# Patient Record
Sex: Male | Born: 1955
Health system: Southern US, Community
[De-identification: ages and names within clinical notes are randomized; demographics above are authoritative.]

## PROBLEM LIST (undated history)

## (undated) DIAGNOSIS — I5042 Chronic combined systolic (congestive) and diastolic (congestive) heart failure: Secondary | ICD-10-CM

## (undated) DIAGNOSIS — K259 Gastric ulcer, unspecified as acute or chronic, without hemorrhage or perforation: Secondary | ICD-10-CM

## (undated) DIAGNOSIS — Z9861 Coronary angioplasty status: Secondary | ICD-10-CM

## (undated) DIAGNOSIS — I255 Ischemic cardiomyopathy: Secondary | ICD-10-CM

## (undated) DIAGNOSIS — I219 Acute myocardial infarction, unspecified: Secondary | ICD-10-CM

## (undated) DIAGNOSIS — E7800 Pure hypercholesterolemia, unspecified: Secondary | ICD-10-CM

## (undated) DIAGNOSIS — R12 Heartburn: Secondary | ICD-10-CM

## (undated) DIAGNOSIS — J449 Chronic obstructive pulmonary disease, unspecified: Secondary | ICD-10-CM

## (undated) DIAGNOSIS — I2119 ST elevation (STEMI) myocardial infarction involving other coronary artery of inferior wall: Secondary | ICD-10-CM

## (undated) DIAGNOSIS — Z720 Tobacco use: Secondary | ICD-10-CM

## (undated) DIAGNOSIS — I4891 Unspecified atrial fibrillation: Principal | ICD-10-CM

## (undated) DIAGNOSIS — F419 Anxiety disorder, unspecified: Secondary | ICD-10-CM

## (undated) DIAGNOSIS — I2510 Atherosclerotic heart disease of native coronary artery without angina pectoris: Secondary | ICD-10-CM

## (undated) DIAGNOSIS — K635 Polyp of colon: Secondary | ICD-10-CM

## (undated) DIAGNOSIS — J45909 Unspecified asthma, uncomplicated: Secondary | ICD-10-CM

## (undated) DIAGNOSIS — I10 Essential (primary) hypertension: Secondary | ICD-10-CM

## (undated) DIAGNOSIS — I519 Heart disease, unspecified: Secondary | ICD-10-CM

## (undated) DIAGNOSIS — E785 Hyperlipidemia, unspecified: Secondary | ICD-10-CM

## (undated) HISTORY — DX: Pure hypercholesterolemia, unspecified: E78.00

## (undated) HISTORY — DX: Unspecified asthma, uncomplicated: J45.909

## (undated) HISTORY — DX: Essential (primary) hypertension: I10

## (undated) HISTORY — DX: Anxiety disorder, unspecified: F41.9

## (undated) HISTORY — PX: HERNIA REPAIR: SHX51

## (undated) HISTORY — DX: Coronary angioplasty status: Z98.61

## (undated) HISTORY — DX: Unspecified atrial fibrillation: I48.91

## (undated) HISTORY — DX: Heartburn: R12

## (undated) HISTORY — DX: Heart disease, unspecified: I51.9

## (undated) HISTORY — DX: Chronic combined systolic (congestive) and diastolic (congestive) heart failure: I50.42

## (undated) HISTORY — PX: TOE SURGERY: SHX1073

## (undated) HISTORY — DX: ST elevation (STEMI) myocardial infarction involving other coronary artery of inferior wall: I21.19

## (undated) HISTORY — DX: Hyperlipidemia, unspecified: E78.5

## (undated) HISTORY — PX: OTHER SURGICAL HISTORY: SHX169

## (undated) HISTORY — DX: Atherosclerotic heart disease of native coronary artery without angina pectoris: I25.10

## (undated) HISTORY — DX: Gastric ulcer, unspecified as acute or chronic, without hemorrhage or perforation: K25.9

## (undated) HISTORY — DX: Acute myocardial infarction, unspecified: I21.9

## (undated) HISTORY — DX: Ischemic cardiomyopathy: I25.5

## (undated) HISTORY — PX: TONSILLECTOMY: SUR1361

## (undated) HISTORY — DX: Polyp of colon: K63.5

---

## 2011-06-08 ENCOUNTER — Ambulatory Visit (HOSPITAL_COMMUNITY)
Admission: RE | Admit: 2011-06-08 | Discharge: 2011-06-08 | Disposition: A | Discharge status: Home / Self Care | Payer: BC Managed Care – PPO | Source: Ambulatory Visit | Attending: Family Medicine | Admitting: Family Medicine

## 2011-06-08 ENCOUNTER — Other Ambulatory Visit (HOSPITAL_COMMUNITY): Payer: Self-pay | Admitting: Family Medicine

## 2011-06-08 DIAGNOSIS — J441 Chronic obstructive pulmonary disease with (acute) exacerbation: Secondary | ICD-10-CM

## 2011-09-18 ENCOUNTER — Ambulatory Visit (HOSPITAL_COMMUNITY)
Admission: RE | Admit: 2011-09-18 | Discharge: 2011-09-18 | Disposition: A | Discharge status: Home / Self Care | Payer: BC Managed Care – PPO | Source: Ambulatory Visit | Attending: Family Medicine | Admitting: Family Medicine

## 2011-09-18 ENCOUNTER — Other Ambulatory Visit (HOSPITAL_COMMUNITY): Payer: Self-pay | Admitting: Family Medicine

## 2011-09-18 DIAGNOSIS — J069 Acute upper respiratory infection, unspecified: Secondary | ICD-10-CM

## 2011-09-18 DIAGNOSIS — R05 Cough: Secondary | ICD-10-CM | POA: Insufficient documentation

## 2011-09-18 DIAGNOSIS — R0989 Other specified symptoms and signs involving the circulatory and respiratory systems: Secondary | ICD-10-CM | POA: Insufficient documentation

## 2011-09-18 DIAGNOSIS — R059 Cough, unspecified: Secondary | ICD-10-CM | POA: Insufficient documentation

## 2011-09-18 DIAGNOSIS — J439 Emphysema, unspecified: Secondary | ICD-10-CM | POA: Insufficient documentation

## 2012-11-17 ENCOUNTER — Ambulatory Visit (HOSPITAL_COMMUNITY)
Admission: EM | Admit: 2012-11-17 | Payer: BC Managed Care – PPO | Source: Ambulatory Visit | Admitting: Cardiovascular Disease

## 2012-11-17 ENCOUNTER — Encounter (HOSPITAL_COMMUNITY): Payer: Self-pay | Admitting: Physician Assistant

## 2012-11-17 ENCOUNTER — Inpatient Hospital Stay (HOSPITAL_COMMUNITY): Payer: BC Managed Care – PPO

## 2012-11-17 ENCOUNTER — Inpatient Hospital Stay (HOSPITAL_COMMUNITY)
Admission: EM | Admit: 2012-11-17 | Discharge: 2012-11-22 | DRG: 248 | Disposition: A | Discharge status: Home / Self Care | Payer: BC Managed Care – PPO | Source: Ambulatory Visit | Attending: Cardiology | Admitting: Cardiology

## 2012-11-17 ENCOUNTER — Encounter (HOSPITAL_COMMUNITY)
Admission: EM | Disposition: A | Discharge status: Home / Self Care | Payer: Self-pay | Source: Ambulatory Visit | Attending: Cardiology

## 2012-11-17 DIAGNOSIS — I219 Acute myocardial infarction, unspecified: Secondary | ICD-10-CM

## 2012-11-17 DIAGNOSIS — I2119 ST elevation (STEMI) myocardial infarction involving other coronary artery of inferior wall: Principal | ICD-10-CM | POA: Diagnosis present

## 2012-11-17 DIAGNOSIS — I5042 Chronic combined systolic (congestive) and diastolic (congestive) heart failure: Secondary | ICD-10-CM

## 2012-11-17 DIAGNOSIS — I4892 Unspecified atrial flutter: Secondary | ICD-10-CM | POA: Diagnosis not present

## 2012-11-17 DIAGNOSIS — Z87891 Personal history of nicotine dependence: Secondary | ICD-10-CM | POA: Diagnosis present

## 2012-11-17 DIAGNOSIS — Z7982 Long term (current) use of aspirin: Secondary | ICD-10-CM

## 2012-11-17 DIAGNOSIS — R570 Cardiogenic shock: Secondary | ICD-10-CM | POA: Diagnosis present

## 2012-11-17 DIAGNOSIS — I252 Old myocardial infarction: Secondary | ICD-10-CM

## 2012-11-17 DIAGNOSIS — Z23 Encounter for immunization: Secondary | ICD-10-CM

## 2012-11-17 DIAGNOSIS — I4891 Unspecified atrial fibrillation: Secondary | ICD-10-CM | POA: Diagnosis not present

## 2012-11-17 DIAGNOSIS — D72829 Elevated white blood cell count, unspecified: Secondary | ICD-10-CM | POA: Diagnosis present

## 2012-11-17 DIAGNOSIS — F17200 Nicotine dependence, unspecified, uncomplicated: Secondary | ICD-10-CM | POA: Diagnosis present

## 2012-11-17 DIAGNOSIS — Z79899 Other long term (current) drug therapy: Secondary | ICD-10-CM

## 2012-11-17 DIAGNOSIS — I5032 Chronic diastolic (congestive) heart failure: Secondary | ICD-10-CM | POA: Insufficient documentation

## 2012-11-17 DIAGNOSIS — I2510 Atherosclerotic heart disease of native coronary artery without angina pectoris: Secondary | ICD-10-CM | POA: Diagnosis present

## 2012-11-17 DIAGNOSIS — F17210 Nicotine dependence, cigarettes, uncomplicated: Secondary | ICD-10-CM

## 2012-11-17 DIAGNOSIS — J189 Pneumonia, unspecified organism: Secondary | ICD-10-CM | POA: Diagnosis present

## 2012-11-17 DIAGNOSIS — E1169 Type 2 diabetes mellitus with other specified complication: Secondary | ICD-10-CM | POA: Diagnosis present

## 2012-11-17 DIAGNOSIS — I480 Paroxysmal atrial fibrillation: Secondary | ICD-10-CM | POA: Diagnosis not present

## 2012-11-17 DIAGNOSIS — J441 Chronic obstructive pulmonary disease with (acute) exacerbation: Secondary | ICD-10-CM | POA: Diagnosis present

## 2012-11-17 DIAGNOSIS — J449 Chronic obstructive pulmonary disease, unspecified: Secondary | ICD-10-CM | POA: Diagnosis present

## 2012-11-17 DIAGNOSIS — E785 Hyperlipidemia, unspecified: Secondary | ICD-10-CM | POA: Diagnosis present

## 2012-11-17 HISTORY — DX: Chronic combined systolic (congestive) and diastolic (congestive) heart failure: I50.42

## 2012-11-17 HISTORY — PX: CORONARY ANGIOPLASTY WITH STENT PLACEMENT: SHX49

## 2012-11-17 HISTORY — DX: ST elevation (STEMI) myocardial infarction involving other coronary artery of inferior wall: I21.19

## 2012-11-17 HISTORY — DX: Chronic obstructive pulmonary disease, unspecified: J44.9

## 2012-11-17 HISTORY — PX: LEFT HEART CATHETERIZATION WITH CORONARY ANGIOGRAM: SHX5451

## 2012-11-17 HISTORY — DX: Tobacco use: Z72.0

## 2012-11-17 LAB — POCT I-STAT, CHEM 8
BUN: 13 mg/dL (ref 6–23)
Calcium, Ion: 1.03 mmol/L — ABNORMAL LOW (ref 1.12–1.23)
Chloride: 81 mEq/L — ABNORMAL LOW (ref 96–112)
Creatinine, Ser: 0.5 mg/dL (ref 0.50–1.35)
Glucose, Bld: 73 mg/dL (ref 70–99)
HCT: 39 % (ref 39.0–52.0)
Potassium: 3.4 mEq/L — ABNORMAL LOW (ref 3.5–5.1)

## 2012-11-17 LAB — COMPREHENSIVE METABOLIC PANEL
ALT: 26 U/L (ref 0–53)
AST: 102 U/L — ABNORMAL HIGH (ref 0–37)
Albumin: 2.5 g/dL — ABNORMAL LOW (ref 3.5–5.2)
Alkaline Phosphatase: 113 U/L (ref 39–117)
BUN: 14 mg/dL (ref 6–23)
CO2: 27 mEq/L (ref 19–32)
Chloride: 94 mEq/L — ABNORMAL LOW (ref 96–112)
Creatinine, Ser: 0.72 mg/dL (ref 0.50–1.35)
Potassium: 3.7 mEq/L (ref 3.5–5.1)
Sodium: 135 mEq/L (ref 135–145)
Total Bilirubin: 0.4 mg/dL (ref 0.3–1.2)

## 2012-11-17 LAB — CBC WITH DIFFERENTIAL/PLATELET
Basophils Relative: 0 % (ref 0–1)
Lymphs Abs: 0.9 10*3/uL (ref 0.7–4.0)
MCH: 30.1 pg (ref 26.0–34.0)
MCHC: 33.6 g/dL (ref 30.0–36.0)
MCV: 89.7 fL (ref 78.0–100.0)
Monocytes Absolute: 0.7 10*3/uL (ref 0.1–1.0)
Monocytes Relative: 6 % (ref 3–12)
Neutro Abs: 9.2 10*3/uL — ABNORMAL HIGH (ref 1.7–7.7)
Platelets: 391 10*3/uL (ref 150–400)
RDW: 13.5 % (ref 11.5–15.5)
WBC: 10.8 10*3/uL — ABNORMAL HIGH (ref 4.0–10.5)

## 2012-11-17 LAB — PROTIME-INR: Prothrombin Time: 17.3 seconds — ABNORMAL HIGH (ref 11.6–15.2)

## 2012-11-17 LAB — BASIC METABOLIC PANEL
BUN: 14 mg/dL (ref 6–23)
CO2: 27 mEq/L (ref 19–32)
Chloride: 93 mEq/L — ABNORMAL LOW (ref 96–112)
Glucose, Bld: 97 mg/dL (ref 70–99)
Potassium: 4.2 mEq/L (ref 3.5–5.1)
Sodium: 131 mEq/L — ABNORMAL LOW (ref 135–145)

## 2012-11-17 LAB — HEMOGLOBIN A1C
Hgb A1c MFr Bld: 6.7 % — ABNORMAL HIGH (ref <5.7)
Mean Plasma Glucose: 146 mg/dL — ABNORMAL HIGH (ref <117)

## 2012-11-17 LAB — MRSA PCR SCREENING: MRSA by PCR: NEGATIVE

## 2012-11-17 LAB — PRO B NATRIURETIC PEPTIDE: Pro B Natriuretic peptide (BNP): 718.1 pg/mL — ABNORMAL HIGH (ref 0–125)

## 2012-11-17 SURGERY — LEFT HEART CATHETERIZATION WITH CORONARY ANGIOGRAM
Anesthesia: LOCAL

## 2012-11-17 MED ORDER — SODIUM CHLORIDE 0.9 % IJ SOLN: 3.0000 mL | INTRAMUSCULAR | Status: DC | PRN

## 2012-11-17 MED ORDER — SODIUM CHLORIDE 0.9 % IV SOLN: 250.0000 mL | INTRAVENOUS | Status: DC | PRN

## 2012-11-17 MED ORDER — ACETAMINOPHEN 325 MG PO TABS: 650.0000 mg | ORAL_TABLET | ORAL | Status: DC | PRN

## 2012-11-17 MED ORDER — ONDANSETRON HCL 4 MG/2ML IJ SOLN: 4.0000 mg | Freq: Four times a day (QID) | INTRAMUSCULAR | Status: DC | PRN

## 2012-11-17 MED ORDER — TIOTROPIUM BROMIDE MONOHYDRATE 18 MCG IN CAPS
18.0000 ug | ORAL_CAPSULE | Freq: Every day | RESPIRATORY_TRACT | Status: DC
  Filled 2012-11-17: qty 5

## 2012-11-17 MED ORDER — ALPRAZOLAM 0.25 MG PO TABS
0.2500 mg | ORAL_TABLET | Freq: Two times a day (BID) | ORAL | Status: DC | PRN
  Administered 2012-11-17: 0.25 mg via ORAL
  Filled 2012-11-17: qty 1

## 2012-11-17 MED ORDER — ENOXAPARIN SODIUM 40 MG/0.4ML ~~LOC~~ SOLN
40.0000 mg | SUBCUTANEOUS | Status: DC
Start: 2012-11-18 — End: 2012-11-19
  Administered 2012-11-18 – 2012-11-19 (×2): 40 mg via SUBCUTANEOUS
  Filled 2012-11-17 (×3): qty 0.4

## 2012-11-17 MED ORDER — ASPIRIN EC 81 MG PO TBEC
81.0000 mg | DELAYED_RELEASE_TABLET | Freq: Every day | ORAL | Status: DC
  Administered 2012-11-19 – 2012-11-22 (×4): 81 mg via ORAL
  Filled 2012-11-17 (×6): qty 1

## 2012-11-17 MED ORDER — SODIUM CHLORIDE 0.9 % IV SOLN
INTRAVENOUS | Status: AC
  Administered 2012-11-17: 18:00:00 via INTRAVENOUS

## 2012-11-17 MED ORDER — FUROSEMIDE 10 MG/ML IJ SOLN
40.0000 mg | Freq: Once | INTRAMUSCULAR | Status: DC
  Filled 2012-11-17: qty 4

## 2012-11-17 MED ORDER — INFLUENZA VAC SPLIT QUAD 0.5 ML IM SUSP
0.5000 mL | INTRAMUSCULAR | Status: AC
  Filled 2012-11-17: qty 0.5

## 2012-11-17 MED ORDER — EPTIFIBATIDE 75 MG/100ML IV SOLN
INTRAVENOUS | Status: AC
  Filled 2012-11-17: qty 100

## 2012-11-17 MED ORDER — NITROGLYCERIN 0.4 MG SL SUBL: 0.4000 mg | SUBLINGUAL_TABLET | SUBLINGUAL | Status: DC | PRN

## 2012-11-17 MED ORDER — ACETAMINOPHEN 325 MG PO TABS
650.0000 mg | ORAL_TABLET | ORAL | Status: DC | PRN
  Administered 2012-11-18 – 2012-11-20 (×2): 650 mg via ORAL
  Filled 2012-11-17 (×2): qty 2

## 2012-11-17 MED ORDER — SODIUM CHLORIDE 0.9 % IJ SOLN
3.0000 mL | Freq: Two times a day (BID) | INTRAMUSCULAR | Status: DC
  Administered 2012-11-17 – 2012-11-22 (×9): 3 mL via INTRAVENOUS

## 2012-11-17 MED ORDER — NICOTINE 14 MG/24HR TD PT24
14.0000 mg | MEDICATED_PATCH | Freq: Every day | TRANSDERMAL | Status: DC
  Administered 2012-11-17 – 2012-11-22 (×6): 14 mg via TRANSDERMAL
  Filled 2012-11-17 (×6): qty 1

## 2012-11-17 MED ORDER — METOPROLOL TARTRATE 25 MG PO TABS
25.0000 mg | ORAL_TABLET | Freq: Two times a day (BID) | ORAL | Status: DC
  Administered 2012-11-17 – 2012-11-18 (×3): 25 mg via ORAL
  Filled 2012-11-17 (×5): qty 1

## 2012-11-17 MED ORDER — EPTIFIBATIDE 75 MG/100ML IV SOLN
2.0000 ug/kg/min | INTRAVENOUS | Status: AC
  Administered 2012-11-17 – 2012-11-18 (×3): 2 ug/kg/min via INTRAVENOUS
  Filled 2012-11-17 (×6): qty 100

## 2012-11-17 MED ORDER — GUAIFENESIN ER 600 MG PO TB12
600.0000 mg | ORAL_TABLET | Freq: Two times a day (BID) | ORAL | Status: DC
  Administered 2012-11-17: 600 mg via ORAL
  Filled 2012-11-17: qty 1

## 2012-11-17 MED ORDER — ZOLPIDEM TARTRATE 5 MG PO TABS
5.0000 mg | ORAL_TABLET | Freq: Every evening | ORAL | Status: DC | PRN
  Administered 2012-11-18: 5 mg via ORAL
  Filled 2012-11-17: qty 1

## 2012-11-17 MED ORDER — ALBUTEROL SULFATE HFA 108 (90 BASE) MCG/ACT IN AERS
2.0000 | INHALATION_SPRAY | Freq: Four times a day (QID) | RESPIRATORY_TRACT | Status: DC | PRN
  Administered 2012-11-17 – 2012-11-18 (×3): 2 via RESPIRATORY_TRACT
  Filled 2012-11-17: qty 6.7

## 2012-11-17 MED ORDER — ATORVASTATIN CALCIUM 80 MG PO TABS
80.0000 mg | ORAL_TABLET | Freq: Every day | ORAL | Status: DC
  Administered 2012-11-17 – 2012-11-18 (×2): 80 mg via ORAL
  Filled 2012-11-17 (×3): qty 1

## 2012-11-17 MED ORDER — LISINOPRIL 5 MG PO TABS
5.0000 mg | ORAL_TABLET | Freq: Every day | ORAL | Status: DC
  Administered 2012-11-17: 5 mg via ORAL
  Filled 2012-11-17 (×2): qty 1

## 2012-11-17 MED ORDER — FUROSEMIDE 10 MG/ML IJ SOLN
40.0000 mg | Freq: Once | INTRAMUSCULAR | Status: AC
  Administered 2012-11-17: 40 mg via INTRAVENOUS

## 2012-11-17 MED ORDER — SODIUM CHLORIDE 0.9 % IJ SOLN: 3.0000 mL | Freq: Two times a day (BID) | INTRAMUSCULAR | Status: DC

## 2012-11-17 MED ORDER — ISOSORBIDE MONONITRATE ER 30 MG PO TB24
30.0000 mg | ORAL_TABLET | Freq: Every day | ORAL | Status: DC
  Administered 2012-11-17 – 2012-11-18 (×2): 30 mg via ORAL
  Filled 2012-11-17 (×3): qty 1

## 2012-11-17 MED ORDER — BUDESONIDE-FORMOTEROL FUMARATE 160-4.5 MCG/ACT IN AERO
2.0000 | INHALATION_SPRAY | Freq: Two times a day (BID) | RESPIRATORY_TRACT | Status: DC
  Administered 2012-11-17 – 2012-11-19 (×4): 2 via RESPIRATORY_TRACT
  Filled 2012-11-17: qty 6

## 2012-11-17 MED ORDER — MORPHINE SULFATE 2 MG/ML IJ SOLN: 2.0000 mg | INTRAMUSCULAR | Status: DC | PRN

## 2012-11-17 NOTE — Care Management Note (Addendum)
    Page 1 of 1   11/21/2012     11:29:22 AM   CARE MANAGEMENT NOTE 11/21/2012  Patient:  Benjamin [name], Benjamin [name]   Account Number:  [id number]  Date Initiated:  11/17/2012  Documentation initiated by:  Junius Creamer  Subjective/Objective Assessment:   adm w mi     Action/Plan:   lives alone, pcp dr Lyman Bishop fusco   Anticipated DC Date:  11/22/2012   Anticipated DC Plan:  HOME/SELF CARE      DC Planning Services  CM consult  Medication Assistance      Choice offered to / List presented to:     DME arranged  [facility]           Status of service:   Medicare Important Message given?   (If response is "NO", the following Medicare IM given date fields will be blank) Date Medicare IM given:   Date Additional Medicare IM given:    Discharge Disposition:  HOME/SELF CARE  Per UR Regulation:  Reviewed for med. necessity/level of care/duration of stay  If discussed at Long Length of Stay Meetings, dates discussed:    Comments:  10/24  0942 debbie Jeraldin Fesler rn,bsn pt for poss dc on 10-25. md charted may need eliquis. gave pt eliquis 30day and copay assist card if needed. has concentrator and 1 port tank. he gets oxygen from [facility]. he has not been using o2 24hrs per day. will need order for o2 at 24hrs/day and oxygen conserving device so he can get smaller tanks for mobility.eliquis and brilinta have 70.00 per month copay but pt has copay assist card for both that may bring down.  10/21 0955 debbie Annmarie Plemmons rn,bsn spoke w pt and da. pt has [facility] ins. gave pt 30day free and copay assist card for brilinta.

## 2012-11-17 NOTE — H&P (Signed)
History and Physical   Patient ID: Benjamin [name] MRN: [id number], DOB/AGE: 1955/12/17 57 y.o. Date of Encounter: 11/17/2012  Primary Physician: Benjamin [name]., MD Primary Cardiologist: New, will follow up in [place]  Chief Complaint:  STEMI  HPI: Benjamin [name] is a 57 y.o. male with no history of CAD. He had onset of substernal chest pain yesterday morning about 11 AM. The pain would come and go but he generally had it most time. It was associated with some shortness of breath and nausea but no vomiting or diaphoresis. He reached a 5/10. He was not able to sleep last night because the pain kept waking him. He did not take any medications that helped the pain.  Today, the pain was persistent and severe so he went to his family physician. His ECG was consistent with an inferior ST elevation MI. EMS was called and he was transported to [facility] and taken directly to the Cath Lab. He was given aspirin 81 mg x4 and sublingual nitroglycerin times one at the doctor's office with improvement in his pain. Per EMS, his pain would worsen and they would give him additional mitral glycerin and it would improve. Upon arrival to the cath lab, he is still having discomfort at a 1 or 2/10. His ECG is still abnormal.   Past Medical History  Diagnosis Date  . Tobacco abuse   . COPD (chronic obstructive pulmonary disease)     Surgical History:  Past Surgical History  Procedure Laterality Date  . Dental extractions       I have reviewed the patient's current medications. Prior to Admission medications   Not on File    Allergies:  Allergies  Allergen Reactions  . Codeine   . Penicillins     History   Social History  . Marital Status: Divorced    Spouse Name: N/A    Number of Children: N/A  . Years of Education: N/A   Occupational History  . Disabled secondary to COPD    Social History Main Topics  . Smoking status: Current Every Day Smoker -- 1.00 packs/day for 40  years  . Smokeless tobacco: Never Used  . Alcohol Use: No  . Drug Use: No  . Sexual Activity: Not on file   Other Topics Concern  . Not on file   Social History Narrative   Lives alone    Family Status  Relation Status Death Age  . Mother Deceased 38    History of CAD  . Father Deceased 44    History of CAD    Review of Systems: No bleeding history no recent CVA or melena. He coughs and wheezes daily. He had a recent upper respiratory infection but did not require antibiotics. The upper respiratory infection has improved. He has not been having fevers or chills.  Full 14-point review of systems otherwise negative except as noted above.  Physical Exam: General: Well developed, well nourished,male in acute distress. Head: Normocephalic, atraumatic, sclera non-icteric, no xanthomas, nares are without discharge. Dentition: Edentulous Neck: No carotid bruits. JVD not elevated. No thyromegally Lungs: Good expansion bilaterally. with wheezes and a few rhonchi. Some rales Heart: Regular rate and rhythm with S1 S2.  No S3 or S4.  No murmur, no rubs, or gallops appreciated. Abdomen: Soft, non-tender, non-distended with normoactive bowel sounds. No hepatomegaly. No rebound/guarding. No obvious abdominal masses. Msk:  Strength and tone appear normal for age. No joint deformities or effusions, no spine or costo-vertebral angle tenderness.  Extremities: No clubbing or cyanosis. No edema.  Distal pedal pulses are 2+ in 4 extrem Neuro: Alert and oriented X 3. Moves all extremities spontaneously. No focal deficits noted. Psych:  Responds to questions appropriately with a normal affect. Skin: No rashes or lesions noted  Labs: Pending  Radiology/Studies: Pending  ECG: Sinus rhythm with 2-3 mm of ST elevation in the inferior leads and reciprocal anterior changes.  ASSESSMENT AND PLAN:  Principal Problem:   ST elevation myocardial infarction (STEMI) of inferior wall, initial episode of care -  emergent cardiac catheterization is indicated with further evaluation and treatment depending on the results. He will be continued on his home medications and screen for cardiac risk factors. He will be counseled on smoking cessation we'll add a nicotine patch.  Active Problems:   COPD (chronic obstructive pulmonary disease)  Signed, Benjamin Demark, PA-C 11/17/2012 2:36 PM Beeper [phone]  I saw & examined the patient along with Ms. Raford Pitcher, PA-C.   I agree - Inferior STEMI - pain onselt yesterday @ ~11 AM , but with persistent Sx -- seen by PCP (Benjamin [name]) this AM & referred for acute cardiac catheterization for Inf STEMI.  ECG indeed showed ~2 mm Inf & Lateral STE --> Emergent cardiac cath.  Benjamin [name], M.D., M.S. [facility] [address] [place], [place]  [address]  [phone] Pager # [phone] 11/17/2012 4:04 PM

## 2012-11-17 NOTE — Interval H&P Note (Signed)
History and Physical Interval Note:  11/17/2012 4:05 PM  Aldean Pipe  has presented today for surgery, with the diagnosis of Chest pain  The various methods of treatment have been discussed with the patient and family. After consideration of risks, benefits and other options for treatment, the patient has consented to  Procedure(s): LEFT HEART CATHETERIZATION WITH CORONARY ANGIOGRAM (N/A) & PCI as a surgical intervention .  The patient's history has been reviewed, patient examined, no change in status, stable for surgery.  I have reviewed the patient's chart and labs.  Questions were answered to the patient's satisfaction.     Lashawna Poche W Cath Lab Visit (complete for each Cath Lab visit)  Clinical Evaluation Leading to the Procedure:   ACS: yes  Non-ACS:    Anginal Classification: CCS IV  Anti-ischemic medical therapy: No Therapy  Non-Invasive Test Results: No non-invasive testing performed  Prior CABG: No previous CABG

## 2012-11-17 NOTE — CV Procedure (Addendum)
CARDIAC CATHETERIZATION AND PERCUTANEOUS CORONARY INTERVENTION REPORT  NAME:  Benjamin [name]   MRN: [id number] DOB:  03-17-1955   ADMIT DATE: 11/17/2012 Procedure Date: 11/17/2012  INTERVENTIONAL CARDIOLOGIST: Marykay Lex, M.D., MS PRIMARY CARE PROVIDER: Cassell Smiles., MD PRIMARY CARDIOLOGIST:  PATIENT:  Benjamin [name] is a 57 y.o. male  with no history of CAD. He had onset of substernal chest pain yesterday morning about 11 AM. The pain would come and go but he generally had it most time. It was associated with some shortness of breath and nausea but no vomiting or diaphoresis. He reached a 5/10. He was not able to sleep last night because the pain kept waking him. He did not take any medications that helped the pain.  Today, the pain was persistent and severe so he went to his family physician. His ECG was consistent with an inferior ST elevation MI. EMS was called and he was transported to [facility] and taken directly to the Cath Lab. He was given aspirin 81 mg x4 and sublingual nitroglycerin times one at the doctor's office with improvement in his pain. Per EMS, his pain would worsen and they would give him additional mitral glycerin and it would improve. Upon arrival to the cath lab, he is still having discomfort at a 1 or 2/10. His ECG is still abnormal - ~1-2 mm STE in II, III, aVF & V5-V6. He was brought directly to the cardiac cath lab for emergent cath/PCI.  PRE-OPERATIVE DIAGNOSIS:    Inferior STEMI  PROCEDURES PERFORMED:    LEFT HEART CATHETERIZATION WITH CORONARY ANGIOGRAPHY  ASPIRATION THROMBECTOMY OF RCA  PERCUTANEOUS CORONARY INTERVENTION OF THE THROMBOTICALY-SUBTOTAL RCA OCCLUSION  PERCUTANEOUS CORONARY ANGIOPLASTY OF THE RIGHT POSTERIOR AV GROOVE BRANCH   PROCEDURE:Consent:  Risks of procedure as well as the alternatives and risks of each were explained to the (patient/caregiver).   Verbal consent for procedure obtained as we were unable to obtain written  consent because of emergent medical necessity.  PROCEDURE: The patient was brought to the 2nd Floor  Cardiac Catheterization Lab in the fasting state and prepped and draped in the usual sterile fashion for Right groin or radial access. A modified Allen's test with plethysmography was performed, revealing excellent Ulnar artery collateral flow.  Sterile technique was used including antiseptics, cap, gloves, gown, hand hygiene, mask and sheet.  Skin prep: Chlorhexidine.  Time Out: Verified patient identification, verified procedure, site/side was marked, verified correct patient position, special equipment/implants available, medications/allergies/relevent history reviewed, required imaging and test results available.  Performed  Access:   Initial to attempt were made at right radial artery access, however despite having excellent flow the Angiocath, the wire would not thread successfully.  Aspiration of the catheter showed thrombus in the catheter.  Therefore, radial approach was aborted and a TR band placed and there was a small hematoma forming.    Right Common Femoral Artery: 6 Fr Sheath -- fluoroscopic guided modified Seldinger technique Diagnostic:  5 [place] JL 4, 6 [place] JR 4 Guide, 5 French Angled Pigtail)  Left Coronary Artery Angiography: JL4  Right Coronary Artery Angiography: JR 4 guide  LV Hemodynamics (LV Gram): Angled pigtail  6 French RFA sheath:  Removed with arteriotomy closure using Perclose closure device, with the assistance of Dr. Tonny Bollman, excellent hemostasis obtained.  MEDICATIONS:  Anesthesia:  Local Lidocaine 18 ml  Sedation:  2 mg IV Versed, 150 mcg IV fentanyl ;   Omnipaque Contrast: 185 ml  Anticoagulation:  Angiomax Bolus & drip  Anti-Platelet Agent:  Brilinta 180 mg, Integrilin single bolus with drip  Hemodynamics:  Central Aortic / Mean Pressures: 115/91 mmHg; 103 mmHg  Left Ventricular Pressures / EDP: 120/19 mmHg; 30 mmHg  Left  Ventriculography:  EF: 50-55 %  Wall Motion: Moderate Basal and Apical Inferior hypokinesia  Coronary Anatomy:  Left Main: Very Large-caliber that bifurcates distally into the LAD, and Circumflex; angiographically normal LAD: Large caliber vessel that gives off a large bifurcating diagonal branch.  The vessel itself is very tortuous as it reaches down around the apex.  Angiographically normal  D1: Large caliber vessel that has 2 major branches.  This vessel is at least as big as the LAD; angiographically normal  Left Circumflex: Large-caliber, nondominant vessel which essentially gives off a major OM1 with a small AV groove circumflex terminates as 2 small distal OM branches.  OM1: Moderate large-caliber vessel it reaches down along the inferolateral wall, angiographically normal.   RCA: Very large (5 a half to 6 mm vessel) with a proximal and then stenosis of roughly 50% all by a 40% lesion.  Then in the mid band there is in extensive thrombotic appearing subtotal occlusion of 90+ percent stenosis, but TIMI 3 flow reaching down into the distal RCA system.  Downstream the vessel is essentially normal continues as a very large caliber vessel that bifurcates into the Right Posterior Descending Artery branch RPDA) and the  Right Posterior AV Groove Branch (RPAV).  Beyond the thrombotic occlusion, there does have the appearance of some distal embolization, but otherwise no significant lesions.  RPDA: At least a 4 mm vessel that reaches down to the apex.  No synovial lesions, however after completion of PCI there was some distal embolization at the very distal to the vessel.  RPL Sysytem:The RPAV large-caliber vessel that gives off one small or first RPL followed by a larger RPL that has 2 major branches.  The remainder the smaller branches these are least 2.5 mm  Vessels. -->  Post PCI there was distal embolization with some dye hangup in the downstream vessels suggestive of some distal slow  reflow.  Angiographic images demonstrated clearly the culprit lesion in the mid RCA.  Plan to then made to proceed with intervention.  Due to the significant thrombus involved, the first device used was a thrombectomy catheter.  Percutaneous Coronary Intervention:  Sheath exchanged for 6 Fr Guide: 6 Fr   JR 4 Guidewire: BMW Pronto Thrombectomy Catheter was advanced into the mid vessel and 2 passes were made that reduced the thrombus burden by about one half.  Predilation Balloon: Sprinter Legend 2.5 mm x 50 mm;   8 Atm x 33 Sec -- there was TIMI-3 flow downstream with significant reduction in the size of the stenosis.  Stent: VeriFlex BMS 5.0 mm x 24 mm;   Deployment: 12 Atm x 30 Sec,   Post dilation with stent balloon: 18 Atm x 60 Sec  Final Diameter: 5.65 mm  Post deployment angiography in multiple views, with and without guidewire in place revealed excellent stent deployment and lesion coverage.  There was no evidence of dissection or perforation.  However they did appear to be distal embolization into both branches, the RPDA and RPL system.  Initial attempts were made to pass the thrombectomy catheter down, but it would not successfully reach beyond the bifurcation therefore, the initial predilation balloon was used to dilate a portion of the RPAV that had the most significant area of thrombosis. Predilation Balloon: Sprinter Legend 2.5 mm  x 15 mm; 8 Atm x 33 Sec; 6 Atm x 30 Sec  At this point, there was still thrombus noted downstream the dye hangup, however due to being in all 3 distal branches, the decision was made to simply stent given Integrilin bolus and regarding the drip for 18 hours there were no lesions in the distal vessel prior to PCI, therefore this is all likely to be distal embolization.  The patient not having further pain therefore, the decision made to complete the case this point and run IV Integrilin overnight.  After completion of PCI the left ventriculogram was  performed.  This is followed by a brief nonselective Right Common Femoral angiogram that demonstrated excellent location for arteriotomy closure.  Dr. Tonny Bollman graciously agreed to scrub in to help completion of this procedure.  The groin was then reprepped in sterile fashion.  New sterile gloves were donned.  The arteriotomy site was then closed with the sheath being removed using a 6 French Perclose closure device.  5 minutes manual pressure was held, and excellent hemostasis was obtained.    PATIENT DISPOSITION:    The patient was transferred to the CCU holding area in a hemodynamicaly stable, chest pain free condition.  The patient tolerated the procedure well, and there were no complications.  EBL:   < 20 ml  The patient was stable before, during, and after the procedure.  POST-OPERATIVE DIAGNOSIS:    Essentially subtotal, thrombotic occlusion of the at mid RCA with a culprit lesion, treated with a very large 5 mm x 24 mm BMS stent postdilated to 5.6 mm.  Distal embolization of thrombus from the mid vessel into the distal/terminal vessels but no ongoing chest discomfort  Low normal EF with elevated EDP  Otherwise normal coronary angiography besides the proximal RCA moderate lesion.  PLAN OF CARE:  Admit to ICU for standard post STEMI/PCI care with plan to run Integrilin for 18 hours.  Given the elevated LVEDP, I will diuresis once he is able to sit up post Perclose  LAD afterload reduction with ACE inhibitor and nitrate.  Also add low-dose beta blocker (beta-1 selective)  Simply based on the level of the patient's COPD, do not anticipate that he'll be a good fast-track candidate.   Marykay Lex, M.D., M.S. [facility] [address] [place], [place]  [address]  [phone]  11/17/2012 4:01 PM

## 2012-11-17 NOTE — Progress Notes (Signed)
Called received from Dr. Allyson Sabal, previous shift RN had paged to notify of Trop >20. Dr Allyson Sabal made aware. No new orders received will continue to monitor closely.

## 2012-11-18 ENCOUNTER — Inpatient Hospital Stay (HOSPITAL_COMMUNITY): Payer: BC Managed Care – PPO

## 2012-11-18 DIAGNOSIS — I2510 Atherosclerotic heart disease of native coronary artery without angina pectoris: Secondary | ICD-10-CM

## 2012-11-18 DIAGNOSIS — J189 Pneumonia, unspecified organism: Secondary | ICD-10-CM | POA: Diagnosis present

## 2012-11-18 HISTORY — PX: TRANSTHORACIC ECHOCARDIOGRAM: SHX275

## 2012-11-18 HISTORY — DX: Atherosclerotic heart disease of native coronary artery without angina pectoris: I25.10

## 2012-11-18 LAB — CBC
HCT: 39.6 % (ref 39.0–52.0)
MCH: 30.2 pg (ref 26.0–34.0)
MCHC: 33.3 g/dL (ref 30.0–36.0)
MCV: 90.6 fL (ref 78.0–100.0)
Platelets: 423 10*3/uL — ABNORMAL HIGH (ref 150–400)
RDW: 13.8 % (ref 11.5–15.5)
WBC: 13.2 10*3/uL — ABNORMAL HIGH (ref 4.0–10.5)

## 2012-11-18 LAB — LIPID PANEL
Cholesterol: 128 mg/dL (ref 0–200)
HDL: 26 mg/dL — ABNORMAL LOW (ref >39)
LDL Cholesterol: 74 mg/dL (ref 0–99)
Triglycerides: 138 mg/dL (ref <150)

## 2012-11-18 LAB — TROPONIN I
Troponin I: >20 ng/mL (ref <0.30)
Troponin I: >20 ng/mL (ref <0.30)

## 2012-11-18 LAB — COMPREHENSIVE METABOLIC PANEL
AST: 162 U/L — ABNORMAL HIGH (ref 0–37)
Albumin: 2.2 g/dL — ABNORMAL LOW (ref 3.5–5.2)
BUN: 13 mg/dL (ref 6–23)
Calcium: 8.7 mg/dL (ref 8.4–10.5)
Creatinine, Ser: 0.73 mg/dL (ref 0.50–1.35)
Glucose, Bld: 103 mg/dL — ABNORMAL HIGH (ref 70–99)
Total Protein: 6.4 g/dL (ref 6.0–8.3)

## 2012-11-18 MED ORDER — TICAGRELOR 90 MG PO TABS
90.0000 mg | ORAL_TABLET | Freq: Two times a day (BID) | ORAL | Status: DC
  Administered 2012-11-18 – 2012-11-22 (×9): 90 mg via ORAL
  Filled 2012-11-18 (×10): qty 1

## 2012-11-18 MED ORDER — LEVOFLOXACIN IN D5W 750 MG/150ML IV SOLN: 750.0000 mg | INTRAVENOUS | Status: DC

## 2012-11-18 MED ORDER — LEVALBUTEROL HCL 0.63 MG/3ML IN NEBU
0.6300 mg | INHALATION_SOLUTION | Freq: Three times a day (TID) | RESPIRATORY_TRACT | Status: DC
  Administered 2012-11-18: 0.63 mg via RESPIRATORY_TRACT
  Filled 2012-11-18: qty 3

## 2012-11-18 MED ORDER — LEVALBUTEROL HCL 0.63 MG/3ML IN NEBU
0.6300 mg | INHALATION_SOLUTION | Freq: Four times a day (QID) | RESPIRATORY_TRACT | Status: DC | PRN
  Administered 2012-11-18: 0.63 mg via RESPIRATORY_TRACT
  Filled 2012-11-18: qty 3

## 2012-11-18 MED ORDER — PREDNISONE 50 MG PO TABS
60.0000 mg | ORAL_TABLET | Freq: Every day | ORAL | Status: AC
  Administered 2012-11-19 – 2012-11-21 (×3): 60 mg via ORAL
  Filled 2012-11-18 (×3): qty 1

## 2012-11-18 MED ORDER — ALPRAZOLAM 0.5 MG PO TABS
0.5000 mg | ORAL_TABLET | Freq: Two times a day (BID) | ORAL | Status: DC | PRN
  Administered 2012-11-18 – 2012-11-21 (×3): 0.5 mg via ORAL
  Filled 2012-11-18 (×3): qty 1

## 2012-11-18 MED ORDER — IPRATROPIUM BROMIDE 0.02 % IN SOLN
0.5000 mg | RESPIRATORY_TRACT | Status: DC | PRN
  Administered 2012-11-18 (×3): 0.5 mg via RESPIRATORY_TRACT
  Filled 2012-11-18 (×3): qty 2.5

## 2012-11-18 MED ORDER — GUAIFENESIN 100 MG/5ML PO SYRP
400.0000 mg | ORAL_SOLUTION | Freq: Three times a day (TID) | ORAL | Status: DC
  Administered 2012-11-18 – 2012-11-22 (×14): 400 mg via ORAL
  Filled 2012-11-18 (×16): qty 20

## 2012-11-18 MED ORDER — LEVOFLOXACIN IN D5W 750 MG/150ML IV SOLN
750.0000 mg | Freq: Every day | INTRAVENOUS | Status: AC
  Administered 2012-11-18 – 2012-11-19 (×2): 750 mg via INTRAVENOUS
  Filled 2012-11-18 (×2): qty 150

## 2012-11-18 NOTE — Consult Note (Signed)
[facility] Medical Consultation  Add Dinapoli YQM:[id number] DOB: 05/06/1955 DOA: 11/17/2012 PCP: Cassell Smiles., MD   Requesting physician: Dr Herbie Baltimore Date of consultation: 11/18/12 Reason for consultation: COPD   Impression/Recommendations Principal Problem:   ST elevation myocardial infarction (STEMI) of inferior wall, initial episode of care Active Problems:   COPD (chronic obstructive pulmonary disease)   Pneumonia    1. Mild copd exacerbation: scattered wheezing heard posteriorly. He is requiring oxygen 4 lit during daytime, will need to be on continuous oxygen. Will request case manager to arrange for continuous home oxygen. Stopped spiriva and started him on atrovent nebs. Continue with xopenex nebs and symbicort . Start him prednisone taper over the next 10 days.  He will need to stop smoking. He will need PFT'S as outpatient on discharge. He can go back to spiriva on discharge. He is planning to switch his pulmonologist to one in [place].  2. Right basilar infiltrate: Right sided pneumonia; on levaquin. Recommend checking another CXR in 2 to 3 days to evaluate for resolution of the pneumonia.  3. Tobacco Cessation: counseling and nicotine patch 4. STEMI; further recommendations as per cardiology.  5. Leukocytosis: possibly from the pneumonia. Continue to monitor.  6. DVT prophylaxis.   we will followup again tomorrow. Please contact me if I can be of assistance in the meanwhile. Thank you for this consultation.  Chief Complaint: substernal chest pain on 10/19  HPI:  57 year old gentleman, looks elderly than his age, with prior h/o COPD, on nasal oxygen at 4 li/min only at night at home, active smoker, last smoked three days ago, came in for substernal chest pain on 10/19, intermittent.  He was found to have STEMI and  was admitted by cardiology service and underwent cardiac cath showed thrombotic occlusion at mid RCA, underwent BMS stent. He was also found to  have right basilar infiltrate and was started on levaquin. He continued to require 4 lit [place] oxygen and medical service consulted for management of COPD. He denies any chest pain or sob. He was counseled on smoking cessation. He reports he had his PFT'S done one and half year ago by Dr Orson Aloe at [place]. He denies any nausea or vomiting, abdominal pain, hematochezia, hematemesis, or  Hemoptysis. No headache. No pedal edema.   Review of Systems:  Negative except for mentioned above.   Past Medical History  Diagnosis Date  . Tobacco abuse   . COPD (chronic obstructive pulmonary disease)    Past Surgical History  Procedure Laterality Date  . Dental extractions     Social History:  reports that he has been smoking.  He has never used smokeless tobacco. He reports that he does not drink alcohol or use illicit drugs.  Allergies  Allergen Reactions  . Codeine     Red blotches  . Penicillins     Childhood reaction   History reviewed. No pertinent family history.  Prior to Admission medications   Medication Sig Start Date End Date Taking? Authorizing Provider  acetaminophen (TYLENOL) 500 MG tablet Take by mouth every 6 (six) hours as needed for pain.   Yes Historical Provider, MD  albuterol (PROVENTIL HFA;VENTOLIN HFA) 108 (90 BASE) MCG/ACT inhaler Inhale 2 puffs into the lungs every 6 (six) hours as needed for wheezing.   Yes Historical Provider, MD  budesonide-formoterol (SYMBICORT) 160-4.5 MCG/ACT inhaler Inhale 2 puffs into the lungs 2 (two) times daily.   Yes Historical Provider, MD  Dextromethorphan-Guaifenesin (MUCINEX FAST-MAX DM MAX) 5-100 MG/5ML LIQD Take  15 mLs by mouth every 4 (four) hours as needed (for congestion).   Yes Historical Provider, MD  theophylline (THEODUR) 300 MG 12 hr tablet Take 300 mg by mouth 2 (two) times daily.   Yes Historical Provider, MD  tiotropium (SPIRIVA) 18 MCG inhalation capsule Place 18 mcg into inhaler and inhale daily.   Yes Historical Provider,  MD   Physical Exam: Blood pressure 100/70, pulse 99, temperature 97.9 F (36.6 C), temperature source Oral, resp. rate 27, height 5\' 10"  (1.778 m), weight 85.4 kg (188 lb 4.4 oz), SpO2 90.00%. Filed Vitals:   11/18/12 1100  BP: 100/70  Pulse: 99  Temp:   Resp: 27    Constitutional: Vital signs reviewed.  Patient is a well-developed and well-nourished in no acute distress and cooperative with exam. Alert and oriented x3.  Head: Normocephalic and atraumatic Mouth: no erythema or exudates, MMM Eyes: PERRL, EOMI, conjunctivae normal, No scleral icterus.  Neck: Supple, Trachea midline normal ROM, No JVD, mass, thyromegaly, or carotid bruit present.  Cardiovascular: RRR, S1 normal, S2 normal, no MRG,  Pulmonary/Chest: fair entry bilateral, scattered wheezing left posterior.  Abdominal: Soft. Non-tender, non-distended, bowel sounds are normal, no masses, organomegaly, or guarding present.  Musculoskeletal: No joint deformities, erythema, or stiffness, ROM full and no nontender Neurological: A&O x3, Strength is normal and symmetric bilaterally, , no focal motor deficit, sensory intact to light touch bilaterally.  Skin: Warm, dry and intact. No rash,  Psychiatric: Normal mood and affect.   Labs on Admission:  Basic Metabolic Panel:  Recent Labs Lab 11/17/12 1500 11/17/12 1530 11/17/12 1915 11/18/12 0230  NA 116* 131* 135 135  K 3.4* 4.2 3.7 4.1  CL 81* 93* 94* 96  CO2  --  27 27 27   GLUCOSE 73 97 127* 103*  BUN 13 14 14 13   CREATININE 0.50 0.69 0.72 0.73  CALCIUM  --  9.3 9.1 8.7   Liver Function Tests:  Recent Labs Lab 11/17/12 1915 11/18/12 0230  AST 102* 162*  ALT 26 32  ALKPHOS 113 101  BILITOT 0.4 0.4  PROT 7.1 6.4  ALBUMIN 2.5* 2.2*   No results found for this basename: LIPASE, AMYLASE,  in the last 168 hours No results found for this basename: AMMONIA,  in the last 168 hours CBC:  Recent Labs Lab 11/17/12 1500 11/17/12 1530 11/18/12 0230  WBC  --  10.8*  13.2*  NEUTROABS  --  9.2*  --   HGB 13.3 14.1 13.2  HCT 39.0 42.0 39.6  MCV  --  89.7 90.6  PLT  --  391 423*   Cardiac Enzymes:  Recent Labs Lab 11/17/12 1650 11/17/12 2235 11/18/12 0230  TROPONINI >20.00* >20.00* >20.00*   BNP: No components found with this basename: POCBNP,  CBG: No results found for this basename: GLUCAP,  in the last 168 hours  Radiological Exams on Admission: X-ray Chest Pa And Lateral  11/18/2012   CLINICAL DATA:  COPD, increasing shortness of breath  EXAM: CHEST  2 VIEW  COMPARISON:  11/17/2012  FINDINGS: The lungs are again hyperinflated. The cardiac shadow is stable. Persistent right basilar infiltrate is noted. No new focal abnormality is seen.  IMPRESSION: COPD with right basilar infiltrate.   Electronically Signed   By: Alcide Clever M.D.   On: 11/18/2012 07:53   Dg Chest Port 1 View  11/17/2012   CLINICAL DATA:  Cough and COPD.  EXAM: PORTABLE CHEST - 1 VIEW  COMPARISON:  09/18/2011  FINDINGS: Severe  COPD with hyperinflation of the lungs. Right apical emphysema.  Large area of infiltrate right lower lobe consistent with pneumonia. This was not present previously. Negative for heart failure or pleural effusion.  IMPRESSION: Severe COPD  Right lower lobe pneumonia.   Electronically Signed   By: Marlan Palau M.D.   On: 11/17/2012 17:27    EKG: NSR, with st t wave changes, inferior infarct.   Time spent: 55 min  Gurjit Loconte [facility] Pager [phone]  If 7PM-7AM, please contact night-coverage [email or web address] Password [facility] 11/18/2012, 11:52 AM

## 2012-11-18 NOTE — Progress Notes (Signed)
Subjective: Feeling better.  Breathing is labored  Objective: Vital signs in last 24 hours: Temp:  [98.6 F (37 C)-98.9 F (37.2 C)] 98.6 F (37 C) (10/21 0400) Pulse Rate:  [80-103] 103 (10/21 0800) Resp:  [17-31] 27 (10/21 0800) BP: (83-141)/(51-110) 90/56 mmHg (10/21 0700) SpO2:  [88 %-95 %] 91 % (10/21 0800) Weight:  [188 lb 4.4 oz (85.4 kg)] 188 lb 4.4 oz (85.4 kg) (10/20 1600) Last BM Date: 11/16/12  Intake/Output from previous day: 10/20 0701 - 10/21 0700 In: 1221.2 [P.O.:600; I.V.:621.2] Out: 1850 [Urine:1850] Intake/Output this shift: Total I/O In: 23.7 [I.V.:23.7] Out: 100 [Urine:100]  Medications Current Facility-Administered Medications  Medication Dose Route Frequency Provider Last Rate Last Dose  . 0.9 %  sodium chloride infusion  250 mL Intravenous PRN Joline Salt Barrett, PA-C 10 mL/hr at 11/17/12 2210 250 mL at 11/17/12 2210  . acetaminophen (TYLENOL) tablet 650 mg  650 mg Oral Q4H PRN Marykay Lex, MD   650 mg at 11/18/12 0359  . albuterol (PROVENTIL HFA;VENTOLIN HFA) 108 (90 BASE) MCG/ACT inhaler 2 puff  2 puff Inhalation Q6H PRN Marykay Lex, MD   2 puff at 11/18/12 0354  . ALPRAZolam Prudy Feeler) tablet 0.25 mg  0.25 mg Oral BID PRN Joline Salt Barrett, PA-C   0.25 mg at 11/17/12 2212  . aspirin EC tablet 81 mg  81 mg Oral Daily Rhonda G Barrett, PA-C      . atorvastatin (LIPITOR) tablet 80 mg  80 mg Oral q1800 Rhonda G Barrett, PA-C   80 mg at 11/17/12 1744  . budesonide-formoterol (SYMBICORT) 160-4.5 MCG/ACT inhaler 2 puff  2 puff Inhalation BID Marykay Lex, MD   2 puff at 11/17/12 1834  . enoxaparin (LOVENOX) injection 40 mg  40 mg Subcutaneous Q24H Rhonda G Barrett, PA-C   40 mg at 11/18/12 0755  . eptifibatide (INTEGRILIN) 75 mg / 100 mL infusion  2 mcg/kg/min Intravenous Continuous Marykay Lex, MD 13.7 mL/hr at 11/18/12 0112 2 mcg/kg/min at 11/18/12 0112  . guaifenesin (ROBITUSSIN) 100 MG/5ML syrup 400 mg  400 mg Oral TID Marykay Lex, MD       . influenza vac split quadrivalent PF (FLUARIX) injection 0.5 mL  0.5 mL Intramuscular Tomorrow-1000 Marykay Lex, MD      . isosorbide mononitrate (IMDUR) 24 hr tablet 30 mg  30 mg Oral Daily Marykay Lex, MD   30 mg at 11/17/12 2209  . levalbuterol (XOPENEX) nebulizer solution 0.63 mg  0.63 mg Nebulization Q8H Bryan Hager, PA-C      . levofloxacin (LEVAQUIN) IVPB 750 mg  750 mg Intravenous Daily Wilburt Finlay, PA-C      . metoprolol tartrate (LOPRESSOR) tablet 25 mg  25 mg Oral BID Marykay Lex, MD   25 mg at 11/17/12 2209  . morphine 2 MG/ML injection 2 mg  2 mg Intravenous Q1H PRN Marykay Lex, MD      . nicotine (NICODERM CQ - dosed in mg/24 hours) patch 14 mg  14 mg Transdermal Daily Marykay Lex, MD   14 mg at 11/17/12 1751  . nitroGLYCERIN (NITROSTAT) SL tablet 0.4 mg  0.4 mg Sublingual Q5 Min x 3 PRN Rhonda G Barrett, PA-C      . ondansetron (ZOFRAN) injection 4 mg  4 mg Intravenous Q6H PRN Rhonda G Barrett, PA-C      . sodium chloride 0.9 % injection 3 mL  3 mL Intravenous Q12H Darrol Jump, PA-C  3 mL at 11/17/12 2210  . sodium chloride 0.9 % injection 3 mL  3 mL Intravenous PRN Rhonda G Barrett, PA-C      . tiotropium (SPIRIVA) inhalation capsule 18 mcg  18 mcg Inhalation Daily Marykay Lex, MD      . zolpidem [facility]) tablet 5 mg  5 mg Oral QHS PRN Joline Salt Barrett, PA-C   5 mg at 11/18/12 0019    PE: General appearance: alert, cooperative and no distress Lungs: +rhonchi and wheezing Heart: Reg rhythm.  Rate a little fast.  No MM. Extremities: No LEE Pulses: 2+ and symmetric Skin: right groin: no hematoma.  Nontender. Neurologic: Grossly normal  Lab Results:   Recent Labs  11/17/12 1500 11/17/12 1530 11/18/12 0230  WBC  --  10.8* 13.2*  HGB 13.3 14.1 13.2  HCT 39.0 42.0 39.6  PLT  --  391 423*   BMET  Recent Labs  11/17/12 1530 11/17/12 1915 11/18/12 0230  NA 131* 135 135  K 4.2 3.7 4.1  CL 93* 94* 96  CO2 27 27 27   GLUCOSE 97 127*  103*  BUN 14 14 13   CREATININE 0.69 0.72 0.73  CALCIUM 9.3 9.1 8.7   PT/INR  Recent Labs  11/17/12 1530  LABPROT 17.3*  INR 1.45   Cholesterol  Recent Labs  11/18/12 0230  CHOL 128   Lipid Panel     Component Value Date/Time   CHOL 128 11/18/2012 0230   TRIG 138 11/18/2012 0230   HDL 26* 11/18/2012 0230   CHOLHDL 4.9 11/18/2012 0230   VLDL 28 11/18/2012 0230   LDLCALC 74 11/18/2012 0230   Cardiac Panel (last 3 results)  Recent Labs  11/17/12 1650 11/17/12 2235 11/18/12 0230  TROPONINI >20.00* >20.00* >20.00*   Assessment/Plan  Principal Problem:   ST elevation myocardial infarction (STEMI) of inferior wall, initial episode of care Active Problems:   Pneumonia   COPD (chronic obstructive pulmonary disease)  Plan:  STEMI.  SP BMS to mid RCA.  Hypotensive.  Will need to DC ACE for the time being.  Continue lopressor.  CXR: COPD with right basilar infiltrate.  Ambulating short distances well.  Xopenex nebs now and Q8hr.      LOS: 1 day   HAGER, BRYAN 11/18/2012 7:48 AM  I have seen and evaluated the patient this AM along with Wilburt Finlay, PA. I agree with his findings, examination as well as impression recommendations.  As expected, troponin levels are quite elevated after prolonged Angina.  ST segments still somewhat elevated.  No further CP => completing 18 hr Integrelin. --> on DAPT.    With borderline Hypotension o/n - d/c ACE-I & hold off on further diuresis  Continue with B1 selective BB for HR  Lungs sounds a re quite concerning for & CXR also "confirms" presence of RLL PNA.  -- breathing Rx ordered; add IS; start Levaquin & c/s TRH (+/- PCCM)  Continue statin. & PPI   Marykay Lex, M.D., M.S. [facility] [address] [place], [place]  [address]  [phone] Pager # [phone] 11/18/2012 8:31 AM

## 2012-11-18 NOTE — Progress Notes (Signed)
Rt groin at site of cath which was peri-close, oozing and dressing is saturated with serosanguinous fluid. Dressing removed and manual pressure applied x 10 mins. No oozing noted, pressure dressing placed. Will continue to monitor closely.

## 2012-11-19 ENCOUNTER — Inpatient Hospital Stay (HOSPITAL_COMMUNITY): Payer: BC Managed Care – PPO | Admitting: Certified Registered"

## 2012-11-19 ENCOUNTER — Encounter (HOSPITAL_COMMUNITY): Payer: BC Managed Care – PPO | Admitting: Certified Registered"

## 2012-11-19 ENCOUNTER — Encounter (HOSPITAL_COMMUNITY): Payer: Self-pay | Admitting: Certified Registered"

## 2012-11-19 DIAGNOSIS — E785 Hyperlipidemia, unspecified: Secondary | ICD-10-CM

## 2012-11-19 DIAGNOSIS — Z87891 Personal history of nicotine dependence: Secondary | ICD-10-CM | POA: Diagnosis present

## 2012-11-19 DIAGNOSIS — I517 Cardiomegaly: Secondary | ICD-10-CM

## 2012-11-19 DIAGNOSIS — I4891 Unspecified atrial fibrillation: Secondary | ICD-10-CM

## 2012-11-19 DIAGNOSIS — I480 Paroxysmal atrial fibrillation: Secondary | ICD-10-CM | POA: Diagnosis not present

## 2012-11-19 DIAGNOSIS — I255 Ischemic cardiomyopathy: Secondary | ICD-10-CM

## 2012-11-19 DIAGNOSIS — R570 Cardiogenic shock: Secondary | ICD-10-CM

## 2012-11-19 DIAGNOSIS — E1169 Type 2 diabetes mellitus with other specified complication: Secondary | ICD-10-CM | POA: Diagnosis present

## 2012-11-19 HISTORY — DX: Ischemic cardiomyopathy: I25.5

## 2012-11-19 HISTORY — DX: Hyperlipidemia, unspecified: E78.5

## 2012-11-19 HISTORY — DX: Unspecified atrial fibrillation: I48.91

## 2012-11-19 MED ORDER — SODIUM CHLORIDE 0.9 % IV SOLN
INTRAVENOUS | Status: DC
  Administered 2012-11-19 – 2012-11-21 (×3): via INTRAVENOUS

## 2012-11-19 MED ORDER — ETOMIDATE 2 MG/ML IV SOLN
INTRAVENOUS | Status: DC | PRN
  Administered 2012-11-19: 14 mg via INTRAVENOUS

## 2012-11-19 MED ORDER — ATORVASTATIN CALCIUM 10 MG PO TABS
10.0000 mg | ORAL_TABLET | Freq: Every day | ORAL | Status: DC
  Administered 2012-11-19 – 2012-11-22 (×4): 10 mg via ORAL
  Filled 2012-11-19 (×4): qty 1

## 2012-11-19 MED ORDER — IPRATROPIUM BROMIDE 0.02 % IN SOLN
RESPIRATORY_TRACT | Status: AC
  Filled 2012-11-19: qty 2.5

## 2012-11-19 MED ORDER — AMIODARONE HCL 200 MG PO TABS
400.0000 mg | ORAL_TABLET | Freq: Two times a day (BID) | ORAL | Status: DC
  Administered 2012-11-19 – 2012-11-22 (×6): 400 mg via ORAL
  Filled 2012-11-19 (×8): qty 2

## 2012-11-19 MED ORDER — METOPROLOL TARTRATE 12.5 MG HALF TABLET: 12.5000 mg | ORAL_TABLET | Freq: Three times a day (TID) | ORAL | Status: DC

## 2012-11-19 MED ORDER — AMIODARONE HCL 200 MG PO TABS: 400.0000 mg | ORAL_TABLET | Freq: Three times a day (TID) | ORAL | Status: DC

## 2012-11-19 MED ORDER — BIOTENE DRY MOUTH MT LIQD
15.0000 mL | Freq: Two times a day (BID) | OROMUCOSAL | Status: DC
  Administered 2012-11-19 – 2012-11-22 (×7): 15 mL via OROMUCOSAL

## 2012-11-19 MED ORDER — LEVALBUTEROL HCL 0.63 MG/3ML IN NEBU
0.6300 mg | INHALATION_SOLUTION | Freq: Four times a day (QID) | RESPIRATORY_TRACT | Status: DC
  Administered 2012-11-19 – 2012-11-22 (×12): 0.63 mg via RESPIRATORY_TRACT
  Filled 2012-11-19 (×22): qty 3

## 2012-11-19 MED ORDER — HEPARIN BOLUS VIA INFUSION
2500.0000 [IU] | Freq: Once | INTRAVENOUS | Status: AC
  Administered 2012-11-19: 2500 [IU] via INTRAVENOUS
  Filled 2012-11-19: qty 2500

## 2012-11-19 MED ORDER — HEPARIN (PORCINE) IN NACL 100-0.45 UNIT/ML-% IJ SOLN
1850.0000 [IU]/h | INTRAMUSCULAR | Status: DC
  Administered 2012-11-19: 1200 [IU]/h via INTRAVENOUS
  Administered 2012-11-20: 1850 [IU]/h via INTRAVENOUS
  Administered 2012-11-20: 1500 [IU]/h via INTRAVENOUS
  Filled 2012-11-19 (×4): qty 250

## 2012-11-19 MED ORDER — SODIUM CHLORIDE 0.9 % IV SOLN
Freq: Once | INTRAVENOUS | Status: AC
  Administered 2012-11-19: 10:00:00 via INTRAVENOUS

## 2012-11-19 MED ORDER — LEVALBUTEROL HCL 0.63 MG/3ML IN NEBU
INHALATION_SOLUTION | RESPIRATORY_TRACT | Status: AC
  Filled 2012-11-19: qty 3

## 2012-11-19 MED ORDER — IPRATROPIUM BROMIDE 0.02 % IN SOLN
0.5000 mg | Freq: Four times a day (QID) | RESPIRATORY_TRACT | Status: DC
  Administered 2012-11-19 – 2012-11-22 (×12): 0.5 mg via RESPIRATORY_TRACT
  Filled 2012-11-19 (×11): qty 2.5

## 2012-11-19 MED FILL — Heparin Sodium (Porcine) 2 Unit/ML in Sodium Chloride 0.9%: INTRAMUSCULAR | Qty: 2000 | Status: AC

## 2012-11-19 MED FILL — Nitroglycerin IV Soln 200 MCG/ML in D5W: INTRAVENOUS | Qty: 1 | Status: AC

## 2012-11-19 MED FILL — Bivalirudin Trifluoroacetate For IV Soln 250 MG (Base Equiv): INTRAVENOUS | Qty: 250 | Status: AC

## 2012-11-19 MED FILL — Lidocaine HCl Local Preservative Free (PF) Inj 1%: INTRAMUSCULAR | Qty: 30 | Status: AC

## 2012-11-19 MED FILL — Sodium Chloride IV Soln 0.9%: INTRAVENOUS | Qty: 50 | Status: AC

## 2012-11-19 MED FILL — Midazolam HCl Inj 10 MG/2ML (Base Equivalent): INTRAMUSCULAR | Qty: 2 | Status: AC

## 2012-11-19 MED FILL — Fentanyl Citrate Inj 0.05 MG/ML: INTRAMUSCULAR | Qty: 2 | Status: AC

## 2012-11-19 NOTE — Preoperative (Signed)
Beta Blockers   Reason not to administer Beta Blockers:Not Applicable 

## 2012-11-19 NOTE — Progress Notes (Signed)
I have reviewed his echocardiogram. EF is 40-45%, inferior hypokinesis to akinesis, dyskinetic septum. BP is mildly improved. The RV apex is hypokinetic. He may benefit from low dose dobutamine if he has further hypotension.  I think we need to try some type of antiarrythmic medication to try to afford him a chance to get out of atrial flutter. Will start po amiodarone load - will need to watch carefully given his COPD, however, there are really no other good alternatives in the setting of recent STEMI.   Chrystie Nose, MD, [facility] Attending Cardiologist [facility]

## 2012-11-19 NOTE — Anesthesia Preprocedure Evaluation (Addendum)
Anesthesia Evaluation  Patient identified by MRN, date of birth, ID band Patient awake    Reviewed: Allergy & Precautions, H&P , NPO status , Patient's Chart, lab work & pertinent test results, reviewed documented beta blocker date and time   History of Anesthesia Complications Negative for: history of anesthetic complications  Airway Mallampati: II TM Distance: >3 FB     Dental  (+) Dental Advisory Given   Pulmonary pneumonia -, COPD COPD inhaler, Current Smoker,    + decreased breath sounds      Cardiovascular + Past MI (recent inferior infact, EF 50-55%) + dysrhythmias Atrial Fibrillation Rhythm:Irregular Rate:Normal  Hypotensive with Aflutter   Neuro/Psych negative neurological ROS     GI/Hepatic negative GI ROS, Neg liver ROS,   Endo/Other  negative endocrine ROS  Renal/GU negative Renal ROS     Musculoskeletal   Abdominal   Peds  Hematology negative hematology ROS (+)   Anesthesia Other Findings   Reproductive/Obstetrics                          Anesthesia Physical Anesthesia Plan  ASA: III and emergent  Anesthesia Plan: General   Post-op Pain Management:    Induction:   Airway Management Planned: Mask and Natural Airway  Additional Equipment:   Intra-op Plan:   Post-operative Plan:   Informed Consent: I have reviewed the patients History and Physical, chart, labs and discussed the procedure including the risks, benefits and alternatives for the proposed anesthesia with the patient or authorized representative who has indicated his/her understanding and acceptance.   Dental advisory given  Plan Discussed with: CRNA and Surgeon  Anesthesia Plan Comments: (Plan routine monitors, GA for cardioversion)        Anesthesia Quick Evaluation

## 2012-11-19 NOTE — Progress Notes (Signed)
  Echocardiogram 2D Echocardiogram has been performed.  Colum Colt FRANCES 11/19/2012, 5:36 PM

## 2012-11-19 NOTE — Progress Notes (Signed)
Subjective:  No chest pain or SOB at rest  Objective:  Vital Signs in the last 24 hours: Temp:  [98 F (36.7 C)-99.2 F (37.3 C)] 98.1 F (36.7 C) (10/22 0800) Pulse Rate:  [50-122] 81 (10/22 0900) Resp:  [17-34] 26 (10/22 0900) BP: (77-113)/(48-83) 86/57 mmHg (10/22 0900) SpO2:  [85 %-96 %] 86 % (10/22 0900) Weight:  [187 lb 6.3 oz (85 kg)] 187 lb 6.3 oz (85 kg) (10/22 0500)  Intake/Output from previous day:  Intake/Output Summary (Last 24 hours) at 11/19/12 0951 Last data filed at 11/19/12 0932  Gross per 24 hour  Intake 1556.55 ml  Output    375 ml  Net 1181.55 ml    Physical Exam: General appearance: alert, cooperative, appears older than stated age and no distress Neck: no bruit Lungs: decreased breath sounds on Lt, rhonchi on Rt Heart: irregularly irregular rhythm Extremities: Rt groin without hematoma   Rate: 110  Rhythm: atrial fibrillation  Lab Results:  Recent Labs  11/17/12 1530 11/18/12 0230  WBC 10.8* 13.2*  HGB 14.1 13.2  PLT 391 423*    Recent Labs  11/17/12 1915 11/18/12 0230  NA 135 135  K 3.7 4.1  CL 94* 96  CO2 27 27  GLUCOSE 127* 103*  BUN 14 13  CREATININE 0.72 0.73    Recent Labs  11/17/12 2235 11/18/12 0230  TROPONINI >20.00* >20.00*    Recent Labs  11/17/12 1530  INR 1.45    Imaging: Imaging results have been reviewed  Cardiac Studies:  Assessment/Plan:   Principal Problem:   STEMI of inferior wall 11/18/12- RCA BMS Active Problems:   Pneumonia- Rt   Hypotension- post inferior MI   Atrial fibrillation with RVR   COPD (chronic obstructive pulmonary disease)   Smoker   Dyslipidemia- low HDL    PLAN:  He is hypotensive S/P large inferior MI- will agressively hydrate. He just went into AF with VR around 110, continue beta blocker at lower dose. Decrease Lipitor- LDL 74, HDL 26. If we can't get his pressure up he may need DCCV. Consider Heparin if he doesn't convert soon.  Corine Shelter PA-C Beeper  [phone] 11/19/2012, 9:51 AM

## 2012-11-19 NOTE — CV Procedure (Signed)
    CARDIOVERSION NOTE  Procedure: Electrical Cardioversion Indications:  Atrial Flutter  Procedure Details:  Consent: Risks of procedure as well as the alternatives and risks of each were explained to the (patient/caregiver).  Consent for procedure obtained.  Time Out: Verified patient identification, verified procedure, site/side was marked, verified correct patient position, special equipment/implants available, medications/allergies/relevent history reviewed, required imaging and test results available.  Performed  Patient placed on cardiac monitor, pulse oximetry, supplemental oxygen as necessary.  Sedation given: per anesthesia Pacer pads placed anterior and posterior chest.  Cardioverted 2 time(s).  Cardioverted at 150J and 200J biphasic.  Impression: Findings: Post procedure EKG shows: Atrial Flutter Complications: None Patient did tolerate procedure well.  There was brief conversion to NSR for a few minutes and then he went back into atrial flutter.  Plan: 1. Discussed briefly with Dr. Graciela Husbands (EP) - he is concerned hypotension may be due to RV infarct. Will obtain echocardiogram and consider low dose dobutamine for hypotension. May consider po amiodarone loading, although he does have significant underlying lung disease. This would not be a good long-term option. Given his recent STEMI, there are few safe antiarrythmic options - ibutilide was suggested.  Time Spent Directly with the Patient:  30 minutes   Chrystie Nose, MD, [facility] Attending Cardiologist [facility]  Hervey Wedig C 11/19/2012, 5:02 PM

## 2012-11-19 NOTE — Progress Notes (Signed)
Pt. Seen and examined. Agree with the NP/PA-C note as written. No complaints of chest pain, however, went into a-fib with RVR this am - now appears to be in atrial flutter with variable response. He is hypotensive. Stop b-blocker and imdur, ?RV infarct versus diastolic dysfunction in the setting of inferior MI and loss of atrial kick. Rate control is not bad - consider digoxin as needed for rate control. IV fluid bolus as ordered. May need vasopressor such as neosynephrine or cardioversion if he does not respond to the above therapies.  Chrystie Nose, MD, [facility] Attending Cardiologist [facility]

## 2012-11-19 NOTE — Progress Notes (Signed)
ANTICOAGULATION CONSULT NOTE - Follow Up Consult  Pharmacy Consult for Heparin Indication: atrial fibrillation  Allergies  Allergen Reactions  . Codeine     Red blotches  . Penicillins     Childhood reaction    Patient Measurements: Height: 5\' 10"  (177.8 cm) Weight: 187 lb 6.3 oz (85 kg) IBW/kg (Calculated) : 73  Vital Signs: Temp: 98 F (36.7 C) (10/22 1700) Temp src: Axillary (10/22 1700) BP: 96/57 mmHg (10/22 2200) Pulse Rate: 99 (10/22 2200)  Labs:  Recent Labs  11/17/12 1500 11/17/12 1530 11/17/12 1650 11/17/12 1915 11/17/12 2235 11/18/12 0230 11/19/12 0845  HGB 13.3 14.1  --   --   --  13.2  --   HCT 39.0 42.0  --   --   --  39.6  --   PLT  --  391  --   --   --  423*  --   APTT  --  76*  --   --   --   --   --   LABPROT  --  17.3*  --   --   --   --   --   INR  --  1.45  --   --   --   --   --   HEPARINUNFRC  --   --   --   --   --   --  <0.10*  CREATININE 0.50 0.69  --  0.72  --  0.73  --   TROPONINI  --   --  >20.00*  --  >20.00* >20.00*  --     Estimated Creatinine Clearance: 106.5 ml/min (by C-G formula based on Cr of 0.73).   Medications:  Infusions:  . sodium chloride 100 mL/hr at 11/19/12 1408  . heparin 1,200 Units/hr (11/19/12 1408)    Assessment: 57 year old male started on Heparin for atrial fibrillation.  His initial heparin level is undetectable.  Goal of Therapy:  Heparin level 0.3-0.7 units/ml Monitor platelets by anticoagulation protocol: Yes   Plan:  Give Heparin 2500 units IV bolus Increase Heparin to 1500 units/hr Check Heparin level and CBC in 6 hours  Estella Husk, Pharm.D., BCPS, AAHIVP Clinical Pharmacist Phone: [phone] or [phone] 11/19/2012, 10:16 PM

## 2012-11-19 NOTE — Progress Notes (Signed)
ANTICOAGULATION CONSULT NOTE - Initial Consult  Pharmacy Consult for heparin Indication: atrial fibrillation  Allergies  Allergen Reactions  . Codeine     Red blotches  . Penicillins     Childhood reaction    Patient Measurements: Height: 5\' 10"  (177.8 cm) Weight: 187 lb 6.3 oz (85 kg) IBW/kg (Calculated) : 73 Heparin Dosing Weight: 85kg  Vital Signs: Temp: 98.1 F (36.7 C) (10/22 0800) Temp src: Oral (10/22 0800) BP: 86/57 mmHg (10/22 0900) Pulse Rate: 81 (10/22 0900)  Labs:  Recent Labs  11/17/12 1500 11/17/12 1530 11/17/12 1650 11/17/12 1915 11/17/12 2235 11/18/12 0230  HGB 13.3 14.1  --   --   --  13.2  HCT 39.0 42.0  --   --   --  39.6  PLT  --  391  --   --   --  423*  APTT  --  76*  --   --   --   --   LABPROT  --  17.3*  --   --   --   --   INR  --  1.45  --   --   --   --   CREATININE 0.50 0.69  --  0.72  --  0.73  TROPONINI  --   --  >20.00*  --  >20.00* >20.00*    Estimated Creatinine Clearance: 106.5 ml/min (by C-G formula based on Cr of 0.73).   Medical History: Past Medical History  Diagnosis Date  . Tobacco abuse   . COPD (chronic obstructive pulmonary disease)    Assessment: 57 year old male s/p stemi/BMS to RCA now with new onset afib with HR in 110s. Patient was started on sq lovenox for px post cath and will now transition to IV heparin. Lovenox given this morning, given that patient has good renal function and wt of 85kg, 40mg  dose given. Will start IV heparin this after noon about 6 hours post lovenox dose. CBC appears stable, no bleeding issues noted. He is on DAPT.  Goal of Therapy:  Heparin level 0.3-0.7 units/ml Monitor platelets by anticoagulation protocol: Yes   Plan:  Start heparin infusion at 1200 units/hr Check anti-Xa level in 6 hours and daily while on heparin Continue to monitor H&H and platelets  Sheppard Coil PharmD., BCPS Clinical Pharmacist Pager [phone] 11/19/2012 10:15 AM

## 2012-11-19 NOTE — Transfer of Care (Signed)
Immediate Anesthesia Transfer of Care Note  Patient: Benjamin [name]  Procedure(s) Performed: * No procedures listed *  Patient Location: Nursing Unit  Anesthesia Type:General  Level of Consciousness: awake, alert , oriented and patient cooperative  Airway & Oxygen Therapy: Patient Spontanous Breathing and Patient connected to nasal cannula oxygen  Post-op Assessment: Report given to PACU RN, Post -op Vital signs reviewed and stable and Patient moving all extremities  Post vital signs: Reviewed and stable  Complications: No apparent anesthesia complications

## 2012-11-19 NOTE — Progress Notes (Signed)
[facility] CONSULT F/U Note [place] TEAM 1 - Stepdown/ICU TEAM   Benjamin [name] YNW:[id number] DOB: 12-22-1955 DOA: 11/17/2012 PCP: Cassell Smiles., MD  Admit HPI / Brief Narrative: 57 year old gentleman with prior h/o COPD, on nasal oxygen at 4 li/min only at night at home, active smoker, came in for substernal chest pain on 10/19, intermittent. He was found to have STEMI and was admitted by Cardiology service and underwent cardiac cath showed thrombotic occlusion at mid RCA, underwent BMS stent. He was also found to have right basilar infiltrate and was started on levaquin. He continued to require 4 lit [place] oxygen and [facility] was consulted for management of COPD and tx of his PNA.  Assessment/Plan:  RLL CAP Review of old xrays reveal similar appearing abnormality dating back to May 2013 - suspect pt has severe bullous emphysema - CT of chest and spirometry will be useful once other issues stable (even as oupt)  COPD w/ ongoing tobacco abuse  See comments above - given relatively young age, ?if alpha 1 antitrypsin deficiency is contributing - will check labs - cont to tx expiratory wheeze w/ nebs and steroids - minimize nebs as much as possible in setting of Afib/flutter  STEMI Per Cardiology/primary service  New onset afib/flutter w/ RVR Per Cardiology/primary service  Code Status: FULL Family Communication: no family present at time of exam  Antibiotics: Levaquin 10/21 >>  DVT prophylaxis: IV heparin  HPI/Subjective: The patient is resting comfortable at the time of my visit.  He reports some mild intermittent shortness of breath.  He denies chest pain fever chills nausea vomiting or abdominal pain.  Objective: Blood pressure 86/57, pulse 83, temperature 98 F (36.7 C), temperature source Oral, resp. rate 21, height 5\' 10"  (1.778 m), weight 85 kg (187 lb 6.3 oz), SpO2 93.00%.  Intake/Output Summary (Last 24 hours) at 11/19/12 1321 Last data filed at 11/19/12  1300  Gross per 24 hour  Intake   1428 ml  Output    550 ml  Net    878 ml   Exam: General: No acute respiratory distress when laying in bed Lungs: Distant air movement throughout all fields with mild expiratory wheeze, scattered right lung crackles, but no prolonged expiratory phase Cardiovascular: Irregular - no appreciable gallop or rub Abdomen: Nontender, nondistended, soft, bowel sounds positive, no rebound, no ascites, no appreciable mass Extremities: No significant cyanosis, clubbing, or edema bilateral lower extremities  Data Reviewed: Basic Metabolic Panel:  Recent Labs Lab 11/17/12 1500 11/17/12 1530 11/17/12 1915 11/18/12 0230  NA 116* 131* 135 135  K 3.4* 4.2 3.7 4.1  CL 81* 93* 94* 96  CO2  --  27 27 27   GLUCOSE 73 97 127* 103*  BUN 13 14 14 13   CREATININE 0.50 0.69 0.72 0.73  CALCIUM  --  9.3 9.1 8.7   Liver Function Tests:  Recent Labs Lab 11/17/12 1915 11/18/12 0230  AST 102* 162*  ALT 26 32  ALKPHOS 113 101  BILITOT 0.4 0.4  PROT 7.1 6.4  ALBUMIN 2.5* 2.2*   CBC:  Recent Labs Lab 11/17/12 1500 11/17/12 1530 11/18/12 0230  WBC  --  10.8* 13.2*  NEUTROABS  --  9.2*  --   HGB 13.3 14.1 13.2  HCT 39.0 42.0 39.6  MCV  --  89.7 90.6  PLT  --  391 423*   Cardiac Enzymes:  Recent Labs Lab 11/17/12 1650 11/17/12 2235 11/18/12 0230  TROPONINI >20.00* >20.00* >20.00*   BNP (last  3 results)  Recent Labs  11/17/12 1650  PROBNP 718.1*    Recent Results (from the past 240 hour(s))  MRSA PCR SCREENING     Status: None   Collection Time    11/17/12  4:23 PM      Result Value Range Status   MRSA by PCR NEGATIVE  NEGATIVE Final   Comment:            The GeneXpert MRSA Assay (FDA     approved for NASAL specimens     only), is one component of a     comprehensive MRSA colonization     surveillance program. It is not     intended to diagnose MRSA     infection nor to guide or     monitor treatment for     MRSA infections.      Studies:  Recent x-ray studies have been reviewed in detail by the Attending Physician  Scheduled Meds:  Scheduled Meds: . antiseptic oral rinse  15 mL Mouth Rinse BID  . aspirin EC  81 mg Oral Daily  . atorvastatin  10 mg Oral q1800  . budesonide-formoterol  2 puff Inhalation BID  . guaifenesin  400 mg Oral TID  . nicotine  14 mg Transdermal Daily  . predniSONE  60 mg Oral QAC breakfast  . sodium chloride  3 mL Intravenous Q12H  . Ticagrelor  90 mg Oral BID    Time spent on care of this patient: 35 mins   [facility] T  [facility] Office  [phone] Pager - Text Page per Loretha Stapler as per below:  On-Call/Text Page:      [email or web address]      password [email or web address]  If 7PM-7AM, please contact night-coverage [email or web address] Password [email or web address] 11/19/2012, 1:21 PM   LOS: 2 days

## 2012-11-19 NOTE — Anesthesia Postprocedure Evaluation (Signed)
  Anesthesia Post-op Note  Patient: Benjamin [name]  Procedure(s) Performed: * No procedures listed *  Patient Location: Nursing Unit  Anesthesia Type:General  Level of Consciousness: awake, alert , oriented and patient cooperative  Airway and Oxygen Therapy: Patient Spontanous Breathing and Patient connected to nasal cannula oxygen  Post-op Pain: none  Post-op Assessment: Post-op Vital signs reviewed, Patient's Cardiovascular Status Stable, Respiratory Function Stable, Patent Airway, No signs of Nausea or vomiting and Pain level controlled  Post-op Vital Signs: Reviewed and stable  Complications: No apparent anesthesia complications

## 2012-11-20 LAB — CBC
HCT: 39.3 % (ref 39.0–52.0)
Hemoglobin: 12.8 g/dL — ABNORMAL LOW (ref 13.0–17.0)
MCHC: 32.6 g/dL (ref 30.0–36.0)
MCV: 91.6 fL (ref 78.0–100.0)
Platelets: 481 10*3/uL — ABNORMAL HIGH (ref 150–400)
RDW: 13.9 % (ref 11.5–15.5)

## 2012-11-20 LAB — BASIC METABOLIC PANEL
BUN: 15 mg/dL (ref 6–23)
CO2: 28 mEq/L (ref 19–32)
Calcium: 8.5 mg/dL (ref 8.4–10.5)
Chloride: 98 mEq/L (ref 96–112)
Creatinine, Ser: 0.69 mg/dL (ref 0.50–1.35)
GFR calc Af Amer: >90 mL/min (ref >90)
GFR calc non Af Amer: >90 mL/min (ref >90)
Glucose, Bld: 97 mg/dL (ref 70–99)
Potassium: 4.3 mEq/L (ref 3.5–5.1)
Sodium: 135 mEq/L (ref 135–145)

## 2012-11-20 LAB — MAGNESIUM: Magnesium: 2.4 mg/dL (ref 1.5–2.5)

## 2012-11-20 LAB — PROTIME-INR: INR: 1.03 (ref 0.00–1.49)

## 2012-11-20 LAB — HEPARIN LEVEL (UNFRACTIONATED): Heparin Unfractionated: 0.25 IU/mL — ABNORMAL LOW (ref 0.30–0.70)

## 2012-11-20 LAB — TSH: TSH: 0.43 u[IU]/mL (ref 0.350–4.500)

## 2012-11-20 MED ORDER — WARFARIN SODIUM 7.5 MG PO TABS
7.5000 mg | ORAL_TABLET | Freq: Once | ORAL | Status: AC
  Administered 2012-11-20: 7.5 mg via ORAL
  Filled 2012-11-20: qty 1

## 2012-11-20 MED ORDER — LEVOFLOXACIN 750 MG PO TABS
750.0000 mg | ORAL_TABLET | ORAL | Status: DC
  Administered 2012-11-20 – 2012-11-22 (×3): 750 mg via ORAL
  Filled 2012-11-20 (×3): qty 1

## 2012-11-20 MED ORDER — HEPARIN (PORCINE) IN NACL 100-0.45 UNIT/ML-% IJ SOLN
2300.0000 [IU]/h | INTRAMUSCULAR | Status: DC
  Administered 2012-11-20: 2150 [IU]/h via INTRAVENOUS
  Administered 2012-11-21: 2300 [IU]/h via INTRAVENOUS
  Filled 2012-11-20 (×4): qty 250

## 2012-11-20 MED ORDER — WARFARIN - PHARMACIST DOSING INPATIENT: Freq: Every day | Status: DC

## 2012-11-20 MED ORDER — BUDESONIDE-FORMOTEROL FUMARATE 160-4.5 MCG/ACT IN AERO
2.0000 | INHALATION_SPRAY | Freq: Two times a day (BID) | RESPIRATORY_TRACT | Status: DC
  Administered 2012-11-20 – 2012-11-22 (×4): 2 via RESPIRATORY_TRACT
  Filled 2012-11-20: qty 6

## 2012-11-20 MED ORDER — HEPARIN BOLUS VIA INFUSION
3000.0000 [IU] | Freq: Once | INTRAVENOUS | Status: AC
  Administered 2012-11-20: 3000 [IU] via INTRAVENOUS
  Filled 2012-11-20: qty 3000

## 2012-11-20 NOTE — Progress Notes (Signed)
[facility] CONSULT F/U Note [name] TEAM 1 - Stepdown/ICU TEAM   Benjamin [name]:[id number] DOB: [birth year] DOA: 11/17/2012 PCP: Cassell Smiles., MD  Admit HPI / Brief Narrative: 57 year old gentleman with prior h/o COPD, on nasal oxygen at 4 li/min only at night at home, active smoker, came in for substernal chest pain on 10/19, intermittent. He was found to have STEMI and was admitted by Cardiology service and underwent cardiac cath showed thrombotic occlusion at mid RCA, underwent BMS stent. He was also found to have right basilar infiltrate and was started on levaquin. He continued to require 4 lit [place] oxygen and [facility] was consulted for management of COPD and tx of his PNA.  Assessment/Plan:  RLL CAP Review of old xrays reveal similar appearing abnormality dating back to May 2013 - suspect pt has severe bullous emphysema - CT of chest and spirometry will be useful once other issues stable (can be done as oupt)  COPD w/ ongoing tobacco abuse  See comments above - given relatively young age, ?if alpha 1 antitrypsin deficiency is contributing - results pending - question underlying asthma - cont to tx expiratory wheeze w/ nebs and steroids - minimize nebs as much as possible in setting of Afib/flutter  STEMI Per Cardiology/primary service  New onset afib/flutter w/ RVR Per Cardiology/primary service  Code Status: FULL Family Communication: with daughter at bedside  Antibiotics: Levaquin 10/21 >>  DVT prophylaxis: IV heparin  HPI/Subjective: The patient is noted to have wheezing on exam. Both pt and daughter at bedside state that he is doing better today than usually he does at home. Coughing up clear sputum which has loosened up significantly compared to a few days ago.   Objective: Blood pressure 97/70, pulse 85, temperature 98.5 F (36.9 C), temperature source Oral, resp. rate 27, height 5\' 10"  (1.778 m), weight 85 kg (187 lb 6.3 oz), SpO2  95.00%.  Intake/Output Summary (Last 24 hours) at 11/20/12 1735 Last data filed at 11/20/12 1400  Gross per 24 hour  Intake 2021.1 ml  Output   1675 ml  Net  346.1 ml   Exam: General: No acute respiratory distress when laying in bed Lungs: Distant air movement throughout all fields with mild expiratory wheeze, scattered right lung crackles, but no prolonged expiratory phase Cardiovascular: Irregular - no appreciable gallop or rub Abdomen: Nontender, nondistended, soft, bowel sounds positive, no rebound, no ascites, no appreciable mass Extremities: No significant cyanosis, clubbing, or edema bilateral lower extremities  Data Reviewed: Basic Metabolic Panel:  Recent Labs Lab 11/17/12 1500 11/17/12 1530 11/17/12 1915 11/18/12 0230 11/20/12 0554  NA 116* 131* 135 135 135  K 3.4* 4.2 3.7 4.1 4.3  CL 81* 93* 94* 96 98  CO2  --  27 27 27 28   GLUCOSE 73 97 127* 103* 97  BUN 13 14 14 13 15   CREATININE 0.50 0.69 0.72 0.73 0.69  CALCIUM  --  9.3 9.1 8.7 8.5  MG  --   --   --   --  2.4   Liver Function Tests:  Recent Labs Lab 11/17/12 1915 11/18/12 0230  AST 102* 162*  ALT 26 32  ALKPHOS 113 101  BILITOT 0.4 0.4  PROT 7.1 6.4  ALBUMIN 2.5* 2.2*   CBC:  Recent Labs Lab 11/17/12 1500 11/17/12 1530 11/18/12 0230 11/20/12 0554  WBC  --  10.8* 13.2* 15.4*  NEUTROABS  --  9.2*  --   --   HGB 13.3 14.1 13.2 12.8*  HCT 39.0 42.0 39.6 39.3  MCV  --  89.7 90.6 91.6  PLT  --  391 423* 481*   Cardiac Enzymes:  Recent Labs Lab 11/17/12 1650 11/17/12 2235 11/18/12 0230  TROPONINI >20.00* >20.00* >20.00*   BNP (last 3 results)  Recent Labs  11/17/12 1650  PROBNP 718.1*    Recent Results (from the past 240 hour(s))  MRSA PCR SCREENING     Status: None   Collection Time    11/17/12  4:23 PM      Result Value Range Status   MRSA by PCR NEGATIVE  NEGATIVE Final   Comment:            The GeneXpert MRSA Assay (FDA     approved for NASAL specimens     only), is  one component of a     comprehensive MRSA colonization     surveillance program. It is not     intended to diagnose MRSA     infection nor to guide or     monitor treatment for     MRSA infections.     Studies:  Recent x-ray studies have been reviewed in detail by the Attending Physician  Scheduled Meds:  Scheduled Meds: . amiodarone  400 mg Oral BID  . antiseptic oral rinse  15 mL Mouth Rinse BID  . aspirin EC  81 mg Oral Daily  . atorvastatin  10 mg Oral q1800  . guaifenesin  400 mg Oral TID  . ipratropium  0.5 mg Nebulization QID  . levalbuterol  0.63 mg Nebulization QID  . nicotine  14 mg Transdermal Daily  . predniSONE  60 mg Oral QAC breakfast  . sodium chloride  3 mL Intravenous Q12H  . Ticagrelor  90 mg Oral BID  . [COMPLETED] warfarin  7.5 mg Oral ONCE-1800  . Warfarin - Pharmacist Dosing Inpatient   Does not apply q1800    Time spent on care of this patient: 25 mins   Calvert Cantor, MD  [facility] Office  [phone] Pager - Text Page per Amion as per below:  On-Call/Text Page:      [email or web address]      password [email or web address]  If 7PM-7AM, please contact night-coverage [email or web address] Password [email or web address] 11/20/2012, 5:35 PM   LOS: 3 days

## 2012-11-20 NOTE — Progress Notes (Signed)
ANTICOAGULATION CONSULT NOTE Pharmacy Consult for Heparin Indication: atrial fibrillation  Allergies  Allergen Reactions  . Codeine     Red blotches  . Penicillins     Childhood reaction    Patient Measurements: Height: 5\' 10"  (177.8 cm) Weight: 187 lb 6.3 oz (85 kg) IBW/kg (Calculated) : 73  Vital Signs: Temp: 98.2 F (36.8 C) (10/23 0000) Temp src: Oral (10/23 0000) BP: 83/57 mmHg (10/23 0130) Pulse Rate: 84 (10/23 0130)  Labs:  Recent Labs  11/17/12 1500 11/17/12 1530 11/17/12 1650 11/17/12 1915 11/17/12 2235 11/18/12 0230 11/19/12 0845 11/20/12 0554  HGB 13.3 14.1  --   --   --  13.2  --  12.8*  HCT 39.0 42.0  --   --   --  39.6  --  39.3  PLT  --  391  --   --   --  423*  --  481*  APTT  --  76*  --   --   --   --   --   --   LABPROT  --  17.3*  --   --   --   --   --   --   INR  --  1.45  --   --   --   --   --   --   HEPARINUNFRC  --   --   --   --   --   --  <0.10* <0.10*  CREATININE 0.50 0.69  --  0.72  --  0.73  --   --   TROPONINI  --   --  >20.00*  --  >20.00* >20.00*  --   --     Estimated Creatinine Clearance: 106.5 ml/min (by C-G formula based on Cr of 0.73).  Assessment: 57 year old male started on Heparin for atrial fibrillation.   Goal of Therapy:  Heparin level 0.3-0.7 units/ml Monitor platelets by anticoagulation protocol: Yes   Plan:  Heparin 3000 units IV bolus, then increase heparin 1850 units/hr  Check heparin level in 6 hours.  Geannie Risen, PharmD, BCPS  11/20/2012, 7:00 AM

## 2012-11-20 NOTE — Progress Notes (Signed)
ANTICOAGULATION CONSULT NOTE - Follow Up Consult  Pharmacy Consult for Heparin>>warfarin  Indication: atrial fibrillation  Allergies  Allergen Reactions  . Codeine     Red blotches  . Penicillins     Childhood reaction    Patient Measurements: Height: 5\' 10"  (177.8 cm) Weight: 187 lb 6.3 oz (85 kg) IBW/kg (Calculated) : 73  Vital Signs: Temp: 98.5 F (36.9 C) (10/23 1130) Temp src: Oral (10/23 1130) BP: 86/57 mmHg (10/23 1400) Pulse Rate: 97 (10/23 1400)  Labs:  Recent Labs  11/17/12 1650 11/17/12 1915 11/17/12 2235 11/18/12 0230 11/19/12 0845 11/20/12 0554 11/20/12 1418  HGB  --   --   --  13.2  --  12.8*  --   HCT  --   --   --  39.6  --  39.3  --   PLT  --   --   --  423*  --  481*  --   LABPROT  --   --   --   --   --   --  13.3  INR  --   --   --   --   --   --  1.03  HEPARINUNFRC  --   --   --   --  <0.10* <0.10* 0.12*  CREATININE  --  0.72  --  0.73  --  0.69  --   TROPONINI >20.00*  --  >20.00* >20.00*  --   --   --     Estimated Creatinine Clearance: 106.5 ml/min (by C-G formula based on Cr of 0.69).   Medications:  Infusions:  . sodium chloride 100 mL/hr at 11/20/12 1100  . heparin 1,850 Units/hr (11/20/12 1100)    Assessment: 57 year old male started on Heparin for atrial fibrillation.  His heparin level is still low at 0.12 will adjust. New orders received to start warfarin tonight, INR has returned to a normal baseline level of 1.0.  Goal of Therapy:  INR goal 2-3 Heparin level 0.3-0.7 units/ml Monitor platelets by anticoagulation protocol: Yes   Plan:  Increase Heparin to 2150 units/hr Check Heparin level and CBC in 6 hours Warfarin 7.5 mg tonight  Sheppard Coil PharmD., BCPS Clinical Pharmacist Pager [phone] 11/20/2012 4:18 PM

## 2012-11-20 NOTE — Progress Notes (Signed)
ANTICOAGULATION CONSULT NOTE - Follow Up Consult  Pharmacy Consult for Heparin/Warfarin Indication: atrial fibrillation  Allergies  Allergen Reactions  . Codeine     Red blotches  . Penicillins     Childhood reaction    Patient Measurements: Height: 5\' 10"  (177.8 cm) Weight: 187 lb 6.3 oz (85 kg) IBW/kg (Calculated) : 73 Vital Signs: Temp: 98.5 F (36.9 C) (10/23 1130) Temp src: Oral (10/23 1130) BP: 93/63 mmHg (10/23 2200) Pulse Rate: 90 (10/23 2200)  Labs:  Recent Labs  11/18/12 0230  11/20/12 0554 11/20/12 1418 11/20/12 2205  HGB 13.2  --  12.8*  --   --   HCT 39.6  --  39.3  --   --   PLT 423*  --  481*  --   --   LABPROT  --   --   --  13.3  --   INR  --   --   --  1.03  --   HEPARINUNFRC  --   < > <0.10* 0.12* 0.25*  CREATININE 0.73  --  0.69  --   --   TROPONINI >20.00*  --   --   --   --   < > = values in this interval not displayed.  Estimated Creatinine Clearance: 106.5 ml/min (by C-G formula based on Cr of 0.69).   Medications:  -Heparin at 2150 units/hr  Assessment: 57 y/o M on heparin/warfarin for afib. HL is still slightly low at 0.25<0.12 after rate increase. Hgb 12.8, Plts 481, renal function stable, no bleeding noted.   Goal of Therapy:  INR 2-3 Heparin level 0.3-0.7 units/ml Monitor platelets by anticoagulation protocol: Yes   Plan:  -Increase heparin drip to 2300 units/hr -Check HL with AM labs -Daily CBC/HL -Warfarin per previous note -Monitor for bleeding  Thank you for allowing me to take part in this patient's care,  Abran Duke, PharmD Clinical Pharmacist Phone: [phone] Pager: [phone] 11/20/2012 11:11 PM

## 2012-11-20 NOTE — Progress Notes (Signed)
ANTIBIOTIC CONSULT NOTE - INITIAL  Pharmacy Consult for Levaquin Indication: pneumonia  Allergies  Allergen Reactions  . Codeine     Red blotches  . Penicillins     Childhood reaction    Patient Measurements: Height: 5\' 10"  (177.8 cm) Weight: 187 lb 6.3 oz (85 kg) IBW/kg (Calculated) : 73 Adjusted Body Weight:   Vital Signs: Temp: 98.5 F (36.9 C) (10/23 1130) Temp src: Oral (10/23 1130) BP: 107/77 mmHg (10/23 1700) Pulse Rate: 88 (10/23 1700) Intake/Output from previous day: 10/22 0701 - 10/23 0700 In: 3080.1 [P.O.:840; I.V.:2090.1; IV Piggyback:150] Out: 1100 [Urine:1100] Intake/Output from this shift: Total I/O In: 1129.5 [I.V.:1129.5] Out: 1225 [Urine:1225]  Labs:  Recent Labs  11/17/12 1915 11/18/12 0230 11/20/12 0554  WBC  --  13.2* 15.4*  HGB  --  13.2 12.8*  PLT  --  423* 481*  CREATININE 0.72 0.73 0.69   Estimated Creatinine Clearance: 106.5 ml/min (by C-G formula based on Cr of 0.69). No results found for this basename: VANCOTROUGH, Leodis Binet, VANCORANDOM, GENTTROUGH, GENTPEAK, GENTRANDOM, TOBRATROUGH, TOBRAPEAK, TOBRARND, AMIKACINPEAK, AMIKACINTROU, AMIKACIN,  in the last 72 hours   Microbiology: Recent Results (from the past 720 hour(s))  MRSA PCR SCREENING     Status: None   Collection Time    11/17/12  4:23 PM      Result Value Range Status   MRSA by PCR NEGATIVE  NEGATIVE Final   Comment:            The GeneXpert MRSA Assay (FDA     approved for NASAL specimens     only), is one component of a     comprehensive MRSA colonization     surveillance program. It is not     intended to diagnose MRSA     infection nor to guide or     monitor treatment for     MRSA infections.    Medical History: Past Medical History  Diagnosis Date  . Tobacco abuse   . COPD (chronic obstructive pulmonary disease)     Medications:  Assessment: 56yom to continue on levofloxacin for suspected PNA. Patient received levofloxacin 750mg  IV on 10/21 and  10/22 but only ordered x 2 doses. Confirmed with Dr. Butler Denmark that acceptable to restart with oral. MD is aware patient is also on amiodarone. - Afebrile, WBC up to 15.4 - Crcl >100 ml/min  Goal of Therapy:  Infection eradication  Plan:  1. Levofloxacin 750mg  PO daily  2. Follow-up renal function, clinical status and monitor QTc closely (patient is also on amiodarone)  Cleon Dew [phone] 11/20/2012,5:50 PM

## 2012-11-20 NOTE — Progress Notes (Addendum)
Subjective: No complaints.   Objective: Vital signs in last 24 hours: Temp:  [98 F (36.7 C)-99.1 F (37.3 C)] 99.1 F (37.3 C) (10/23 0730) Pulse Rate:  [43-123] 90 (10/23 0800) Resp:  [16-34] 20 (10/23 0800) BP: (75-112)/(42-83) 96/71 mmHg (10/23 0800) SpO2:  [89 %-100 %] 93 % (10/23 0800) Last BM Date: 11/16/12  Intake/Output from previous day: 10/22 0701 - 10/23 0700 In: 2980.1 [P.O.:840; I.V.:1990.1; IV Piggyback:150] Out: 1100 [Urine:1100] Intake/Output this shift: Total I/O In: -  Out: 300 [Urine:300]  Medications Current Facility-Administered Medications  Medication Dose Route Frequency Provider Last Rate Last Dose  . 0.9 %  sodium chloride infusion  250 mL Intravenous PRN Joline Salt Barrett, PA-C   250 mL at 11/17/12 2210  . 0.9 %  sodium chloride infusion   Intravenous Continuous Eda Paschal Kilroy, PA-C 100 mL/hr at 11/19/12 1408    . acetaminophen (TYLENOL) tablet 650 mg  650 mg Oral Q4H PRN Marykay Lex, MD   650 mg at 11/18/12 0359  . ALPRAZolam Prudy Feeler) tablet 0.5 mg  0.5 mg Oral BID PRN Leeann Must, MD   0.5 mg at 11/19/12 2123  . amiodarone (PACERONE) tablet 400 mg  400 mg Oral BID Chrystie Nose, MD   400 mg at 11/19/12 2124  . antiseptic oral rinse (BIOTENE) solution 15 mL  15 mL Mouth Rinse BID Marykay Lex, MD   15 mL at 11/20/12 0740  . aspirin EC tablet 81 mg  81 mg Oral Daily Rhonda G Barrett, PA-C   81 mg at 11/19/12 1009  . atorvastatin (LIPITOR) tablet 10 mg  10 mg Oral q1800 Abelino Derrick, PA-C   10 mg at 11/19/12 1703  . guaifenesin (ROBITUSSIN) 100 MG/5ML syrup 400 mg  400 mg Oral TID Marykay Lex, MD   400 mg at 11/19/12 2124  . heparin ADULT infusion 100 units/mL (25000 units/250 mL)  1,850 Units/hr Intravenous Continuous Marykay Lex, MD 18.5 mL/hr at 11/20/12 0740 1,850 Units/hr at 11/20/12 0740  . ipratropium (ATROVENT) nebulizer solution 0.5 mg  0.5 mg Nebulization QID Lonia Blood, MD   0.5 mg at 11/20/12 0737  . levalbuterol  (XOPENEX) nebulizer solution 0.63 mg  0.63 mg Nebulization QID Lonia Blood, MD   0.63 mg at 11/20/12 0738  . morphine 2 MG/ML injection 2 mg  2 mg Intravenous Q1H PRN Marykay Lex, MD      . nicotine (NICODERM CQ - dosed in mg/24 hours) patch 14 mg  14 mg Transdermal Daily Marykay Lex, MD   14 mg at 11/19/12 1009  . nitroGLYCERIN (NITROSTAT) SL tablet 0.4 mg  0.4 mg Sublingual Q5 Min x 3 PRN Rhonda G Barrett, PA-C      . ondansetron (ZOFRAN) injection 4 mg  4 mg Intravenous Q6H PRN Rhonda G Barrett, PA-C      . predniSONE (DELTASONE) tablet 60 mg  60 mg Oral QAC breakfast Kathlen Mody, MD   60 mg at 11/20/12 0733  . sodium chloride 0.9 % injection 3 mL  3 mL Intravenous Q12H Rhonda G Barrett, PA-C   3 mL at 11/19/12 2124  . sodium chloride 0.9 % injection 3 mL  3 mL Intravenous PRN Rhonda G Barrett, PA-C      . Ticagrelor (BRILINTA) tablet 90 mg  90 mg Oral BID Wilburt Finlay, PA-C   90 mg at 11/19/12 2123  . zolpidem (AMBIEN) tablet 5 mg  5 mg Oral QHS PRN  Joline Salt Barrett, PA-C   5 mg at 11/18/12 0019    PE:   Lab Results:   Recent Labs  11/17/12 1530 11/18/12 0230 11/20/12 0554  WBC 10.8* 13.2* 15.4*  HGB 14.1 13.2 12.8*  HCT 42.0 39.6 39.3  PLT 391 423* 481*   BMET  Recent Labs  11/17/12 1915 11/18/12 0230 11/20/12 0554  NA 135 135 135  K 3.7 4.1 4.3  CL 94* 96 98  CO2 27 27 28   GLUCOSE 127* 103* 97  BUN 14 13 15   CREATININE 0.72 0.73 0.69  CALCIUM 9.1 8.7 8.5   PT/INR  Recent Labs  11/17/12 1530  LABPROT 17.3*  INR 1.45   Cholesterol  Recent Labs  11/18/12 0230  CHOL 128     Assessment/Plan  Principal Problem:   STEMI of inferior wall 11/18/12- RCA BMS Active Problems:   COPD (chronic obstructive pulmonary disease)   Pneumonia- Rt   Smoker   Dyslipidemia- low HDL   Hypotension- post inferior MI   Atrial fibrillation with RVR   Cardiogenic shock  Plan: Pt converted to NSR overnight. HR in the 90s. Continue on PO Amiodarone BID.  Will place on Warfarin for AC. Continue DAPT for newly placed stent. He is still hypotensive but asymptomatic. EF ~45%. Will continue IVFs. Will continue to monitor.     LOS: 3 days    Brittainy M. Sharol Harness, PA-C 11/20/2012 9:07 AM  I have seen and examined the patient along with Brittainy M. Sharol Harness, PA-C.  I have reviewed the chart, notes and new data.  I agree with PA's note.  Key new complaints: he feels great, but has yet to get out of bed Key examination changes: remains hypotensive, wheezing, but no wet rales, no edema, mildly elevated JVP Key new findings / data: echo shows mild LV systolic dysfunction and confirms clinical impression of RV infarction; maintains NSR  PLAN: Cardiogenic shock due to RV infarction, but does not appear to require inotropes at this time. COPD and chronic RV overload contributes to RV dysfunction. Avoid beta blockers and ACEinh/ARB due to hypotension. Suspect we will see improvement in BP in next 24-48 hours as RV stunning improves, keep on IV fluids until then. Will need at least 30 days of antiarrhythmic therapy - continue amiodarone. Long term, preferably will keep off antiarrhythmics unless arrhythmia recurs frequently. Needs ASA+ticagrelor+anticoagulant for 30 days, then ticagrelor+anticoagulant for 12 months. He says that he was an "easy bleeder" even before these medications were started. It may be wiser to use warfarin rather than a novel anticoagulant - will begin transition from heparin today. Keep in ICU until BP improves. Discussed smoking cessation at length. He appears committed, but he has quit several times and relapsed after 2-3 months. Reviewed methods to achieve long term success.  Thurmon Fair, MD, [facility] [phone] 11/20/2012, 9:23 AM

## 2012-11-21 DIAGNOSIS — I2129 ST elevation (STEMI) myocardial infarction involving other sites: Secondary | ICD-10-CM

## 2012-11-21 LAB — CBC
HCT: 38.8 % — ABNORMAL LOW (ref 39.0–52.0)
Hemoglobin: 12.5 g/dL — ABNORMAL LOW (ref 13.0–17.0)
MCH: 29.9 pg (ref 26.0–34.0)
MCHC: 32.2 g/dL (ref 30.0–36.0)
RDW: 13.8 % (ref 11.5–15.5)

## 2012-11-21 LAB — PROTIME-INR: Prothrombin Time: 14.2 seconds (ref 11.6–15.2)

## 2012-11-21 LAB — HEPARIN LEVEL (UNFRACTIONATED): Heparin Unfractionated: 0.39 IU/mL (ref 0.30–0.70)

## 2012-11-21 MED ORDER — APIXABAN 5 MG PO TABS
5.0000 mg | ORAL_TABLET | Freq: Two times a day (BID) | ORAL | Status: DC
  Administered 2012-11-21 – 2012-11-22 (×3): 5 mg via ORAL
  Filled 2012-11-21 (×5): qty 1

## 2012-11-21 NOTE — Progress Notes (Signed)
CARDIAC REHAB PHASE I   PRE:  Rate/Rhythm: 94 SR    BP: sitting 121/95    SaO2: 91 4L  MODE:  Ambulation: 460 ft   POST:  Rate/Rhythm: 96 SR    BP: sitting 141/87     SaO2: 88-91 4L  Pt fairly steady walking but very congested. Sts he doesn't feel very SOB. SaO2 borderline on 4L O2. Right now will need 24 hr O2. Wears 4L at night for years. DBP elevated. To recliner. Began ed. with daughter present.  Motivated to quit smoking. Gave resources. Pt interested in [facility] and will send referral to [place] [facility]. Will f/u am for ambulation and more ed. [phone]  Elissa Lovett Raymond CES, ACSM 11/21/2012 12:30 PM

## 2012-11-21 NOTE — Progress Notes (Signed)
ANTICOAGULATION CONSULT NOTE - Follow Up Consult  Pharmacy Consult for heparin Indication: atrial fibrillation  Labs:  Recent Labs  11/20/12 0554 11/20/12 1418 11/20/12 2205 11/21/12 0530  HGB 12.8*  --   --  12.5*  HCT 39.3  --   --  38.8*  PLT 481*  --   --  556*  LABPROT  --  13.3  --  14.2  INR  --  1.03  --  1.12  HEPARINUNFRC <0.10* 0.12* 0.25* 0.39  CREATININE 0.69  --   --   --     Assessment/Plan:  57yo male now therapeutic on heparin after rate increases.  Will continue gtt at current rate and confirm stable with additional level.  Vernard Gambles, PharmD, BCPS  11/21/2012,6:21 AM

## 2012-11-21 NOTE — Progress Notes (Addendum)
DAILY PROGRESS NOTE  Subjective:  No events overnight. Feeling better. Blood pressure is slowly improving. Maintaining sinus at 87 on amidarone po.  Objective:  Temp:  [97.9 F (36.6 C)-98.5 F (36.9 C)] 97.9 F (36.6 C) (10/24 0700) Pulse Rate:  [73-97] 81 (10/24 0800) Resp:  [12-32] 12 (10/24 0800) BP: (85-117)/(54-84) 117/83 mmHg (10/24 0800) SpO2:  [87 %-98 %] 98 % (10/24 0800) Weight:  [194 lb 10.7 oz (88.3 kg)] 194 lb 10.7 oz (88.3 kg) (10/24 0500) Weight change:   Intake/Output from previous day: 10/23 0701 - 10/24 0700 In: 2842.5 [I.V.:2842.5] Out: 1475 [Urine:1475]  Intake/Output from this shift: Total I/O In: 123 [I.V.:123] Out: -   Medications: Current Facility-Administered Medications  Medication Dose Route Frequency Provider Last Rate Last Dose  . 0.9 %  sodium chloride infusion  250 mL Intravenous PRN Joline Salt Barrett, PA-C   250 mL at 11/17/12 2210  . 0.9 %  sodium chloride infusion   Intravenous Continuous Abelino Derrick, PA-C 100 mL/hr at 11/21/12 9604    . acetaminophen (TYLENOL) tablet 650 mg  650 mg Oral Q4H PRN Marykay Lex, MD   650 mg at 11/20/12 2243  . ALPRAZolam Prudy Feeler) tablet 0.5 mg  0.5 mg Oral BID PRN Leeann Must, MD   0.5 mg at 11/21/12 0000  . amiodarone (PACERONE) tablet 400 mg  400 mg Oral BID Chrystie Nose, MD   400 mg at 11/20/12 2111  . antiseptic oral rinse (BIOTENE) solution 15 mL  15 mL Mouth Rinse BID Marykay Lex, MD   15 mL at 11/21/12 0742  . aspirin EC tablet 81 mg  81 mg Oral Daily Joline Salt Barrett, PA-C   81 mg at 11/20/12 0958  . atorvastatin (LIPITOR) tablet 10 mg  10 mg Oral q1800 Eda Paschal Kilroy, PA-C   10 mg at 11/20/12 1700  . budesonide-formoterol (SYMBICORT) 160-4.5 MCG/ACT inhaler 2 puff  2 puff Inhalation BID Calvert Cantor, MD   2 puff at 11/21/12 0750  . guaifenesin (ROBITUSSIN) 100 MG/5ML syrup 400 mg  400 mg Oral TID Marykay Lex, MD   400 mg at 11/20/12 2111  . heparin ADULT infusion 100 units/mL (25000  units/250 mL)  2,300 Units/hr Intravenous Continuous Abran Duke, RPH 23 mL/hr at 11/21/12 0626 2,300 Units/hr at 11/21/12 0626  . ipratropium (ATROVENT) nebulizer solution 0.5 mg  0.5 mg Nebulization QID Lonia Blood, MD   0.5 mg at 11/21/12 0749  . levalbuterol (XOPENEX) nebulizer solution 0.63 mg  0.63 mg Nebulization QID Lonia Blood, MD   0.63 mg at 11/21/12 0750  . levofloxacin (LEVAQUIN) tablet 750 mg  750 mg Oral Q24H Dannielle Karvonen [place], RPH   750 mg at 11/20/12 1930  . morphine 2 MG/ML injection 2 mg  2 mg Intravenous Q1H PRN Marykay Lex, MD      . nicotine (NICODERM CQ - dosed in mg/24 hours) patch 14 mg  14 mg Transdermal Daily Marykay Lex, MD   14 mg at 11/20/12 1003  . nitroGLYCERIN (NITROSTAT) SL tablet 0.4 mg  0.4 mg Sublingual Q5 Min x 3 PRN Rhonda G Barrett, PA-C      . ondansetron (ZOFRAN) injection 4 mg  4 mg Intravenous Q6H PRN Rhonda G Barrett, PA-C      . sodium chloride 0.9 % injection 3 mL  3 mL Intravenous Q12H Rhonda G Barrett, PA-C   3 mL at 11/20/12 0959  . sodium chloride 0.9 %  injection 3 mL  3 mL Intravenous PRN Rhonda G Barrett, PA-C      . Ticagrelor (BRILINTA) tablet 90 mg  90 mg Oral BID Wilburt Finlay, PA-C   90 mg at 11/20/12 2111  . Warfarin - Pharmacist Dosing Inpatient   Does not apply q1800 Severiano Gilbert, [facility]      . zolpidem [facility]) tablet 5 mg  5 mg Oral QHS PRN Darrol Jump, PA-C   5 mg at 11/18/12 0019    Physical Exam: General appearance: alert and no distress Neck: no carotid bruit and no JVD Lungs: diminished breath sounds bilaterally Heart: regular rate and rhythm, S1, S2 normal, no murmur, click, rub or gallop Abdomen: soft, non-tender; bowel sounds normal; no masses,  no organomegaly and protuberant Extremities: extremities normal, atraumatic, no cyanosis or edema Pulses: 2+ and symmetric Skin: Skin color, texture, turgor normal. No rashes or lesions Neurologic: Mental status: Alert, oriented, thought content  appropriate Psych: Does not appear anxious today  Lab Results: Results for orders placed during the hospital encounter of 11/17/12 (from the past 48 hour(s))  HEPARIN LEVEL (UNFRACTIONATED)     Status: Abnormal   Collection Time    11/19/12  8:45 AM      Result Value Range   Heparin Unfractionated <0.10 (*) 0.30 - 0.70 IU/mL   Comment:            IF HEPARIN RESULTS ARE BELOW     EXPECTED VALUES, AND PATIENT     DOSAGE HAS BEEN CONFIRMED,     SUGGEST FOLLOW UP TESTING     OF ANTITHROMBIN III LEVELS.  BASIC METABOLIC PANEL     Status: None   Collection Time    11/20/12  5:54 AM      Result Value Range   Sodium 135  135 - 145 mEq/L   Potassium 4.3  3.5 - 5.1 mEq/L   Chloride 98  96 - 112 mEq/L   CO2 28  19 - 32 mEq/L   Glucose, Bld 97  70 - 99 mg/dL   BUN 15  6 - 23 mg/dL   Creatinine, Ser 8.41  0.50 - 1.35 mg/dL   Calcium 8.5  8.4 - 32.4 mg/dL   GFR calc non Af Amer >90  >90 mL/min   GFR calc Af Amer >90  >90 mL/min   Comment: (NOTE)     The eGFR has been calculated using the CKD EPI equation.     This calculation has not been validated in all clinical situations.     eGFR's persistently <90 mL/min signify possible Chronic Kidney     Disease.  TSH     Status: None   Collection Time    11/20/12  5:54 AM      Result Value Range   TSH 0.430  0.350 - 4.500 uIU/mL   Comment: Performed at [employer] [facility]  MAGNESIUM     Status: None   Collection Time    11/20/12  5:54 AM      Result Value Range   Magnesium 2.4  1.5 - 2.5 mg/dL  CBC     Status: Abnormal   Collection Time    11/20/12  5:54 AM      Result Value Range   WBC 15.4 (*) 4.0 - 10.5 K/uL   RBC 4.29  4.22 - 5.81 MIL/uL   Hemoglobin 12.8 (*) 13.0 - 17.0 g/dL   HCT 40.1  02.7 - 25.3 %   MCV 91.6  78.0 -  100.0 fL   MCH 29.8  26.0 - 34.0 pg   MCHC 32.6  30.0 - 36.0 g/dL   RDW 78.2  95.6 - 21.3 %   Platelets 481 (*) 150 - 400 K/uL  HEPARIN LEVEL (UNFRACTIONATED)     Status: Abnormal   Collection Time     11/20/12  5:54 AM      Result Value Range   Heparin Unfractionated <0.10 (*) 0.30 - 0.70 IU/mL   Comment:            IF HEPARIN RESULTS ARE BELOW     EXPECTED VALUES, AND PATIENT     DOSAGE HAS BEEN CONFIRMED,     SUGGEST FOLLOW UP TESTING     OF ANTITHROMBIN III LEVELS.  HEPARIN LEVEL (UNFRACTIONATED)     Status: Abnormal   Collection Time    11/20/12  2:18 PM      Result Value Range   Heparin Unfractionated 0.12 (*) 0.30 - 0.70 IU/mL   Comment:            IF HEPARIN RESULTS ARE BELOW     EXPECTED VALUES, AND PATIENT     DOSAGE HAS BEEN CONFIRMED,     SUGGEST FOLLOW UP TESTING     OF ANTITHROMBIN III LEVELS.  PROTIME-INR     Status: None   Collection Time    11/20/12  2:18 PM      Result Value Range   Prothrombin Time 13.3  11.6 - 15.2 seconds   INR 1.03  0.00 - 1.49  HEPARIN LEVEL (UNFRACTIONATED)     Status: Abnormal   Collection Time    11/20/12 10:05 PM      Result Value Range   Heparin Unfractionated 0.25 (*) 0.30 - 0.70 IU/mL   Comment:            IF HEPARIN RESULTS ARE BELOW     EXPECTED VALUES, AND PATIENT     DOSAGE HAS BEEN CONFIRMED,     SUGGEST FOLLOW UP TESTING     OF ANTITHROMBIN III LEVELS.  CBC     Status: Abnormal   Collection Time    11/21/12  5:30 AM      Result Value Range   WBC 13.5 (*) 4.0 - 10.5 K/uL   RBC 4.18 (*) 4.22 - 5.81 MIL/uL   Hemoglobin 12.5 (*) 13.0 - 17.0 g/dL   HCT 08.6 (*) 57.8 - 46.9 %   MCV 92.8  78.0 - 100.0 fL   MCH 29.9  26.0 - 34.0 pg   MCHC 32.2  30.0 - 36.0 g/dL   RDW 62.9  52.8 - 41.3 %   Platelets 556 (*) 150 - 400 K/uL  HEPARIN LEVEL (UNFRACTIONATED)     Status: None   Collection Time    11/21/12  5:30 AM      Result Value Range   Heparin Unfractionated 0.39  0.30 - 0.70 IU/mL   Comment:            IF HEPARIN RESULTS ARE BELOW     EXPECTED VALUES, AND PATIENT     DOSAGE HAS BEEN CONFIRMED,     SUGGEST FOLLOW UP TESTING     OF ANTITHROMBIN III LEVELS.  PROTIME-INR     Status: None   Collection Time     11/21/12  5:30 AM      Result Value Range   Prothrombin Time 14.2  11.6 - 15.2 seconds   INR 1.12  0.00 - 1.49  Imaging: No results found.  Assessment:  1. Principal Problem: 2.   STEMI of inferior wall 11/18/12- RCA BMS 3. Active Problems: 4.   COPD (chronic obstructive pulmonary disease) 5.   Pneumonia- Rt 6.   Smoker 7.   Dyslipidemia- low HDL 8.   Hypotension- post inferior MI 9.   Atrial fibrillation with RVR 10.   Cardiogenic shock 11.   Plan:  1. He continues to improve, likely recovering some RV function. More tolerant of sinus rhythm. Continue amiodarone loading BID for 1 week total and then reduce to 400 mg daily. Recheck LFT's in the am - ALT was 102 -> 162, however, this was in the setting of acute MI. Re-check troponin in AM as well to demonstrate improvement, was still >20 - will help gauge the size of the infarct. Ambulate with cardiac rehab today. Continue on steroids and antibiotics for COPD exacerbation/PNA.  Will need to switch to oral anticoagulation for a-fib - concerned about bleeding risk on warfarin, brillinta and aspirin in the setting of recent stent. I would recommend discontinuing heparin and transition to Eliquis. Could maintain on triple anticoagulation for 1 month then switch Brillinta to Plavix (with appropriate P2y12 level). Ok to transfer to telemetry. Anticipate d/c possibly tomorrow.  Time Spent Directly with Patient:  15 minutes  Length of Stay:  LOS: 4 days   Chrystie Nose, MD, [facility] Attending Cardiologist [facility]  Aveleen Nevers C 11/21/2012, 8:11 AM

## 2012-11-21 NOTE — Progress Notes (Signed)
ANTICOAGULATION CONSULT NOTE - Follow Up Consult  Pharmacy Consult for Heparin/Warfarin ->Apixaban Indication: atrial fibrillation  Allergies  Allergen Reactions  . Codeine     Red blotches  . Penicillins     Childhood reaction    Patient Measurements: Height: 5\' 10"  (177.8 cm) Weight: 194 lb 10.7 oz (88.3 kg) IBW/kg (Calculated) : 73 Vital Signs: Temp: 97.9 F (36.6 C) (10/24 0700) Temp src: Oral (10/24 0700) BP: 116/79 mmHg (10/24 0906) Pulse Rate: 100 (10/24 0906)  Labs:  Recent Labs  11/20/12 0554 11/20/12 1418 11/20/12 2205 11/21/12 0530  HGB 12.8*  --   --  12.5*  HCT 39.3  --   --  38.8*  PLT 481*  --   --  556*  LABPROT  --  13.3  --  14.2  INR  --  1.03  --  1.12  HEPARINUNFRC <0.10* 0.12* 0.25* 0.39  CREATININE 0.69  --   --   --     Estimated Creatinine Clearance: 115.4 ml/min (by C-G formula based on Cr of 0.69).  Assessment: 57 y/o M on heparin/warfarin for afib. Now to transition to apixaban. CBC stable. No bleeding noted.   Goal of Therapy:  Prevention of stroke   Plan:  1) Apixaban 5mg  po twice daily 2) D/c heparin when apixaban given 3) D/c daily heparin levels and INR  Thank you for allowing me to take part in this patient's care,  Christoper Fabian, PharmD, BCPS Clinical pharmacist, pager [phone] 11/21/2012 9:38 AM

## 2012-11-21 NOTE — Progress Notes (Signed)
[facility] CONSULT F/U Note [place] TEAM 1 - Stepdown/ICU TEAM   Dolphus Jenny ZOX:[id number] DOB: 1955-11-02 DOA: 11/17/2012 PCP: Cassell Smiles., MD  Admit HPI / Brief Narrative: 57 year old gentleman with prior h/o COPD, on nasal oxygen at 4 li/min only at night at home, active smoker, came in for substernal chest pain on 10/19, intermittent. He was found to have STEMI and was admitted by Cardiology service and underwent cardiac cath showed thrombotic occlusion at mid RCA, underwent BMS stent. He was also found to have right basilar infiltrate and was started on levaquin. He continued to require 4 lit [place] oxygen and [facility] was consulted for management of COPD and tx of his PNA.  Assessment/Plan:  RLL CAP Review of old xrays reveal similar appearing abnormality dating back to May 2013 - suspect pt has severe bullous emphysema - CT of chest and spirometry will be useful once other issues stable (can be done as oupt) - Recommend that course of Levaquin total 5 days (today is day 3) - repeat CXR in 1-2 wks  COPD w/ ongoing tobacco abuse  See comments above - given relatively young age, ?if alpha 1 antitrypsin deficiency is contributing - results pending - question underlying asthma - cont to tx expiratory wheeze w/ nebs and steroids - minimize nebs as much as possible in setting of Afib/flutter - Taper Prednisone- 40 mg tomorrow, 20 the next day, 10 the following day and 5 for 2 more days - pt will need Xanax for sleep while on steroid as it is resulting in Insomnia.  - will need O2 conserving device for home  Nicotine abuse - recommend Nicoderm patches- 14 mg for 1 more week and then 7 mg for 2 wks.   STEMI Per Cardiology/primary service  New onset afib/flutter w/ RVR Per Cardiology/primary service  Code Status: FULL Family Communication: with daughter at bedside  Antibiotics: Levaquin 10/21 >>  DVT prophylaxis: IV heparin  HPI/Subjective: The patient continues  to cough up sputum- discussed that he will go back on all his inhalers once he gets home along with a prednisone taper and antibiotics. Need oupt Pulm eval and PFTs.    Objective: Blood pressure 114/89, pulse 87, temperature 97.9 F (36.6 C), temperature source Oral, resp. rate 18, height 5\' 10"  (1.778 m), weight 88.3 kg (194 lb 10.7 oz), SpO2 93.00%.  Intake/Output Summary (Last 24 hours) at 11/21/12 1323 Last data filed at 11/21/12 1000  Gross per 24 hour  Intake   2796 ml  Output    950 ml  Net   1846 ml   Exam: General: No acute respiratory distress when laying in bed Lungs: Distant air movement throughout all fields with mild expiratory wheeze on left lung field,  Right lower lung field crackles, but no prolonged expiratory phase Cardiovascular: Irregular - no appreciable gallop or rub Abdomen: Nontender, nondistended, soft, bowel sounds positive, no rebound, no ascites, no appreciable mass Extremities: No significant cyanosis, clubbing, or edema bilateral lower extremities  Data Reviewed: Basic Metabolic Panel:  Recent Labs Lab 11/17/12 1500 11/17/12 1530 11/17/12 1915 11/18/12 0230 11/20/12 0554  NA 116* 131* 135 135 135  K 3.4* 4.2 3.7 4.1 4.3  CL 81* 93* 94* 96 98  CO2  --  27 27 27 28   GLUCOSE 73 97 127* 103* 97  BUN 13 14 14 13 15   CREATININE 0.50 0.69 0.72 0.73 0.69  CALCIUM  --  9.3 9.1 8.7 8.5  MG  --   --   --   --  2.4   Liver Function Tests:  Recent Labs Lab 11/17/12 1915 11/18/12 0230  AST 102* 162*  ALT 26 32  ALKPHOS 113 101  BILITOT 0.4 0.4  PROT 7.1 6.4  ALBUMIN 2.5* 2.2*   CBC:  Recent Labs Lab 11/17/12 1500 11/17/12 1530 11/18/12 0230 11/20/12 0554 11/21/12 0530  WBC  --  10.8* 13.2* 15.4* 13.5*  NEUTROABS  --  9.2*  --   --   --   HGB 13.3 14.1 13.2 12.8* 12.5*  HCT 39.0 42.0 39.6 39.3 38.8*  MCV  --  89.7 90.6 91.6 92.8  PLT  --  391 423* 481* 556*   Cardiac Enzymes:  Recent Labs Lab 11/17/12 1650 11/17/12 2235  11/18/12 0230  TROPONINI >20.00* >20.00* >20.00*   BNP (last 3 results)  Recent Labs  11/17/12 1650  PROBNP 718.1*    Recent Results (from the past 240 hour(s))  MRSA PCR SCREENING     Status: None   Collection Time    11/17/12  4:23 PM      Result Value Range Status   MRSA by PCR NEGATIVE  NEGATIVE Final   Comment:            The GeneXpert MRSA Assay (FDA     approved for NASAL specimens     only), is one component of a     comprehensive MRSA colonization     surveillance program. It is not     intended to diagnose MRSA     infection nor to guide or     monitor treatment for     MRSA infections.     Studies:  Recent x-ray studies have been reviewed in detail by the Attending Physician  Scheduled Meds:  Scheduled Meds: . amiodarone  400 mg Oral BID  . antiseptic oral rinse  15 mL Mouth Rinse BID  . apixaban  5 mg Oral BID  . aspirin EC  81 mg Oral Daily  . atorvastatin  10 mg Oral q1800  . budesonide-formoterol  2 puff Inhalation BID  . guaifenesin  400 mg Oral TID  . ipratropium  0.5 mg Nebulization QID  . levalbuterol  0.63 mg Nebulization QID  . levofloxacin  750 mg Oral Q24H  . nicotine  14 mg Transdermal Daily  . sodium chloride  3 mL Intravenous Q12H  . Ticagrelor  90 mg Oral BID    Time spent on care of this patient: 25 mins   Calvert Cantor, MD  [facility] Office  [phone] Pager - Text Page per Loretha Stapler as per below:  On-Call/Text Page:      [email or web address]      password [email or web address]  If 7PM-7AM, please contact night-coverage [email or web address] Password [email or web address] 11/21/2012, 1:23 PM   LOS: 4 days

## 2012-11-22 DIAGNOSIS — I4892 Unspecified atrial flutter: Secondary | ICD-10-CM

## 2012-11-22 LAB — COMPREHENSIVE METABOLIC PANEL WITH GFR
ALT: 28 U/L (ref 0–53)
AST: 31 U/L (ref 0–37)
Albumin: 2 g/dL — ABNORMAL LOW (ref 3.5–5.2)
Alkaline Phosphatase: 85 U/L (ref 39–117)
BUN: 15 mg/dL (ref 6–23)
CO2: 29 meq/L (ref 19–32)
Calcium: 8.4 mg/dL (ref 8.4–10.5)
Chloride: 99 meq/L (ref 96–112)
Creatinine, Ser: 0.7 mg/dL (ref 0.50–1.35)
GFR calc Af Amer: >90 mL/min
GFR calc non Af Amer: >90 mL/min
Glucose, Bld: 87 mg/dL (ref 70–99)
Potassium: 4.6 meq/L (ref 3.5–5.1)
Sodium: 136 meq/L (ref 135–145)
Total Bilirubin: 0.2 mg/dL — ABNORMAL LOW (ref 0.3–1.2)
Total Protein: 6 g/dL (ref 6.0–8.3)

## 2012-11-22 LAB — CBC
HCT: 39.7 % (ref 39.0–52.0)
MCH: 30.3 pg (ref 26.0–34.0)
MCHC: 32.5 g/dL (ref 30.0–36.0)
MCV: 93.2 fL (ref 78.0–100.0)
Platelets: 621 10*3/uL — ABNORMAL HIGH (ref 150–400)
RBC: 4.26 MIL/uL (ref 4.22–5.81)
RDW: 13.7 % (ref 11.5–15.5)

## 2012-11-22 LAB — TROPONIN I: Troponin I: 7.63 ng/mL

## 2012-11-22 LAB — ALPHA-1-ANTITRYPSIN: A-1 Antitrypsin, Ser: 380 mg/dL — ABNORMAL HIGH (ref 90–200)

## 2012-11-22 MED ORDER — LEVALBUTEROL HCL 0.63 MG/3ML IN NEBU: 0.6300 mg | INHALATION_SOLUTION | Freq: Four times a day (QID) | RESPIRATORY_TRACT | Status: DC

## 2012-11-22 MED ORDER — ALPRAZOLAM 0.5 MG PO TABS: 0.5000 mg | ORAL_TABLET | Freq: Two times a day (BID) | ORAL | Status: DC | PRN

## 2012-11-22 MED ORDER — APIXABAN 5 MG PO TABS: 5.0000 mg | ORAL_TABLET | Freq: Two times a day (BID) | ORAL | Status: DC

## 2012-11-22 MED ORDER — PREDNISONE 20 MG PO TABS
40.0000 mg | ORAL_TABLET | Freq: Every day | ORAL | Status: DC
  Administered 2012-11-22: 40 mg via ORAL
  Filled 2012-11-22 (×2): qty 2

## 2012-11-22 MED ORDER — PANTOPRAZOLE SODIUM 40 MG PO TBEC: 40.0000 mg | DELAYED_RELEASE_TABLET | Freq: Every day | ORAL | Status: DC

## 2012-11-22 MED ORDER — PANTOPRAZOLE SODIUM 40 MG PO TBEC
40.0000 mg | DELAYED_RELEASE_TABLET | Freq: Every day | ORAL | Status: DC
  Administered 2012-11-22: 40 mg via ORAL

## 2012-11-22 MED ORDER — ASPIRIN 81 MG PO TBEC: 81.0000 mg | DELAYED_RELEASE_TABLET | Freq: Every day | ORAL | Status: DC

## 2012-11-22 MED ORDER — NICOTINE 14 MG/24HR TD PT24: 1.0000 | MEDICATED_PATCH | Freq: Every day | TRANSDERMAL | Status: DC

## 2012-11-22 MED ORDER — CLOPIDOGREL BISULFATE 75 MG PO TABS: 75.0000 mg | ORAL_TABLET | Freq: Every day | ORAL | Status: DC

## 2012-11-22 MED ORDER — LEVOFLOXACIN 750 MG PO TABS: 750.0000 mg | ORAL_TABLET | ORAL | Status: DC

## 2012-11-22 MED ORDER — PREDNISONE 10 MG PO TABS: ORAL_TABLET | ORAL | Status: DC

## 2012-11-22 MED ORDER — AMIODARONE HCL 400 MG PO TABS: 400.0000 mg | ORAL_TABLET | Freq: Every day | ORAL | Status: DC

## 2012-11-22 MED ORDER — AMIODARONE HCL 400 MG PO TABS: 400.0000 mg | ORAL_TABLET | Freq: Two times a day (BID) | ORAL | Status: DC

## 2012-11-22 MED ORDER — NITROGLYCERIN 0.4 MG SL SUBL: 0.4000 mg | SUBLINGUAL_TABLET | SUBLINGUAL | Status: DC | PRN

## 2012-11-22 MED ORDER — ATORVASTATIN CALCIUM 10 MG PO TABS: 10.0000 mg | ORAL_TABLET | Freq: Every day | ORAL | Status: DC

## 2012-11-22 NOTE — Progress Notes (Signed)
Patient ID: Elena Cothern  male  UJW:[id number]    DOB: 06/16/1955    DOA: 11/17/2012  PCP: Cassell Smiles., MD  Assessment/Plan: Principal Problem:   STEMI of inferior wall 11/18/12- RCA BMS Active Problems:   COPD (chronic obstructive pulmonary disease)   Pneumonia- Rt   Smoker   Dyslipidemia- low HDL   Hypotension- post inferior MI   Atrial fibrillation with RVR   Cardiogenic shock  RLL CAP  Review of old xrays reveal similar appearing abnormality dating back to May 2013 - suspect pt has severe bullous emphysema  - Repeat chest x-ray in one to 2 weeks or CT of chest outpatient - wrote prescription for Levaquin x 4 more days   COPD w/ ongoing tobacco abuse  - No alpha-1 antitrypsin deficiency, minimal expiratory wheezing, - placed on prednisone taper ,wrote prescription for Xopenex nebs, continue Spiriva, Symbicort - I have stopped the theophylline, encouraged patient to followup with Dr. Kari Baars  Nicotine abuse  - recommend Nicoderm patches, prescription given   STEMI  Per Cardiology/primary service   New onset afib/flutter w/ RVR  Per Cardiology/primary service  DVT Prophylaxis:  Code Status:  Disposition:Per cardiology service    Subjective: Still coughing, family member at the bedside, he feels that he is ready to go home understands that he will likely need O2 24/7   Objective: Weight change:   Intake/Output Summary (Last 24 hours) at 11/22/12 1048 Last data filed at 11/22/12 0900  Gross per 24 hour  Intake    600 ml  Output   1425 ml  Net   -825 ml   Blood pressure 123/69, pulse 78, temperature 98.3 F (36.8 C), temperature source Oral, resp. rate 20, height 5\' 10"  (1.778 m), weight 88.3 kg (194 lb 10.7 oz), SpO2 95.00%.  Physical Exam: General: Alert and awake, oriented x3, not in any acute distress. CVS: S1-S2 clear, no murmur rubs or gallops Chest: mild expiratory wheezing r>L Abdomen: soft nontender, nondistended, normal bowel  sounds  Extremities: no cyanosis, clubbing or edema noted bilaterally   Lab Results: Basic Metabolic Panel:  Recent Labs Lab 11/20/12 0554 11/22/12 0330  NA 135 136  K 4.3 4.6  CL 98 99  CO2 28 29  GLUCOSE 97 87  BUN 15 15  CREATININE 0.69 0.70  CALCIUM 8.5 8.4  MG 2.4  --    Liver Function Tests:  Recent Labs Lab 11/18/12 0230 11/22/12 0330  AST 162* 31  ALT 32 28  ALKPHOS 101 85  BILITOT 0.4 0.2*  PROT 6.4 6.0  ALBUMIN 2.2* 2.0*   No results found for this basename: LIPASE, AMYLASE,  in the last 168 hours No results found for this basename: AMMONIA,  in the last 168 hours CBC:  Recent Labs Lab 11/17/12 1530  11/21/12 0530 11/22/12 0330  WBC 10.8*  < > 13.5* 10.7*  NEUTROABS 9.2*  --   --   --   HGB 14.1  < > 12.5* 12.9*  HCT 42.0  < > 38.8* 39.7  MCV 89.7  < > 92.8 93.2  PLT 391  < > 556* 621*  < > = values in this interval not displayed. Cardiac Enzymes:  Recent Labs Lab 11/17/12 2235 11/18/12 0230 11/22/12 0330  TROPONINI >20.00* >20.00* 7.63*   BNP: No components found with this basename: POCBNP,  CBG: No results found for this basename: GLUCAP,  in the last 168 hours   Micro Results: Recent Results (from the past 240 hour(s))  MRSA PCR  SCREENING     Status: None   Collection Time    11/17/12  4:23 PM      Result Value Range Status   MRSA by PCR NEGATIVE  NEGATIVE Final   Comment:            The GeneXpert MRSA Assay (FDA     approved for NASAL specimens     only), is one component of a     comprehensive MRSA colonization     surveillance program. It is not     intended to diagnose MRSA     infection nor to guide or     monitor treatment for     MRSA infections.    Studies/Results: X-ray Chest Pa And Lateral  11/18/2012   CLINICAL DATA:  COPD, increasing shortness of breath  EXAM: CHEST  2 VIEW  COMPARISON:  11/17/2012  FINDINGS: The lungs are again hyperinflated. The cardiac shadow is stable. Persistent right basilar  infiltrate is noted. No new focal abnormality is seen.  IMPRESSION: COPD with right basilar infiltrate.   Electronically Signed   By: Alcide Clever M.D.   On: 11/18/2012 07:53   Dg Chest Port 1 View  11/17/2012   CLINICAL DATA:  Cough and COPD.  EXAM: PORTABLE CHEST - 1 VIEW  COMPARISON:  09/18/2011  FINDINGS: Severe COPD with hyperinflation of the lungs. Right apical emphysema.  Large area of infiltrate right lower lobe consistent with pneumonia. This was not present previously. Negative for heart failure or pleural effusion.  IMPRESSION: Severe COPD  Right lower lobe pneumonia.   Electronically Signed   By: Marlan Palau M.D.   On: 11/17/2012 17:27    Medications: Scheduled Meds: . amiodarone  400 mg Oral BID  . antiseptic oral rinse  15 mL Mouth Rinse BID  . apixaban  5 mg Oral BID  . aspirin EC  81 mg Oral Daily  . atorvastatin  10 mg Oral q1800  . budesonide-formoterol  2 puff Inhalation BID  . guaifenesin  400 mg Oral TID  . ipratropium  0.5 mg Nebulization QID  . levalbuterol  0.63 mg Nebulization QID  . levofloxacin  750 mg Oral Q24H  . nicotine  14 mg Transdermal Daily  . pantoprazole  40 mg Oral Q0600  . predniSONE  40 mg Oral Q breakfast  . sodium chloride  3 mL Intravenous Q12H  . Ticagrelor  90 mg Oral BID      LOS: 5 days   Marcellis Frampton M.D. [facility] 11/22/2012, 10:48 AM Pager: [phone]  If 7PM-7AM, please contact night-coverage [email or web address] Password [email or web address]

## 2012-11-22 NOTE — Progress Notes (Signed)
   CARE MANAGEMENT NOTE 11/22/2012  Patient:  Benjamin [name], Benjamin [name]   Account Number:  [id number]  Date Initiated:  11/17/2012  Documentation initiated by:  Junius Creamer  Subjective/Objective Assessment:   adm w mi     Action/Plan:   lives alone, pcp dr Lyman Bishop fusco   Anticipated DC Date:  11/22/2012   Anticipated DC Plan:  HOME/SELF CARE      DC Planning Services  CM consult  Medication Assistance      Choice offered to / List presented to:     DME arranged  [facility]           Status of service:  Completed, signed off Medicare Important Message given?   (If response is "NO", the following Medicare IM given date fields will be blank) Date Medicare IM given:   Date Additional Medicare IM given:    Discharge Disposition:  HOME/SELF CARE  Per UR Regulation:  Reviewed for med. necessity/level of care/duration of stay  If discussed at Long Length of Stay Meetings, dates discussed:    Comments:  11/22/12 17:00 CM called by RN as pt's family went to [facility] to pick up a new tank to find the supplier of pt's 02 has discontinued supplying 02.  Pt has a concentrater at home but is without 02 to make the 45 minute drive.  [facility] kindly explained the "transition" of 02 should have been done by the [employer] with [employer].  [facility] accepts referral and will complete the "transition" with the pt's insurance on Monday 11/24/12 as it cannot be done on the weekend.  [facility] also KINDLY supplied the pt with a tank and states they will work it out so the pt can get home today. Freddy Jaksch, BSN, [place] [phone].  11/22/12 15:25 CM spoke to family concerning and called MD to get paper prescriptions for continuous  02 and for portable device (which according to pt and pt family, as long as they have a prescription and order for continuous 02, their insurance will cover it.)  MD decided not to prescribe Brilinta and pt already has Eliquis from previous encounter with CM.  Pt's family has tank of 02 for  ride home.  RN states she documented pulmonary trial.  No other CM needs were communicated.  Freddy Jaksch, BSN, [place] [phone].  10/24  [phone] debbie dowell rn,bsn pt for poss dc on 10-25. md charted may need eliquis. gave pt eliquis 30day and copay assist card if needed. has concentrator and 1 port tank. he gets oxygen from [facility]. he has not been using o2 24hrs per day. will need order for o2 at 24hrs/day and oxygen conserving device so he can get smaller tanks for mobility.eliquis and brilinta have 70.00 per month copay but pt has copay assist card for both that may bring down.  10/21 0955 debbie dowell rn,bsn spoke w pt and da. pt has [employer] ins. gave pt 30day free and copay assist card for brilinta.

## 2012-11-22 NOTE — Discharge Summary (Signed)
Physician Discharge Summary  Patient ID: Benjamin [name] MRN: [id number] DOB/AGE: March 02, 1955 57 y.o.  Admit date: 11/17/2012 Discharge date: 11/22/2012  Admission Diagnoses: STEMI  Discharge Diagnoses:  Principal Problem:   STEMI of inferior wall 11/18/12- RCA BMS Active Problems:   COPD (chronic obstructive pulmonary disease)   Pneumonia- Rt   Smoker   Dyslipidemia- low HDL   Hypotension- post inferior MI   Atrial fibrillation with RVR   Cardiogenic shock   Discharged Condition: stable  Hospital Course: The patient is a 57 y/o male with a h/o COPD, but no prior cardiac history, who presented to [facility] on 11/17/12 for STEMI. He had initially presented to his PCP office for evaluation of chest pain. There, an EKG demonstrated inferior ST elevations. He was given ASA and SL NTG. EMS was called and he was transported urgently to the hospital for emergent cardiac catheretization. The procedure was performed by Dr. Herbie Baltimore. He was found to have subtotal, thrombotic occlusion of the mid RCA. This was successfully treated with PCI utilizing a BMS.  He had normal systolic function with an EF of 50-55%. He left the cath lab in stable condition. His pain resolved. He was placed initially on DAPT with ASA and Brilinta. Post-cath recovery was complicated by hypotension and atrial fibrillation with RVR.  Electrical cardioversion was attempted. The patient had brief conversion to NSR, then went back into afib/flutter. Amiodarone was initiated and he converted back into NSR. He remained hypotensive, but asymptomatic. His SBPs were in the 80s. Both BB and ACE-I were held temporarily. He was started on Eliquis for anticoagulation for atrial fibrillation. His BP ultimately improved. He was also suspected of having a possible respiratory infection. Internal medicine was consulted. They concluded that he had PNA/COPD exacerbation. He was treated with steroids and antibiotics. He was also started on a nicotine  patch for smoking cessation. The patient showed signs of improvement. Cardiac rehab assisted with ambulation. He had no difficulty. No recurrent chest pain. Respiratory status improved. Prior to discharge, the decision was made to switch from Brilinta to Plavix, to minimize the risk of bleeding. He was last seen and examined by Dr. Tresa Endo, who determined he was stable for discharge home. He was sent home on 400 mg of Amiodarone BID x 1 week, followed by 400 mg daily. He was also prescribed Eliquis, ASA and Plavix. Internal Medicine prescribed antibiotics, a Prednisone taper and home O2. He will follow up with Dr. Herbie Baltimore in clinic.   Consults: Internal Medicine  Significant Diagnostic Studies:   Emergent LHC  POST-OPERATIVE DIAGNOSIS:  Essentially subtotal, thrombotic occlusion of the at mid RCA with a culprit lesion, treated with a very large 5 mm x 24 mm BMS stent postdilated to 5.6 mm.  Distal embolization of thrombus from the mid vessel into the distal/terminal vessels but no ongoing chest discomfort  Low normal EF with elevated EDP  Otherwise normal coronary angiography besides the proximal RCA moderate lesion.     Treatments: See Hospital Course  Discharge Exam: Blood pressure 123/69, pulse 78, temperature 98.3 F (36.8 C), temperature source Oral, resp. rate 20, height 5\' 10"  (1.778 m), weight 194 lb 10.7 oz (88.3 kg), SpO2 95.00%.   Disposition: Home  Discharge Orders   Future Orders Complete By Expires   Amb Referral to Cardiac Rehabilitation  As directed    Diet - low sodium heart healthy  As directed    Increase activity slowly  As directed        Medication List  STOP taking these medications       theophylline 300 MG 12 hr tablet  Commonly known as:  THEODUR      TAKE these medications       acetaminophen 500 MG tablet  Commonly known as:  TYLENOL  Take by mouth every 6 (six) hours as needed for pain.     albuterol 108 (90 BASE) MCG/ACT inhaler  Commonly  known as:  PROVENTIL HFA;VENTOLIN HFA  Inhale 2 puffs into the lungs every 6 (six) hours as needed for wheezing.     ALPRAZolam 0.5 MG tablet  Commonly known as:  XANAX  Take 1 tablet (0.5 mg total) by mouth 2 (two) times daily as needed for anxiety.     amiodarone 400 MG tablet  Commonly known as:  PACERONE  Take 1 tablet (400 mg total) by mouth 2 (two) times daily.     amiodarone 400 MG tablet  Commonly known as:  PACERONE  Take 1 tablet (400 mg total) by mouth daily.  Start taking on:  11/30/2012     apixaban 5 MG Tabs tablet  Commonly known as:  ELIQUIS  Take 1 tablet (5 mg total) by mouth 2 (two) times daily.     aspirin 81 MG EC tablet  Take 1 tablet (81 mg total) by mouth daily.     atorvastatin 10 MG tablet  Commonly known as:  LIPITOR  Take 1 tablet (10 mg total) by mouth daily at 6 PM.     budesonide-formoterol 160-4.5 MCG/ACT inhaler  Commonly known as:  SYMBICORT  Inhale 2 puffs into the lungs 2 (two) times daily.     clopidogrel 75 MG tablet  Commonly known as:  PLAVIX  Take 1 tablet (75 mg total) by mouth daily.  Start taking on:  11/23/2012     levalbuterol 0.63 MG/3ML nebulizer solution  Commonly known as:  XOPENEX  Take 3 mLs (0.63 mg total) by nebulization 4 (four) times daily.     levofloxacin 750 MG tablet  Commonly known as:  LEVAQUIN  Take 1 tablet (750 mg total) by mouth daily. X 4 DAYS     MUCINEX FAST-MAX DM MAX 5-100 MG/5ML Liqd  Generic drug:  Dextromethorphan-Guaifenesin  Take 15 mLs by mouth every 4 (four) hours as needed (for congestion).     nicotine 14 mg/24hr patch  Commonly known as:  NICODERM CQ - dosed in mg/24 hours  Place 1 patch onto the skin daily.     nitroGLYCERIN 0.4 MG SL tablet  Commonly known as:  NITROSTAT  Place 1 tablet (0.4 mg total) under the tongue every 5 (five) minutes x 3 doses as needed for chest pain.     pantoprazole 40 MG tablet  Commonly known as:  PROTONIX  Take 1 tablet (40 mg total) by mouth daily.      predniSONE 10 MG tablet  Commonly known as:  DELTASONE  Prednisone dosing: Take  Prednisone 40mg  (4 tabs) x 2 days, then taper to 30mg  (3 tabs) x 2 days, then 20mg  (2 tabs) x 2days, then 10mg  (1 tab) x 2days, then OFF.     tiotropium 18 MCG inhalation capsule  Commonly known as:  SPIRIVA  Place 18 mcg into inhaler and inhale daily.           Follow-up Information   Follow up with HAWKINS,EDWARD L, MD. Schedule an appointment as soon as possible for a visit in 2 weeks. (For follow-up of COPD and Pneumonia)    Specialty:  Pulmonary Disease   Contact information:   [address] [place] [address] [phone]      TIME SPENT ON DISCHARGE, INCLUDING PHYSICIAN TIME: >30 MINUTES  Signed: Allayne Butcher, PA-C 11/22/2012, 12:19 PM

## 2012-11-22 NOTE — Progress Notes (Signed)
CRITICAL VALUE ALERT  Critical value received:  Troponin 7.63   Date of notification:  11/22/12  Time of notification:  0555  Critical value read back:yes  Nurse who received alert:  Merry Proud, RN  MD notified (1st page):  Dr. Adolm Joseph  Time of first page:  0625  MD notified (2nd page):  Time of second page:  Responding MD:  Dr. Adolm Joseph  Time MD responded:  [phone] Dr. Adolm Joseph also notified of BNP 2182.  No new orders received.

## 2012-11-22 NOTE — Progress Notes (Signed)
Subjective: No complaints.   Objective: Vital signs in last 24 hours: Temp:  [98.1 F (36.7 C)-98.3 F (36.8 C)] 98.3 F (36.8 C) (10/25 0626) Pulse Rate:  [78-93] 78 (10/25 0626) Resp:  [18-27] 20 (10/25 0626) BP: (111-125)/(69-89) 123/69 mmHg (10/25 0626) SpO2:  [90 %-95 %] 95 % (10/25 0823) FiO2 (%):  [36 %] 36 % (10/25 0823) Last BM Date: 11/21/12  Intake/Output from previous day: 10/24 0701 - 10/25 0700 In: 969 [P.O.:600; I.V.:369] Out: 1650 [Urine:1650] Intake/Output this shift: Total I/O In: 240 [P.O.:240] Out: -   Medications Current Facility-Administered Medications  Medication Dose Route Frequency Provider Last Rate Last Dose  . 0.9 %  sodium chloride infusion  250 mL Intravenous PRN Joline Salt Barrett, PA-C   250 mL at 11/17/12 2210  . acetaminophen (TYLENOL) tablet 650 mg  650 mg Oral Q4H PRN Marykay Lex, MD   650 mg at 11/20/12 2243  . ALPRAZolam Prudy Feeler) tablet 0.5 mg  0.5 mg Oral BID PRN Leeann Must, MD   0.5 mg at 11/21/12 0000  . amiodarone (PACERONE) tablet 400 mg  400 mg Oral BID Chrystie Nose, MD   400 mg at 11/21/12 2138  . antiseptic oral rinse (BIOTENE) solution 15 mL  15 mL Mouth Rinse BID Marykay Lex, MD   15 mL at 11/21/12 2143  . apixaban (ELIQUIS) tablet 5 mg  5 mg Oral BID Hilario Quarry Mercer, RPH   5 mg at 11/21/12 2139  . aspirin EC tablet 81 mg  81 mg Oral Daily Joline Salt Barrett, PA-C   81 mg at 11/21/12 0915  . atorvastatin (LIPITOR) tablet 10 mg  10 mg Oral q1800 Abelino Derrick, PA-C   10 mg at 11/21/12 1709  . budesonide-formoterol (SYMBICORT) 160-4.5 MCG/ACT inhaler 2 puff  2 puff Inhalation BID Calvert Cantor, MD   2 puff at 11/22/12 0820  . guaifenesin (ROBITUSSIN) 100 MG/5ML syrup 400 mg  400 mg Oral TID Marykay Lex, MD   400 mg at 11/21/12 2139  . ipratropium (ATROVENT) nebulizer solution 0.5 mg  0.5 mg Nebulization QID Lonia Blood, MD   0.5 mg at 11/22/12 0819  . levalbuterol (XOPENEX) nebulizer solution 0.63 mg  0.63  mg Nebulization QID Lonia Blood, MD   0.63 mg at 11/22/12 0819  . levofloxacin (LEVAQUIN) tablet 750 mg  750 mg Oral Q24H Dannielle Karvonen [place]ia, RPH   750 mg at 11/21/12 1708  . morphine 2 MG/ML injection 2 mg  2 mg Intravenous Q1H PRN Marykay Lex, MD      . nicotine (NICODERM CQ - dosed in mg/24 hours) patch 14 mg  14 mg Transdermal Daily Marykay Lex, MD   14 mg at 11/21/12 4098  . nitroGLYCERIN (NITROSTAT) SL tablet 0.4 mg  0.4 mg Sublingual Q5 Min x 3 PRN Rhonda G Barrett, PA-C      . ondansetron (ZOFRAN) injection 4 mg  4 mg Intravenous Q6H PRN Rhonda G Barrett, PA-C      . pantoprazole (PROTONIX) EC tablet 40 mg  40 mg Oral Q0600 Ripudeep K Rai, MD      . predniSONE (DELTASONE) tablet 40 mg  40 mg Oral Q breakfast Ripudeep K Rai, MD      . sodium chloride 0.9 % injection 3 mL  3 mL Intravenous Q12H Rhonda G Barrett, PA-C   3 mL at 11/21/12 2143  . sodium chloride 0.9 % injection 3 mL  3 mL Intravenous  PRN Joline Salt Barrett, PA-C      . Ticagrelor (BRILINTA) tablet 90 mg  90 mg Oral BID Wilburt Finlay, PA-C   90 mg at 11/21/12 2138  . zolpidem (AMBIEN) tablet 5 mg  5 mg Oral QHS PRN Joline Salt Barrett, PA-C   5 mg at 11/18/12 0019    PE: General appearance: alert, cooperative and no distress Lungs: decreased breath sounds at the bases Heart: regular rate and rhythm Extremities: no LEE Pulses: 2+ and symmetric Skin: warm and dry Neurologic: Grossly normal  Lab Results:   Recent Labs  11/20/12 0554 11/21/12 0530 11/22/12 0330  WBC 15.4* 13.5* 10.7*  HGB 12.8* 12.5* 12.9*  HCT 39.3 38.8* 39.7  PLT 481* 556* 621*   BMET  Recent Labs  11/20/12 0554 11/22/12 0330  NA 135 136  K 4.3 4.6  CL 98 99  CO2 28 29  GLUCOSE 97 87  BUN 15 15  CREATININE 0.69 0.70  CALCIUM 8.5 8.4   PT/INR  Recent Labs  11/20/12 1418 11/21/12 0530  LABPROT 13.3 14.2  INR 1.03 1.12    Cardiac Panel (last 3 results)  Recent Labs  11/22/12 0330  TROPONINI 7.63*    Assessment/Plan  Principal Problem:   STEMI of inferior wall 11/18/12- RCA BMS Active Problems:   COPD (chronic obstructive pulmonary disease)   Pneumonia- Rt   Smoker   Dyslipidemia- low HDL   Hypotension- post inferior MI   Atrial fibrillation with RVR   Cardiogenic shock  Plan: Maintaining NSR on telemetry. Continue on PO Amiodarone 400 mg BID  for 1 week total, followed by 400 mg daily. AST is WNL at 31. Continue Eliquis for [facility]. Continue ASA + Brilinta for newly placed stent. Troponin downtrending. Continue antibiotics and steroid taper, per IM, for CAP and COPD exacerbation.   Plan for probable discharge today with OP f/u with Dr. Herbie Baltimore.      LOS: 5 days    Benjamin [name] 11/22/2012 9:39 AM       Patient seen and examined. Agree with assessment and plan. I am concerned about bleeding risk with very potent antiplatelet therapy (brilinta 88% IPA), ASA and NOAG eliquis in pt now on amiodarone and recent LFT elevation.  With very large stent recommend change to less potent plavix 75 mg , 81 ASA if on eliquis to reduce potential bleed risk. OK for dc today.   Lennette Bihari, MD, [facility] 11/22/2012 10:18 AM

## 2012-11-22 NOTE — Progress Notes (Signed)
   CARE MANAGEMENT NOTE 11/22/2012  Patient:  Benjamin [name], Benjamin [name]   Account Number:  [id number]  Date Initiated:  11/17/2012  Documentation initiated by:  Junius Creamer  Subjective/Objective Assessment:   adm w mi     Action/Plan:   lives alone, pcp dr Lyman Bishop fusco   Anticipated DC Date:  11/22/2012   Anticipated DC Plan:  HOME/SELF CARE      DC Planning Services  CM consult  Medication Assistance      Choice offered to / List presented to:     DME arranged  [facility]           Status of service:  Completed, signed off Medicare Important Message given?   (If response is "NO", the following Medicare IM given date fields will be blank) Date Medicare IM given:   Date Additional Medicare IM given:    Discharge Disposition:  HOME/SELF CARE  Per UR Regulation:  Reviewed for med. necessity/level of care/duration of stay  If discussed at Long Length of Stay Meetings, dates discussed:    Comments:  11/22/12 15:25 CM spoke to family concerning and called MD to get paper prescriptions for continuous  02 and for portable device (which according to pt and pt family, as long as they have a prescription and order for continuous 02, their insurance will cover it.)  MD decided not to prescribe Brilinta and pt already has Eliquis from previous encounter with CM.  Pt's family has tank of 02 for ride home.  RN states she documented pulmonary trial.  No other CM needs were communicated.  Freddy Jaksch, BSN, [place] [phone].  10/24  [phone] debbie dowell rn,bsn pt for poss dc on 10-25. md charted may need eliquis. gave pt eliquis 30day and copay assist card if needed. has concentrator and 1 port tank. he gets oxygen from [facility]. he has not been using o2 24hrs per day. will need order for o2 at 24hrs/day and oxygen conserving device so he can get smaller tanks for mobility.eliquis and brilinta have 70.00 per month copay but pt has copay assist card for both that may bring down.  10/21 0955 debbie dowell  rn,bsn spoke w pt and da. pt has [employer] ins. gave pt 30day free and copay assist card for brilinta.

## 2012-11-22 NOTE — Progress Notes (Signed)
SATURATION QUALIFICATIONS: (This note is used to comply with regulatory documentation for home oxygen)  Patient Saturations on Room Air at Rest =92%  Patient Saturations on Room Air while Ambulating = 81%  Patient Saturations on 4 Liters of oxygen while Ambulating = 96%  Please briefly explain why patient needs home oxygen: Oxygen dropped to 81% while ambulating.

## 2012-12-05 ENCOUNTER — Ambulatory Visit (INDEPENDENT_AMBULATORY_CARE_PROVIDER_SITE_OTHER): Payer: BC Managed Care – PPO | Admitting: Cardiology

## 2012-12-05 ENCOUNTER — Encounter: Payer: Self-pay | Admitting: Cardiology

## 2012-12-05 VITALS — BP 140/88 | HR 64 | Ht 70.0 in | Wt 194.2 lb

## 2012-12-05 DIAGNOSIS — Z9861 Coronary angioplasty status: Secondary | ICD-10-CM

## 2012-12-05 DIAGNOSIS — Z5181 Encounter for therapeutic drug level monitoring: Secondary | ICD-10-CM

## 2012-12-05 DIAGNOSIS — J189 Pneumonia, unspecified organism: Secondary | ICD-10-CM

## 2012-12-05 DIAGNOSIS — I2119 ST elevation (STEMI) myocardial infarction involving other coronary artery of inferior wall: Secondary | ICD-10-CM

## 2012-12-05 DIAGNOSIS — J4489 Other specified chronic obstructive pulmonary disease: Secondary | ICD-10-CM

## 2012-12-05 DIAGNOSIS — Z79899 Other long term (current) drug therapy: Secondary | ICD-10-CM

## 2012-12-05 DIAGNOSIS — J449 Chronic obstructive pulmonary disease, unspecified: Secondary | ICD-10-CM

## 2012-12-05 DIAGNOSIS — E785 Hyperlipidemia, unspecified: Secondary | ICD-10-CM

## 2012-12-05 DIAGNOSIS — I2589 Other forms of chronic ischemic heart disease: Secondary | ICD-10-CM

## 2012-12-05 DIAGNOSIS — F17200 Nicotine dependence, unspecified, uncomplicated: Secondary | ICD-10-CM

## 2012-12-05 DIAGNOSIS — R570 Cardiogenic shock: Secondary | ICD-10-CM

## 2012-12-05 DIAGNOSIS — I4891 Unspecified atrial fibrillation: Secondary | ICD-10-CM

## 2012-12-05 DIAGNOSIS — I255 Ischemic cardiomyopathy: Secondary | ICD-10-CM

## 2012-12-05 DIAGNOSIS — I2510 Atherosclerotic heart disease of native coronary artery without angina pectoris: Secondary | ICD-10-CM

## 2012-12-05 MED ORDER — LOSARTAN POTASSIUM 25 MG PO TABS: 25.0000 mg | ORAL_TABLET | Freq: Every day | ORAL | Status: DC

## 2012-12-05 NOTE — Patient Instructions (Addendum)
Decrease Amiodarone to 200 mg on 12/14/2012  Stop the Aspirin on 11/30 /14  Start Losartan 25 MG  Take 1 tablet daily   Labs -lipid,CMP in 3 months before appointment.   Your physician wants you to follow-up in 3 month Dr Herbie Baltimore. You will receive a reminder letter in the mail two months in advance. If you don't receive a letter, please call our office to schedule the follow-up appointment.   Schedule for Pulm. Function Test at [facility]

## 2012-12-05 NOTE — Progress Notes (Signed)
PATIENT: Nunzio Banet MRN: [id number]  DOB: 1955/10/27   DOV:12/07/2012 PCP: Cassell Smiles., MD  Clinic Note: Chief Complaint  Patient presents with  . Follow-up    post hosp -PCI, NO CHEST PAIN , ANKLE SWELLING ALITTLE  NOT NEW, HAS NO SMOKED SINCE HOSPITALIZATION -HAS NOT NEEDED IN SMOKING CESSATION   HPI: Jacky Dross is a 57 y.o. male with a PMH below who presents today for his first post hospital followup afte a large Inferior STEMI with RV infarction and cardiogenic shock.  He is a long term smoker who had not seen a doctor in several years. He presented with worsening/persistent left-sided substernal chest pain after intermittent episodes for several days.   He came directly to the cardiac catheterization lab and was found to have total occlusion of an extremely large caliber RCA. After aspiration thrombectomy he had PCI with a large bare-metal stent, but had residual thrombus embolized down stream into the RPDA and PL system. He was continued overnight on Integrilin. While initially hypertensive postoperatively with high LVDP, after initial diuresis she became hypotensive requiring pressors - features consistent with RV infarct.  His EF by echocardiogram was 40-45% with severe hypokinesis to akinesis of the inferoseptal wall and incoordinate septal motion.  He was also treated for a concommitant right-sided pneumonia as part of a COPD exacerbation. His hospital course was also complicated by recurrent atrial fibrillation/flutter with a rapid rate. He initially underwent cardioversion but reverted right back into the rhythm.  He was started on amiodarone infusion, and spontaneously converted. He is now converted to oral amiodarone for complete loading. He has not had any desire to have a cigarette since his discharge, and has not even needed to use the patch.   Interval History:  he presents today feeling "the best he had felt in years." He has been walking every day as directed by  the cardiac rehabilitation therapists in the hospital. He is now up to several times a day walking 6 minutes. He was discharged on home oxygen, but is sometimes able to go without oxygen while sitting in the house or walking in the house. He has not had any further chest tightness or pressure with rest or exertion. In fact his baseline dyspnea on exertion has dramatically improved since his hospitalization. He has no PND, orthopnea with minimal lower extremity/ankle edema. He denies any palpitations or rapid heart beats. No lightheadedness, dizziness or wooziness though be suggestive of near-syncope or syncope. No TIA or amaurosis fugax symptoms. No melena, hematochezia or hematuria. Denies any claudication.  Past Medical History  Diagnosis Date  . Tobacco abuse     40 Pk-yr (1- 1 1/2 PPD) --> Quit 11/18/2012 when presented with STEMI  . COPD (chronic obstructive pulmonary disease)   . STEMI of inferior wall 11/18/12- RCA BMS 11/17/2012  . CAD S/P percutaneous coronary angioplasty  11/18/2012    - PCI mid RCA - 5.0 mm x 24 mm VeriFlex BMS (5.6 mm)  . Atrial fibrillation with RVR - peri-MI; recurrent after DCCV 11/19/2012  . Cardiogenic shock - post MI with RV infarction 11/19/2012  . Dyslipidemia- low HDL 11/19/2012   Prior Cardiac Evaluation and Past Surgical History: Past Surgical History  Procedure Laterality Date  . Dental extractions    . Coronary angioplasty with stent placement Right 11/17/12    Mid RCA aspiration thrombectomy and PCI with VeriFlex BMS 5.0 mm x 24 mm (5.6 mm; also PTCA of distal RPL 4 distal embolization.  . Transthoracic echocardiogram  11/18/2012    Normal LV size, moderate reduced function 40-45% with severe HK-AK of the inferoseptal wall with incoordinate septal motion.   Allergies  Allergen Reactions  . Codeine     Red blotches  . Penicillins     Childhood reaction   Current Outpatient Prescriptions  Medication Sig Dispense Refill  . acetaminophen  (TYLENOL) 500 MG tablet Take by mouth every 6 (six) hours as needed for pain.      [name] albuterol (PROVENTIL HFA;VENTOLIN HFA) 108 (90 BASE) MCG/ACT inhaler Inhale 2 puffs into the lungs every 6 (six) hours as needed for wheezing.      [name] ALPRAZolam (XANAX) 0.5 MG tablet Take 1 tablet (0.5 mg total) by mouth 2 (two) times daily as needed for anxiety.  20 tablet  0  . amiodarone (PACERONE) 400 MG tablet Take 1 tablet (400 mg total) by mouth daily.  30 tablet  5  . apixaban (ELIQUIS) 5 MG TABS tablet Take 1 tablet (5 mg total) by mouth 2 (two) times daily.  60 tablet  11  . aspirin EC 81 MG EC tablet Take 1 tablet (81 mg total) by mouth daily.      [name] atorvastatin (LIPITOR) 10 MG tablet Take 1 tablet (10 mg total) by mouth daily at 6 PM.  30 tablet  5  . budesonide-formoterol (SYMBICORT) 160-4.5 MCG/ACT inhaler Inhale 2 puffs into the lungs 2 (two) times daily.      . clopidogrel (PLAVIX) 75 MG tablet Take 1 tablet (75 mg total) by mouth daily.  30 tablet  11  . Dextromethorphan-Guaifenesin (MUCINEX FAST-MAX DM MAX) 5-100 MG/5ML LIQD Take 15 mLs by mouth every 4 (four) hours as needed (for congestion).      [name] levalbuterol (XOPENEX) 0.63 MG/3ML nebulizer solution Take 3 mLs (0.63 mg total) by nebulization 4 (four) times daily.  3 mL  12  . nitroGLYCERIN (NITROSTAT) 0.4 MG SL tablet Place 1 tablet (0.4 mg total) under the tongue every 5 (five) minutes x 3 doses as needed for chest pain.  25 tablet  2  . OXYGEN-HELIUM IN Inhale into the lungs. Using 4 liters nasal cannula ,continuous      . pantoprazole (PROTONIX) 40 MG tablet Take 1 tablet (40 mg total) by mouth daily.  30 tablet  1  . tiotropium (SPIRIVA) 18 MCG inhalation capsule Place 18 mcg into inhaler and inhale daily.      [name] losartan (COZAAR) 25 MG tablet Take 1 tablet (25 mg total) by mouth daily.  30 tablet  11  . nicotine (NICODERM CQ - DOSED IN MG/24 HOURS) 14 mg/24hr patch Place 1 patch onto the skin daily.  7 patch  0   No current  facility-administered medications for this visit.    History   Social History Narrative   Divorced father of 2 (one daughter one son).   Lives alone, but his daughter who lives nearby is "watching him like a Tax adviser ".   He quit smoking during his hospitalization after a 40-pack-year smoking history.   He is now gradually building up his exercise level walking 6 minutes at a time several times a day. He really a waiting Cardiac Rehabilitation.   Does not drink alcohol.   ROS: A comprehensive Review of Systems - Negative except Occasional morning cough that is chronic. But no fevers, chills or significant wheezing episodes.  PHYSICAL EXAM BP 140/88  Pulse 64  Ht 5\' 10"  (1.778 m)  Wt 194 lb 3.2 oz (88.089 kg)  BMI 27.86 kg/m2 General appearance: alert, cooperative, appears older than stated age, no distress and Compared to his hospitalization he is quite healthy-appearing. Well-nourished and well-groomed. His skin color has returned. he is uusing home oxygen and is in no acute respiratory distress. He walked in comfortably. Neck: no adenopathy, no carotid bruit, no JVD, supple, symmetrical, trachea midline and thyroid not enlarged, symmetric, no tenderness/mass/nodules Lungs: Diffuse infiltrate next coronary rhonchorous wheezing, that is notably improved from hospitalization; no longer wet sounding. The right base is somewhat more dull and diminished. He has increased AP diameter with prolonged expiratory phase with expiratory wheezing. Heart: regular rate and rhythm, S1, S2 normal, no murmur, click, rub or gallop and normal apical impulse Abdomen: soft, non-tender; bowel sounds normal; no masses,  no organomegaly Extremities: extremities normal, atraumatic, no cyanosis or edema Pulses: 2+ and symmetric Neurologic: Grossly normal  NFA:[id number] today: Yes Rate: 64 , Rhythm: NSR, LAD with evolving inferior MI changes. Still mild ST elevations noted but with T-wave inversion and mild Q  waves.  Recent Labs: reviewed from recent hospitalization  ASSESSMENT / PLAN: STEMI of inferior wall 11/18/12- RCA BMS He had a sizable infarct, it then may have actually occurred several days prior to his presentation. His RCA was subtotally occluded but is very large diameter vessel with full thrombus despite my best efforts with atherectomy, a significant amount of thrombus did embolized downstream leading to a large territory of infarction downstream. Quite clearly there is an RV marginal branch is jeopardized as well.  He seems to be recovering quite well. He does have moderately reduced EF, but no active heart failure symptoms. As his blood pressures returning to her normal baseline, we can start titrating up cardiac medications.   Referral for Phase II Cardiac Rehabilitation, hopefully in [place] which be closer to home  He should be fine for driving now, but would recommend not driving long distances yet  CAD S/P percutaneous coronary angioplasty - PCI mid RCA - 5.0 mm x 24 mm VeriFlex BMS (5.6 mm) He is a very large caliber bare-metal stent in his RCA. He was discharged on aspirin plus Plavix along with Eliquis for atrial fibrillation evaluation. With the combination of atrial fibrillation and recent MI, the best plan would be for him to stop aspirin after the first month and continue on the Plavix plus Eliquis. We would continue this way for a year and then convert to Eliquis plus aspirin. He is now on atorvastatin and will need to have followed up. He is not on a beta blocker due to COPD and has some beta blocker component and amiodarone.  Cardiomyopathy, ischemic - EF 40-45% No active heart failure symptoms. Will add ARB today as his blood pressure is now improving. Hold off on beta blocker for now, would potentially consider digoxin plus or minus spironolactone for mild diuresis in the future.  Cardiogenic shock - post MI with RV infarction Thankfully his hypotension has  resolved. Now that his pressures are improving, we can start to titrate up cardiac medications. I think some of his shock may very well be an RV infarct related as well as potentially sepsis from his pneumonia.  Atrial fibrillation with RVR - peri-MI; recurrent after DCCV No further A. fib that I can tell since amiodarone loading. Although amiodarone may not be a good long-term medication for him, I think as long as he is still the recovery phase of his MI will keep him on amiodarone.  Plan: He is currently on 400  mg daily amiodarone, he will continue this until the end of next week (11/16) after which point he'll reduce to 200 mg once daily as maintenance dose.  Check PFTs for baseline  It continued on long-term, he will need annual eye exams  Followup LFTs with lipids in 3 months prior to followup  Continue Eliquis for anticoagulation.  Smoker - 2 weeks since last cigarette He is doing well post discharge but no urges to smoke. I congratulated in this for his efforts, and recommended he not be too proud to use his patches if need be.  Dyslipidemia- low HDL He is currently on atorvastatin 10 mg. We will recheck his lipids in 3 months to see how he is doing in the non-morbid state.  Pneumonia- Rt He was treated for a full course of antibiotics. No residual fevers or chills.  COPD (chronic obstructive pulmonary disease) - Now on Home O2 For now continue home O2. He will need to establish with a primary physician to continue monitoring this. He is doing better with this and the court and 3 the. He is not having to use his rescue inhaler very much at all. He does do 4 times a day nebulizers.    Orders Placed This Encounter  Procedures  . Lipid panel    Order Specific Question:  Has the patient fasted?    Answer:  Yes  . Comprehensive metabolic panel    Order Specific Question:  Has the patient fasted?    Answer:  Yes  . EKG 12-Lead  . Pulmonary function test    DX COPD, AND USE OF  AMIODARONE    Standing Status: Future     Number of Occurrences:      Standing Expiration Date: 12/05/2013    Order Specific Question:  Where should this test be performed?    Answer:  [facility]    Order Specific Question:  Full PFT    Answer:  Yes    Order Specific Question:  Spirometry pre & post bronchodilator    Answer:  No    Order Specific Question:  ABG    Answer:  No    Order Specific Question:  Diffusion capacity (DLCO)    Answer:  Yes    Order Specific Question:  MIP/MEP    Answer:  No    Order Specific Question:  Lung volumes    Answer:  Yes    Order Specific Question:  Methacholine challenge    Answer:  No    Order Specific Question:  Portable before and after    Answer:  No    Order Specific Question:  Portable simple    Answer:  No   Meds ordered this encounter  Medications  . OXYGEN-HELIUM IN    Sig: Inhale into the lungs. Using 4 liters nasal cannula ,continuous  . losartan (COZAAR) 25 MG tablet    Sig: Take 1 tablet (25 mg total) by mouth daily.    Dispense:  30 tablet    Refill:  11    Followup: 3 months   Jujhar Everett W. Herbie Baltimore, M.D., M.S. [facility] [address] [place], [place]  [address]  [phone] Pager # [phone]

## 2012-12-07 ENCOUNTER — Encounter: Payer: Self-pay | Admitting: Cardiology

## 2012-12-07 DIAGNOSIS — I255 Ischemic cardiomyopathy: Secondary | ICD-10-CM | POA: Insufficient documentation

## 2012-12-07 NOTE — Assessment & Plan Note (Signed)
He is a very large caliber bare-metal stent in his RCA. He was discharged on aspirin plus Plavix along with Eliquis for atrial fibrillation evaluation. With the combination of atrial fibrillation and recent MI, the best plan would be for him to stop aspirin after the first month and continue on the Plavix plus Eliquis. We would continue this way for a year and then convert to Eliquis plus aspirin. He is now on atorvastatin and will need to have followed up. He is not on a beta blocker due to COPD and has some beta blocker component and amiodarone.

## 2012-12-07 NOTE — Assessment & Plan Note (Signed)
He is currently on atorvastatin 10 mg. We will recheck his lipids in 3 months to see how he is doing in the non-morbid state.

## 2012-12-07 NOTE — Assessment & Plan Note (Signed)
No active heart failure symptoms. Will add ARB today as his blood pressure is now improving. Hold off on beta blocker for now, would potentially consider digoxin plus or minus spironolactone for mild diuresis in the future.

## 2012-12-07 NOTE — Assessment & Plan Note (Addendum)
He had a sizable infarct, it then may have actually occurred several days prior to his presentation. His RCA was subtotally occluded but is very large diameter vessel with full thrombus despite my best efforts with atherectomy, a significant amount of thrombus did embolized downstream leading to a large territory of infarction downstream. Quite clearly there is an RV marginal branch is jeopardized as well.  He seems to be recovering quite well. He does have moderately reduced EF, but no active heart failure symptoms. As his blood pressures returning to her normal baseline, we can start titrating up cardiac medications.   Referral for Phase II Cardiac Rehabilitation, hopefully in [place] which be closer to home  He should be fine for driving now, but would recommend not driving long distances yet

## 2012-12-07 NOTE — Assessment & Plan Note (Signed)
He was treated for a full course of antibiotics. No residual fevers or chills.

## 2012-12-07 NOTE — Assessment & Plan Note (Signed)
He is doing well post discharge but no urges to smoke. I congratulated in this for his efforts, and recommended he not be too proud to use his patches if need be.

## 2012-12-07 NOTE — Assessment & Plan Note (Signed)
Thankfully his hypotension has resolved. Now that his pressures are improving, we can start to titrate up cardiac medications. I think some of his shock may very well be an RV infarct related as well as potentially sepsis from his pneumonia.

## 2012-12-07 NOTE — Assessment & Plan Note (Signed)
For now continue home O2. He will need to establish with a primary physician to continue monitoring this. He is doing better with this and the court and 3 the. He is not having to use his rescue inhaler very much at all. He does do 4 times a day nebulizers.

## 2012-12-07 NOTE — Assessment & Plan Note (Addendum)
No further A. fib that I can tell since amiodarone loading. Although amiodarone may not be a good long-term medication for him, I think as long as he is still the recovery phase of his MI will keep him on amiodarone.  Plan: He is currently on 400 mg daily amiodarone, he will continue this until the end of next week (11/16) after which point he'll reduce to 200 mg once daily as maintenance dose.  Check PFTs for baseline  It continued on long-term, he will need annual eye exams  Followup LFTs with lipids in 3 months prior to followup  Continue Eliquis for anticoagulation.

## 2012-12-08 ENCOUNTER — Telehealth: Payer: Self-pay | Admitting: Cardiology

## 2012-12-08 ENCOUNTER — Encounter: Payer: Self-pay | Admitting: Cardiology

## 2012-12-08 NOTE — Telephone Encounter (Signed)
sw lisa at [facility] and schedeled pfts for November 25. Patient notified

## 2012-12-23 ENCOUNTER — Ambulatory Visit (HOSPITAL_COMMUNITY)
Admission: RE | Admit: 2012-12-23 | Discharge: 2012-12-23 | Disposition: A | Discharge status: Home / Self Care | Payer: BC Managed Care – PPO | Source: Ambulatory Visit | Attending: Cardiology | Admitting: Cardiology

## 2012-12-23 DIAGNOSIS — J4489 Other specified chronic obstructive pulmonary disease: Secondary | ICD-10-CM | POA: Insufficient documentation

## 2012-12-23 DIAGNOSIS — Z79899 Other long term (current) drug therapy: Secondary | ICD-10-CM

## 2012-12-23 DIAGNOSIS — J449 Chronic obstructive pulmonary disease, unspecified: Secondary | ICD-10-CM

## 2012-12-23 DIAGNOSIS — Z5181 Encounter for therapeutic drug level monitoring: Secondary | ICD-10-CM

## 2012-12-23 LAB — PULMONARY FUNCTION TEST
DL/VA % pred: 47 %
DLCO unc % pred: 30 %
DLCO unc: 10.25 ml/min/mmHg
FEF 25-75 Pre: 0.34 L/sec
FEF2575-%Pred-Post: 10 %
FEV1-%Change-Post: -9 %
FEV1-%Pred-Post: 21 %
FEV1-%Pred-Pre: 23 %
FEV1-Pre: 0.92 L
FEV1FVC-%Change-Post: -6 %
FEV1FVC-%Pred-Pre: 53 %
FEV6-%Pred-Post: 42 %
FEV6-%Pred-Pre: 43 %
FEV6-Post: 2.05 L
FEV6-Pre: 2.08 L
FEV6FVC-%Change-Post: 1 %
FEV6FVC-%Pred-Post: 96 %
FEV6FVC-%Pred-Pre: 95 %
FVC-%Pred-Pre: 44 %
FVC-Post: 2.2 L
Post FEV6/FVC ratio: 93 %
Pre FEV6/FVC Ratio: 92 %
RV % pred: 220 %
TLC % pred: 108 %
TLC: 7.79 L

## 2012-12-23 MED ORDER — ALBUTEROL SULFATE (5 MG/ML) 0.5% IN NEBU
2.5000 mg | INHALATION_SOLUTION | Freq: Once | RESPIRATORY_TRACT | Status: AC
  Administered 2012-12-23: 2.5 mg via RESPIRATORY_TRACT

## 2012-12-24 NOTE — Procedures (Signed)
NAME:  LEX, LINHARES             ACCOUNT NO.:  [id number]  MEDICAL RECORD NO.:  [id number]  LOCATION:  RESP                          FACILITY:  [facility]  PHYSICIAN:  Rhyan Radler L. Juanetta Gosling, M.D.DATE OF BIRTH:  17-Feb-1955  DATE OF PROCEDURE: DATE OF DISCHARGE:  12/23/2012                           PULMONARY FUNCTION TEST   Reason for pulmonary function testing is COPD.  1. Spirometry shows a severe ventilatory defect with evidence of     airflow obstruction. 2. Lung volumes show marked air trapping. 3. DLCO is severely reduced. 4. Airway resistance is elevated confirming the presence of airflow     obstruction. 5. There is no significant bronchodilator improvement. 6. This study is consistent with clinical diagnosis of COPD with is     severe.     Westlyn Glaza L. Juanetta Gosling, M.D.     ELH/MEDQ  D:  12/23/2012  T:  12/24/2012  Job:  [id number]  cc:   DAVID [place]

## 2013-01-01 ENCOUNTER — Encounter (HOSPITAL_COMMUNITY)
Admission: RE | Admit: 2013-01-01 | Discharge: 2013-01-01 | Disposition: A | Discharge status: Home / Self Care | Payer: BC Managed Care – PPO | Source: Ambulatory Visit | Attending: Cardiology | Admitting: Cardiology

## 2013-01-01 VITALS — BP 134/90 | HR 71 | Ht 71.0 in | Wt 206.3 lb

## 2013-01-01 DIAGNOSIS — I214 Non-ST elevation (NSTEMI) myocardial infarction: Secondary | ICD-10-CM

## 2013-01-01 DIAGNOSIS — Z9861 Coronary angioplasty status: Secondary | ICD-10-CM | POA: Insufficient documentation

## 2013-01-01 DIAGNOSIS — I252 Old myocardial infarction: Secondary | ICD-10-CM | POA: Insufficient documentation

## 2013-01-01 DIAGNOSIS — Z5189 Encounter for other specified aftercare: Secondary | ICD-10-CM | POA: Insufficient documentation

## 2013-01-01 NOTE — Patient Instructions (Signed)
Pt has finished orientation and is scheduled to start CR on 01/01/13 at 11 am. Pt has been instructed to arrive to class 15 minutes early for scheduled class. Pt has been instructed to wear comfortable clothing and shoes with rubber soles. Pt has been told to take their medications 1 hour prior to coming to class.  If the patient is not going to attend class, he/she has been instructed to call.

## 2013-01-01 NOTE — Progress Notes (Signed)
Patient was referred to CR by Dr. Herbie Baltimore due to MI and Stent x1 V45.82. During orientation advised patient on arrival and appointment times what to wear, what to do before, during and after exercise. Reviewed attendance and class policy. Talked about inclement weather and class consultation policy. Pt is scheduled to start Cardiac Rehab on 01/05/13 at 11 am. Pt was advised to come to class 5 minutes before class starts. He was also given instructions on meeting with the dietician and attending the Family Structure classes. Pt is eager to get started. Will do the 6 minute walk test with oxygen.

## 2013-01-05 ENCOUNTER — Encounter (HOSPITAL_COMMUNITY)
Admission: RE | Admit: 2013-01-05 | Discharge: 2013-01-05 | Disposition: A | Discharge status: Home / Self Care | Payer: BC Managed Care – PPO | Source: Ambulatory Visit | Attending: Cardiology | Admitting: Cardiology

## 2013-01-07 ENCOUNTER — Encounter (HOSPITAL_COMMUNITY)
Admission: RE | Admit: 2013-01-07 | Discharge: 2013-01-07 | Disposition: A | Discharge status: Home / Self Care | Payer: BC Managed Care – PPO | Source: Ambulatory Visit | Attending: Cardiology | Admitting: Cardiology

## 2013-01-08 ENCOUNTER — Telehealth: Payer: Self-pay | Admitting: *Deleted

## 2013-01-08 NOTE — Telephone Encounter (Signed)
Faxed cardiac rehab program  Phase ll order---signed Dr Herbie Baltimore.

## 2013-01-09 ENCOUNTER — Encounter (HOSPITAL_COMMUNITY)
Admission: RE | Admit: 2013-01-09 | Discharge: 2013-01-09 | Disposition: A | Discharge status: Home / Self Care | Payer: BC Managed Care – PPO | Source: Ambulatory Visit | Attending: Cardiology | Admitting: Cardiology

## 2013-01-12 ENCOUNTER — Encounter (HOSPITAL_COMMUNITY)
Admission: RE | Admit: 2013-01-12 | Discharge: 2013-01-12 | Disposition: A | Discharge status: Home / Self Care | Payer: BC Managed Care – PPO | Source: Ambulatory Visit | Attending: Cardiology | Admitting: Cardiology

## 2013-01-14 ENCOUNTER — Encounter (HOSPITAL_COMMUNITY)
Admission: RE | Admit: 2013-01-14 | Discharge: 2013-01-14 | Disposition: A | Discharge status: Home / Self Care | Payer: BC Managed Care – PPO | Source: Ambulatory Visit | Attending: Cardiology | Admitting: Cardiology

## 2013-01-16 ENCOUNTER — Encounter (HOSPITAL_COMMUNITY)
Admission: RE | Admit: 2013-01-16 | Discharge: 2013-01-16 | Disposition: A | Discharge status: Home / Self Care | Payer: BC Managed Care – PPO | Source: Ambulatory Visit | Attending: Cardiology | Admitting: Cardiology

## 2013-01-19 ENCOUNTER — Encounter (HOSPITAL_COMMUNITY)
Admission: RE | Admit: 2013-01-19 | Discharge: 2013-01-19 | Disposition: A | Discharge status: Home / Self Care | Payer: BC Managed Care – PPO | Source: Ambulatory Visit | Attending: Cardiology | Admitting: Cardiology

## 2013-01-21 ENCOUNTER — Encounter (HOSPITAL_COMMUNITY): Payer: BC Managed Care – PPO

## 2013-01-23 ENCOUNTER — Encounter (HOSPITAL_COMMUNITY)
Admission: RE | Admit: 2013-01-23 | Discharge: 2013-01-23 | Disposition: A | Discharge status: Home / Self Care | Payer: BC Managed Care – PPO | Source: Ambulatory Visit | Attending: Cardiology | Admitting: Cardiology

## 2013-01-26 ENCOUNTER — Encounter (HOSPITAL_COMMUNITY)
Admission: RE | Admit: 2013-01-26 | Discharge: 2013-01-26 | Disposition: A | Discharge status: Home / Self Care | Payer: BC Managed Care – PPO | Source: Ambulatory Visit | Attending: Cardiology | Admitting: Cardiology

## 2013-01-28 ENCOUNTER — Encounter (HOSPITAL_COMMUNITY)
Admission: RE | Admit: 2013-01-28 | Discharge: 2013-01-28 | Disposition: A | Discharge status: Home / Self Care | Payer: BC Managed Care – PPO | Source: Ambulatory Visit | Attending: Cardiology | Admitting: Cardiology

## 2013-01-30 ENCOUNTER — Encounter (HOSPITAL_COMMUNITY)
Admission: RE | Admit: 2013-01-30 | Discharge: 2013-01-30 | Disposition: A | Discharge status: Home / Self Care | Payer: BC Managed Care – PPO | Source: Ambulatory Visit | Attending: Cardiology | Admitting: Cardiology

## 2013-01-30 DIAGNOSIS — Z5189 Encounter for other specified aftercare: Secondary | ICD-10-CM | POA: Insufficient documentation

## 2013-01-30 DIAGNOSIS — Z9861 Coronary angioplasty status: Secondary | ICD-10-CM | POA: Insufficient documentation

## 2013-01-30 DIAGNOSIS — I252 Old myocardial infarction: Secondary | ICD-10-CM | POA: Insufficient documentation

## 2013-02-02 ENCOUNTER — Encounter (HOSPITAL_COMMUNITY)
Admission: RE | Admit: 2013-02-02 | Discharge: 2013-02-02 | Disposition: A | Discharge status: Home / Self Care | Payer: BC Managed Care – PPO | Source: Ambulatory Visit | Attending: Cardiology | Admitting: Cardiology

## 2013-02-04 ENCOUNTER — Encounter (HOSPITAL_COMMUNITY)
Admission: RE | Admit: 2013-02-04 | Discharge: 2013-02-04 | Disposition: A | Discharge status: Home / Self Care | Payer: BC Managed Care – PPO | Source: Ambulatory Visit | Attending: Cardiology | Admitting: Cardiology

## 2013-02-06 ENCOUNTER — Encounter (HOSPITAL_COMMUNITY)
Admission: RE | Admit: 2013-02-06 | Discharge: 2013-02-06 | Disposition: A | Discharge status: Home / Self Care | Payer: BC Managed Care – PPO | Source: Ambulatory Visit | Attending: Cardiology | Admitting: Cardiology

## 2013-02-09 ENCOUNTER — Encounter (HOSPITAL_COMMUNITY): Payer: BC Managed Care – PPO

## 2013-02-11 ENCOUNTER — Encounter (HOSPITAL_COMMUNITY): Payer: BC Managed Care – PPO

## 2013-02-13 ENCOUNTER — Encounter (HOSPITAL_COMMUNITY)
Admission: RE | Admit: 2013-02-13 | Discharge: 2013-02-13 | Disposition: A | Discharge status: Home / Self Care | Payer: BC Managed Care – PPO | Source: Ambulatory Visit | Attending: Cardiology | Admitting: Cardiology

## 2013-02-16 ENCOUNTER — Encounter (HOSPITAL_COMMUNITY)
Admission: RE | Admit: 2013-02-16 | Discharge: 2013-02-16 | Disposition: A | Discharge status: Home / Self Care | Payer: BC Managed Care – PPO | Source: Ambulatory Visit | Attending: Cardiology | Admitting: Cardiology

## 2013-02-18 ENCOUNTER — Encounter (HOSPITAL_COMMUNITY)
Admission: RE | Admit: 2013-02-18 | Discharge: 2013-02-18 | Disposition: A | Discharge status: Home / Self Care | Payer: BC Managed Care – PPO | Source: Ambulatory Visit | Attending: Cardiology | Admitting: Cardiology

## 2013-02-20 ENCOUNTER — Encounter (HOSPITAL_COMMUNITY)
Admission: RE | Admit: 2013-02-20 | Discharge: 2013-02-20 | Disposition: A | Discharge status: Home / Self Care | Payer: BC Managed Care – PPO | Source: Ambulatory Visit | Attending: Cardiology | Admitting: Cardiology

## 2013-02-23 ENCOUNTER — Encounter (HOSPITAL_COMMUNITY)
Admission: RE | Admit: 2013-02-23 | Discharge: 2013-02-23 | Disposition: A | Discharge status: Home / Self Care | Payer: BC Managed Care – PPO | Source: Ambulatory Visit | Attending: Cardiology | Admitting: Cardiology

## 2013-02-25 ENCOUNTER — Encounter (HOSPITAL_COMMUNITY)
Admission: RE | Admit: 2013-02-25 | Discharge: 2013-02-25 | Disposition: A | Discharge status: Home / Self Care | Payer: BC Managed Care – PPO | Source: Ambulatory Visit | Attending: Cardiology | Admitting: Cardiology

## 2013-02-27 ENCOUNTER — Encounter (HOSPITAL_COMMUNITY)
Admission: RE | Admit: 2013-02-27 | Discharge: 2013-02-27 | Disposition: A | Discharge status: Home / Self Care | Payer: BC Managed Care – PPO | Source: Ambulatory Visit | Attending: Cardiology | Admitting: Cardiology

## 2013-03-02 ENCOUNTER — Encounter (HOSPITAL_COMMUNITY)
Admission: RE | Admit: 2013-03-02 | Discharge: 2013-03-02 | Disposition: A | Discharge status: Home / Self Care | Payer: BC Managed Care – PPO | Source: Ambulatory Visit | Attending: Cardiology | Admitting: Cardiology

## 2013-03-02 DIAGNOSIS — Z5189 Encounter for other specified aftercare: Secondary | ICD-10-CM | POA: Insufficient documentation

## 2013-03-02 DIAGNOSIS — I252 Old myocardial infarction: Secondary | ICD-10-CM | POA: Insufficient documentation

## 2013-03-02 DIAGNOSIS — Z9861 Coronary angioplasty status: Secondary | ICD-10-CM | POA: Insufficient documentation

## 2013-03-04 ENCOUNTER — Encounter (HOSPITAL_COMMUNITY)
Admission: RE | Admit: 2013-03-04 | Discharge: 2013-03-04 | Disposition: A | Discharge status: Home / Self Care | Payer: BC Managed Care – PPO | Source: Ambulatory Visit | Attending: Cardiology | Admitting: Cardiology

## 2013-03-06 ENCOUNTER — Encounter (HOSPITAL_COMMUNITY): Payer: BC Managed Care – PPO

## 2013-03-09 ENCOUNTER — Encounter (HOSPITAL_COMMUNITY): Payer: BC Managed Care – PPO

## 2013-03-09 LAB — COMPREHENSIVE METABOLIC PANEL
ALT: 16 U/L (ref 0–53)
AST: 15 U/L (ref 0–37)
Albumin: 4.2 g/dL (ref 3.5–5.2)
Alkaline Phosphatase: 63 U/L (ref 39–117)
BUN: 16 mg/dL (ref 6–23)
CO2: 31 mEq/L (ref 19–32)
Calcium: 9.2 mg/dL (ref 8.4–10.5)
Chloride: 101 mEq/L (ref 96–112)
Creat: 0.92 mg/dL (ref 0.50–1.35)
Glucose, Bld: 91 mg/dL (ref 70–99)
Potassium: 5 mEq/L (ref 3.5–5.3)
Sodium: 138 mEq/L (ref 135–145)
Total Bilirubin: 0.4 mg/dL (ref 0.2–1.2)
Total Protein: 6.9 g/dL (ref 6.0–8.3)

## 2013-03-09 LAB — LIPID PANEL
Cholesterol: 204 mg/dL — ABNORMAL HIGH (ref 0–200)
HDL: 66 mg/dL (ref >39)
LDL Cholesterol: 115 mg/dL — ABNORMAL HIGH (ref 0–99)
Total CHOL/HDL Ratio: 3.1 Ratio
Triglycerides: 114 mg/dL (ref <150)
VLDL: 23 mg/dL (ref 0–40)

## 2013-03-11 ENCOUNTER — Encounter (HOSPITAL_COMMUNITY): Payer: BC Managed Care – PPO

## 2013-03-13 ENCOUNTER — Telehealth: Payer: Self-pay | Admitting: *Deleted

## 2013-03-13 ENCOUNTER — Encounter (HOSPITAL_COMMUNITY): Payer: BC Managed Care – PPO

## 2013-03-13 DIAGNOSIS — E782 Mixed hyperlipidemia: Secondary | ICD-10-CM

## 2013-03-13 DIAGNOSIS — Z79899 Other long term (current) drug therapy: Secondary | ICD-10-CM

## 2013-03-13 NOTE — Telephone Encounter (Signed)
Message copied by Raiford Simmonds on Fri Mar 13, 2013  2:15 PM ------      Message from: Leonie Man      Created: Mon Mar 09, 2013  9:23 PM       As expected - the true Cholesterol level comes out on the [facility] f/u --  TotalCholesterol & LDL are too high.  For now, will continue atorvastatin.  Needs to adjust diet & exercise.  Will reassess in ~3 moths, at that time if no change, will need to be more aggressive.            Leonie Man, MD       ------

## 2013-03-13 NOTE — Telephone Encounter (Signed)
Spoke to patient. Result given . Verbalized understanding. Appointment for 03/17/13 Labs order for 05/2013-[facility] ,lipid

## 2013-03-16 ENCOUNTER — Encounter (HOSPITAL_COMMUNITY): Payer: BC Managed Care – PPO

## 2013-03-17 ENCOUNTER — Ambulatory Visit: Payer: BC Managed Care – PPO | Admitting: Cardiology

## 2013-03-18 ENCOUNTER — Encounter (HOSPITAL_COMMUNITY): Payer: BC Managed Care – PPO

## 2013-03-20 ENCOUNTER — Encounter (HOSPITAL_COMMUNITY): Payer: BC Managed Care – PPO

## 2013-03-20 NOTE — Addendum Note (Signed)
Encounter addended by: Norlene Duel, RN on: 03/20/2013  1:09 PM<BR>     Documentation filed: Clinical Notes

## 2013-03-20 NOTE — Addendum Note (Signed)
Encounter addended by: Norlene Duel, RN on: 03/20/2013  1:08 PM<BR>     Documentation filed: Notes Section

## 2013-03-20 NOTE — Addendum Note (Signed)
Encounter addended by: Norlene Duel, RN on: 03/20/2013  1:31 PM<BR>     Documentation filed: Clinical Notes

## 2013-03-20 NOTE — Addendum Note (Signed)
Encounter addended by: Norlene Duel, RN on: 03/20/2013  1:30 PM<BR>     Documentation filed: Notes Section

## 2013-03-20 NOTE — Progress Notes (Signed)
Cardiac Rehabilitation Program Outcomes Report   Orientation:  01/01/2013 Halfway report: 02/21/2012 Graduate Date:  tbd Discharge Date:  tbd # of sessions completed: 18  DX: CAD Stent X 1 and MI  Cardiologist: Benita Stabile MD:  Haynes Kelis Plasse Time:  11:00  A.  Exercise Program:  Tolerates exercise @ 3.85 METS for 15 minutes  B.  Mental Health:  Good mental attitude  C.  Education/Instruction/Skills  Accurately checks own pulse.  Rest:  67  Exercise: 107, Knows THR for exercise and Uses Perceived Exertion Scale and/or Dyspnea Scale  Uses Perceived Exertion Scale and/or Dyspnea Scale  D.  Nutrition/Weight Control/Body Composition:  Adherence to prescribed nutrition program: good    E.  Blood Lipids    Lab Results  Component Value Date   CHOL 204* 03/09/2013   HDL 66 03/09/2013   LDLCALC 115* 03/09/2013   TRIG 114 03/09/2013   CHOLHDL 3.1 03/09/2013    F.  Lifestyle Changes:  Making positive lifestyle changes and Not smoking:  Quit 2014  G.  Symptoms noted with exercise:  Asymptomatic  Report Completed By:  Oletta Lamas. Benjamin Macpherson RN   Comments:  This is patients halfway report. He achieved a peak METS of 3.85. His resting HR was 67 and BP was 144/90. His peak HR was 107 and BP was 174/110. A graduation report will follow upon patients 36th visit.

## 2013-03-20 NOTE — Progress Notes (Signed)
Cardiac Rehabilitation Program Outcomes Report   Orientation:  01/01/2013 1st week Report: 01/09/2013 Graduate Date:  tbd Discharge Date:  tdb # of sessions completed: 3 DX: CAD and Stent X 1 and MI  Cardiologist: Harding Family MD:  [place] Class Time:  11:00  A.  Exercise Program:  Tolerates exercise @ 3.83 METS for 15 minutes and Walk Test Results:  Pre: Pre: Walk Test: Rest HR 71, BP 134/90, O2 98%, RPE 7 and RPD 10, 6 minuteHR 91, BP 160/110, O2 83%, RPE 13 and RPD 13, post HR 68, BP 130/78,O2 100%, RPE 7 and RPD 10, Walked 1000 feet 1.89 mph at 2.45 METS.   B.  Mental Health:  Good mental attitude  C.  Education/Instruction/Skills  Accurately checks own pulse.  Rest:  77  Exercise:  111, Knows THR for exercise and Uses Perceived Exertion Scale and/or Dyspnea Scale  Uses Perceived Exertion Scale and/or Dyspnea Scale  D.  Nutrition/Weight Control/Body Composition:  Adherence to prescribed nutrition program: good    E.  Blood Lipids    Lab Results  Component Value Date   CHOL 204* 03/09/2013   HDL 66 03/09/2013   LDLCALC 115* 03/09/2013   TRIG 114 03/09/2013   CHOLHDL 3.1 03/09/2013    F.  Lifestyle Changes:  Making positive lifestyle changes and Not smoking:  Quit 2014  G.  Symptoms noted with exercise:  Asymptomatic  Report Completed By:  Nancy L. Bast RN   Comments:  THis is patients 1st week report. HE has done well while in rehab. He achieved a peak MET 3.83. His resting HR was 77 and resting BP was 120/70 and His peak HR was 111 and peak BP was 130/60. A halfway report will follow upon the patients 18th visit.      

## 2013-03-23 ENCOUNTER — Encounter (HOSPITAL_COMMUNITY)
Admission: RE | Admit: 2013-03-23 | Discharge: 2013-03-23 | Disposition: A | Discharge status: Home / Self Care | Payer: BC Managed Care – PPO | Source: Ambulatory Visit | Attending: Cardiology | Admitting: Cardiology

## 2013-03-25 ENCOUNTER — Encounter (HOSPITAL_COMMUNITY)
Admission: RE | Admit: 2013-03-25 | Discharge: 2013-03-25 | Disposition: A | Discharge status: Home / Self Care | Payer: BC Managed Care – PPO | Source: Ambulatory Visit | Attending: Cardiology | Admitting: Cardiology

## 2013-03-26 ENCOUNTER — Ambulatory Visit: Payer: BC Managed Care – PPO | Admitting: Physician Assistant

## 2013-03-27 ENCOUNTER — Encounter (HOSPITAL_COMMUNITY)
Admission: RE | Admit: 2013-03-27 | Discharge: 2013-03-27 | Disposition: A | Discharge status: Home / Self Care | Payer: BC Managed Care – PPO | Source: Ambulatory Visit | Attending: Cardiology | Admitting: Cardiology

## 2013-03-29 HISTORY — PX: TRANSTHORACIC ECHOCARDIOGRAM: SHX275

## 2013-03-30 ENCOUNTER — Encounter (HOSPITAL_COMMUNITY): Payer: BC Managed Care – PPO

## 2013-03-30 ENCOUNTER — Ambulatory Visit (INDEPENDENT_AMBULATORY_CARE_PROVIDER_SITE_OTHER): Payer: BC Managed Care – PPO | Admitting: Physician Assistant

## 2013-03-30 ENCOUNTER — Encounter: Payer: Self-pay | Admitting: Physician Assistant

## 2013-03-30 VITALS — BP 140/80 | HR 77 | Ht 71.0 in | Wt 213.0 lb

## 2013-03-30 DIAGNOSIS — R0989 Other specified symptoms and signs involving the circulatory and respiratory systems: Secondary | ICD-10-CM

## 2013-03-30 DIAGNOSIS — J449 Chronic obstructive pulmonary disease, unspecified: Secondary | ICD-10-CM

## 2013-03-30 DIAGNOSIS — R0609 Other forms of dyspnea: Secondary | ICD-10-CM

## 2013-03-30 DIAGNOSIS — I4891 Unspecified atrial fibrillation: Secondary | ICD-10-CM

## 2013-03-30 DIAGNOSIS — R5381 Other malaise: Secondary | ICD-10-CM

## 2013-03-30 DIAGNOSIS — I5023 Acute on chronic systolic (congestive) heart failure: Secondary | ICD-10-CM

## 2013-03-30 DIAGNOSIS — R0600 Dyspnea, unspecified: Secondary | ICD-10-CM

## 2013-03-30 DIAGNOSIS — I2510 Atherosclerotic heart disease of native coronary artery without angina pectoris: Secondary | ICD-10-CM

## 2013-03-30 DIAGNOSIS — R5383 Other fatigue: Secondary | ICD-10-CM

## 2013-03-30 MED ORDER — AMIODARONE HCL 200 MG PO TABS: 200.0000 mg | ORAL_TABLET | Freq: Every day | ORAL | Status: DC

## 2013-03-30 MED ORDER — FUROSEMIDE 40 MG PO TABS: 40.0000 mg | ORAL_TABLET | Freq: Every day | ORAL | Status: DC

## 2013-03-30 MED ORDER — POTASSIUM CHLORIDE ER 10 MEQ PO TBCR
10.0000 meq | EXTENDED_RELEASE_TABLET | Freq: Every day | ORAL | Status: DC
Start: 2013-03-30 — End: 2013-09-14

## 2013-03-30 NOTE — Assessment & Plan Note (Signed)
Maintaining normal sinus rhythm with a rate of 77 beats per minute.

## 2013-03-30 NOTE — Assessment & Plan Note (Signed)
Patient was started on 40 mg of Lasix daily with K+ 13meq. Will check a BNP today. Patient will check his weight daily when he gets to a 15 pound weight loss take the lasix PRN.  Follow up in two weeks with a BMET  Prior to the visit.

## 2013-03-30 NOTE — Assessment & Plan Note (Signed)
On chronic oxygen supplementation

## 2013-03-30 NOTE — Progress Notes (Signed)
Date:  03/30/2013   ID:  Benjamin [name], DOB 1955-09-19, MRN [id number]  PCP:  [name], MD  Primary Cardiologist:  Ellyn Hack    History of Present Illness: Benjamin [name] is a 58 y.o. male with a PMH below recent history of a large Inferior STEMI with RV infarction and cardiogenic shock. He is a long term smoker who had not seen a doctor in several years.  He came directly to the cardiac catheterization lab and was found to have total occlusion of an extremely large caliber RCA. After aspiration thrombectomy he had PCI with a large bare-metal stent, but had residual thrombus embolized down stream into the RPDA and PL system. He was continued overnight on Integrilin. While initially hypertensive postoperatively with high LVDP, after initial diuresis she became hypotensive requiring pressors - features consistent with RV infarct. His EF by echocardiogram was 40-45% with severe hypokinesis to akinesis of the inferoseptal wall and incoordinate septal motion.  He was also treated for a concommitant right-sided pneumonia as part of a COPD exacerbation. His hospital course was also complicated by recurrent atrial fibrillation/flutter with a rapid rate. He initially underwent cardioversion but reverted right back into the rhythm. He was started on amiodarone infusion, and spontaneously converted.   Patient presents today with complaints of increased dyspnea on exertion. He does present on chronic O2. He's noticed a 19 pound increase in weight. Off-and-on PND, orthopnea and some LEE  The patient currently denies nausea, vomiting, fever, dizziness, cough, congestion, abdominal pain, hematochezia, melena.  Wt Readings from Last 3 Encounters:  03/30/13 213 lb (96.616 kg)  01/01/13 206 lb 4.8 oz (93.577 kg)  12/05/12 194 lb 3.2 oz (88.089 kg)     Past Medical History  Diagnosis Date  . Tobacco abuse     40 Pk-yr (1- 1 1/2 PPD) --> Quit 11/18/2012 when presented with STEMI  . COPD (chronic  obstructive pulmonary disease)   . STEMI of inferior wall 11/18/12- RCA BMS 11/17/2012  . CAD S/P percutaneous coronary angioplasty  11/18/2012    - PCI mid RCA - 5.0 mm x 24 mm VeriFlex BMS (5.6 mm)  . Atrial fibrillation with RVR - peri-MI; recurrent after DCCV 11/19/2012  . Cardiogenic shock - post MI with RV infarction 11/19/2012  . Dyslipidemia- low HDL 11/19/2012    Current Outpatient Prescriptions  Medication Sig Dispense Refill  . acetaminophen (TYLENOL) 500 MG tablet Take by mouth every 6 (six) hours as needed for pain.      [name] albuterol (PROVENTIL HFA;VENTOLIN HFA) 108 (90 BASE) MCG/ACT inhaler Inhale 2 puffs into the lungs every 6 (six) hours as needed for wheezing.      [name] amiodarone (PACERONE) 200 MG tablet Take 1 tablet (200 mg total) by mouth daily.  90 tablet  3  . apixaban (ELIQUIS) 5 MG TABS tablet Take 1 tablet (5 mg total) by mouth 2 (two) times daily.  60 tablet  11  . atorvastatin (LIPITOR) 10 MG tablet Take 1 tablet (10 mg total) by mouth daily at 6 PM.  30 tablet  5  . budesonide-formoterol (SYMBICORT) 160-4.5 MCG/ACT inhaler Inhale 2 puffs into the lungs 2 (two) times daily.      . clopidogrel (PLAVIX) 75 MG tablet Take 1 tablet (75 mg total) by mouth daily.  30 tablet  11  . Dextromethorphan-Guaifenesin (MUCINEX FAST-MAX DM MAX) 5-100 MG/5ML LIQD Take 15 mLs by mouth every 4 (four) hours as needed (for congestion).      [name] levalbuterol (XOPENEX)  0.63 MG/3ML nebulizer solution Take 3 mLs (0.63 mg total) by nebulization 4 (four) times daily.  3 mL  12  . losartan (COZAAR) 25 MG tablet Take 1 tablet (25 mg total) by mouth daily.  30 tablet  11  . nicotine (NICODERM CQ - DOSED IN MG/24 HOURS) 14 mg/24hr patch Place 1 patch onto the skin daily.  7 patch  0  . nitroGLYCERIN (NITROSTAT) 0.4 MG SL tablet Place 1 tablet (0.4 mg total) under the tongue every 5 (five) minutes x 3 doses as needed for chest pain.  25 tablet  2  . OXYGEN-HELIUM IN Inhale into the lungs. Using 4 liters  nasal cannula ,continuous      . pantoprazole (PROTONIX) 40 MG tablet Take 1 tablet (40 mg total) by mouth daily.  30 tablet  1  . tiotropium (SPIRIVA) 18 MCG inhalation capsule Place 18 mcg into inhaler and inhale daily.      . furosemide (LASIX) 40 MG tablet Take 1 tablet (40 mg total) by mouth daily.  30 tablet  3  . potassium chloride (K-DUR) 10 MEQ tablet Take 1 tablet (10 mEq total) by mouth daily.  30 tablet  5   No current facility-administered medications for this visit.    Allergies:    Allergies  Allergen Reactions  . Codeine     Red blotches  . Penicillins     Childhood reaction    Social History:  The patient  reports that he has quit smoking. He has never used smokeless tobacco. He reports that he does not drink alcohol or use illicit drugs.   Family history:   Family History  Problem Relation Age of Onset  . Heart attack Mother   . Heart attack Father   . Cancer Sister     ROS:  Please see the history of present illness.  All other systems reviewed and negative.   PHYSICAL EXAM: VS:  BP 140/80  Pulse 77  Ht 5\' 11"  (1.803 m)  Wt 213 lb (96.616 kg)  BMI 29.72 kg/m2 Well nourished, well developed, in no acute distress HEENT: Pupils are equal round react to light accommodation extraocular movements are intact.  Neck: no JVDNo cervical lymphadenopathy. Cardiac: Regular rate and rhythm without murmurs rubs or gallops. Lungs:  Decrease BS bilaterally, no wheezing, rhonchi or rales. Abd: Distended, tense.  Nontender.  +BS Ext: Trace lower extremity edema.  2+ radial and dorsalis pedis pulses. Skin: warm and dry Neuro:  Grossly normal  EKG:  Normal sinus rhythm rate 77 beats per minute   ASSESSMENT AND PLAN:  Problem List Items Addressed This Visit   COPD (chronic obstructive pulmonary disease) - Now on Home O2 (Chronic)     On chronic oxygen supplementation    Atrial fibrillation with RVR - peri-MI; recurrent after DCCV (Chronic)     Maintaining normal  sinus rhythm with a rate of 77 beats per minute.    Relevant Medications      amiodarone (PACERONE) tablet      furosemide (LASIX) tablet   Acute on chronic systolic heart failure     Patient was started on 40 mg of Lasix daily with K+ 38meq. Will check a BNP today. Patient will check his weight daily when he gets to a 15 pound weight loss take the lasix PRN.  Follow up in two weeks with a BMET  Prior to the visit.     Other Visit Diagnoses   CAD (coronary artery disease)    -  Primary    Relevant Orders       EKG 12-Lead       Basic Metabolic Panel (BMET)    Dyspnea        Relevant Orders       B Nat Peptide       Basic Metabolic Panel (BMET)       2D Echocardiogram without contrast    Other malaise and fatigue        Relevant Orders       Basic Metabolic Panel (BMET)

## 2013-03-30 NOTE — Patient Instructions (Signed)
1. start taking Lasix 40 mg daily. Chart your weight every morning and watch first a decrease. When you get to a 15 pound weight loss, stopped taking the Lasix and only take if you've gained 2 pounds in 24 hours or 5 pounds in a week. 2.  we'll check labs today and prior to next visit 3.  follow up in 2 weeks

## 2013-03-31 LAB — BRAIN NATRIURETIC PEPTIDE: BRAIN NATRIURETIC PEPTIDE: 46.2 pg/mL (ref 0.0–100.0)

## 2013-04-01 ENCOUNTER — Encounter (HOSPITAL_COMMUNITY)
Admission: RE | Admit: 2013-04-01 | Discharge: 2013-04-01 | Disposition: A | Discharge status: Home / Self Care | Payer: BC Managed Care – PPO | Source: Ambulatory Visit | Attending: Cardiology | Admitting: Cardiology

## 2013-04-01 DIAGNOSIS — I252 Old myocardial infarction: Secondary | ICD-10-CM | POA: Insufficient documentation

## 2013-04-01 DIAGNOSIS — Z9861 Coronary angioplasty status: Secondary | ICD-10-CM | POA: Insufficient documentation

## 2013-04-01 DIAGNOSIS — Z5189 Encounter for other specified aftercare: Secondary | ICD-10-CM | POA: Insufficient documentation

## 2013-04-03 ENCOUNTER — Encounter (HOSPITAL_COMMUNITY)
Admission: RE | Admit: 2013-04-03 | Discharge: 2013-04-03 | Disposition: A | Discharge status: Home / Self Care | Payer: BC Managed Care – PPO | Source: Ambulatory Visit | Attending: Cardiology | Admitting: Cardiology

## 2013-04-06 ENCOUNTER — Encounter (HOSPITAL_COMMUNITY)
Admission: RE | Admit: 2013-04-06 | Discharge: 2013-04-06 | Disposition: A | Discharge status: Home / Self Care | Payer: BC Managed Care – PPO | Source: Ambulatory Visit | Attending: Cardiology | Admitting: Cardiology

## 2013-04-08 ENCOUNTER — Encounter (HOSPITAL_COMMUNITY)
Admission: RE | Admit: 2013-04-08 | Discharge: 2013-04-08 | Disposition: A | Discharge status: Home / Self Care | Payer: BC Managed Care – PPO | Source: Ambulatory Visit | Attending: Cardiology | Admitting: Cardiology

## 2013-04-10 ENCOUNTER — Encounter (HOSPITAL_COMMUNITY)
Admission: RE | Admit: 2013-04-10 | Discharge: 2013-04-10 | Disposition: A | Discharge status: Home / Self Care | Payer: BC Managed Care – PPO | Source: Ambulatory Visit | Attending: Cardiology | Admitting: Cardiology

## 2013-04-10 LAB — BASIC METABOLIC PANEL
BUN: 17 mg/dL (ref 6–23)
CALCIUM: 9.2 mg/dL (ref 8.4–10.5)
CO2: 34 mEq/L — ABNORMAL HIGH (ref 19–32)
CREATININE: 0.93 mg/dL (ref 0.50–1.35)
Chloride: 96 mEq/L (ref 96–112)
GLUCOSE: 92 mg/dL (ref 70–99)
Potassium: 4.5 mEq/L (ref 3.5–5.3)
SODIUM: 142 meq/L (ref 135–145)

## 2013-04-13 ENCOUNTER — Encounter (HOSPITAL_COMMUNITY): Payer: BC Managed Care – PPO

## 2013-04-13 ENCOUNTER — Telehealth: Payer: Self-pay | Admitting: *Deleted

## 2013-04-13 ENCOUNTER — Ambulatory Visit (HOSPITAL_COMMUNITY)
Admission: RE | Admit: 2013-04-13 | Discharge: 2013-04-13 | Disposition: A | Discharge status: Home / Self Care | Payer: BC Managed Care – PPO | Source: Ambulatory Visit | Attending: Cardiovascular Disease | Admitting: Cardiovascular Disease

## 2013-04-13 DIAGNOSIS — I519 Heart disease, unspecified: Secondary | ICD-10-CM

## 2013-04-13 DIAGNOSIS — R0609 Other forms of dyspnea: Secondary | ICD-10-CM | POA: Insufficient documentation

## 2013-04-13 DIAGNOSIS — R0600 Dyspnea, unspecified: Secondary | ICD-10-CM

## 2013-04-13 DIAGNOSIS — R0989 Other specified symptoms and signs involving the circulatory and respiratory systems: Principal | ICD-10-CM | POA: Insufficient documentation

## 2013-04-13 NOTE — Progress Notes (Signed)
2D Echo Performed 04/13/2013    Serafina Topham, RCS  

## 2013-04-13 NOTE — Telephone Encounter (Signed)
Message copied by Raiford Simmonds on Mon Apr 13, 2013 10:57 AM ------      Message from: Leonie Man      Created: Sat Apr 11, 2013  9:55 PM       Chemistries look pretty good, but CO2 being elevated would suggest that he is a be "dried out" - back off of lasix for ~2 days.            Leonie Man, MD       ------

## 2013-04-13 NOTE — Telephone Encounter (Signed)
Spoke to patient.BMP Result given . Verbalized understanding Patient states he will see Extender on Wed. of this week. He states he took his lasix already for the day . RN instructed him not to take it tomorrow or Wed. Keep appointment with extender.

## 2013-04-15 ENCOUNTER — Encounter (HOSPITAL_COMMUNITY): Payer: BC Managed Care – PPO

## 2013-04-15 ENCOUNTER — Telehealth: Payer: Self-pay | Admitting: *Deleted

## 2013-04-15 ENCOUNTER — Encounter: Payer: Self-pay | Admitting: Physician Assistant

## 2013-04-15 ENCOUNTER — Ambulatory Visit (INDEPENDENT_AMBULATORY_CARE_PROVIDER_SITE_OTHER): Payer: BC Managed Care – PPO | Admitting: Physician Assistant

## 2013-04-15 VITALS — BP 140/90 | HR 69 | Ht 71.0 in | Wt 212.1 lb

## 2013-04-15 DIAGNOSIS — I255 Ischemic cardiomyopathy: Secondary | ICD-10-CM

## 2013-04-15 DIAGNOSIS — I5023 Acute on chronic systolic (congestive) heart failure: Secondary | ICD-10-CM

## 2013-04-15 DIAGNOSIS — I2589 Other forms of chronic ischemic heart disease: Secondary | ICD-10-CM

## 2013-04-15 DIAGNOSIS — I2510 Atherosclerotic heart disease of native coronary artery without angina pectoris: Secondary | ICD-10-CM

## 2013-04-15 NOTE — Telephone Encounter (Signed)
Spoke to patient. Result given . Verbalized understanding Patient will be seeing Gaspar Bidding today - regular appointment.

## 2013-04-15 NOTE — Progress Notes (Signed)
Quick Note:  No real change in overall pump function. Did did not improve, but did not worsen. It would appear that the damage to the heart that is relatively permanent. Thankfully the function is not bad enough to make [place] think about things like defibrillators or need for aggressive heart failure medications. I would not make any changes to his current regimen based on these findings. This patient this confirms that with that posT MI.  HARDING,DAVID W, MD  ______

## 2013-04-15 NOTE — Progress Notes (Signed)
Date:  04/15/2013   ID:  Benjamin [name], DOB 01/01/1956, MRN [id number]  PCP:  Cassell Smiles., MD  Primary Cardiologist:  Herbie Baltimore    History of Present Illness: Benjamin [name] is a 58 y.o. male with a PMH below recent history of a large Inferior STEMI with RV infarction and cardiogenic shock. He is a long term smoker who had not seen a doctor in several years. He came directly to the cardiac catheterization lab and was found to have total occlusion of an extremely large caliber RCA. After aspiration thrombectomy he had PCI with a large bare-metal stent, but had residual thrombus embolized down stream into the RPDA and PL system. He was continued overnight on Integrilin. While initially hypertensive postoperatively with high LVDP, after initial diuresis she became hypotensive requiring pressors - features consistent with RV infarct. His EF by echocardiogram was 40-45% with severe hypokinesis to akinesis of the inferoseptal wall and incoordinate septal motion. He was also treated for a concommitant right-sided pneumonia as part of a COPD exacerbation. His hospital course was also complicated by recurrent atrial fibrillation/flutter with a rapid rate. He initially underwent cardioversion but reverted right back into the rhythm. He was started on amiodarone infusion, and spontaneously converted.   Patient presents today for followup to his previous visit a couple weeks ago. Noticed some mild improvement in his breathing and some improvement in his lower extremity edema. 2-D echocardiogram was essentially unchanged from prior with an ejection fraction of 45% with grade 1 diastolic dysfunction. BNP when checked was normal had 2 serum creatinines were within normal limits.  He is chronically short of breath on home oxygen. He does sleep on 2 pillows but has not reported any paroxysmal nocturnal dyspnea.  He maintains a mild lower extremity edema greater on the right than the left  The patient  currently denies nausea, vomiting, fever, chest pain, orthopnea, dizziness, cough, congestion, abdominal pain, hematochezia, melena,claudication.  Wt Readings from Last 3 Encounters:  04/15/13 212 lb 1.6 oz (96.208 kg)  03/30/13 213 lb (96.616 kg)  01/01/13 206 lb 4.8 oz (93.577 kg)     Past Medical History  Diagnosis Date  . Tobacco abuse     40 Pk-yr (1- 1 1/2 PPD) --> Quit 11/18/2012 when presented with STEMI  . COPD (chronic obstructive pulmonary disease)   . STEMI of inferior wall 11/18/12- RCA BMS 11/17/2012  . CAD S/P percutaneous coronary angioplasty  11/18/2012    - PCI mid RCA - 5.0 mm x 24 mm VeriFlex BMS (5.6 mm)  . Atrial fibrillation with RVR - peri-MI; recurrent after DCCV 11/19/2012  . Cardiogenic shock - post MI with RV infarction 11/19/2012  . Dyslipidemia- low HDL 11/19/2012    Current Outpatient Prescriptions  Medication Sig Dispense Refill  . acetaminophen (TYLENOL) 500 MG tablet Take by mouth every 6 (six) hours as needed for pain.      [name] albuterol (PROVENTIL HFA;VENTOLIN HFA) 108 (90 BASE) MCG/ACT inhaler Inhale 2 puffs into the lungs every 6 (six) hours as needed for wheezing.      [name] amiodarone (PACERONE) 200 MG tablet Take 1 tablet (200 mg total) by mouth daily.  90 tablet  3  . apixaban (ELIQUIS) 5 MG TABS tablet Take 1 tablet (5 mg total) by mouth 2 (two) times daily.  60 tablet  11  . atorvastatin (LIPITOR) 10 MG tablet Take 1 tablet (10 mg total) by mouth daily at 6 PM.  30 tablet  5  . clopidogrel (PLAVIX)  75 MG tablet Take 1 tablet (75 mg total) by mouth daily.  30 tablet  11  . Dextromethorphan-Guaifenesin (MUCINEX FAST-MAX DM MAX) 5-100 MG/5ML LIQD Take 15 mLs by mouth every 4 (four) hours as needed (for congestion).      . furosemide (LASIX) 40 MG tablet Take 1 tablet (40 mg total) by mouth daily.  30 tablet  3  . levalbuterol (XOPENEX) 0.63 MG/3ML nebulizer solution Take 3 mLs (0.63 mg total) by nebulization 4 (four) times daily.  3 mL  12  .  losartan (COZAAR) 25 MG tablet Take 1 tablet (25 mg total) by mouth daily.  30 tablet  11  . nitroGLYCERIN (NITROSTAT) 0.4 MG SL tablet Place 1 tablet (0.4 mg total) under the tongue every 5 (five) minutes x 3 doses as needed for chest pain.  25 tablet  2  . OXYGEN-HELIUM IN Inhale into the lungs. Using 4 liters nasal cannula ,continuous      . potassium chloride (K-DUR) 10 MEQ tablet Take 1 tablet (10 mEq total) by mouth daily.  30 tablet  5  . Umeclidinium-Vilanterol (ANORO ELLIPTA) 62.5-25 MCG/INH AEPB Inhale 1 puff into the lungs daily.       No current facility-administered medications for this visit.    Allergies:    Allergies  Allergen Reactions  . Codeine     Red blotches  . Penicillins     Childhood reaction    Social History:  The patient  reports that he has quit smoking. He has never used smokeless tobacco. He reports that he does not drink alcohol or use illicit drugs.   Family history:   Family History  Problem Relation Age of Onset  . Heart attack Mother   . Heart attack Father   . Cancer Sister     ROS:  Please see the history of present illness.  All other systems reviewed and negative.   PHYSICAL EXAM: VS:  BP 140/90  Pulse 69  Ht 5\' 11"  (1.803 m)  Wt 212 lb 1.6 oz (96.208 kg)  BMI 29.60 kg/m2 Well nourished, well developed, in no acute distress HEENT: Pupils are equal round react to light accommodation extraocular movements are intact.  Neck: no JVDNo cervical lymphadenopathy. Cardiac: Regular rate and rhythm without murmurs rubs or gallops. Lungs:  Decreased breath sounds bilaterally no wheezing, rhonchi or rales Abd: Somewhat tense and distended, nontender, positive bowel sounds all quadrants, Ext: 1+ lower extremity edema.  2+ radial and 1+ dorsalis pedis pulses. Skin: warm and dry Neuro:  Grossly normal  EKG:  Normal sinus rhythm at 69 beats per minute  ASSESSMENT AND PLAN:  Problem List Items Addressed This Visit   Cardiomyopathy, ischemic -  EF 40-45% (Chronic)   Acute on chronic systolic heart failure     A. initially increase patient's Lasix a couple weeks ago because of the large weight gain that was charted the patient noticed some could decrease his weight with Lasix. Repeat echocardiogram was essentially unchanged and his BMP was within normal limits. The increased Lasix was discontinued and then the patient noticed a 3 pound weight gain.  He'll return to work on once daily Lasix of 40 mg. Continue to monitor his weight. I think he is about his tuneup was examined at think his severe COPD he is a major contributor.  As above weight should be considered his dry weight.  He notices a 2 pound gain in 24 hours or 5 pound gain in 7 days, he'll increase his Lasix  to twice daily until he returns to baseline     Other Visit Diagnoses   CAD (coronary artery disease)    -  Primary    Relevant Orders       EKG 12-Lead

## 2013-04-15 NOTE — Assessment & Plan Note (Signed)
A. initially increase patient's Lasix a couple weeks ago because of the large weight gain that was charted the patient noticed some could decrease his weight with Lasix. Repeat echocardiogram was essentially unchanged and his BMP was within normal limits. The increased Lasix was discontinued and then the patient noticed a 3 pound weight gain.  He'll return to work on once daily Lasix of 40 mg. Continue to monitor his weight. I think he is about his tuneup was examined at think his severe COPD he is a major contributor.  As above weight should be considered his dry weight.  He notices a 2 pound gain in 24 hours or 5 pound gain in 7 days, he'll increase his Lasix to twice daily until he returns to baseline

## 2013-04-15 NOTE — Telephone Encounter (Signed)
Message copied by Raiford Simmonds on Wed Apr 15, 2013 10:37 AM ------      Message from: Ellyn Hack, DAVID W      Created: Wed Apr 15, 2013  2:33 AM       No real change in overall pump function. Did did not improve, but did not worsen. It would appear that the damage to the heart that is relatively permanent. Thankfully the function is not bad enough to make [place] think about things like defibrillators or need for aggressive heart failure medications.      I would not make any changes to his current regimen based on these findings. This patient this confirms that with that posT MI.            Leonie Man, MD       ------

## 2013-04-15 NOTE — Patient Instructions (Signed)
1.  Continue lasix once daily.  If your weight decreases 10 pounds, hold the lasix.  If you increase 2 pounds in 24 hours or 5 pounds in a week, take it twice daily.   2.  Follow up with Dr. Ellyn Hack in three months.

## 2013-04-17 ENCOUNTER — Encounter (HOSPITAL_COMMUNITY): Payer: BC Managed Care – PPO

## 2013-04-20 ENCOUNTER — Encounter (HOSPITAL_COMMUNITY)
Admission: RE | Admit: 2013-04-20 | Discharge: 2013-04-20 | Disposition: A | Discharge status: Home / Self Care | Payer: BC Managed Care – PPO | Source: Ambulatory Visit | Attending: Cardiology | Admitting: Cardiology

## 2013-04-22 ENCOUNTER — Encounter (HOSPITAL_COMMUNITY)
Admission: RE | Admit: 2013-04-22 | Discharge: 2013-04-22 | Disposition: A | Discharge status: Home / Self Care | Payer: BC Managed Care – PPO | Source: Ambulatory Visit | Attending: Cardiology | Admitting: Cardiology

## 2013-04-24 ENCOUNTER — Encounter (HOSPITAL_COMMUNITY)
Admission: RE | Admit: 2013-04-24 | Discharge: 2013-04-24 | Disposition: A | Discharge status: Home / Self Care | Payer: BC Managed Care – PPO | Source: Ambulatory Visit | Attending: Cardiology | Admitting: Cardiology

## 2013-04-27 ENCOUNTER — Encounter (HOSPITAL_COMMUNITY)
Admission: RE | Admit: 2013-04-27 | Discharge: 2013-04-27 | Disposition: A | Discharge status: Home / Self Care | Payer: BC Managed Care – PPO | Source: Ambulatory Visit | Attending: Cardiology | Admitting: Cardiology

## 2013-04-29 ENCOUNTER — Encounter (HOSPITAL_COMMUNITY)
Admission: RE | Admit: 2013-04-29 | Discharge: 2013-04-29 | Disposition: A | Discharge status: Home / Self Care | Payer: BC Managed Care – PPO | Source: Ambulatory Visit | Attending: Cardiology | Admitting: Cardiology

## 2013-04-29 DIAGNOSIS — Z9861 Coronary angioplasty status: Secondary | ICD-10-CM | POA: Insufficient documentation

## 2013-04-29 DIAGNOSIS — Z5189 Encounter for other specified aftercare: Secondary | ICD-10-CM | POA: Insufficient documentation

## 2013-04-29 DIAGNOSIS — I252 Old myocardial infarction: Secondary | ICD-10-CM | POA: Insufficient documentation

## 2013-05-01 ENCOUNTER — Encounter (HOSPITAL_COMMUNITY): Payer: BC Managed Care – PPO

## 2013-05-05 NOTE — Progress Notes (Signed)
Cardiac Rehabilitation Program Outcomes Report   Orientation:  01/01/2013 Graduate Date:  04/29/2013 Discharge Date:  04/29/2013 # of sessions completed: 36 DX: CAD,Cardiomyopathy, Stent X 1 and MI  Cardiologist: Benita Stabile MD:  Haynes Mercades Bajaj Time:  11:00  A.  Exercise Program:  Tolerates exercise @ 3.84 METS for 15 minutes, Walk Test Results:  Post: Post walk test not performedpatient had a prior engagement. and Discharged to home exercise program.  Anticipated compliance:  excellent  B.  Mental Health:  Good mental attitude  C.  Education/Instruction/Skills  Accurately checks own pulse.  Rest:  79  Exercise:  98, Knows THR for exercise, Uses Perceived Exertion Scale and/or Dyspnea Scale and Attended 13 education classes  Uses Perceived Exertion Scale and/or Dyspnea Scale  D.  Nutrition/Weight Control/Body Composition:  Adherence to prescribed nutrition program: good    E.  Blood Lipids    Lab Results  Component Value Date   CHOL 204* 03/09/2013   HDL 66 03/09/2013   LDLCALC 115* 03/09/2013   TRIG 114 03/09/2013   CHOLHDL 3.1 03/09/2013    F.  Lifestyle Changes:  Making positive lifestyle changes and Not smoking:  Quit 2014  G.  Symptoms noted with exercise:  Asymptomatic  Report Completed By:  Oletta Lamas. Ginna Schuur RN   Comments:  This is patients graduation report. He did very well while in Rehab. He achieved a peak METS of 3.84. His resting HR was 79 and resting BP was 110/80 and his peak HR was 98 and peak BP was 150/75. A follow up call will be made to patient on 1 month, 6 months, and 1 year to see if patient is still exercising.

## 2013-05-06 ENCOUNTER — Telehealth: Payer: Self-pay | Admitting: *Deleted

## 2013-05-06 DIAGNOSIS — E782 Mixed hyperlipidemia: Secondary | ICD-10-CM

## 2013-05-06 DIAGNOSIS — Z79899 Other long term (current) drug therapy: Secondary | ICD-10-CM

## 2013-05-06 NOTE — Telephone Encounter (Signed)
MAIL LETTER AND LABSLIP 

## 2013-05-06 NOTE — Telephone Encounter (Signed)
Message copied by Raiford Simmonds on Wed May 06, 2013  3:09 PM ------      Message from: Raiford Simmonds      Created: Fri Mar 13, 2013  2:23 PM       Mail lab slip cmp lipid schedule for 05/2013 ------

## 2013-05-25 ENCOUNTER — Telehealth: Payer: Self-pay

## 2013-05-25 MED ORDER — ATORVASTATIN CALCIUM 10 MG PO TABS: 10.0000 mg | ORAL_TABLET | Freq: Every day | ORAL | Status: DC

## 2013-05-25 NOTE — Telephone Encounter (Signed)
Rx was sent to pharmacy electronically. 

## 2013-05-28 ENCOUNTER — Telehealth: Payer: Self-pay

## 2013-05-28 MED ORDER — ATORVASTATIN CALCIUM 10 MG PO TABS: 10.0000 mg | ORAL_TABLET | Freq: Every day | ORAL | Status: DC

## 2013-05-28 NOTE — Telephone Encounter (Signed)
Rx was sent to pharmacy electronically. 

## 2013-06-30 LAB — LIPID PANEL
CHOLESTEROL: 204 mg/dL — AB (ref 0–200)
HDL: 61 mg/dL (ref >39)
LDL CALC: 116 mg/dL — AB (ref 0–99)
Total CHOL/HDL Ratio: 3.3 Ratio
Triglycerides: 134 mg/dL (ref <150)
VLDL: 27 mg/dL (ref 0–40)

## 2013-07-01 LAB — COMPREHENSIVE METABOLIC PANEL
ALT: 17 U/L (ref 0–53)
AST: 18 U/L (ref 0–37)
Albumin: 4.1 g/dL (ref 3.5–5.2)
Alkaline Phosphatase: 53 U/L (ref 39–117)
BILIRUBIN TOTAL: 0.4 mg/dL (ref 0.2–1.2)
BUN: 20 mg/dL (ref 6–23)
CHLORIDE: 97 meq/L (ref 96–112)
CO2: 34 meq/L — AB (ref 19–32)
Calcium: 9.1 mg/dL (ref 8.4–10.5)
Creat: 0.89 mg/dL (ref 0.50–1.35)
Glucose, Bld: 85 mg/dL (ref 70–99)
Potassium: 4.3 mEq/L (ref 3.5–5.3)
SODIUM: 139 meq/L (ref 135–145)
TOTAL PROTEIN: 6.8 g/dL (ref 6.0–8.3)

## 2013-07-02 NOTE — Telephone Encounter (Signed)
Quick Note:  Labs are essentially stable. Cholesterol still almost identical - lets increase Atorvastatin to 40 mg for now.  Leonie Man, MD  ______

## 2013-07-03 ENCOUNTER — Telehealth: Payer: Self-pay | Admitting: *Deleted

## 2013-07-03 MED ORDER — ATORVASTATIN CALCIUM 40 MG PO TABS: 40.0000 mg | ORAL_TABLET | Freq: Every day | ORAL | Status: DC

## 2013-07-03 NOTE — Telephone Encounter (Signed)
Left message on answer machine to call back. 

## 2013-07-03 NOTE — Telephone Encounter (Signed)
Message copied by Raiford Simmonds on Fri Jul 03, 2013 12:09 PM ------      Message from: Leonie Man      Created: Thu Jul 02, 2013  6:57 PM       Labs are essentially stable.      Cholesterol still almost identical - lets increase Atorvastatin to 40 mg for now.            Leonie Man, MD       ------

## 2013-07-03 NOTE — Telephone Encounter (Signed)
Returning you call about his lab results

## 2013-07-03 NOTE — Telephone Encounter (Signed)
Spoke to patient. Result given . Verbalized understanding E-scribed atorvastatin 40 mg per order.

## 2013-07-14 ENCOUNTER — Telehealth: Payer: Self-pay | Admitting: Cardiology

## 2013-07-14 ENCOUNTER — Encounter: Payer: Self-pay | Admitting: Cardiology

## 2013-07-14 ENCOUNTER — Ambulatory Visit (INDEPENDENT_AMBULATORY_CARE_PROVIDER_SITE_OTHER): Payer: BC Managed Care – PPO | Admitting: Cardiology

## 2013-07-14 VITALS — BP 132/94 | HR 90 | Ht 71.0 in | Wt 220.2 lb

## 2013-07-14 DIAGNOSIS — I480 Paroxysmal atrial fibrillation: Secondary | ICD-10-CM

## 2013-07-14 DIAGNOSIS — E785 Hyperlipidemia, unspecified: Secondary | ICD-10-CM

## 2013-07-14 DIAGNOSIS — I2510 Atherosclerotic heart disease of native coronary artery without angina pectoris: Secondary | ICD-10-CM

## 2013-07-14 DIAGNOSIS — I255 Ischemic cardiomyopathy: Secondary | ICD-10-CM

## 2013-07-14 DIAGNOSIS — I2589 Other forms of chronic ischemic heart disease: Secondary | ICD-10-CM

## 2013-07-14 DIAGNOSIS — J42 Unspecified chronic bronchitis: Secondary | ICD-10-CM

## 2013-07-14 DIAGNOSIS — R0609 Other forms of dyspnea: Secondary | ICD-10-CM

## 2013-07-14 DIAGNOSIS — Z87891 Personal history of nicotine dependence: Secondary | ICD-10-CM

## 2013-07-14 DIAGNOSIS — I4891 Unspecified atrial fibrillation: Secondary | ICD-10-CM

## 2013-07-14 DIAGNOSIS — R0989 Other specified symptoms and signs involving the circulatory and respiratory systems: Secondary | ICD-10-CM

## 2013-07-14 DIAGNOSIS — J449 Chronic obstructive pulmonary disease, unspecified: Secondary | ICD-10-CM

## 2013-07-14 DIAGNOSIS — I5042 Chronic combined systolic (congestive) and diastolic (congestive) heart failure: Secondary | ICD-10-CM

## 2013-07-14 DIAGNOSIS — Z9861 Coronary angioplasty status: Secondary | ICD-10-CM

## 2013-07-14 MED ORDER — LOSARTAN POTASSIUM 50 MG PO TABS
50.0000 mg | ORAL_TABLET | Freq: Every day | ORAL | Status: DC
Start: 2013-07-14 — End: 2013-09-17

## 2013-07-14 MED ORDER — PREDNISONE (PAK) 10 MG PO TABS: ORAL_TABLET | Freq: Every day | ORAL | Status: DC

## 2013-07-14 NOTE — Patient Instructions (Addendum)
STOP  AMIODARONE  INCREASE LOSARTAN 50 MG ONE TABLET DAILY.  TAKE LASIX TWICE A DAY FOR NEXT 3 DAYS THEN GO BACK TO ONE TABLET EVERYDAY  TAKE A  12 DAY Rattan -PAK   Your physician wants you to follow-up in  3 weeks  DR HARDING.  You will receive a reminder letter in the mail two months in advance. If you don't receive a letter, please call our office to schedule the follow-up appointment.

## 2013-07-15 ENCOUNTER — Ambulatory Visit (HOSPITAL_COMMUNITY)
Admission: RE | Admit: 2013-07-15 | Discharge: 2013-07-15 | Disposition: A | Discharge status: Home / Self Care | Payer: BC Managed Care – PPO | Source: Ambulatory Visit | Attending: Cardiology | Admitting: Cardiology

## 2013-07-15 DIAGNOSIS — J9819 Other pulmonary collapse: Secondary | ICD-10-CM | POA: Insufficient documentation

## 2013-07-15 DIAGNOSIS — J4489 Other specified chronic obstructive pulmonary disease: Secondary | ICD-10-CM | POA: Insufficient documentation

## 2013-07-15 DIAGNOSIS — J449 Chronic obstructive pulmonary disease, unspecified: Secondary | ICD-10-CM | POA: Insufficient documentation

## 2013-07-15 DIAGNOSIS — R0609 Other forms of dyspnea: Secondary | ICD-10-CM

## 2013-07-15 NOTE — Telephone Encounter (Signed)
Late entry Spoke to pharmacy on 07/14/13. Clarification on patient's prednisone pack - prednisone pack is 8 day or 12 day not 10 day.  okay to change to 12 day pack per Dr Ellyn Hack. Pharmacy received verbal order.

## 2013-07-16 ENCOUNTER — Telehealth: Payer: Self-pay | Admitting: *Deleted

## 2013-07-16 LAB — PRO B NATRIURETIC PEPTIDE: Pro B Natriuretic peptide (BNP): 104.3 pg/mL (ref <126)

## 2013-07-16 LAB — BASIC METABOLIC PANEL
BUN: 18 mg/dL (ref 6–23)
CO2: 32 mEq/L (ref 19–32)
CREATININE: 1 mg/dL (ref 0.50–1.35)
Calcium: 9.6 mg/dL (ref 8.4–10.5)
Chloride: 95 mEq/L — ABNORMAL LOW (ref 96–112)
Glucose, Bld: 95 mg/dL (ref 70–99)
Potassium: 4.5 mEq/L (ref 3.5–5.3)
Sodium: 139 mEq/L (ref 135–145)

## 2013-07-16 LAB — TSH: TSH: 2.773 u[IU]/mL (ref 0.350–4.500)

## 2013-07-16 NOTE — Telephone Encounter (Signed)
Spoke to patient. Result given . Verbalized understanding  

## 2013-07-16 NOTE — Telephone Encounter (Signed)
Message copied by Raiford Simmonds on Thu Jul 16, 2013  8:43 AM ------      Message from: Leonie Man      Created: Wed Jul 15, 2013  5:59 PM       As expected - severe COPD but no evidence of PNA or CHF.            Leonie Man, MD       ------

## 2013-07-18 ENCOUNTER — Encounter: Payer: Self-pay | Admitting: Cardiology

## 2013-07-18 DIAGNOSIS — R0609 Other forms of dyspnea: Secondary | ICD-10-CM | POA: Insufficient documentation

## 2013-07-18 NOTE — Assessment & Plan Note (Signed)
Status post PCI to a very large caliber RCA with a 5 mm stent. He remains on Plavix without aspirin since he is on Eliquis as well. No active anginal symptoms.

## 2013-07-18 NOTE — Progress Notes (Signed)
PCP: [name], MD  Clinic Note: Chief Complaint  Patient presents with  . 3 MONTH VISIT    SOB,WHEEZING, NO CHEST PAIN,  BLOATED STOMACH    HPI: Benjamin [name] is a 58 y.o. male with a Cardiovascular Problem List below who presents today for ~3 month follow-up. Ms. Kleinfelter has a long-standing history of significant COPD with a 40+-pack-year smoking history who suffered an inferior STEMI back in October of 2014 with occlusion of a very large caliber RCA to enlarge bare-metal stent. His hospital course was complicated by cardiac shock and atrial fibrillation requiring cardioversion. When I saw him back in November, he said he was feeling well as he had for years. Unfortunately, he saw Tarri Fuller, PA back in March for worsening dyspnea and edema.scalloping of the knee showed moderately reduced EF of 40-45% with inferoseptal hypokinesis and high filling pressures.he was on high-dose of Lasix for short term and then somehow the dosing was changed.  Interval History:  He presents today more short of breath he has been ina few months. He is about 8 pounds up from March, and is noting more edema. He is pretty significant exertional dyspnea and occasional PND and orthopnea. He denies any of his anginal chest tightness and pressure with rest or exertion. No rapid or irregular heartbeats to suggest recurrence of A. Fib. No melena, hematochezia hematuria, claudication. No syncope/near syncope, TIAs amaurosis fugax symptoms.   Past Medical History  Diagnosis Date  . Tobacco abuse     40 Pk-yr (1- 1 1/2 PPD) --> Quit 11/18/2012 when presented with STEMI  . COPD (chronic obstructive pulmonary disease)   . STEMI of inferior wall 11/18/12- RCA BMS 11/17/2012    Cardiogenic shock - post MI with RV infarction [785.51]  . CAD S/P percutaneous coronary angioplasty  11/18/2012    - PCI mid RCA - 5.0 mm x 24 mm VeriFlex BMS (5.6 mm)  . Atrial fibrillation with RVR - peri-MI; recurrent after DCCV  11/19/2012  . Ischemic cardiomyopathy 11/19/2012    EF 40-45% with inferoseptal hypokinesis, grade 1-2 diastolic dysfunction with high filling pressures.  . Dyslipidemia- low HDL 11/19/2012  . Chronic combined systolic and diastolic heart failure, NYHA class 2 11/17/2012    Prior Cardiac Evaluation and Past Surgical History: Past Surgical History  Procedure Laterality Date  . Dental extractions    . Coronary angioplasty with stent placement Right 11/17/12    Mid RCA aspiration thrombectomy and PCI with VeriFlex BMS 5.0 mm x 24 mm (5.6 mm; also PTCA of distal RPL 4 distal embolization.  . Transthoracic echocardiogram  11/18/2012    Normal LV size, moderate reduced function 40-45% with severe HK-AK of the inferoseptal wall with incoordinate septal motion.    MEDICATIONS AND ALLERGIES REVIEWED IN EPIC No Change in Social and Family History  ROS: A comprehensive Review of Systems - Negative except mild arthralgias   PHYSICAL EXAM BP 132/94  Pulse 90  Ht 5\' 11"  (1.803 m)  Wt 220 lb 3.2 oz (99.882 kg)  BMI 30.73 kg/m2 General appearance: alert, cooperative, appears older than stated age, mild distress and somewhat ill-appearing; he isusing home oxygen and was breathing hard when walking into the clinic Neck: moderate the elevated JVP; supple no LAD and no carotid bruit. Lungs: Diffuse rhonchorous wheezing, that is notably improved from hospitalization; no longer wet sounding. He has increased AP diameter with prolonged expiratory phase with expiratory wheezing.  Heart: regular rate and rhythm, S1, S2 normal, no murmur, click, rub  or gallop and normal apical impulse  Abdomen: soft, non-tender; bowel sounds normal; no masses, no organomegaly  Extremities: extremities normal, atraumatic, no cyanosis; 1-2+ bilateral LE edema; Pulses: 2+ and symmetric  Neurologic: Grossly normal   Recent Labs :   BUN/creatinine 20/0.89.  ASSESSMENT / PLAN: DOE (dyspnea on exertion) Not really sure what  to make of his exertional dyspnea. The systolic standpoint his EF is not bad enough to cause him to have exertional dyspnea, but he does also have diastolic dysfunction. This is in conjunction with his severe COPD. H. He strikes his eye but he is now noticing more symptoms.1 key point is that he is up 8 pounds which would suggest increased fluid volume. One other concern is the possibility of amiodarone toxicity. He has not had anymore A. Fib since his hospitalization.  Plan: Check pro BNP, BNP and chest x-ray to look for signs of pulmonary edema or pneumonia  Increase ARB for additional afterload reduction  DC amiodarone  Change Lasix to 40 twice a day  As I am not sure whether or not this is COPD or even possible amiodarone toxicity related or CHF related we'll also give pulse dose of steroids  Cardiomyopathy, ischemic - EF 40-45% Initially post MI he was doing so well, the endoscopy was adequately diuresis, now his not fully diuresed.  Plan: Increase ARB, increase Lasix and then adjust to a sliding scale Lasix plan. Will then add spironolactone at next visit depending on how his labs look.  Chronic combined systolic and diastolic heart failure, NYHA class 2 See above for plan. We'll need to use sliding scale Lasix.  Continue to titrate up afterload reduction. He is not on a beta blocker, mostly because of COPD, but anticipate he'll need additional rate control as we are stopping his amiodarone  CAD S/P percutaneous coronary angioplasty - PCI mid RCA - 5.0 mm x 24 mm VeriFlex BMS (5.6 mm) Status post PCI to a very large caliber RCA with a 5 mm stent. He remains on Plavix without aspirin since he is on Eliquis as well. No active anginal symptoms.  Paroxysmal Atrial fibrillation with RVR - peri-MI; recurrent after DCCV As far as a hotel, he is not any more recurrences of A. Fib. Anticoagulated with Xarelto.  Plan: DC amiodarone. We'll need to monitor for tachycardia and consider adding  bisoprolol or Toprol to his regimen.  COPD (chronic obstructive pulmonary disease) - Now on Home O2 He is on chronic oxygen. Concern is that he would be more susceptible to potential amiodarone toxicity and therefore we are stopping amiodarone. We'll give pulse dose steroids to hopefully treat potential COPD exacerbation plus or minus amiodarone toxicity.  Dyslipidemia- low HDL On high-dose of steroids now. Still not very adequate control. Will anticipate need to add Zetia at  his next visit.  Former heavy cigarette smoker (20-39 per day) I did congratulate him with having finally quit smoking.    Orders Placed This Encounter  Procedures  . DG Chest 2 View    Standing Status: Future     Number of Occurrences: 1     Standing Expiration Date: 09/12/2014    Order Specific Question:  Reason for Exam (SYMPTOM  OR DIAGNOSIS REQUIRED)    Answer:  DOE,    Order Specific Question:  Preferred imaging location?    Answer:  [facility]  . Pro b natriuretic peptide (BNP)  . Basic metabolic panel  . TSH  . EKG 12-Lead   Meds ordered  this encounter  Medications  . SPIRIVA HANDIHALER 18 MCG inhalation capsule    Sig: Place 18 mcg into inhaler and inhale daily.   . SYMBICORT 160-4.5 MCG/ACT inhaler    Sig: Inhale 2 puffs into the lungs 2 (two) times daily.   [name] triamcinolone cream (KENALOG) 0.1 %    Sig: 1 application as needed.   [name] losartan (COZAAR) 50 MG tablet    Sig: Take 1 tablet (50 mg total) by mouth daily.    Dispense:  30 tablet    Refill:  11  . predniSONE (STERAPRED UNI-PAK) 10 MG tablet    Sig: Take by mouth daily. 60 mg for 2 days,40 mg for 2 days, 30 mg 2 days, 20 mg for 2 days and then 10 mg 3 days  Then stop    Dispense:  43 tablet    Refill:  0    Patient need a 10 day pak   Greater than 45 minutes was spent discussing the patient's symptoms and developing appropriate treatment plan  Followup: 3 weeks  DAVID W. Ellyn Hack, M.D., M.S. Interventional  Cardiologist [facility]

## 2013-07-18 NOTE — Assessment & Plan Note (Signed)
See above for plan. We'll need to use sliding scale Lasix.  Continue to titrate up afterload reduction. He is not on a beta blocker, mostly because of COPD, but anticipate he'll need additional rate control as we are stopping his amiodarone

## 2013-07-18 NOTE — Assessment & Plan Note (Signed)
I did congratulate him with having finally quit smoking.

## 2013-07-18 NOTE — Assessment & Plan Note (Addendum)
Not really sure what to make of his exertional dyspnea. The systolic standpoint his EF is not bad enough to cause him to have exertional dyspnea, but he does also have diastolic dysfunction. This is in conjunction with his severe COPD. H. He strikes his eye but he is now noticing more symptoms.1 key point is that he is up 8 pounds which would suggest increased fluid volume. One other concern is the possibility of amiodarone toxicity. He has not had anymore A. Fib since his hospitalization.  Plan: Check pro BNP, BNP and chest x-ray to look for signs of pulmonary edema or pneumonia  Increase ARB for additional afterload reduction  DC amiodarone  Change Lasix to 40 twice a day  As I am not sure whether or not this is COPD or even possible amiodarone toxicity related or CHF related we'll also give pulse dose of steroids

## 2013-07-18 NOTE — Assessment & Plan Note (Addendum)
On high-dose of steroids now. Still not very adequate control. Will anticipate need to add Zetia at  his next visit.

## 2013-07-18 NOTE — Assessment & Plan Note (Signed)
Initially post MI he was doing so well, the endoscopy was adequately diuresis, now his not fully diuresed.  Plan: Increase ARB, increase Lasix and then adjust to a sliding scale Lasix plan. Will then add spironolactone at next visit depending on how his labs look.

## 2013-07-18 NOTE — Assessment & Plan Note (Signed)
He is on chronic oxygen. Concern is that he would be more susceptible to potential amiodarone toxicity and therefore we are stopping amiodarone. We'll give pulse dose steroids to hopefully treat potential COPD exacerbation plus or minus amiodarone toxicity.

## 2013-07-18 NOTE — Assessment & Plan Note (Signed)
As far as a hotel, he is not any more recurrences of A. Fib. Anticoagulated with Xarelto.  Plan: DC amiodarone. We'll need to monitor for tachycardia and consider adding bisoprolol or Toprol to his regimen.

## 2013-07-24 ENCOUNTER — Other Ambulatory Visit: Payer: Self-pay | Admitting: Cardiology

## 2013-07-24 ENCOUNTER — Telehealth: Payer: Self-pay | Admitting: Cardiology

## 2013-07-24 MED ORDER — FUROSEMIDE 40 MG PO TABS: 40.0000 mg | ORAL_TABLET | Freq: Every day | ORAL | Status: DC

## 2013-07-24 NOTE — Telephone Encounter (Signed)
Need a new prescription for his Furosemide 40 mg #30 Please. Please call to [facility] [phone]

## 2013-07-24 NOTE — Telephone Encounter (Signed)
Refilled -- new prescripton done eariler

## 2013-07-24 NOTE — Telephone Encounter (Signed)
E-SCRIBED LASIX TO [facility] PATIENT AWARE

## 2013-07-28 ENCOUNTER — Telehealth: Payer: Self-pay | Admitting: *Deleted

## 2013-07-28 NOTE — Telephone Encounter (Signed)
Spoke to patient. Result given . Verbalized understanding  

## 2013-07-28 NOTE — Telephone Encounter (Signed)
Message copied by Raiford Simmonds on Tue Jul 28, 2013  2:30 PM ------      Message from: Leonie Man      Created: Fri Jul 24, 2013 10:24 PM       Labs look pretty good.       ProBNP is essentially normal -- argues against CHF.            Leonie Man, MD       ------

## 2013-08-10 ENCOUNTER — Ambulatory Visit (INDEPENDENT_AMBULATORY_CARE_PROVIDER_SITE_OTHER): Payer: BC Managed Care – PPO | Admitting: Cardiology

## 2013-08-10 ENCOUNTER — Encounter: Payer: Self-pay | Admitting: Cardiology

## 2013-08-10 VITALS — BP 126/84 | HR 95 | Ht 71.0 in | Wt 215.2 lb

## 2013-08-10 DIAGNOSIS — R0609 Other forms of dyspnea: Secondary | ICD-10-CM

## 2013-08-10 DIAGNOSIS — I5042 Chronic combined systolic (congestive) and diastolic (congestive) heart failure: Secondary | ICD-10-CM

## 2013-08-10 DIAGNOSIS — I255 Ischemic cardiomyopathy: Secondary | ICD-10-CM

## 2013-08-10 DIAGNOSIS — E785 Hyperlipidemia, unspecified: Secondary | ICD-10-CM

## 2013-08-10 DIAGNOSIS — I4891 Unspecified atrial fibrillation: Secondary | ICD-10-CM

## 2013-08-10 DIAGNOSIS — I2589 Other forms of chronic ischemic heart disease: Secondary | ICD-10-CM

## 2013-08-10 DIAGNOSIS — Z9861 Coronary angioplasty status: Secondary | ICD-10-CM

## 2013-08-10 DIAGNOSIS — I2510 Atherosclerotic heart disease of native coronary artery without angina pectoris: Secondary | ICD-10-CM

## 2013-08-10 DIAGNOSIS — I480 Paroxysmal atrial fibrillation: Secondary | ICD-10-CM

## 2013-08-10 DIAGNOSIS — J449 Chronic obstructive pulmonary disease, unspecified: Secondary | ICD-10-CM

## 2013-08-10 DIAGNOSIS — R0989 Other specified symptoms and signs involving the circulatory and respiratory systems: Secondary | ICD-10-CM

## 2013-08-10 DIAGNOSIS — R609 Edema, unspecified: Secondary | ICD-10-CM

## 2013-08-10 MED ORDER — FUROSEMIDE 40 MG PO TABS: 40.0000 mg | ORAL_TABLET | Freq: Two times a day (BID) | ORAL | Status: DC

## 2013-08-10 NOTE — Patient Instructions (Addendum)
DRY WEIGHT 210 LB LASIX  40 MG TWICE A DAY , ALTERNATE LASIX 40 MG  DAILY  FOR 1 WEEK , THEN GO BACK TO 40 MG DAILY.   HAVE LABS DRAW AT PRIMARY (BMP,PRO BNP)  Your physician wants you to follow-up in 3 MONTHS DR HARDING. You will receive a reminder letter in the mail two months in advance. If you don't receive a letter, please call our office to schedule the follow-up appointment.

## 2013-08-11 LAB — BASIC METABOLIC PANEL
BUN: 26 mg/dL — AB (ref 6–23)
CO2: 31 meq/L (ref 19–32)
Calcium: 9 mg/dL (ref 8.4–10.5)
Chloride: 100 mEq/L (ref 96–112)
Creat: 0.99 mg/dL (ref 0.50–1.35)
GLUCOSE: 94 mg/dL (ref 70–99)
POTASSIUM: 4.2 meq/L (ref 3.5–5.3)
Sodium: 139 mEq/L (ref 135–145)

## 2013-08-12 ENCOUNTER — Encounter: Payer: Self-pay | Admitting: Cardiology

## 2013-08-12 LAB — PRO B NATRIURETIC PEPTIDE: PRO B NATRI PEPTIDE: 119.8 pg/mL (ref <126)

## 2013-08-12 NOTE — Assessment & Plan Note (Signed)
His HDL is definitely done better since his hospital stay, his total cholesterol, as has his LDL. He continues to be on 40 mg of atorvastatin. Will need to ordered followup labs at next visit.

## 2013-08-12 NOTE — Assessment & Plan Note (Signed)
Still probably multifactorial. He is no longer on amiodarone. Will recheck Pro BNP and BMP after increased Lasix dosing. Continue with standing twice a day Lasix

## 2013-08-12 NOTE — Assessment & Plan Note (Signed)
Continuing to optimize medications. His last visit I increased his ARB. We can reassess this in followup density increased if his blood pressure will tolerate. He is not on a beta blocker because of his COPD.  Once we have a more euvolemic, would consider using bisoprolol, since he is somewhat tachycardic.

## 2013-08-12 NOTE — Assessment & Plan Note (Addendum)
Status post bare-metal stent placement. He is on Plavix without aspirin due to being on Eliquis for full anti-coagulation for A. fib. No active anginal symptoms despite having dyspnea and heart failure symptoms. He is on statin and has when necessary nitroglycerin.

## 2013-08-12 NOTE — Progress Notes (Addendum)
PCP: [name], MD  Clinic Note: Followup from outpatient acute on chronic combined systolic/diastolic heart failure  HPI: Benjamin [name] is a 58 y.o. male with a Cardiovascular Problem List below who presents today for ~1 month follow-up. Ms. Colden has a long-standing history of significant COPD with a 40+-pack-year smoking history (quit smoking after his admission for MI) who suffered an inferior STEMI back in October of 2014 with occlusion of a very large caliber RCA treated with a large caliber (5.0 mm x 24 mm) VeriFlex bare-metal stent. His hospital course was complicated by cardiac shock and atrial fibrillation requiring cardioversion. When I saw him back in November, he said he was feeling well as he had for years. Unfortunately, he is started having worsening episodes that sound consistent with heart failure since March. He had an echocardiogram to followup his hospitalization which showed an  EF of 40-45% with inferoseptal hypokinesis and high filling pressures. He was treated with diuretics. I then saw him in June and he was persistently dyspneic. He did note improvement with IV Lasix, but has had problems with swelling and PND/orthopnea with increased oxygen requirement at home. When I saw him I increased his 6 to stay for several days that increased dose, treated him with steroids for possible COPD exacerbation plus or minus amiodarone toxicity. I stopped the amiodarone onboard for atrial fibrillation.  Interval History: He comes in today stating that he didn't feel much better after the initial therapy, especially when he is on steroids. He now has drifted back down a little but but still feels better than he did last saw him last month. He has exertional dyspnea with intermittent PND and orthopnea. This has gotten better with a diuretic. He continues to deny any of his anginal chest tightness and pressure with rest or exertion. No rapid or irregular heartbeats to suggest  recurrence of A. Fib. No melena, hematochezia hematuria, claudication. No syncope/near syncope, TIAs amaurosis fugax symptoms.  Importantly, he also denies any significant fevers or chills, worsening or productive cough.  Past Medical History  Diagnosis Date  . Tobacco abuse     40 Pk-yr (1- 1 1/2 PPD) --> Quit 11/18/2012 when presented with STEMI  . COPD (chronic obstructive pulmonary disease)   . STEMI of inferior wall 11/18/12- RCA BMS 11/17/2012    Cardiogenic shock - post MI with RV infarction [785.51]  . CAD S/P percutaneous coronary angioplasty  11/18/2012    - PCI mid RCA - 5.0 mm x 24 mm VeriFlex BMS (5.6 mm)  . Atrial fibrillation with RVR - peri-MI; recurrent after DCCV 11/19/2012  . Ischemic cardiomyopathy 11/19/2012    EF 40-45% with inferoseptal hypokinesis, grade 1-2 diastolic dysfunction with high filling pressures.  . Dyslipidemia- low HDL 11/19/2012  . Chronic combined systolic and diastolic heart failure, NYHA class 2 11/17/2012   Prior Cardiac Evaluation and Past Surgical History: Past Surgical History  Procedure Laterality Date  . Dental extractions    . Coronary angioplasty with stent placement Right 11/17/12    Mid RCA aspiration thrombectomy and PCI with VeriFlex BMS 5.0 mm x 24 mm (5.6 mm; also PTCA of distal RPL 4 distal embolization.  . Transthoracic echocardiogram  11/18/2012    Normal LV size, moderate reduced function 40-45% with severe HK-AK of the inferoseptal wall with incoordinate septal motion.  . Transthoracic echocardiogram  March 15    Moderately reduced LVEF of 40 at 45% with inferoseptal hypokinesis and high filling pressures.   MEDICATIONS AND ALLERGIES REVIEWED  IN EPIC No Change in Social and Family History  ROS: A comprehensive Review of Systems - Negative except mild arthralgias   PHYSICAL EXAM BP 126/84  Pulse 95  Ht 5\' 11"  (1.803 m)  Wt 215 lb 3.2 oz (97.614 kg)  BMI 30.03 kg/m2 General appearance: alert, cooperative, appears  older than stated age, mild distress and somewhat ill-appearing; he isusing home oxygen and was breathing hard when walking into the clinic Neck: moderate the elevated JVP; supple no LAD and no carotid bruit. Lungs: Diffuse rhonchorous wheezing, that is notably improved from hospitalization; no longer wet sounding. He has increased AP diameter with prolonged expiratory phase with expiratory wheezing.  Heart: regular rate and rhythm, S1, S2 normal, no murmur, click, rub or gallop and normal apical impulse  Abdomen: soft, non-tender; bowel sounds normal; no masses, no organomegaly  Extremities: extremities normal, atraumatic, no cyanosis; 1-2+ bilateral LE edema; Pulses: 2+ and symmetric  Neurologic: Grossly normal He has a significant zoster rash under his left breast radiating up the back. This is not yet painful, but is now starting to become uncomfortable.   Recent Labs :   June 16: Pro BNP was 104; BUN/creatinine 18/ 1.0 and potassium 4.5. TSH was normal  ASSESSMENT / PLAN: Chronic combined systolic and diastolic heart failure, NYHA class 2 He is definitely better than he was a last visit. I think still above his dry weight. Who felt that community after his hospitalization when he was weighing about 210 pounds. Recommendations are as follows: DRY WEIGHT 210 LB LASIX  40 MG TWICE A DAY , ALTERNATE LASIX 40 MG  DAILY  FOR 1 WEEK , THEN GO BACK TO 40 MG DAILY -continue with sliding scale after that with 210 pounds his dry weight   Cardiomyopathy, ischemic - EF 40-45% Continuing to optimize medications. His last visit I increased his ARB. We can reassess this in followup density increased if his blood pressure will tolerate. He is not on a beta blocker because of his COPD.  Once we have a more euvolemic, would consider using bisoprolol, since he is somewhat tachycardic.  CAD S/P percutaneous coronary angioplasty - PCI mid RCA - 5.0 mm x 24 mm VeriFlex BMS (5.6 mm) Status post bare-metal stent  placement. He is on Plavix without aspirin due to being on Eliquis for full anti-coagulation for A. fib. No active anginal symptoms despite having dyspnea and heart failure symptoms. He is on statin and has when necessary nitroglycerin.  Paroxysmal Atrial fibrillation with RVR - peri-MI; recurrent after DCCV No recurrence of A. fib despite being off of amiodarone. If his rate does increase would consider using bisoprolol as a Beta 1 selective Beta Blocker in setting of COPD; for now continue Eliquis  DOE (dyspnea on exertion) Still probably multifactorial. He is no longer on amiodarone. Will recheck Pro BNP and BMP after increased Lasix dosing. Continue with standing twice a day Lasix  Dyslipidemia- low HDL His HDL is definitely done better since his hospital stay, his total cholesterol, as has his LDL. He continues to be on 40 mg of atorvastatin. Will need to ordered followup labs at next visit.    Orders Placed This Encounter  Procedures  . Basic metabolic panel  . Pro b natriuretic peptide (BNP)   Meds ordered this encounter  Medications  . DALIRESP 500 MCG TABS tablet    Sig:   . furosemide (LASIX) 40 MG tablet    Sig: Take 1 tablet (40 mg total) by mouth 2 (  two) times daily.    Dispense:  60 tablet    Refill:  11   See PCP to discuss zoster Followup: 3 months  Benjamin [name], M.D., M.S. Interventional Cardiologist [facility]

## 2013-08-12 NOTE — Assessment & Plan Note (Signed)
He is definitely better than he was a last visit. I think still above his dry weight. Who felt that community after his hospitalization when he was weighing about 210 pounds. Recommendations are as follows: DRY WEIGHT 210 LB LASIX  40 MG TWICE A DAY , ALTERNATE LASIX 40 MG  DAILY  FOR 1 WEEK , THEN GO BACK TO 40 MG DAILY -continue with sliding scale after that with 210 pounds his dry weight

## 2013-08-12 NOTE — Assessment & Plan Note (Signed)
No recurrence of A. fib despite being off of amiodarone. If his rate does increase would consider using bisoprolol as a Beta 1 selective Beta Blocker in setting of COPD; for now continue Eliquis

## 2013-08-20 ENCOUNTER — Telehealth: Payer: Self-pay | Admitting: Cardiology

## 2013-08-20 NOTE — Telephone Encounter (Signed)
Please call,pt says his feet are swelling so much.

## 2013-08-20 NOTE — Telephone Encounter (Signed)
Spoke to patient  He states he has again 3 lbs over night especially in his feet wgt 214.4 lbs on 08/19/13 , today 217 (08/20/13)  patient states he had one dose of Lasix yesterday , and today  Had 2 doses Lasix. (follow sliding scale per Dr Ellyn Hack last office visit) Pt states he received medication from primary for shingles.  RN informed patient to take 2 doses tomorrow and call with his weight . PATIENT verbalized understanding.

## 2013-08-20 NOTE — Telephone Encounter (Signed)
Keep up with 2 x dose until wgt reduced to baseline.  Leonie Man, MD

## 2013-08-21 NOTE — Telephone Encounter (Signed)
Spoke to patient  Patient states he loss @4  lbs. Over night. He states his weight 212.6 this morning.  Patient is aware to keep with 2 x doses until weight reduced to baseline

## 2013-08-21 NOTE — Telephone Encounter (Signed)
Good.  Thanks  [place]

## 2013-09-11 ENCOUNTER — Telehealth: Payer: Self-pay | Admitting: Cardiology

## 2013-09-11 MED ORDER — FUROSEMIDE 40 MG PO TABS: 40.0000 mg | ORAL_TABLET | Freq: Two times a day (BID) | ORAL | Status: DC

## 2013-09-11 NOTE — Telephone Encounter (Signed)
Mr.Bloodgood is asking that a new prescription sent to his pharmacy with the new dosage on his Lasik. Please call .[name] [facility] .[name] Please Call   Thanks

## 2013-09-11 NOTE — Telephone Encounter (Signed)
Patient states [facility] did not receive the Rx for Lasix sent 08/10/13.  Rx resent electronically.

## 2013-09-14 ENCOUNTER — Other Ambulatory Visit: Payer: Self-pay | Admitting: Physician Assistant

## 2013-09-15 NOTE — Telephone Encounter (Signed)
Rx was sent to pharmacy electronically. 

## 2013-09-17 ENCOUNTER — Telehealth: Payer: Self-pay | Admitting: Cardiology

## 2013-09-17 MED ORDER — LOSARTAN POTASSIUM 100 MG PO TABS: 100.0000 mg | ORAL_TABLET | Freq: Every day | ORAL | Status: DC

## 2013-09-17 NOTE — Telephone Encounter (Signed)
I would like for him to increase his Losartan to 100 mg daily -- this should give more afterload reduction & help diuresis.  Leonie Man, MD

## 2013-09-17 NOTE — Telephone Encounter (Signed)
Pt says his feet having been swelling a lot. He doubled up on his Lasix,they went down a little,wonders if he need to be seen.

## 2013-09-17 NOTE — Telephone Encounter (Signed)
RN INFORMED PATIENT DR HARDING DECISION - INCREASE LOSARTAN TO 100 MG DAILY PATIENT VERBALIZED UNDERSTANDING. NEW SCRIPT SENT

## 2013-09-17 NOTE — Telephone Encounter (Signed)
Spoke to patient.  patient states he has been doubling up lasix for 4 days. Following sliding scale.  Weight range from 209 lbs to 213lbs  Legs swelling  Still did not completely go down., not short of breath, b/p today 136/81 yesterday 107/70  will defer to Dr Ellyn Hack. Patient aware

## 2013-10-13 ENCOUNTER — Other Ambulatory Visit: Payer: Self-pay | Admitting: Cardiovascular Disease

## 2013-10-13 NOTE — Telephone Encounter (Signed)
Rx refill sent to patient pharmacy   

## 2013-11-05 ENCOUNTER — Other Ambulatory Visit (HOSPITAL_COMMUNITY): Payer: Self-pay | Admitting: Respiratory Therapy

## 2013-11-06 ENCOUNTER — Telehealth: Payer: Self-pay | Admitting: Cardiology

## 2013-11-06 NOTE — Telephone Encounter (Signed)
Would like to know if he will need to stop any blood thinners before having a colonoscopy . Please call    Thanks

## 2013-11-06 NOTE — Telephone Encounter (Signed)
SPOKE TO PATIENT COLONOSCOPY HAS NOT BEEN SCHEDULE. PATIENT IS NOT SURE IF IT WILL BE ORDER HE STATES HIS PRIMARY WANTED HIM TO SEE DR Arnoldo Morale IN [place]. WILL DEFER TO DR HARDING.

## 2013-11-07 NOTE — Telephone Encounter (Signed)
He should be ok with stopping Eliquis & Plavix.  Plavix 5 days pre-procedure, Eliquis 24 hr pre-procedure.  There is always a risk of Stent thrombosis - but he is almost 1 year out & should be ok.  Leonie Man, MD

## 2013-11-09 NOTE — Telephone Encounter (Signed)
LEFT MESSAGE TO CALL BACK

## 2013-11-09 NOTE — Telephone Encounter (Signed)
Information given to patient.per Dr Ellyn Hack orders. Patient aware to have Dr Arnoldo Morale office send clearance form for colonoscopy- will send information. Verbalized understanding.

## 2013-11-10 ENCOUNTER — Encounter (HOSPITAL_COMMUNITY): Payer: Self-pay | Admitting: Pharmacy Technician

## 2013-11-11 ENCOUNTER — Ambulatory Visit (HOSPITAL_COMMUNITY)
Admission: RE | Admit: 2013-11-11 | Discharge: 2013-11-11 | Disposition: A | Discharge status: Home / Self Care | Payer: BC Managed Care – PPO | Source: Ambulatory Visit | Attending: Pulmonary Disease | Admitting: Pulmonary Disease

## 2013-11-11 DIAGNOSIS — F17210 Nicotine dependence, cigarettes, uncomplicated: Secondary | ICD-10-CM | POA: Diagnosis not present

## 2013-11-11 DIAGNOSIS — J449 Chronic obstructive pulmonary disease, unspecified: Secondary | ICD-10-CM | POA: Diagnosis present

## 2013-11-11 LAB — PULMONARY FUNCTION TEST
DL/VA % PRED: 43 %
DL/VA: 2.04 ml/min/mmHg/L
DLCO cor % pred: 30 %
DLCO cor: 10.13 ml/min/mmHg
DLCO unc % pred: 30 %
DLCO unc: 10.13 ml/min/mmHg
FEF 25-75 Post: 0.45 L/sec
FEF 25-75 Pre: 0.34 L/sec
FEF2575-%CHANGE-POST: 29 %
FEF2575-%PRED-POST: 13 %
FEF2575-%Pred-Pre: 10 %
FEV1-%CHANGE-POST: 15 %
FEV1-%PRED-POST: 26 %
FEV1-%Pred-Pre: 22 %
FEV1-PRE: 0.88 L
FEV1-Post: 1.01 L
FEV1FVC-%CHANGE-POST: 7 %
FEV1FVC-%PRED-PRE: 48 %
FEV6-%Change-Post: 10 %
FEV6-%Pred-Post: 49 %
FEV6-%Pred-Pre: 44 %
FEV6-Post: 2.37 L
FEV6-Pre: 2.15 L
FEV6FVC-%Change-Post: 2 %
FEV6FVC-%PRED-PRE: 94 %
FEV6FVC-%Pred-Post: 97 %
FVC-%Change-Post: 7 %
FVC-%PRED-PRE: 47 %
FVC-%Pred-Post: 50 %
FVC-Post: 2.53 L
FVC-Pre: 2.36 L
POST FEV1/FVC RATIO: 40 %
PRE FEV6/FVC RATIO: 91 %
Post FEV6/FVC ratio: 93 %
Pre FEV1/FVC ratio: 37 %
RV % PRED: 168 %
RV: 3.79 L
TLC % pred: 98 %
TLC: 7.09 L

## 2013-11-11 MED ORDER — ALBUTEROL SULFATE (2.5 MG/3ML) 0.083% IN NEBU
2.5000 mg | INHALATION_SOLUTION | Freq: Once | RESPIRATORY_TRACT | Status: AC
  Administered 2013-11-11: 2.5 mg via RESPIRATORY_TRACT

## 2013-11-11 NOTE — H&P (Signed)
  NTS SOAP Note  Vital Signs:  Vitals as of: 42/35/3614: Systolic 431: Diastolic 89: Heart Rate 95: Temp 98.62F: Height 3ft 11in: Weight 216Lbs 0 Ounces: BMI 30.13  BMI : 30.13 kg/m2  Subjective: This 58 year old male presents for of needing a TCS.  Has not had a TCS in the past.  No family h/o colon cancer.    Review of Symptoms:  Constitutional:unremarkable   Head:unremarkable Eyes:unremarkable   sinus problems Cardiovascular:  unremarkable Respiratory:dyspnea Gastrointestinheartburn Genitourinary:frequency Musculoskeletal:unremarkable Skin:unremarkable Hematolgic/Lymphatic:unremarkable     Past Medical History:  Reviewed  Past Medical History  Surgical History: inguinal herniorrhaphies Medical Problems: COPD,  HTN,  CAD  Allergies: nkda Medications: advair, spiriva, ventolin, home o2,  eliquis,  lasix,  plavix,  losartin,  nitrostat,  oxycodone   Social History:Reviewed  Social History  Preferred Language: English ([place]) Race:  White Ethnicity: Not Hispanic / Latino Age: 71 Years 8 Months Marital Status:  D Alcohol:  No Recreational drug(s):  No   Smoking Status: Current every day smoker reviewed on 11/10/2013 Started Date: 01/30/1976 Packs per day: 0.50 Functional Status reviewed on mm/dd/yyyy ------------------------------------------------ Bathing: Normal Cooking: Normal Dressing: Normal Driving: Normal Eating: Normal Managing Meds: Normal Oral Care: Normal Shopping: Normal Toileting: Normal Transferring: Normal Walking: Normal Cognitive Status reviewed on mm/dd/yyyy ------------------------------------------------ Attention: Normal Decision Making: Normal Language: Normal Memory: Normal Motor: Normal Perception: Normal Problem Solving: Normal Visual and Spatial: Normal   Family History:Reviewed  Family Health History Family History is Unknown    Objective Information: Wearing o2 Neck:Supple without  lymphadenopathy.  Heart:RRR, no murmur distant lung sounds,  clear  Abdomen:Soft, NT/ND, no HSM, no masses. deferred to procedure  Assessment:Need for screening TCS  Diagnoses: V76.51  Z12.11 Screening for malignant neoplasm of colon (Encounter for screening for malignant neoplasm of colon)  Procedures: 54008 - OFFICE OUTPATIENT VISIT 15 MINUTES    Plan:  Scheduled for screening TCS on 11/20/13.   Patient Education:Alternative treatments to surgery were discussed with patient (and family).  Risks and benefits  of procedure were fully explained to the patient (and family) who gave informed consent. Patient/family questions were addressed.  Follow-up:Pending Surgery

## 2013-11-20 ENCOUNTER — Encounter (HOSPITAL_COMMUNITY)
Admission: RE | Disposition: A | Discharge status: Home / Self Care | Payer: Self-pay | Source: Ambulatory Visit | Attending: General Surgery

## 2013-11-20 ENCOUNTER — Ambulatory Visit (HOSPITAL_COMMUNITY)
Admission: RE | Admit: 2013-11-20 | Discharge: 2013-11-20 | Disposition: A | Discharge status: Home / Self Care | Payer: BC Managed Care – PPO | Source: Ambulatory Visit | Attending: General Surgery | Admitting: General Surgery

## 2013-11-20 DIAGNOSIS — D123 Benign neoplasm of transverse colon: Secondary | ICD-10-CM | POA: Insufficient documentation

## 2013-11-20 DIAGNOSIS — I10 Essential (primary) hypertension: Secondary | ICD-10-CM | POA: Insufficient documentation

## 2013-11-20 DIAGNOSIS — Z9981 Dependence on supplemental oxygen: Secondary | ICD-10-CM | POA: Insufficient documentation

## 2013-11-20 DIAGNOSIS — Z1211 Encounter for screening for malignant neoplasm of colon: Secondary | ICD-10-CM | POA: Diagnosis present

## 2013-11-20 DIAGNOSIS — Z7951 Long term (current) use of inhaled steroids: Secondary | ICD-10-CM | POA: Insufficient documentation

## 2013-11-20 DIAGNOSIS — J449 Chronic obstructive pulmonary disease, unspecified: Secondary | ICD-10-CM | POA: Diagnosis not present

## 2013-11-20 DIAGNOSIS — Z7952 Long term (current) use of systemic steroids: Secondary | ICD-10-CM | POA: Insufficient documentation

## 2013-11-20 DIAGNOSIS — Z7902 Long term (current) use of antithrombotics/antiplatelets: Secondary | ICD-10-CM | POA: Diagnosis not present

## 2013-11-20 DIAGNOSIS — D122 Benign neoplasm of ascending colon: Secondary | ICD-10-CM | POA: Diagnosis not present

## 2013-11-20 DIAGNOSIS — Z79899 Other long term (current) drug therapy: Secondary | ICD-10-CM | POA: Insufficient documentation

## 2013-11-20 DIAGNOSIS — F17210 Nicotine dependence, cigarettes, uncomplicated: Secondary | ICD-10-CM | POA: Diagnosis not present

## 2013-11-20 DIAGNOSIS — I2510 Atherosclerotic heart disease of native coronary artery without angina pectoris: Secondary | ICD-10-CM | POA: Diagnosis not present

## 2013-11-20 HISTORY — PX: COLONOSCOPY: SHX5424

## 2013-11-20 SURGERY — COLONOSCOPY
Anesthesia: Moderate Sedation

## 2013-11-20 MED ORDER — MIDAZOLAM HCL 5 MG/5ML IJ SOLN
INTRAMUSCULAR | Status: AC
  Filled 2013-11-20: qty 10

## 2013-11-20 MED ORDER — MEPERIDINE HCL 50 MG/ML IJ SOLN
INTRAMUSCULAR | Status: AC
  Filled 2013-11-20: qty 1

## 2013-11-20 MED ORDER — MIDAZOLAM HCL 5 MG/5ML IJ SOLN
INTRAMUSCULAR | Status: DC | PRN
  Administered 2013-11-20: 2 mg via INTRAVENOUS
  Administered 2013-11-20: 1 mg via INTRAVENOUS

## 2013-11-20 MED ORDER — STERILE WATER FOR IRRIGATION IR SOLN
Status: DC | PRN
  Administered 2013-11-20: 08:00:00

## 2013-11-20 MED ORDER — SODIUM CHLORIDE 0.9 % IV SOLN
INTRAVENOUS | Status: DC
  Administered 2013-11-20: 1000 mL via INTRAVENOUS

## 2013-11-20 MED ORDER — MEPERIDINE HCL 50 MG/ML IJ SOLN
INTRAMUSCULAR | Status: DC | PRN
  Administered 2013-11-20: 50 mg via INTRAVENOUS

## 2013-11-20 NOTE — Op Note (Signed)
[facility] [address] [place], [address]   COLONOSCOPY PROCEDURE REPORT     EXAM DATE: December 02, 2013  PATIENT NAME:      Benjamin [name], Benjamin [name]           MR #:      [id number]  BIRTHDATE:       28-Sep-1955      VISIT #:     [phone]  ATTENDING:     Aviva Signs, MD     STATUS:     outpatient REFERRING MD:      Kerin Perna, M.D. ASA CLASS:        Class III  INDICATIONS:  The patient is a 58 yr old male here for a colonoscopy due to average risk for colon cancer. PROCEDURE PERFORMED:     Colonoscopy with snare polypectomy MEDICATIONS:     Demerol 50 mg IV and Versed 3 mg IV ESTIMATED BLOOD LOSS:     None  CONSENT: The patient understands the risks and benefits of the procedure and understands that these risks include, but are not limited to: sedation, allergic reaction, infection, perforation and/or bleeding. Alternative means of evaluation and treatment include, among others: physical exam, x-rays, and/or surgical intervention. The patient elects to proceed with this endoscopic procedure.  DESCRIPTION OF PROCEDURE: During intra-op preparation period all mechanical & medical equipment was checked for proper function. Hand hygiene and appropriate measures for infection prevention was taken. After the risks, benefits and alternatives of the procedure were thoroughly explained, Informed consent was verified, confirmed and timeout was successfully executed by the treatment team. A digital exam revealed no abnormalities of the rectum.      The EC-[id number] ([id number]) endoscope was introduced through the anus and advanced to the cecum, which was identified by both the appendix and ileocecal valve. No adverse events experienced. The prep was adequate, using Trilyte. The instrument was then slowly withdrawn as the colon was fully examined.   COLON FINDINGS: A smooth semi-pedunculated polyp ranging between 3-1mm in size was found in the ascending colon.  A  polypectomy was performed using snare cautery.  The resection was complete, the polyp tissue was completely retrieved and sent to histology. Three smooth semi-pedunculated polyps ranging between 3-39mm in size were found in the transverse colon.  Polypectomies were performed using snare cautery.  The resection was complete, the polyp tissue was completely retrieved and sent to histology. Retroflexed views revealed no abnormalities.  The scope was then completely withdrawn from the patient and the procedure terminated. WITHDRAWAL TIME: 16 minutes 0 seconds    ADVERSE EVENTS:      There were no immediate complications.  IMPRESSIONS:     1.  Semi-pedunculated polyp ranging between 3-45mm in size was found in the ascending colon; polypectomy was performed using snare cautery 2.  Three semi-pedunculated polyps ranging between 3-50mm in size were found in the transverse colon; polypectomies were performed using snare cautery  RECOMMENDATIONS:     1.  Await pathology results 2.  Repeat Colonoscopy in 3 years. RECALL:  Aviva Signs, MD eSigned:  Aviva Signs, MD 02-Dec-2013 8:07 AM   cc:  CPT CODES: ICD CODES:  The ICD and CPT codes recommended by this software are interpretations from the data that the clinical staff has captured with the software.  The verification of the translation of this report to the ICD and CPT codes and modifiers is the sole responsibility of the health care institution and practicing physician where this report was generated.  [employer] will not be held responsible for the validity of the ICD and CPT codes included on this report.  AMA assumes no liability for data contained or not contained herein. CPT is a Designer, television/film set of the [employer].   PATIENT NAME:  Benjamin, [name] MR#: [id number]

## 2013-11-20 NOTE — Interval H&P Note (Signed)
History and Physical Interval Note:  11/20/2013 7:31 AM  Benjamin [name]  has presented today for surgery, with the diagnosis of screening  The various methods of treatment have been discussed with the patient and family. After consideration of risks, benefits and other options for treatment, the patient has consented to  Procedure(s): COLONOSCOPY (N/A) as a surgical intervention .  The patient's history has been reviewed, patient examined, no change in status, stable for surgery.  I have reviewed the patient's chart and labs.  Questions were answered to the patient's satisfaction.     Aviva Signs A

## 2013-11-20 NOTE — Discharge Instructions (Signed)
Restart plavix tomorrow.  Colonoscopy, Care After Refer to this sheet in the next few weeks. These instructions provide you with information on caring for yourself after your procedure. Your health care provider may also give you more specific instructions. Your treatment has been planned according to current medical practices, but problems sometimes occur. Call your health care provider if you have any problems or questions after your procedure. WHAT TO EXPECT AFTER THE PROCEDURE  After your procedure, it is typical to have the following:  A small amount of blood in your stool.  Moderate amounts of gas and mild abdominal cramping or bloating. HOME CARE INSTRUCTIONS  Do not drive, operate machinery, or sign important documents for 24 hours.  You may shower and resume your regular physical activities, but move at a slower pace for the first 24 hours.  Take frequent rest periods for the first 24 hours.  Walk around or put a warm pack on your abdomen to help reduce abdominal cramping and bloating.  Drink enough fluids to keep your urine clear or pale yellow.  You may resume your normal diet as instructed by your health care provider. Avoid heavy or fried foods that are hard to digest.  Avoid drinking alcohol for 24 hours or as instructed by your health care provider.  Only take over-the-counter or prescription medicines as directed by your health care provider.  If a tissue sample (biopsy) was taken during your procedure:  Do not take aspirin or blood thinners for 7 days, or as instructed by your health care provider.  Do not drink alcohol for 7 days, or as instructed by your health care provider.  Eat soft foods for the first 24 hours. SEEK MEDICAL CARE IF: You have persistent spotting of blood in your stool 2-3 days after the procedure. SEEK IMMEDIATE MEDICAL CARE IF:  You have more than a small spotting of blood in your stool.  You pass large blood clots in your  stool.  Your abdomen is swollen (distended).  You have nausea or vomiting.  You have a fever.  You have increasing abdominal pain that is not relieved with medicine. Document Released: 08/30/2003 Document Revised: 11/05/2012 Document Reviewed: 09/22/2012 [facility] Patient Information 2015 [place], [place]. This information is not intended to replace advice given to you by your health care provider. Make sure you discuss any questions you have with your health care provider.

## 2013-11-24 ENCOUNTER — Ambulatory Visit (INDEPENDENT_AMBULATORY_CARE_PROVIDER_SITE_OTHER): Payer: BC Managed Care – PPO | Admitting: Cardiology

## 2013-11-24 ENCOUNTER — Other Ambulatory Visit: Payer: Self-pay | Admitting: *Deleted

## 2013-11-24 ENCOUNTER — Encounter: Payer: Self-pay | Admitting: Cardiology

## 2013-11-24 VITALS — BP 110/80 | HR 100 | Ht 71.0 in | Wt 218.0 lb

## 2013-11-24 DIAGNOSIS — I10 Essential (primary) hypertension: Secondary | ICD-10-CM

## 2013-11-24 DIAGNOSIS — J42 Unspecified chronic bronchitis: Secondary | ICD-10-CM

## 2013-11-24 DIAGNOSIS — R0609 Other forms of dyspnea: Secondary | ICD-10-CM

## 2013-11-24 DIAGNOSIS — Z9861 Coronary angioplasty status: Secondary | ICD-10-CM

## 2013-11-24 DIAGNOSIS — I2510 Atherosclerotic heart disease of native coronary artery without angina pectoris: Secondary | ICD-10-CM

## 2013-11-24 DIAGNOSIS — Z79899 Other long term (current) drug therapy: Secondary | ICD-10-CM

## 2013-11-24 DIAGNOSIS — I255 Ischemic cardiomyopathy: Secondary | ICD-10-CM

## 2013-11-24 DIAGNOSIS — I5042 Chronic combined systolic (congestive) and diastolic (congestive) heart failure: Secondary | ICD-10-CM

## 2013-11-24 DIAGNOSIS — E785 Hyperlipidemia, unspecified: Secondary | ICD-10-CM

## 2013-11-24 DIAGNOSIS — I480 Paroxysmal atrial fibrillation: Secondary | ICD-10-CM

## 2013-11-24 MED ORDER — LOSARTAN POTASSIUM 50 MG PO TABS: 50.0000 mg | ORAL_TABLET | Freq: Every day | ORAL | Status: DC

## 2013-11-24 MED ORDER — BISOPROLOL FUMARATE 5 MG PO TABS: 2.5000 mg | ORAL_TABLET | Freq: Every day | ORAL | Status: DC

## 2013-11-24 NOTE — Progress Notes (Signed)
PCP: [name], MD  Clinic Note: Chief Complaint  Patient presents with  . 3 MONTH FOLLOW UP    NO CHEST PAIN , NO EDEMA , SOB -USE 4 LITER OF OXYGEN NASAL CANNULA, HAD COLONOSCOPY LAST FRIDAY- FOUND 3 POLYPS    HPI: Benjamin [name] is a 58 y.o. male with a PMH below who presents today for three-month followup for his CAD he become ischemic cardiomyopathy and chronic combined systolic/ diastolic heart failure. He has a long-standing history of COPD with 40+-pack-year smoking history and is on home oxygen. He had an inferior study in October of 2014 with very large RCA was occluded and old thrombus. This was treated with 5 mm x 24 mm VeriFlex BMS post dilated about 5.5 mm. It was relatively difficult post MI hospital course and cardiac shock and atrial fibrillation that required cardioversion. He really hasn't had much of anything since then. He has a moderate ischemic EF of roughly 40-45% it was roughly stable on followup echo. He is inferoseptal hypokinesis, and also evidence of high filling pressures. Back in July as a followup for hospitalization related to steroids and diuretics. At that time he was doing relatively well. He now comes back for three-month followup immediately has been stable. He is started home weight was ~210 pounds, but he has been staying steady at roughly 212 pounds and is doing pretty well. He is using a sliding scale Lasix. His edema has been relatively well controlled with sliding scale Lasix dosing.  Besides his oxygen requiring COPD, he has not noted significant PND or orthopnea no symptoms.  He denies any chest tightness or pressure with rest or exertion. No irregular heartbeats, syncope/near syncope or TIA/amaurosis fugax; however, he has noted having some relatively fast heart rate did take a while to get back to norma   Past Medical History  Diagnosis Date  . Tobacco abuse     40 Pk-yr (1- 1 1/2 PPD) --> Quit 11/18/2012 when presented with STEMI  .  COPD (chronic obstructive pulmonary disease)   . STEMI of inferior wall 11/18/12- RCA BMS 11/17/2012    Cardiogenic shock - post MI with RV infarction [785.51]  . CAD S/P percutaneous coronary angioplasty  11/18/2012    - PCI mid RCA - 5.0 mm x 24 mm VeriFlex BMS (5.6 mm)  . Atrial fibrillation with RVR - peri-MI; recurrent after DCCV 11/19/2012  . Ischemic cardiomyopathy 11/19/2012    EF 40-45% with inferoseptal HK, Gr 2DD - high LVEDP/LAP. --> confirmed by f/u Echo 03/2013.  [name] Dyslipidemia- low HDL 11/19/2012  . Chronic combined systolic and diastolic heart failure, NYHA class 2 11/17/2012  . Colon polyp     Status post polypectomy x3.   ROS: A comprehensive was performed. Review of Systems  Constitutional: Negative for malaise/fatigue and diaphoresis.  HENT: Negative for congestion, nosebleeds and sore throat.   Respiratory: Positive for cough, sputum production, shortness of breath and wheezing. Negative for hemoptysis and stridor.   Cardiovascular: Negative for claudication.  Gastrointestinal: Negative for blood in stool and melena.       Rectus polypectomy, he did have mild blood in stools.  Genitourinary: Negative for frequency and hematuria.  Neurological: Positive for dizziness. Negative for tremors, sensory change, speech change, focal weakness, seizures and loss of consciousness.       Occasional positional dizziness and mild unsteady gait.  Endo/Heme/Allergies: Bruises/bleeds easily.  Psychiatric/Behavioral: Negative for depression. The patient is not nervous/anxious.   All other systems reviewed and are  negative.   Current Outpatient Prescriptions on File Prior to Visit  Medication Sig Dispense Refill  . acetaminophen (TYLENOL) 500 MG tablet Take by mouth every 6 (six) hours as needed for pain.      [name] albuterol (PROVENTIL HFA;VENTOLIN HFA) 108 (90 BASE) MCG/ACT inhaler Inhale 2 puffs into the lungs every 6 (six) hours as needed for wheezing.      [name] apixaban (ELIQUIS) 5 MG  TABS tablet Take 1 tablet (5 mg total) by mouth 2 (two) times daily.  60 tablet  11  . atorvastatin (LIPITOR) 40 MG tablet Take 1 tablet (40 mg total) by mouth daily.  30 tablet  11  . clopidogrel (PLAVIX) 75 MG tablet Take 75 mg by mouth daily.      [name] DALIRESP 500 MCG TABS tablet Take 500 mcg by mouth daily.       . furosemide (LASIX) 40 MG tablet Take 1 tablet (40 mg total) by mouth 2 (two) times daily.  60 tablet  11  . gabapentin (NEURONTIN) 300 MG capsule Take 600 mg by mouth 4 (four) times daily.      [name] levalbuterol (XOPENEX) 0.63 MG/3ML nebulizer solution Take 3 mLs (0.63 mg total) by nebulization 4 (four) times daily.  3 mL  12  . nitroGLYCERIN (NITROSTAT) 0.4 MG SL tablet Place 1 tablet (0.4 mg total) under the tongue every 5 (five) minutes x 3 doses as needed for chest pain.  25 tablet  2  . oxyCODONE (ROXICODONE) 15 MG immediate release tablet Take 15 mg by mouth every 4 (four) hours as needed for pain.      Donell Sievert IN Inhale into the lungs. Using 4 liters nasal cannula ,continuous      . potassium chloride (K-DUR) 10 MEQ tablet Take 10 mEq by mouth daily.      [name] SPIRIVA HANDIHALER 18 MCG inhalation capsule Place 18 mcg into inhaler and inhale daily.       . SYMBICORT 160-4.5 MCG/ACT inhaler Inhale 2 puffs into the lungs 2 (two) times daily.        No current facility-administered medications on file prior to visit.   ALLERGIES REVIEWED IN EPIC -- No change SOCIAL AND FAMILY HISTORY REVIEWED IN EPIC -- No change  Wt Readings from Last 3 Encounters:  11/24/13 218 lb (98.884 kg)  11/20/13 212 lb (96.163 kg)  11/20/13 212 lb (96.163 kg)    PHYSICAL EXAM BP 110/80  Pulse 100  Ht 5\' 11"  (1.803 m)  Wt 218 lb (98.884 kg)  BMI 30.42 kg/m2 General appearance: alert, cooperative, appears older than stated age, mild distress and somewhat ill-appearing; he isusing home oxygen and was breathing hard when walking into the clinic  Neck: moderate the elevated JVP; supple no LAD and  no carotid bruit.  Lungs: Diffuse rhonchorous wheezing most noted in the bases., As is his chronic finding.; no longer wet sounding. He has increased AP diameter with prolonged expiratory phase with expiratory wheezing.  Heart: RRR, S1, S2 normal, no murmur, click, rub or gallop and normal apical impulse  Abdomen: soft, non-tender; bowel sounds normal; no masses, no organomegaly  Extremities: extremities normal, atraumatic, no cyanosis; 1-2+ bilateral LE edema; Pulses: 2+ and symmetric  Neurologic: Grossly normal   Adult ECG Report  Rate: 100 ;  Rhythm: sinus tachycardia, left axis deviation, incomplete REB, inferior infarct - age undetermined.  Narrative Interpretation: Stable EKG  Recent Labs:    Lab Results  Component Value Date   CHOL 204* 06/30/2013  HDL 61 06/30/2013   LDLCALC 116* 06/30/2013   TRIG 134 06/30/2013   CHOLHDL 3.3 06/30/2013     Chemistry      Component Value Date/Time   NA 139 08/11/2013 0743   K 4.2 08/11/2013 0743   CL 100 08/11/2013 0743   CO2 31 08/11/2013 0743   BUN 26* 08/11/2013 0743   CREATININE 0.99 08/11/2013 0743   CREATININE 0.70 11/22/2012 0330      Component Value Date/Time   CALCIUM 9.0 08/11/2013 0743   ALKPHOS 53 06/30/2013 0816   AST 18 06/30/2013 0816   ALT 17 06/30/2013 0816   BILITOT 0.4 06/30/2013 0816     ASSESSMENT / PLAN: Chronic combined systolic and diastolic heart failure, NYHA class 2 These well the sliding scale Lasix. I talked about may use alternating taking a dose twice a day and the other days daily. Still a dry weight of, vitamin E is his preferable. He says that since he started feeling better he ate a little bit more and probably put all of weight from that which may be the reason why the baseline weight is increased. He is on an ARB as well diuretic. When his heart rate is fast as it is I think he should be on a beta blocker. We COPD we'll use a beta 1 selective agent such as bisoprolol and start low. -- 2.5 mg.  Cardiomyopathy, ischemic  - EF 40-45% No significant change despite this intervention. At least he is not in any targets for ICD recommendation. With the addition of beta blocker he will be on an adequate regimen.  CAD S/P percutaneous coronary angioplasty - PCI mid RCA - 5.0 mm x 24 mm VeriFlex BMS (5.6 mm) Status post large bare-metal stent. He still on Plavix probably prefer to aspirin plus Plavix. He is on ARB and statin for now he will be starting a beta blocker.  Paroxysmal Atrial fibrillation with RVR - peri-MI; recurrent after DCCV I don't believe that any recurrence of A. fib since being off amiodarone. He remained on ELIQUIS full anticoagulation(AC) and has not had any significant bleeding episodes. His CHA2DS2VASc score is quite high -- need chronic AC.  As his resting heart rate has increased, and starting a low-dose bisoprolol.  Dyslipidemia, goal LDL below 70 Unfortunately, his repeat the labs is again much higher than his pre-MI labs. His LDL is still over 100. He is on atorvastatin 1 mg. I will recheck at least one more time in December, and we will need to consider Zetia or switch to a stronger agent and atorvastatin. For now, add Co Q10 (up to 400 mg daily).    DOE (dyspnea on exertion) Multifactorial. No active signs of ongoing severe CHF, however as I thought dysfunction probably asked to adjust the one he is tachycardic as he is. Abdomen beta blocker will hopefully help reduce this problem. He is on chronic oxygen which is the best treatment for his condition.  COPD (chronic obstructive pulmonary disease) - Now on Home O2 She is now far enough out from being off of amiodarone.  Amiodarone was not a great choice for someone his level of COPD and oxygen requirement. He is on multiple inhalers and other therapies. Unlikely to be amiodarone toxicity at all.  Essential hypertension Blood pressure is great today on his current regimen. He may do better with the sotalol added with improved rate control as  well as antianxiety effect.    Orders Placed This Encounter  Procedures  .  Lipid panel    Standing Status: Future     Number of Occurrences:      Standing Expiration Date: 11/25/2014    Order Specific Question:  Has the patient fasted?    Answer:  Yes  . Comprehensive metabolic panel    Standing Status: Future     Number of Occurrences:      Standing Expiration Date: 11/25/2014    Order Specific Question:  Has the patient fasted?    Answer:  Yes  . EKG 12-Lead    DECREASE LOSARTAN TO 50 MG DAILY AND TAKE AT BEDTIME  START BISOPROLOL (ZEBTA) 2.5 MG ( 1/2 TABLET OF 5 MG TABLET) TAKE DAILY   IN THE MORNING  START CoQ10  Increase to 300 mg daily - add 100 mg every month until you reach 300 mg daily  Labs lipid , cmp in [facility] 2015  Your physician wants you to follow-up in 4-6 MONTHS DR Arlin Savona.- 30 MIN APPT.    Leonie Man, M.D., M.S. Interventional Cardiologist   Pager # [phone]

## 2013-11-24 NOTE — Patient Instructions (Signed)
DECREASE LOSARTAN TO 50 MG DAILY AND TAKE AT BEDTIME  START BISOPROLOL (ZEBTA) 2.5 MG ( 1/2 TABLET OF 5 MG TABLET) TAKE DAILY   IN THE MORNING  START CoQ10  Increase to 300 mg daily - add 100 mg every month until you reach 300 mg daily  Labs lipid , cmp in [facility] 2015  Your physician wants you to follow-up in 4-6 MONTHS DR HARDING.- 30 MIN APPT.  You will receive a reminder letter in the mail two months in advance. If you don't receive a letter, please call our office to schedule the follow-up appointment.

## 2013-11-26 ENCOUNTER — Encounter: Payer: Self-pay | Admitting: Cardiology

## 2013-11-26 DIAGNOSIS — I10 Essential (primary) hypertension: Secondary | ICD-10-CM | POA: Insufficient documentation

## 2013-11-26 NOTE — Assessment & Plan Note (Addendum)
Unfortunately, his repeat the labs is again much higher than his pre-MI labs. His LDL is still over 100. He is on atorvastatin 1 mg. I will recheck at least one more time in December, and we will need to consider Zetia or switch to a stronger agent and atorvastatin. For now, add Co Q10 (up to 400 mg daily).

## 2013-11-26 NOTE — Assessment & Plan Note (Signed)
Multifactorial. No active signs of ongoing severe CHF, however as I thought dysfunction probably asked to adjust the one he is tachycardic as he is. Abdomen beta blocker will hopefully help reduce this problem. He is on chronic oxygen which is the best treatment for his condition.

## 2013-11-26 NOTE — Assessment & Plan Note (Signed)
I don't believe that any recurrence of A. fib since being off amiodarone. He remained on ELIQUIS full anticoagulation(AC) and has not had any significant bleeding episodes. His CHA2DS2VASc score is quite high -- need chronic AC.  As his resting heart rate has increased, and starting a low-dose bisoprolol.

## 2013-11-26 NOTE — Assessment & Plan Note (Signed)
These well the sliding scale Lasix. I talked about may use alternating taking a dose twice a day and the other days daily. Still a dry weight of, vitamin E is his preferable. He says that since he started feeling better he ate a little bit more and probably put all of weight from that which may be the reason why the baseline weight is increased. He is on an ARB as well diuretic. When his heart rate is fast as it is I think he should be on a beta blocker. We COPD we'll use a beta 1 selective agent such as bisoprolol and start low. -- 2.5 mg.

## 2013-11-26 NOTE — Assessment & Plan Note (Signed)
No significant change despite this intervention. At least he is not in any targets for ICD recommendation. With the addition of beta blocker he will be on an adequate regimen.

## 2013-11-26 NOTE — Assessment & Plan Note (Addendum)
She is now far enough out from being off of amiodarone.  Amiodarone was not a great choice for someone his level of COPD and oxygen requirement. He is on multiple inhalers and other therapies. Unlikely to be amiodarone toxicity at all.

## 2013-11-26 NOTE — Assessment & Plan Note (Signed)
Blood pressure is great today on his current regimen. He may do better with the sotalol added with improved rate control as well as antianxiety effect.

## 2013-11-26 NOTE — Assessment & Plan Note (Signed)
Status post large bare-metal stent. He still on Plavix probably prefer to aspirin plus Plavix. He is on ARB and statin for now he will be starting a beta blocker.

## 2013-12-09 ENCOUNTER — Other Ambulatory Visit: Payer: Self-pay | Admitting: Cardiology

## 2013-12-09 MED ORDER — APIXABAN 5 MG PO TABS: 5.0000 mg | ORAL_TABLET | Freq: Two times a day (BID) | ORAL | Status: DC

## 2013-12-09 NOTE — Telephone Encounter (Signed)
Rx was sent to pharmacy electronically. 

## 2013-12-09 NOTE — Telephone Encounter (Signed)
Pt changed insurance. He needs you to call him a new prescription for Eliquis to [place]

## 2013-12-14 ENCOUNTER — Other Ambulatory Visit: Payer: Self-pay | Admitting: Pharmacist Clinician (PhC)/ Clinical Pharmacy Specialist

## 2013-12-14 MED ORDER — APIXABAN 5 MG PO TABS: 5.0000 mg | ORAL_TABLET | Freq: Two times a day (BID) | ORAL | Status: DC

## 2014-01-07 ENCOUNTER — Encounter (HOSPITAL_COMMUNITY): Payer: Self-pay | Admitting: Cardiology

## 2014-01-07 NOTE — Progress Notes (Signed)
Patient has graduated from [facility] program today 04/29/13 with 36 sessions.  He achieved LTG of 30 minutes of aerobic exercise at max met level of.  All patient vitals are WNL.  Patient has not met with dietician.  Discharge instructions have been reviewed in detail and patient expressed an understanding of material given.  Patient plans to exercise at home and possibly join the maintenance program. Cardiac Rehab will make 1 month, 6 month and 1 year call backs.  Patient had no complaints of any abnormal S/S or pain on their exit visit.  Patient did not complete the exit walk test.

## 2014-01-07 NOTE — Addendum Note (Signed)
Encounter addended by: Gean Maidens on: 01/07/2014  3:54 PM<BR>     Documentation filed: Notes Section

## 2014-01-25 ENCOUNTER — Telehealth: Payer: Self-pay | Admitting: Cardiology

## 2014-01-25 NOTE — Telephone Encounter (Signed)
Benjamin [name] called in stating that the Benjamin [name] is in the office to have labs done but she says there are no orders in the computer and the Benjamin [name] forgot his paperwork at home. Please call

## 2014-01-25 NOTE — Telephone Encounter (Signed)
Received a call from Benjamin [name] with [facility] needing a lab order.Patient there to have lab work.Order in for patient to have cmp,lipid panel.

## 2014-01-26 ENCOUNTER — Other Ambulatory Visit: Payer: Self-pay | Admitting: Cardiology

## 2014-01-26 LAB — COMPREHENSIVE METABOLIC PANEL
ALT: 27 U/L (ref 0–53)
AST: 18 U/L (ref 0–37)
Albumin: 4.2 g/dL (ref 3.5–5.2)
Alkaline Phosphatase: 83 U/L (ref 39–117)
BUN: 18 mg/dL (ref 6–23)
CO2: 35 mEq/L — ABNORMAL HIGH (ref 19–32)
Calcium: 9.3 mg/dL (ref 8.4–10.5)
Chloride: 102 mEq/L (ref 96–112)
Creat: 0.89 mg/dL (ref 0.50–1.35)
Glucose, Bld: 98 mg/dL (ref 70–99)
Potassium: 4.7 mEq/L (ref 3.5–5.3)
Sodium: 146 mEq/L — ABNORMAL HIGH (ref 135–145)
Total Bilirubin: 0.5 mg/dL (ref 0.2–1.2)
Total Protein: 6.9 g/dL (ref 6.0–8.3)

## 2014-01-26 LAB — LIPID PANEL
CHOLESTEROL: 174 mg/dL (ref 0–200)
HDL: 56 mg/dL (ref >39)
LDL CALC: 86 mg/dL (ref 0–99)
TRIGLYCERIDES: 162 mg/dL — AB (ref <150)
Total CHOL/HDL Ratio: 3.1 Ratio
VLDL: 32 mg/dL (ref 0–40)

## 2014-02-03 ENCOUNTER — Telehealth: Payer: Self-pay | Admitting: *Deleted

## 2014-02-03 NOTE — Telephone Encounter (Signed)
-----   Message from Leonie Man, MD sent at 02/02/2014  9:17 PM EST ----- Cholesterol is improved significantly - still not yet at goal, but much better LDL goal of 70 or 86, HDL is well within target range, and triglycerides are just up a bit.  Chemistries are all relatively normal.  HARDING, Leonie Green, MD

## 2014-02-03 NOTE — Telephone Encounter (Signed)
Spoke to patient. Result given . Verbalized understanding Patient request a copy to primary Dr Gerarda Fraction. RN routed to primary

## 2014-02-16 ENCOUNTER — Other Ambulatory Visit: Payer: Self-pay | Admitting: Pharmacist Clinician (PhC)/ Clinical Pharmacy Specialist

## 2014-02-16 MED ORDER — APIXABAN 5 MG PO TABS: 5.0000 mg | ORAL_TABLET | Freq: Two times a day (BID) | ORAL | Status: DC

## 2014-02-17 ENCOUNTER — Telehealth: Payer: Self-pay | Admitting: Cardiology

## 2014-02-17 MED ORDER — APIXABAN 5 MG PO TABS: 5.0000 mg | ORAL_TABLET | Freq: Two times a day (BID) | ORAL | Status: DC

## 2014-02-17 NOTE — Telephone Encounter (Signed)
Rx(s) sent to pharmacy electronically.  

## 2014-02-17 NOTE — Telephone Encounter (Signed)
Pt need a new prescription for his Eliqius. Please call to [facility] [phone].

## 2014-02-18 ENCOUNTER — Telehealth: Payer: Self-pay | Admitting: Cardiology

## 2014-02-18 MED ORDER — APIXABAN 5 MG PO TABS: 5.0000 mg | ORAL_TABLET | Freq: Two times a day (BID) | ORAL | Status: DC

## 2014-02-18 NOTE — Telephone Encounter (Signed)
°  1. Which medications need to be refilled? Eliquis  2. Which pharmacy is medication to be sent to? [facility]  3. Do they need a 30 day or 90 day supply? 30  4. Would they like a call back once the medication has been sent to the pharmacy? yes

## 2014-02-18 NOTE — Telephone Encounter (Signed)
Pt informed rx refilled, did call in to pharmacy due to non-receipt of electronic delivery.

## 2014-02-24 DIAGNOSIS — J449 Chronic obstructive pulmonary disease, unspecified: Secondary | ICD-10-CM | POA: Diagnosis not present

## 2014-03-05 ENCOUNTER — Telehealth: Payer: Self-pay | Admitting: Cardiology

## 2014-03-05 ENCOUNTER — Other Ambulatory Visit: Payer: Self-pay | Admitting: Cardiology

## 2014-03-05 MED ORDER — APIXABAN 5 MG PO TABS: 5.0000 mg | ORAL_TABLET | Freq: Two times a day (BID) | ORAL | Status: DC

## 2014-03-05 NOTE — Telephone Encounter (Signed)
Updated Environmental consultant. Will contact [facility] today.

## 2014-03-05 NOTE — Telephone Encounter (Signed)
Mr. Huser is calling because the insurance company states he needs a prior auth for his Eliquis . Please call if you have any questions   Thanks

## 2014-03-05 NOTE — Telephone Encounter (Signed)
Received call from [place] with [employer] [facility] patient does not have a artificial heart valve.Eliquis approved for 1 year.

## 2014-03-05 NOTE — Telephone Encounter (Signed)
Prior Auth submitted

## 2014-03-05 NOTE — Telephone Encounter (Signed)
Judson Roch is calling in because she needs a prior authorization for the pt's Eliquis . She sees that the pt has AFib but she would like to  Know if he has a artifical heart valve.

## 2014-03-10 DIAGNOSIS — G894 Chronic pain syndrome: Secondary | ICD-10-CM | POA: Diagnosis not present

## 2014-03-10 DIAGNOSIS — J449 Chronic obstructive pulmonary disease, unspecified: Secondary | ICD-10-CM | POA: Diagnosis not present

## 2014-03-10 DIAGNOSIS — Z0001 Encounter for general adult medical examination with abnormal findings: Secondary | ICD-10-CM | POA: Diagnosis not present

## 2014-03-10 DIAGNOSIS — Z6832 Body mass index (BMI) 32.0-32.9, adult: Secondary | ICD-10-CM | POA: Diagnosis not present

## 2014-03-26 DIAGNOSIS — I2510 Atherosclerotic heart disease of native coronary artery without angina pectoris: Secondary | ICD-10-CM | POA: Diagnosis not present

## 2014-03-26 DIAGNOSIS — J449 Chronic obstructive pulmonary disease, unspecified: Secondary | ICD-10-CM | POA: Diagnosis not present

## 2014-03-26 DIAGNOSIS — I110 Hypertensive heart disease with heart failure: Secondary | ICD-10-CM | POA: Diagnosis not present

## 2014-03-26 DIAGNOSIS — J42 Unspecified chronic bronchitis: Secondary | ICD-10-CM | POA: Diagnosis not present

## 2014-03-27 DIAGNOSIS — J449 Chronic obstructive pulmonary disease, unspecified: Secondary | ICD-10-CM | POA: Diagnosis not present

## 2014-03-29 ENCOUNTER — Ambulatory Visit (INDEPENDENT_AMBULATORY_CARE_PROVIDER_SITE_OTHER): Payer: Medicare Other | Admitting: Cardiology

## 2014-03-29 ENCOUNTER — Encounter: Payer: Self-pay | Admitting: Cardiology

## 2014-03-29 VITALS — HR 63 | Ht 71.0 in | Wt 235.2 lb

## 2014-03-29 DIAGNOSIS — E669 Obesity, unspecified: Secondary | ICD-10-CM

## 2014-03-29 DIAGNOSIS — R0609 Other forms of dyspnea: Secondary | ICD-10-CM | POA: Diagnosis not present

## 2014-03-29 DIAGNOSIS — I10 Essential (primary) hypertension: Secondary | ICD-10-CM | POA: Diagnosis not present

## 2014-03-29 DIAGNOSIS — I2119 ST elevation (STEMI) myocardial infarction involving other coronary artery of inferior wall: Secondary | ICD-10-CM

## 2014-03-29 DIAGNOSIS — I2510 Atherosclerotic heart disease of native coronary artery without angina pectoris: Secondary | ICD-10-CM

## 2014-03-29 DIAGNOSIS — I255 Ischemic cardiomyopathy: Secondary | ICD-10-CM

## 2014-03-29 DIAGNOSIS — Z79899 Other long term (current) drug therapy: Secondary | ICD-10-CM

## 2014-03-29 DIAGNOSIS — J449 Chronic obstructive pulmonary disease, unspecified: Secondary | ICD-10-CM

## 2014-03-29 DIAGNOSIS — I480 Paroxysmal atrial fibrillation: Secondary | ICD-10-CM

## 2014-03-29 DIAGNOSIS — E785 Hyperlipidemia, unspecified: Secondary | ICD-10-CM | POA: Diagnosis not present

## 2014-03-29 DIAGNOSIS — Z9861 Coronary angioplasty status: Secondary | ICD-10-CM

## 2014-03-29 DIAGNOSIS — I5042 Chronic combined systolic (congestive) and diastolic (congestive) heart failure: Secondary | ICD-10-CM

## 2014-03-29 NOTE — Patient Instructions (Addendum)
  Your physician encouraged you to lose weight for better health. Labs - cmp,lipid in July 2016-- will mail lab slip.  If you start steroids double the dose of LASIX - WHILE YOU ARE ON THE STERIODS.   Your physician wants you to follow-up in  4-6 MONTH DR HARDING ---30 min appointment.  You will receive a reminder letter in the mail two months in advance. If you don't receive a letter, please call our office to schedule the follow-up appointment.

## 2014-03-30 ENCOUNTER — Encounter: Payer: Self-pay | Admitting: Cardiology

## 2014-03-30 NOTE — Assessment & Plan Note (Signed)
I am concerned that he may be developing an acute exacerbation. I have referred him back to PCP.

## 2014-03-30 NOTE — Assessment & Plan Note (Addendum)
Stable without active anginal symptoms. Despite PCI with a large bare metal stent, with no adverse effect from being on Plavix I would continue Plavix without aspirin for secondary prevention. He is on a stable dose of beta blocker, and ARB. Also on statin.

## 2014-03-30 NOTE — Assessment & Plan Note (Signed)
At present, this is probably more COPD than his CAD/cardiomyopathy. He is not having any angina symptoms. No PND or orthopnea to suspect volume overload from systolic or diastolic heart failure. Certainly his weight has not helped things out, but the increased wheezing is more concerning. I have asked that he contact his PCP or pulmonologist to see if they would start him on steroids or new inhalers. I did suggest that he should increase his Lasix while on steroids if they are used. When taking increased Lasix he should increase his potassium supplementation as well.

## 2014-03-30 NOTE — Progress Notes (Signed)
PCP: [name], MD  Clinic Note: Chief Complaint  Patient presents with  . 4 month visit    sob - "walking take a lot out of me"- wheezing( 4/l  liter oxygen), no chest pain , no edema    HPI: Benjamin [name] is a 59 y.o. male with a PMH below who presents today for three-month followup for his CAD he has chronic ischemic cardiomyopathy and chronic combined systolic/ diastolic heart failure along with long-standing COPD from a 40-pack-year smoking history. He is on home oxygen at 4 L. He had an inferior study in October of 2014 with very large RCA was occluded and old thrombus. This was treated with 5 mm x 24 mm VeriFlex BMS post dilated about 5.5 mm. It was relatively difficult post MI hospital course and cardiac shock and atrial fibrillation that required cardioversion. He really hasn't had much of anything since then. He has a moderate ischemic EF of roughly 40-45% it was roughly stable on followup echo. He is inferoseptal hypokinesis, and also evidence of high filling pressures. His initial dry we post hospital stay was 212 pounds. He was doing well with sliding scale Lasix.  Unfortunately, over last few months he has gained a significant amount of weight and is up to 235 pounds now. He has been noticing more wheezing and worsening dyspnea over last week or 2. He's also been coughing some no fevers or chills but has been having a significant increase in coughing and wheezing. He had any orthopnea or PND. Has not had any edema. He notes that he is significantly short of breath she simply walking in from the waiting area to the clinic room. This is noticeable worsening of symptoms, but no associated anginal chest tightness or pressure with rest or exertion. Nothing to suggest any heart failure is noted above. He denies any rapid or irregular heartbeat/palpitations just occasional "flip flops". Nothing symptomatic. No syncope/near syncope or TIA /amaurosis fugax.    Past Medical History   Diagnosis Date  . Tobacco abuse     40 Pk-yr (1- 1 1/2 PPD) --> Quit 11/18/2012 when presented with STEMI  . COPD (chronic obstructive pulmonary disease)   . STEMI of inferior wall 11/18/12- RCA BMS 11/17/2012    Cardiogenic shock - post MI with RV infarction [785.51]  . CAD S/P percutaneous coronary angioplasty  11/18/2012    - PCI mid RCA - 5.0 mm x 24 mm VeriFlex BMS (5.6 mm)  . Atrial fibrillation with RVR - peri-MI; recurrent after DCCV 11/19/2012  . Ischemic cardiomyopathy 11/19/2012    EF 40-45% with inferoseptal HK, Gr 2DD - high LVEDP/LAP. --> confirmed by f/u Echo 03/2013.  [name] Dyslipidemia- low HDL 11/19/2012  . Chronic combined systolic and diastolic heart failure, NYHA class 2 11/17/2012  . Colon polyp     Status post polypectomy x3.   ROS: A comprehensive was performed. Review of Systems  Constitutional: Negative for fever and chills.  HENT: Negative for nosebleeds.   Respiratory: Positive for cough, sputum production, shortness of breath and wheezing. Negative for hemoptysis.        Overall symptoms seem to have been worse over the last few weeks.  Cardiovascular: Negative for chest pain, palpitations, orthopnea (At), leg swelling (Very rare) and PND.  Gastrointestinal: Negative for blood in stool and melena.  Genitourinary: Negative for hematuria.  Musculoskeletal: Positive for back pain.  Neurological: Negative for dizziness and headaches.  Endo/Heme/Allergies: Bruises/bleeds easily.  Psychiatric/Behavioral: Positive for depression.  All other systems  reviewed and are negative.   Current Outpatient Prescriptions on File Prior to Visit  Medication Sig Dispense Refill  . acetaminophen (TYLENOL) 500 MG tablet Take by mouth every 6 (six) hours as needed for pain.    [name] albuterol (PROVENTIL HFA;VENTOLIN HFA) 108 (90 BASE) MCG/ACT inhaler Inhale 2 puffs into the lungs every 6 (six) hours as needed for wheezing.    [name] apixaban (ELIQUIS) 5 MG TABS tablet Take 1 tablet (5 mg  total) by mouth 2 (two) times daily. 60 tablet 6  . atorvastatin (LIPITOR) 40 MG tablet Take 1 tablet (40 mg total) by mouth daily. 30 tablet 11  . bisoprolol (ZEBETA) 5 MG tablet Take 0.5 tablets (2.5 mg total) by mouth daily. 15 tablet 6  . clopidogrel (PLAVIX) 75 MG tablet Take 75 mg by mouth daily.    [name] DALIRESP 500 MCG TABS tablet Take 500 mcg by mouth daily.     . fluticasone (FLONASE) 50 MCG/ACT nasal spray Place 1 spray into both nostrils daily.    . furosemide (LASIX) 40 MG tablet Take 1 tablet (40 mg total) by mouth 2 (two) times daily. 60 tablet 11  . gabapentin (NEURONTIN) 300 MG capsule Take 600 mg by mouth 4 (four) times daily.    [name] HYDROcodone-acetaminophen (NORCO/VICODIN) 5-325 MG per tablet Take by mouth every 6 (six) hours as needed.     [name] ipratropium (ATROVENT) 0.06 % nasal spray 1 spray 3 (three) times daily.     [name] levalbuterol (XOPENEX) 0.63 MG/3ML nebulizer solution Take 3 mLs (0.63 mg total) by nebulization 4 (four) times daily. 3 mL 12  . losartan (COZAAR) 50 MG tablet Take 1 tablet (50 mg total) by mouth daily. 30 tablet 6  . nitroGLYCERIN (NITROSTAT) 0.4 MG SL tablet Place 1 tablet (0.4 mg total) under the tongue every 5 (five) minutes x 3 doses as needed for chest pain. 25 tablet 2  . OVER THE COUNTER MEDICATION Take by mouth daily. SOLUTION RX - FOR HIGH BLOOD PRESSURE SUPPORT- B 12 and multi vitamin    . OXYGEN-HELIUM IN Inhale into the lungs. Using 4 liters nasal cannula ,continuous    . potassium chloride (K-DUR) 10 MEQ tablet Take 10 mEq by mouth daily.    [name] SPIRIVA HANDIHALER 18 MCG inhalation capsule Place 18 mcg into inhaler and inhale daily.     . SYMBICORT 160-4.5 MCG/ACT inhaler Inhale 2 puffs into the lungs 2 (two) times daily.      No current facility-administered medications on file prior to visit.   Allergies  Allergen Reactions  . Codeine     Red blotches  . Penicillins     Childhood reaction   SOCIAL AND FAMILY HISTORY REVIEWED IN EPIC -- No  change  Wt Readings from Last 3 Encounters:  03/29/14 235 lb 3.2 oz (106.686 kg)  11/24/13 218 lb (98.884 kg)  11/20/13 212 lb (96.163 kg)  *  PHYSICAL EXAM Pulse 63  Ht 5\' 11"  (1.803 m)  Wt 235 lb 3.2 oz (106.686 kg)  BMI 32.82 kg/m2 General appearance: alert, cooperative, appears older than stated age, chronically ill appearing in mild distress. More noticeable increased work of breathing.; on home oxygen and was breathing hard when walking into the clinic  Neck: mildlythe elevated JVP; supple no LAD and no carotid bruit.  Lungs: Diffuse rhonchorous wheezing most noted in the bases., ; no longer wet sounding. He has increased AP diameter with prolonged expiratory phase with expiratory wheezing.  Heart: RRR, S1, S2 normal,  no murmur, click, rub or gallop and normal apical impulse  Abdomen: soft, non-tender; bowel sounds normal; no masses, no organomegaly  Extremities: extremities normal, atraumatic, no cyanosis; 1-2+ bilateral LE edema; Pulses: 2+ and symmetric  Neurologic: Grossly normal   Adult ECG Report  Rate: 63;  Rhythm: normal sinus rhythm, normal axis, incomplete RBBB, CRO inferior infarct - age undetermined.  Narrative Interpretation: Stable EKG  Recent Labs:    Lab Results  Component Value Date   CHOL 174 01/26/2014   HDL 56 01/26/2014   LDLCALC 86 01/26/2014   TRIG 162* 01/26/2014   CHOLHDL 3.1 01/26/2014     Chemistry      Component Value Date/Time   NA 146* 01/26/2014 0838   K 4.7 01/26/2014 0838   CL 102 01/26/2014 0838   CO2 35* 01/26/2014 0838   BUN 18 01/26/2014 0838   CREATININE 0.89 01/26/2014 0838   CREATININE 0.70 11/22/2012 0330      Component Value Date/Time   CALCIUM 9.3 01/26/2014 0838   ALKPHOS 83 01/26/2014 0838   AST 18 01/26/2014 0838   ALT 27 01/26/2014 0838   BILITOT 0.5 01/26/2014 0838     ASSESSMENT / PLAN: DOE (dyspnea on exertion) At present, this is probably more COPD than his CAD/cardiomyopathy. He is not having any angina  symptoms. No PND or orthopnea to suspect volume overload from systolic or diastolic heart failure. Certainly his weight has not helped things out, but the increased wheezing is more concerning. I have asked that he contact his PCP or pulmonologist to see if they would start him on steroids or new inhalers. I did suggest that he should increase his Lasix while on steroids if they are used. When taking increased Lasix he should increase his potassium supplementation as well.   COPD (chronic obstructive pulmonary disease) - Now on Home O2 I am concerned that he may be developing an acute exacerbation. I have referred him back to PCP.   STEMI of inferior wall 11/18/12- RCA BMS Sizable infarct as indicated by regional wall motion abnormality on echocardiogram. He had a delayed presentation. No longer has any complaints of symptoms to consider recurrence of angina. No heart failure symptoms despite mildly reduced EF.   CAD S/P percutaneous coronary angioplasty - PCI mid RCA - 5.0 mm x 24 mm VeriFlex BMS (5.6 mm) Stable without active anginal symptoms. Despite PCI with a large bare metal stent, with no adverse effect from being on Plavix I would continue Plavix without aspirin for secondary prevention. He is on a stable dose of beta blocker, and ARB. Also on statin.   Chronic combined systolic and diastolic heart failure, NYHA class 2 Moderately reduced EF with diastolic dysfunction. No active heart failure symptoms of PND, orthopnea or edema. On beta blocker, ARB and standing Lasix dose with sliding scale recommendation.   Paroxysmal Atrial fibrillation with RVR - peri-MI; recurrent after DCCV As far as tell. No recurrence. Based on his high CHA2DS2Vasc Score - on Eliquis for full anticoagulation. On low-dose (beta 1 selective) beta blocker for rate control.   Essential hypertension Well-controlled on current regimen. No planned changes.   Dyslipidemia, goal LDL below 70 Notable  improvement of lipids. We'll recheck in June. Continue current dose of atorvastatin. May need to consider adding Zetia. Continue co-Q10 No cramping or myalgias.   Cardiomyopathy, ischemic - EF 40-45% See above. No improvement in the EF despite revascularization.   Obesity (BMI 30.0-34.9) Notable weight gain over the last several months. He  really is not good at monitoring his diet. Rediscuss significant adjustments to dietary intake. Cutting back processed foods and possibly his last friends and Little Debbie cakes. I really don't think that he will be able to do much in the way of exercise, so really the dietary adjustment is most important. I also think he may benefit from nutrition counseling.  His daughter would be someone who should go along with him for counseling.     Orders Placed This Encounter  Procedures  . Lipid panel    Standing Status: Future     Number of Occurrences:      Standing Expiration Date: 03/29/2015    Order Specific Question:  Has the patient fasted?    Answer:  Yes  . Comprehensive metabolic panel    Standing Status: Future     Number of Occurrences:      Standing Expiration Date: 03/29/2015    Order Specific Question:  Has the patient fasted?    Answer:  Yes  . EKG 12-Lead   Followup in 4-6 months  Makylee Sanborn, Leonie Green, M.D., M.S. Interventional Cardiologist   Pager # [phone]

## 2014-03-30 NOTE — Assessment & Plan Note (Signed)
Sizable infarct as indicated by regional wall motion abnormality on echocardiogram. He had a delayed presentation. No longer has any complaints of symptoms to consider recurrence of angina. No heart failure symptoms despite mildly reduced EF.

## 2014-03-31 DIAGNOSIS — E669 Obesity, unspecified: Secondary | ICD-10-CM | POA: Insufficient documentation

## 2014-03-31 DIAGNOSIS — E66811 Obesity, class 1: Secondary | ICD-10-CM | POA: Insufficient documentation

## 2014-03-31 NOTE — Assessment & Plan Note (Signed)
As far as tell. No recurrence. Based on his high CHA2DS2Vasc Score - on Eliquis for full anticoagulation. On low-dose (beta 1 selective) beta blocker for rate control.

## 2014-03-31 NOTE — Assessment & Plan Note (Signed)
Moderately reduced EF with diastolic dysfunction. No active heart failure symptoms of PND, orthopnea or edema. On beta blocker, ARB and standing Lasix dose with sliding scale recommendation.

## 2014-03-31 NOTE — Assessment & Plan Note (Signed)
Notable improvement of lipids. We'll recheck in June. Continue current dose of atorvastatin. May need to consider adding Zetia. Continue co-Q10 No cramping or myalgias.

## 2014-03-31 NOTE — Assessment & Plan Note (Signed)
Well-controlled on current regimen. No planned changes.

## 2014-03-31 NOTE — Assessment & Plan Note (Signed)
See above. No improvement in the EF despite revascularization.

## 2014-03-31 NOTE — Assessment & Plan Note (Addendum)
Notable weight gain over the last several months. He really is not good at monitoring his diet. Rediscuss significant adjustments to dietary intake. Cutting back processed foods and possibly his last friends and Little Debbie cakes. I really don't think that he will be able to do much in the way of exercise, so really the dietary adjustment is most important. I also think he may benefit from nutrition counseling.  His daughter would be someone who should go along with him for counseling.

## 2014-04-12 ENCOUNTER — Other Ambulatory Visit: Payer: Self-pay | Admitting: Cardiovascular Disease

## 2014-04-12 NOTE — Telephone Encounter (Signed)
Rx refill sent to patient pharmacy   

## 2014-04-25 DIAGNOSIS — J449 Chronic obstructive pulmonary disease, unspecified: Secondary | ICD-10-CM | POA: Diagnosis not present

## 2014-05-26 DIAGNOSIS — J449 Chronic obstructive pulmonary disease, unspecified: Secondary | ICD-10-CM | POA: Diagnosis not present

## 2014-06-10 ENCOUNTER — Other Ambulatory Visit: Payer: Self-pay | Admitting: Cardiology

## 2014-06-10 ENCOUNTER — Other Ambulatory Visit: Payer: Self-pay | Admitting: *Deleted

## 2014-06-10 DIAGNOSIS — E785 Hyperlipidemia, unspecified: Secondary | ICD-10-CM

## 2014-06-10 DIAGNOSIS — I5042 Chronic combined systolic (congestive) and diastolic (congestive) heart failure: Secondary | ICD-10-CM

## 2014-06-10 DIAGNOSIS — R0609 Other forms of dyspnea: Secondary | ICD-10-CM

## 2014-06-10 DIAGNOSIS — I255 Ischemic cardiomyopathy: Secondary | ICD-10-CM

## 2014-06-10 MED ORDER — LOSARTAN POTASSIUM 50 MG PO TABS: 50.0000 mg | ORAL_TABLET | Freq: Every day | ORAL | Status: DC

## 2014-06-10 MED ORDER — BISOPROLOL FUMARATE 5 MG PO TABS: 2.5000 mg | ORAL_TABLET | Freq: Every day | ORAL | Status: DC

## 2014-06-10 MED ORDER — ATORVASTATIN CALCIUM 40 MG PO TABS: 40.0000 mg | ORAL_TABLET | Freq: Every day | ORAL | Status: DC

## 2014-06-11 ENCOUNTER — Other Ambulatory Visit: Payer: Self-pay | Admitting: *Deleted

## 2014-06-11 NOTE — Telephone Encounter (Signed)
Losartan, bisoprolol, atorvastatin refilled 06/10/14

## 2014-06-24 DIAGNOSIS — J449 Chronic obstructive pulmonary disease, unspecified: Secondary | ICD-10-CM | POA: Diagnosis not present

## 2014-06-24 DIAGNOSIS — Z6832 Body mass index (BMI) 32.0-32.9, adult: Secondary | ICD-10-CM | POA: Diagnosis not present

## 2014-06-24 DIAGNOSIS — J0100 Acute maxillary sinusitis, unspecified: Secondary | ICD-10-CM | POA: Diagnosis not present

## 2014-06-24 DIAGNOSIS — E6609 Other obesity due to excess calories: Secondary | ICD-10-CM | POA: Diagnosis not present

## 2014-06-24 DIAGNOSIS — J209 Acute bronchitis, unspecified: Secondary | ICD-10-CM | POA: Diagnosis not present

## 2014-06-24 DIAGNOSIS — I10 Essential (primary) hypertension: Secondary | ICD-10-CM | POA: Diagnosis not present

## 2014-06-25 DIAGNOSIS — J449 Chronic obstructive pulmonary disease, unspecified: Secondary | ICD-10-CM | POA: Diagnosis not present

## 2014-07-01 ENCOUNTER — Telehealth: Payer: Self-pay | Admitting: *Deleted

## 2014-07-01 DIAGNOSIS — I480 Paroxysmal atrial fibrillation: Secondary | ICD-10-CM

## 2014-07-01 DIAGNOSIS — I5042 Chronic combined systolic (congestive) and diastolic (congestive) heart failure: Secondary | ICD-10-CM

## 2014-07-01 DIAGNOSIS — R0609 Other forms of dyspnea: Secondary | ICD-10-CM

## 2014-07-01 DIAGNOSIS — Z79899 Other long term (current) drug therapy: Secondary | ICD-10-CM

## 2014-07-01 DIAGNOSIS — I10 Essential (primary) hypertension: Secondary | ICD-10-CM

## 2014-07-01 DIAGNOSIS — E785 Hyperlipidemia, unspecified: Secondary | ICD-10-CM

## 2014-07-01 NOTE — Telephone Encounter (Signed)
-----   Message from Raiford Simmonds, RN sent at 03/29/2014 10:33 AM EST ----- LABS CMP,LIPID IN July 2016 [facility] IN June 2016

## 2014-07-01 NOTE — Telephone Encounter (Signed)
Mail letter and lab slip cmp,lipid due 08/24/14

## 2014-07-06 ENCOUNTER — Encounter: Payer: Self-pay | Admitting: Cardiology

## 2014-07-06 ENCOUNTER — Ambulatory Visit: Payer: Medicare Other | Admitting: Cardiology

## 2014-07-06 ENCOUNTER — Ambulatory Visit (INDEPENDENT_AMBULATORY_CARE_PROVIDER_SITE_OTHER): Payer: Medicare Other | Admitting: Cardiology

## 2014-07-06 VITALS — BP 124/84 | HR 67 | Ht 71.0 in | Wt 230.8 lb

## 2014-07-06 DIAGNOSIS — I255 Ischemic cardiomyopathy: Secondary | ICD-10-CM | POA: Diagnosis not present

## 2014-07-06 DIAGNOSIS — I5042 Chronic combined systolic (congestive) and diastolic (congestive) heart failure: Secondary | ICD-10-CM

## 2014-07-06 DIAGNOSIS — E785 Hyperlipidemia, unspecified: Secondary | ICD-10-CM

## 2014-07-06 DIAGNOSIS — R0609 Other forms of dyspnea: Secondary | ICD-10-CM

## 2014-07-06 DIAGNOSIS — J449 Chronic obstructive pulmonary disease, unspecified: Secondary | ICD-10-CM | POA: Diagnosis not present

## 2014-07-06 DIAGNOSIS — Z87891 Personal history of nicotine dependence: Secondary | ICD-10-CM

## 2014-07-06 DIAGNOSIS — Z9861 Coronary angioplasty status: Secondary | ICD-10-CM

## 2014-07-06 DIAGNOSIS — I10 Essential (primary) hypertension: Secondary | ICD-10-CM

## 2014-07-06 DIAGNOSIS — I480 Paroxysmal atrial fibrillation: Secondary | ICD-10-CM

## 2014-07-06 DIAGNOSIS — I2510 Atherosclerotic heart disease of native coronary artery without angina pectoris: Secondary | ICD-10-CM | POA: Diagnosis not present

## 2014-07-06 NOTE — Patient Instructions (Signed)
NO CHANGE IN MEDICATIONS   Your physician wants you to follow-up in [place].  You will receive a reminder letter in the mail two months in advance. If you don't receive a letter, please call our office to schedule the follow-up appointment.

## 2014-07-06 NOTE — Progress Notes (Signed)
PCP: [name], MD  Clinic Note: Chief Complaint  Patient presents with  . Follow-up    4 month follow up, no chest discomfort or pain, swelling of legs. no other concerns  . Coronary Artery Disease    With ischemic cardiomyopathy. Status post antrectomy    HPI: Benjamin [name] is a 59 y.o. male with a PMH below who presents today for three-month followup for his CAD he has chronic ischemic cardiomyopathy and chronic combined systolic/ diastolic heart failure along with long-standing COPD from a 40-pack-year smoking history. He is on home oxygen at 4 L. He had an inferior study in October of 2014 with very large RCA was occluded and old thrombus. This was treated with 5 mm x 24 mm VeriFlex BMS post dilated about 5.5 mm. It was relatively difficult post MI hospital course and cardiac shock and atrial fibrillation that required cardioversion. He really hasn't had much of anything since then. He has a moderate ischemic EF of roughly 40-45% it was roughly stable on followup echo.  His initial dry we post hospital stay was 212 pounds -- however symptomatically it would seem like his current weight is 230 pounds..Unfortunately, over last few months he has gained a significant amount of weight and is up to 235 pounds during his last visit. I saw him in the end of February, and since then we have been titrating his diuretics up and down.   He was doing well with sliding scale Lasix, but got somewhat out of sorts with his dyspnea. He called the clinic and we titrated up his doses. He has lost about 5 pounds but his complex of the. Despite that he is noticing that he feels better, and his lower extremity edema is better his well. It also didn't probably dealing with upper respiratory tract infection and COPD exacerbations. Since then his wheezing and coughing etc. Has notably improved. No recurrence of significant productive coughing. No rapid or irregular heartbeats. Overall he says he feels  like he is doing quite well and his close to her baseline as he can be. He works on his treadmill as much he can pick his nose he gets more short of breath with less activity than he had in the past but denies any chest pressure associated with it. He doesn't delayed recovery as well.  Cardiovascular ROS: positive for - dyspnea on exertion, irregular heartbeat and Improving orthopnea but no PND. Edema is notably also improved. He does have increased abdominal girth but is not sure this is not his baseline. negative for - chest pain, irregular heartbeat, paroxysmal nocturnal dyspnea or shortness of breath   Past Medical History  Diagnosis Date  . Tobacco abuse     40 Pk-yr (1- 1 1/2 PPD) --> Quit 11/18/2012 when presented with STEMI  . COPD (chronic obstructive pulmonary disease)   . STEMI of inferior wall 11/18/12- RCA BMS 11/17/2012    Cardiogenic shock - post MI with RV infarction [785.51]  . CAD S/P percutaneous coronary angioplasty  11/18/2012    - PCI mid RCA - 5.0 mm x 24 mm VeriFlex BMS (5.6 mm)  . Atrial fibrillation with RVR - peri-MI; recurrent after DCCV 11/19/2012  . Ischemic cardiomyopathy 11/19/2012    EF 40-45% with inferoseptal HK, Gr 2DD - high LVEDP/LAP. --> confirmed by f/u Echo 03/2013.  [name] Dyslipidemia- low HDL 11/19/2012  . Chronic combined systolic and diastolic heart failure, NYHA class 2 11/17/2012  . Colon polyp     Status  post polypectomy x3.   ROS: A comprehensive was performed. Review of Systems  Constitutional: Negative for malaise/fatigue.  Respiratory: Positive for sputum production (Am cough -- just finished Abx), shortness of breath and wheezing (No more than usual).   Cardiovascular: Negative for chest pain, palpitations, orthopnea, claudication, leg swelling and PND.  Gastrointestinal: Negative for blood in stool and melena.  Genitourinary: Negative for dysuria and hematuria.  Musculoskeletal: Positive for back pain (occasional  - nothing major).  Negative for falls.       Muscle spasms once & a while  Neurological: Negative for dizziness, sensory change, seizures, loss of consciousness and weakness.  Endo/Heme/Allergies: Bruises/bleeds easily (mild).  Psychiatric/Behavioral: The patient has insomnia (Sleeping better.).   All other systems reviewed and are negative.   Current Outpatient Prescriptions on File Prior to Visit  Medication Sig Dispense Refill  . acetaminophen (TYLENOL) 500 MG tablet Take by mouth every 6 (six) hours as needed for pain.    [name] albuterol (PROVENTIL HFA;VENTOLIN HFA) 108 (90 BASE) MCG/ACT inhaler Inhale 2 puffs into the lungs every 6 (six) hours as needed for wheezing.    [name] apixaban (ELIQUIS) 5 MG TABS tablet Take 1 tablet (5 mg total) by mouth 2 (two) times daily. 60 tablet 6  . atorvastatin (LIPITOR) 40 MG tablet Take 1 tablet (40 mg total) by mouth daily. 30 tablet 11  . bisoprolol (ZEBETA) 5 MG tablet Take 0.5 tablets (2.5 mg total) by mouth daily. 15 tablet 11  . clopidogrel (PLAVIX) 75 MG tablet TAKE 1 TABLET BY MOUTH EVERY DAY 30 tablet 10  . Coenzyme Q10 (CO Q-10) 100 MG CAPS Take 300 mg by mouth daily.    [name] DALIRESP 500 MCG TABS tablet Take 500 mcg by mouth daily.     [name] Dextromethorphan-Guaifenesin (MUCINEX DM MAXIMUM STRENGTH PO) Take by mouth every 4 (four) hours as needed.    . fluticasone (FLONASE) 50 MCG/ACT nasal spray Place 1 spray into both nostrils daily.    . furosemide (LASIX) 40 MG tablet Take 1 tablet (40 mg total) by mouth 2 (two) times daily. 60 tablet 11  . gabapentin (NEURONTIN) 300 MG capsule Take 600 mg by mouth 4 (four) times daily.    [name] ipratropium (ATROVENT) 0.06 % nasal spray 1 spray 3 (three) times daily.     [name] levalbuterol (XOPENEX) 0.63 MG/3ML nebulizer solution Take 3 mLs (0.63 mg total) by nebulization 4 (four) times daily. 3 mL 12  . losartan (COZAAR) 50 MG tablet Take 1 tablet (50 mg total) by mouth daily. 30 tablet 11  . nitroGLYCERIN (NITROSTAT) 0.4 MG SL tablet Place 1  tablet (0.4 mg total) under the tongue every 5 (five) minutes x 3 doses as needed for chest pain. 25 tablet 2  . OVER THE COUNTER MEDICATION Take by mouth daily. SOLUTION RX - FOR HIGH BLOOD PRESSURE SUPPORT- B 12 and multi vitamin    . OXYGEN-HELIUM IN Inhale into the lungs. Using 4 liters nasal cannula ,continuous    . potassium chloride (K-DUR) 10 MEQ tablet Take 10 mEq by mouth daily.    [name] SPIRIVA HANDIHALER 18 MCG inhalation capsule Place 18 mcg into inhaler and inhale daily.     . SYMBICORT 160-4.5 MCG/ACT inhaler Inhale 2 puffs into the lungs 2 (two) times daily.      No current facility-administered medications on file prior to visit.   Allergies  Allergen Reactions  . Codeine     Red blotches  . Penicillins  Childhood reaction   History  Substance Use Topics  . Smoking status: Former Smoker -- 1.00 packs/day for 40 years  . Smokeless tobacco: Never Used     Comment: [place] 10/2102  . Alcohol Use: No   Family History  Problem Relation Age of Onset  . Heart attack Mother   . Heart attack Father   . Cancer Sister     Wt Readings from Last 3 Encounters:  07/06/14 104.69 kg (230 lb 12.8 oz)  03/29/14 106.686 kg (235 lb 3.2 oz)  11/24/13 98.884 kg (218 lb)    PHYSICAL EXAM BP 124/84 mmHg  Pulse 67  Ht 5\' 11"  (1.803 m)  Wt 104.69 kg (230 lb 12.8 oz)  BMI 32.20 kg/m2 General appearance: alert, cooperative, appears older than stated age, chronically ill appearing in mild distress. More noticeable increased work of breathing.; on home oxygen and was breathing hard when walking into the clinic  Neck: mildlythe elevated JVP; supple no LAD and no carotid bruit.  Lungs: mild bilateral basal wheezing but no rhonchi or rales.normal respiratory for, but uses accessory muscles. He has increased AP diameter with prolonged expiratory phase with expiratory wheezing.  Heart: RRR, S1, S2 normal, no murmur, click, rub or gallop and normal apical impulse    Abdomen: soft, non-tender; bowel sounds normal; no masses, no organomegaly  Extremities: extremities normal, atraumatic, no cyanosis; 1-2+ bilateral LE edema; Pulses: 2+ and symmetric  Neurologic: Grossly normal   Adult ECG Report  Rate: 63;  Rhythm: normal sinus rhythm, normal axis, incomplete RBBB, CRO inferior infarct - age undetermined.  Narrative Interpretation: Stable EKG  Recent Labs:    Lab Results  Component Value Date   CHOL 174 01/26/2014   HDL 56 01/26/2014   LDLCALC 86 01/26/2014   TRIG 162* 01/26/2014   CHOLHDL 3.1 01/26/2014     Chemistry      Component Value Date/Time   NA 146* 01/26/2014 0838   K 4.7 01/26/2014 0838   CL 102 01/26/2014 0838   CO2 35* 01/26/2014 0838   BUN 18 01/26/2014 0838   CREATININE 0.89 01/26/2014 0838   CREATININE 0.70 11/22/2012 0330      Component Value Date/Time   CALCIUM 9.3 01/26/2014 0838   ALKPHOS 83 01/26/2014 0838   AST 18 01/26/2014 0838   ALT 27 01/26/2014 0838   BILITOT 0.5 01/26/2014 0838     ASSESSMENT / PLAN: Problem List Items Addressed This Visit    CAD S/P percutaneous coronary angioplasty - PCI mid RCA - 5.0 mm x 24 mm VeriFlex BMS (5.6 mm) (Chronic)    2 large caliber stents in the RCA system no recurrent anginal symptoms. Remains on Plavix without aspirin since he is on Eliquis.      Relevant Orders   EKG 12-Lead (Completed)   Cardiomyopathy, ischemic - EF 40-45% (Chronic)    Unfortunately he less than relatively sizable infarct is improved significantly after PCI. Continue to monitor the angina or worsening heart failure.      Chronic combined systolic and diastolic heart failure, NYHA class 2 - Primary (Chronic)    He may be less stable he sees them for a while. I think we'll set was currently uses as the dry weight. Continue sliding scale Lasix. He is on beta blocker and ARB along with diuretic.      Relevant Orders   EKG 12-Lead (Completed)   DOE (dyspnea on exertion)    Likely combination of  COPD as well  as his existing CAD with ischemic cardiomyopathy. He seems to be relatively comfortable although need to be more aggressive at this time.      Dyslipidemia, goal LDL below 70 (Chronic)    Labs I can see last echo from December when he was doing relatively well on atorvastatin. The coenzyme Q 10 as helped some.      Relevant Orders   EKG 12-Lead (Completed)   Former heavy cigarette smoker (20-39 per day)    Successfully and now absent and smoking for couple years. He appreciated the congratulate she does but has now developed significant COPD and coronary disease and evidently treated as a result.      Paroxysmal Atrial fibrillation with RVR - peri-MI; recurrent after DCCV (Chronic)    As far as intelligent no recurrence. Remains on Eliquis and beta blocker. (Bisoprolol)          Orders Placed This Encounter  Procedures  . EKG 12-Lead   Followup in 4-6 months  Katerina Zurn, Leonie Green, M.D., M.S. Interventional Cardiologist   Pager # [phone]

## 2014-07-08 ENCOUNTER — Encounter: Payer: Self-pay | Admitting: Cardiology

## 2014-07-08 NOTE — Assessment & Plan Note (Signed)
Unfortunately he less than relatively sizable infarct is improved significantly after PCI. Continue to monitor the angina or worsening heart failure.

## 2014-07-08 NOTE — Assessment & Plan Note (Signed)
As far as intelligent no recurrence. Remains on Eliquis and beta blocker. (Bisoprolol)

## 2014-07-08 NOTE — Assessment & Plan Note (Signed)
2 large caliber stents in the RCA system no recurrent anginal symptoms. Remains on Plavix without aspirin since he is on Eliquis.

## 2014-07-08 NOTE — Assessment & Plan Note (Signed)
Successfully and now absent and smoking for couple years. He appreciated the congratulate she does but has now developed significant COPD and coronary disease and evidently treated as a result.

## 2014-07-08 NOTE — Assessment & Plan Note (Signed)
He may be less stable he sees them for a while. I think we'll set was currently uses as the dry weight. Continue sliding scale Lasix. He is on beta blocker and ARB along with diuretic.

## 2014-07-08 NOTE — Assessment & Plan Note (Signed)
Labs I can see last echo from December when he was doing relatively well on atorvastatin. The coenzyme Q 10 as helped some.

## 2014-07-08 NOTE — Assessment & Plan Note (Signed)
Likely combination of COPD as well as his existing CAD with ischemic cardiomyopathy. He seems to be relatively comfortable although need to be more aggressive at this time.

## 2014-07-23 DIAGNOSIS — I110 Hypertensive heart disease with heart failure: Secondary | ICD-10-CM | POA: Diagnosis not present

## 2014-07-23 DIAGNOSIS — J449 Chronic obstructive pulmonary disease, unspecified: Secondary | ICD-10-CM | POA: Diagnosis not present

## 2014-07-23 DIAGNOSIS — I5042 Chronic combined systolic (congestive) and diastolic (congestive) heart failure: Secondary | ICD-10-CM | POA: Diagnosis not present

## 2014-07-23 DIAGNOSIS — I4891 Unspecified atrial fibrillation: Secondary | ICD-10-CM | POA: Diagnosis not present

## 2014-07-23 DIAGNOSIS — I10 Essential (primary) hypertension: Secondary | ICD-10-CM | POA: Diagnosis not present

## 2014-07-23 DIAGNOSIS — E785 Hyperlipidemia, unspecified: Secondary | ICD-10-CM | POA: Diagnosis not present

## 2014-07-23 DIAGNOSIS — Z79899 Other long term (current) drug therapy: Secondary | ICD-10-CM | POA: Diagnosis not present

## 2014-07-23 DIAGNOSIS — I480 Paroxysmal atrial fibrillation: Secondary | ICD-10-CM | POA: Diagnosis not present

## 2014-07-24 LAB — COMPREHENSIVE METABOLIC PANEL
ALBUMIN: 3.9 g/dL (ref 3.5–5.2)
ALT: 21 U/L (ref 0–53)
AST: 14 U/L (ref 0–37)
Alkaline Phosphatase: 79 U/L (ref 39–117)
BUN: 17 mg/dL (ref 6–23)
CALCIUM: 9.5 mg/dL (ref 8.4–10.5)
CO2: 36 mEq/L — ABNORMAL HIGH (ref 19–32)
CREATININE: 0.87 mg/dL (ref 0.50–1.35)
Chloride: 98 mEq/L (ref 96–112)
Glucose, Bld: 102 mg/dL — ABNORMAL HIGH (ref 70–99)
Potassium: 4.5 mEq/L (ref 3.5–5.3)
Sodium: 142 mEq/L (ref 135–145)
Total Bilirubin: 0.4 mg/dL (ref 0.2–1.2)
Total Protein: 6.8 g/dL (ref 6.0–8.3)

## 2014-07-24 LAB — LIPID PANEL
CHOLESTEROL: 168 mg/dL (ref 0–200)
HDL: 63 mg/dL (ref >=40)
LDL CALC: 71 mg/dL (ref 0–99)
TRIGLYCERIDES: 170 mg/dL — AB (ref <150)
Total CHOL/HDL Ratio: 2.7 Ratio
VLDL: 34 mg/dL (ref 0–40)

## 2014-07-26 DIAGNOSIS — J449 Chronic obstructive pulmonary disease, unspecified: Secondary | ICD-10-CM | POA: Diagnosis not present

## 2014-07-27 ENCOUNTER — Telehealth: Payer: Self-pay | Admitting: *Deleted

## 2014-07-27 NOTE — Telephone Encounter (Signed)
-----   Message from Leonie Man, MD sent at 07/26/2014  3:12 PM EDT ----- Labs look pretty stable all around.   Renal function is good. Cholesterol continues to improve.  Continue current meds.  [place]

## 2014-07-27 NOTE — Telephone Encounter (Signed)
Spoke to patient. Result given . Verbalized understanding ROUTED [place]

## 2014-08-09 ENCOUNTER — Other Ambulatory Visit: Payer: Self-pay | Admitting: Cardiology

## 2014-08-09 NOTE — Telephone Encounter (Signed)
Rx(s) sent to pharmacy electronically.  

## 2014-08-25 DIAGNOSIS — J449 Chronic obstructive pulmonary disease, unspecified: Secondary | ICD-10-CM | POA: Diagnosis not present

## 2014-09-25 DIAGNOSIS — J449 Chronic obstructive pulmonary disease, unspecified: Secondary | ICD-10-CM | POA: Diagnosis not present

## 2014-10-15 DIAGNOSIS — Z1389 Encounter for screening for other disorder: Secondary | ICD-10-CM | POA: Diagnosis not present

## 2014-10-15 DIAGNOSIS — Z6832 Body mass index (BMI) 32.0-32.9, adult: Secondary | ICD-10-CM | POA: Diagnosis not present

## 2014-10-15 DIAGNOSIS — J301 Allergic rhinitis due to pollen: Secondary | ICD-10-CM | POA: Diagnosis not present

## 2014-10-15 DIAGNOSIS — J019 Acute sinusitis, unspecified: Secondary | ICD-10-CM | POA: Diagnosis not present

## 2014-10-15 DIAGNOSIS — E6609 Other obesity due to excess calories: Secondary | ICD-10-CM | POA: Diagnosis not present

## 2014-10-26 DIAGNOSIS — J449 Chronic obstructive pulmonary disease, unspecified: Secondary | ICD-10-CM | POA: Diagnosis not present

## 2014-11-22 DIAGNOSIS — I4891 Unspecified atrial fibrillation: Secondary | ICD-10-CM | POA: Diagnosis not present

## 2014-11-22 DIAGNOSIS — J449 Chronic obstructive pulmonary disease, unspecified: Secondary | ICD-10-CM | POA: Diagnosis not present

## 2014-11-22 DIAGNOSIS — I10 Essential (primary) hypertension: Secondary | ICD-10-CM | POA: Diagnosis not present

## 2014-11-22 DIAGNOSIS — I110 Hypertensive heart disease with heart failure: Secondary | ICD-10-CM | POA: Diagnosis not present

## 2014-11-25 DIAGNOSIS — J449 Chronic obstructive pulmonary disease, unspecified: Secondary | ICD-10-CM | POA: Diagnosis not present

## 2014-12-26 DIAGNOSIS — J449 Chronic obstructive pulmonary disease, unspecified: Secondary | ICD-10-CM | POA: Diagnosis not present

## 2015-01-04 ENCOUNTER — Ambulatory Visit (INDEPENDENT_AMBULATORY_CARE_PROVIDER_SITE_OTHER): Payer: Medicare Other | Admitting: Cardiology

## 2015-01-04 ENCOUNTER — Encounter: Payer: Self-pay | Admitting: Cardiology

## 2015-01-04 VITALS — BP 122/84 | HR 80 | Ht 71.0 in | Wt 235.8 lb

## 2015-01-04 DIAGNOSIS — E785 Hyperlipidemia, unspecified: Secondary | ICD-10-CM

## 2015-01-04 DIAGNOSIS — I10 Essential (primary) hypertension: Secondary | ICD-10-CM

## 2015-01-04 DIAGNOSIS — J42 Unspecified chronic bronchitis: Secondary | ICD-10-CM

## 2015-01-04 DIAGNOSIS — I480 Paroxysmal atrial fibrillation: Secondary | ICD-10-CM | POA: Diagnosis not present

## 2015-01-04 DIAGNOSIS — I2510 Atherosclerotic heart disease of native coronary artery without angina pectoris: Secondary | ICD-10-CM

## 2015-01-04 DIAGNOSIS — I255 Ischemic cardiomyopathy: Secondary | ICD-10-CM

## 2015-01-04 DIAGNOSIS — R0609 Other forms of dyspnea: Secondary | ICD-10-CM

## 2015-01-04 DIAGNOSIS — I5042 Chronic combined systolic (congestive) and diastolic (congestive) heart failure: Secondary | ICD-10-CM | POA: Diagnosis not present

## 2015-01-04 DIAGNOSIS — Z9861 Coronary angioplasty status: Secondary | ICD-10-CM | POA: Diagnosis not present

## 2015-01-04 DIAGNOSIS — E669 Obesity, unspecified: Secondary | ICD-10-CM

## 2015-01-04 MED ORDER — FUROSEMIDE 40 MG PO TABS: ORAL_TABLET | ORAL | Status: DC

## 2015-01-04 NOTE — Patient Instructions (Signed)
INCREASE LASIX 1 -2 TABLETS A DAY   PICK 2 DAYS OUT OF THE WEEK YOU WILL TAKE 2 TABLETS IN THE MORNING AND 1 TABLET IN THE EVENING.  Your physician discussed the importance of regular exercise and recommended that you start or continue a regular exercise program for good health. TRY WALKING ON TREADMILL AT 5 MIN INCREMENTS AT2 -3 TIMES A DAY INCREASING TO 10 MIN TO THE ULTIMATE GOAL  (MOVE OXYGEN TO 6 LITERS WHILE WAKING)    Your physician wants you to follow-up in 6 MONTHS DR HARDING - 30 MIN. You will receive a reminder letter in the mail two months in advance. If you don't receive a letter, please call our office to schedule the follow-up appointment.  If you need a refill on your cardiac medications before your next appointment, please call your pharmacy.

## 2015-01-04 NOTE — Progress Notes (Signed)
PCP: [name], MD  Clinic Note: Chief Complaint  Patient presents with  . Follow-up    6 month  . Coronary Artery Disease    H/o Inf STEMI 10/2012  . Cardiomyopathy    Ischemic - EF 40-45%  . Chest Pain    no chest pain  . Shortness of Breath    pt states he is still SOB and feels as if its gotten worse, no light headedness or dizziness  . Edema    no edema    HPI: Benjamin [name] is a 59 y.o. male with a PMH below who presents today for three-month followup for his CAD he has chronic ischemic cardiomyopathy and chronic combined systolic/ diastolic heart failure along with long-standing COPD from a 40-pack-year smoking history. He is on home oxygen at 4 L.  He had an inferior STEMI in October of 2014 with very large RCA was occluded and old thrombus --> PCI with 5 mm x 24 mm VeriFlex BMS post dilated about 5.5 mm. It was relatively difficult post MI hospital course and cardiac shock and atrial fibrillation that required cardioversion. NO recurrent angina since - but has had some CHF Sx of combined systolic / diastolic CHF. He has a moderate ischemic EF of roughly 40-45% it was roughly stable on followup echo.  [facility] Dry Wgt = 212 lbs. (has gained weight - from lack of exercise - was 235 last visit).  I saw him in the beginning of June, - have not made significant adjustments since..  No recent Hospitalizations or studies reviewed. 2 Rx courses - PCP & Pulmonologist - steroids & Abx  Interval History:  Satchel is still noting that dyspnea. He did see me actually have some worsening dyspnea. He has had at least 2 episodes more he's been put on antibiotics by his PCP. He still coughing and wheezing. Usually however it only requires one maybe 2 pillows. He is using an extra pillow because of the congestion and nonproductive dyspnea. No PND. No orthopnea or edema. He notes occasional pressure in his chest but usually this happens with his arm or of his head or coughs  very hard. No anginal type chest pain. He has been doing relatively well with sliding scale Lasix, and not having to use too much additional Lasix. Maybe once or twice a week. His issue was that he was scared he was febrile.  Cardiovascular ROS: positive for - dyspnea on exertion, irregular heartbeat and Edema is notably also improved. He does have increased abdominal girth but is not sure this is not his baseline. negative for - chest pain, irregular heartbeat, orthopnea, paroxysmal nocturnal dyspnea, rapid heart rate, shortness of breath or Syncope/near syncope, TIA/amaurosis fugax.   Past Medical History  Diagnosis Date  . Tobacco abuse     40 Pk-yr (1- 1 1/2 PPD) --> Quit 11/18/2012 when presented with STEMI  . COPD (chronic obstructive pulmonary disease) ([place])   . STEMI of inferior wall 11/18/12- RCA BMS 11/17/2012    Cardiogenic shock - post MI with RV infarction [785.51]  . CAD S/P percutaneous coronary angioplasty  11/18/2012    - PCI mid RCA - 5.0 mm x 24 mm VeriFlex BMS (5.6 mm)  . Atrial fibrillation with RVR - peri-MI; recurrent after DCCV 11/19/2012  . Ischemic cardiomyopathy 11/19/2012    EF 40-45% with inferoseptal HK, Gr 2DD - high LVEDP/LAP. --> confirmed by f/u Echo 03/2013.  [name] Dyslipidemia- low HDL 11/19/2012  . Chronic combined systolic and  diastolic heart failure, NYHA class 2 ([place]) 11/17/2012  . Colon polyp     Status post polypectomy x3.   ROS: A comprehensive was performed. Review of Systems  Constitutional: Negative for malaise/fatigue.  Respiratory: Positive for cough, sputum production (Am cough -- just finished Abx), shortness of breath and wheezing (No more than usual).   Cardiovascular: Negative for chest pain, palpitations, orthopnea, claudication, leg swelling and PND.  Gastrointestinal: Negative for blood in stool and melena.  Genitourinary: Negative for dysuria and hematuria.  Musculoskeletal: Positive for back pain (occasional  - nothing major).  Negative for falls.       Cramps helped with vinegar  Neurological: Negative for dizziness, sensory change, seizures, loss of consciousness and weakness.  Endo/Heme/Allergies: Bruises/bleeds easily (mild).  Psychiatric/Behavioral: The patient has insomnia (Sleeping better.).   All other systems reviewed and are negative.  Prior to Admission medications   Medication Sig Start Date End Date Taking? Authorizing Provider  acetaminophen (TYLENOL) 500 MG tablet Take by mouth every 6 (six) hours as needed for pain.   Yes Historical Provider, MD  albuterol (PROVENTIL HFA;VENTOLIN HFA) 108 (90 BASE) MCG/ACT inhaler Inhale 2 puffs into the lungs every 6 (six) hours as needed for wheezing.   Yes Historical Provider, MD  albuterol (PROVENTIL) (2.5 MG/3ML) 0.083% nebulizer solution Inhale 0.083 mLs into the lungs 4 (four) times daily. 12/11/14  Yes Historical Provider, MD  apixaban (ELIQUIS) 5 MG TABS tablet Take 1 tablet (5 mg total) by mouth 2 (two) times daily. 03/05/14  Yes Leonie Man, MD  atorvastatin (LIPITOR) 40 MG tablet Take 1 tablet (40 mg total) by mouth daily. 06/10/14  Yes Leonie Man, MD  bisoprolol (ZEBETA) 5 MG tablet Take 0.5 tablets (2.5 mg total) by mouth daily. 06/10/14  Yes Leonie Man, MD  clopidogrel (PLAVIX) 75 MG tablet TAKE 1 TABLET BY MOUTH EVERY DAY 04/12/14  Yes Leonie Man, MD  Coenzyme Q10 (CO Q-10) 100 MG CAPS Take 300 mg by mouth daily.   Yes Historical Provider, MD  DALIRESP 500 MCG TABS tablet Take 500 mcg by mouth daily.  08/04/13  Yes Historical Provider, MD  Dextromethorphan-Guaifenesin (MUCINEX DM MAXIMUM STRENGTH PO) Take by mouth every 4 (four) hours as needed.   Yes Historical Provider, MD  furosemide (LASIX) 40 MG tablet TAKE 1 TABLET BY MOUTH TWICE DAILY 08/09/14  Yes Leonie Man, MD  gabapentin (NEURONTIN) 300 MG capsule Take 600 mg by mouth 4 (four) times daily.   Yes Historical Provider, MD  ipratropium (ATROVENT) 0.06 % nasal spray 1 spray as  needed.  11/17/13  Yes Historical Provider, MD  losartan (COZAAR) 50 MG tablet Take 1 tablet (50 mg total) by mouth daily. 06/10/14  Yes Leonie Man, MD  nitroGLYCERIN (NITROSTAT) 0.4 MG SL tablet Place 1 tablet (0.4 mg total) under the tongue every 5 (five) minutes x 3 doses as needed for chest pain. 11/22/12  Yes Brittainy Erie Noe, PA-C  OXYGEN-HELIUM IN Inhale into the lungs. Using 4 liters nasal cannula ,continuous   Yes Historical Provider, MD  potassium chloride (K-DUR) 10 MEQ tablet Take 10 mEq by mouth daily.   Yes Historical Provider, MD  SPIRIVA HANDIHALER 18 MCG inhalation capsule Place 18 mcg into inhaler and inhale daily.  06/20/13  Yes Historical Provider, MD  SYMBICORT 160-4.5 MCG/ACT inhaler Inhale 2 puffs into the lungs 2 (two) times daily.  06/18/13  Yes Historical Provider, MD    Allergies  Allergen Reactions  . Codeine  Red blotches  . Penicillins     Childhood reaction   Social History  Substance Use Topics  . Smoking status: Former Smoker -- 1.00 packs/day for 40 years  . Smokeless tobacco: Never Used     Comment: [place] 10/2102  . Alcohol Use: No   Family History  Problem Relation Age of Onset  . Heart attack Mother   . Heart attack Father   . Cancer Sister     Wt Readings from Last 3 Encounters:  01/04/15 235 lb 12.8 oz (106.958 kg)  07/06/14 230 lb 12.8 oz (104.69 kg)  03/29/14 235 lb 3.2 oz (106.686 kg)    PHYSICAL EXAM BP 122/84 mmHg  Pulse 80  Ht 5\' 11"  (1.803 m)  Wt 235 lb 12.8 oz (106.958 kg)  BMI 32.90 kg/m2 General appearance: alert, cooperative, appears older than stated age, chronically ill appearing in mild distress. More noticeable increased work of breathing.; on home oxygen and was breathing hard when walking into the clinic  Neck: mildlythe elevated JVP; supple no LAD and no carotid bruit.  Lungs: mild bilateral basal wheezing but no rhonchi or rales.normal respiratory for, but uses accessory muscles. He  has increased AP diameter with prolonged expiratory phase with expiratory wheezing.  Heart: RRR, S1, S2 normal, no murmur, click, rub or gallop and normal apical impulse  Abdomen: soft, non-tender; bowel sounds normal; no masses, no organomegaly  Extremities: extremities normal, atraumatic, no cyanosis; 1-2+ bilateral LE edema; Pulses: 2+ and symmetric  Neurologic: Grossly normal   Adult ECG Report  Rate: 80;  Rhythm: normal sinus rhythm, normal axis, incomplete RBBB, CRO inferior infarct - age undetermined.  Narrative Interpretation: Stable EKG  Recent Labs:    Lab Results  Component Value Date   CHOL 168 07/23/2014   HDL 63 07/23/2014   LDLCALC 71 07/23/2014   TRIG 170* 07/23/2014   CHOLHDL 2.7 07/23/2014     Chemistry      Component Value Date/Time   NA 142 07/23/2014 0715   K 4.5 07/23/2014 0715   CL 98 07/23/2014 0715   CO2 36* 07/23/2014 0715   BUN 17 07/23/2014 0715   CREATININE 0.87 07/23/2014 0715   CREATININE 0.70 11/22/2012 0330      Component Value Date/Time   CALCIUM 9.5 07/23/2014 0715   ALKPHOS 79 07/23/2014 0715   AST 14 07/23/2014 0715   ALT 21 07/23/2014 0715   BILITOT 0.4 07/23/2014 0715     ASSESSMENT / PLAN: Problem List Items Addressed This Visit    Paroxysmal Atrial fibrillation with RVR - peri-MI; recurrent after DCCV (Chronic)    No recurrence. Remains anticoagulated on ELIQUIS. On beta blocker for rate control if it recurs. Low threshold for an anti-arrhythmic medication, if he were to recur.      Relevant Medications   furosemide (LASIX) 40 MG tablet   Other Relevant Orders   EKG 12-Lead (Completed)   Obesity (BMI 30.0-34.9) (Chronic)    Do think some of his exertional dyspnea is related to obesity. We talked about the importance of trying to work on weight loss. He says he is not eating a lot. He simply needs to burn calories. I've discussed during 5 minute walks 3-4 times a day gradually increase up to 10 minute walks 3-4 times a day. He  may need to increase his oxygen from 2 to3 L while exercising.      Essential hypertension (Chronic)    Will control blood pressure. Continue current medications.  Relevant Medications   furosemide (LASIX) 40 MG tablet   Dyslipidemia, goal LDL below 70 (Chronic)    Most recent labs from June show relatively well-controlled lipids. Continue atorvastatin plus coenzyme Q 10. Recheck next June      Relevant Medications   furosemide (LASIX) 40 MG tablet   Other Relevant Orders   EKG 12-Lead (Completed)   DOE (dyspnea on exertion) (Chronic)    Combination of COPD and obesity as well as deconditioning. Does not sound like it is related to angina. He could have some diastolic dysfunction component, but this is well treated.      COPD (chronic obstructive pulmonary disease) - Now on Home O2 (Chronic)   Relevant Medications   albuterol (PROVENTIL) (2.5 MG/3ML) 0.083% nebulizer solution   Chronic combined systolic and diastolic heart failure, NYHA class 2 (H[facility]) - Primary (Chronic)    Sliding scale Lasix. On beta blocker and ARB. I think he may be best for him to take 2 days of the week and take an additional dose of Lasix in addition to sliding scale when necessary dosing.      Relevant Medications   furosemide (LASIX) 40 MG tablet   Other Relevant Orders   EKG 12-Lead (Completed)   Cardiomyopathy, ischemic - EF 40-45% (Chronic)    No significant improvement and followup evaluations of his EF.  He is on a beta blocker plus ARB along with standing Lasix. Will increase Lasix prescription to allow for when necessary dosing.      Relevant Medications   furosemide (LASIX) 40 MG tablet   CAD S/P percutaneous coronary angioplasty - PCI mid RCA - 5.0 mm x 24 mm VeriFlex BMS (5.6 mm) (Chronic)    No active anginal symptoms. He had a very large caliber RCA was treated with 2 stents that were 4.5-5.5 mm in diameter -- very unlikely to have in-stent stenosis or thrombosis. On Plavix. On  statin, no myalgias in conjunction with coenzyme Q10. On bisoprolol (in deference to COPD for beta-1 selective beta blocker) and ARB.      Relevant Medications   furosemide (LASIX) 40 MG tablet   Other Relevant Orders   EKG 12-Lead (Completed)      INCREASE LASIX 1 -2 TABLETS A DAY   PICK 2 DAYS OUT OF THE WEEK YOU WILL TAKE 2 TABLETS IN THE MORNING AND 1 TABLET IN THE EVENING.  Your physician discussed the importance of regular exercise and recommended that you start or continue a regular exercise program for good health. TRY WALKING ON TREADMILL AT 5 MIN INCREMENTS AT2 -3 TIMES A DAY INCREASING TO 10 MIN TO THE ULTIMATE GOAL  (MOVE OXYGEN TO 6 LITERS WHILE WAKING)    Your physician wants you to follow-up in 6 MONTHS DR Delmus Warwick - 30 MIN Orders Placed This Encounter  Procedures  . EKG 12-Lead    Leonie Man, M.D., M.S. Interventional Cardiologist   Pager # [phone]

## 2015-01-06 ENCOUNTER — Encounter: Payer: Self-pay | Admitting: Cardiology

## 2015-01-06 ENCOUNTER — Other Ambulatory Visit: Payer: Self-pay | Admitting: Cardiology

## 2015-01-06 NOTE — Assessment & Plan Note (Signed)
Do think some of his exertional dyspnea is related to obesity. We talked about the importance of trying to work on weight loss. He says he is not eating a lot. He simply needs to burn calories. I've discussed during 5 minute walks 3-4 times a day gradually increase up to 10 minute walks 3-4 times a day. He may need to increase his oxygen from 2 to3 L while exercising.

## 2015-01-06 NOTE — Assessment & Plan Note (Signed)
Combination of COPD and obesity as well as deconditioning. Does not sound like it is related to angina. He could have some diastolic dysfunction component, but this is well treated.

## 2015-01-06 NOTE — Assessment & Plan Note (Signed)
Sliding scale Lasix. On beta blocker and ARB. I think he may be best for him to take 2 days of the week and take an additional dose of Lasix in addition to sliding scale when necessary dosing.

## 2015-01-06 NOTE — Assessment & Plan Note (Signed)
No active anginal symptoms. He had a very large caliber RCA was treated with 2 stents that were 4.5-5.5 mm in diameter -- very unlikely to have in-stent stenosis or thrombosis. On Plavix. On statin, no myalgias in conjunction with coenzyme Q10. On bisoprolol (in deference to COPD for beta-1 selective beta blocker) and ARB.

## 2015-01-06 NOTE — Assessment & Plan Note (Signed)
No significant improvement and followup evaluations of his EF.  He is on a beta blocker plus ARB along with standing Lasix. Will increase Lasix prescription to allow for when necessary dosing.

## 2015-01-06 NOTE — Assessment & Plan Note (Signed)
Will control blood pressure. Continue current medications.

## 2015-01-06 NOTE — Assessment & Plan Note (Signed)
No recurrence. Remains anticoagulated on ELIQUIS. On beta blocker for rate control if it recurs. Low threshold for an anti-arrhythmic medication, if he were to recur.

## 2015-01-06 NOTE — Assessment & Plan Note (Addendum)
Most recent labs from June show relatively well-controlled lipids. Continue atorvastatin plus coenzyme Q 10. Recheck next June

## 2015-01-25 DIAGNOSIS — J449 Chronic obstructive pulmonary disease, unspecified: Secondary | ICD-10-CM | POA: Diagnosis not present

## 2015-01-31 ENCOUNTER — Emergency Department (HOSPITAL_COMMUNITY): Payer: Medicare Other

## 2015-01-31 ENCOUNTER — Inpatient Hospital Stay (HOSPITAL_COMMUNITY)
Admission: EM | Admit: 2015-01-31 | Discharge: 2015-02-02 | DRG: 190 | Disposition: A | Discharge status: Home / Self Care | Payer: Medicare Other | Attending: Family Medicine | Admitting: Family Medicine

## 2015-01-31 ENCOUNTER — Encounter (HOSPITAL_COMMUNITY): Payer: Self-pay | Admitting: Emergency Medicine

## 2015-01-31 DIAGNOSIS — Z9981 Dependence on supplemental oxygen: Secondary | ICD-10-CM

## 2015-01-31 DIAGNOSIS — Z7902 Long term (current) use of antithrombotics/antiplatelets: Secondary | ICD-10-CM

## 2015-01-31 DIAGNOSIS — E785 Hyperlipidemia, unspecified: Secondary | ICD-10-CM | POA: Diagnosis not present

## 2015-01-31 DIAGNOSIS — R0602 Shortness of breath: Secondary | ICD-10-CM | POA: Diagnosis not present

## 2015-01-31 DIAGNOSIS — I2510 Atherosclerotic heart disease of native coronary artery without angina pectoris: Secondary | ICD-10-CM

## 2015-01-31 DIAGNOSIS — R0609 Other forms of dyspnea: Secondary | ICD-10-CM | POA: Diagnosis not present

## 2015-01-31 DIAGNOSIS — Z809 Family history of malignant neoplasm, unspecified: Secondary | ICD-10-CM

## 2015-01-31 DIAGNOSIS — Z7951 Long term (current) use of inhaled steroids: Secondary | ICD-10-CM

## 2015-01-31 DIAGNOSIS — J449 Chronic obstructive pulmonary disease, unspecified: Secondary | ICD-10-CM | POA: Diagnosis present

## 2015-01-31 DIAGNOSIS — J9620 Acute and chronic respiratory failure, unspecified whether with hypoxia or hypercapnia: Secondary | ICD-10-CM | POA: Diagnosis present

## 2015-01-31 DIAGNOSIS — I4891 Unspecified atrial fibrillation: Secondary | ICD-10-CM | POA: Diagnosis not present

## 2015-01-31 DIAGNOSIS — Z9861 Coronary angioplasty status: Secondary | ICD-10-CM | POA: Diagnosis not present

## 2015-01-31 DIAGNOSIS — I252 Old myocardial infarction: Secondary | ICD-10-CM

## 2015-01-31 DIAGNOSIS — J441 Chronic obstructive pulmonary disease with (acute) exacerbation: Principal | ICD-10-CM | POA: Diagnosis present

## 2015-01-31 DIAGNOSIS — Z79899 Other long term (current) drug therapy: Secondary | ICD-10-CM | POA: Diagnosis not present

## 2015-01-31 DIAGNOSIS — I255 Ischemic cardiomyopathy: Secondary | ICD-10-CM | POA: Diagnosis not present

## 2015-01-31 DIAGNOSIS — Z87891 Personal history of nicotine dependence: Secondary | ICD-10-CM

## 2015-01-31 DIAGNOSIS — Z8249 Family history of ischemic heart disease and other diseases of the circulatory system: Secondary | ICD-10-CM

## 2015-01-31 DIAGNOSIS — I5042 Chronic combined systolic (congestive) and diastolic (congestive) heart failure: Secondary | ICD-10-CM | POA: Diagnosis present

## 2015-01-31 DIAGNOSIS — I110 Hypertensive heart disease with heart failure: Secondary | ICD-10-CM | POA: Diagnosis not present

## 2015-01-31 DIAGNOSIS — Z955 Presence of coronary angioplasty implant and graft: Secondary | ICD-10-CM | POA: Diagnosis not present

## 2015-01-31 DIAGNOSIS — I10 Essential (primary) hypertension: Secondary | ICD-10-CM | POA: Diagnosis present

## 2015-01-31 DIAGNOSIS — I5032 Chronic diastolic (congestive) heart failure: Secondary | ICD-10-CM | POA: Diagnosis present

## 2015-01-31 DIAGNOSIS — R05 Cough: Secondary | ICD-10-CM | POA: Diagnosis not present

## 2015-01-31 LAB — CBC WITH DIFFERENTIAL/PLATELET
BASOS ABS: 0 10*3/uL (ref 0.0–0.1)
Basophils Relative: 0 %
EOS ABS: 0.1 10*3/uL (ref 0.0–0.7)
EOS PCT: 1 %
HCT: 38 % — ABNORMAL LOW (ref 39.0–52.0)
Hemoglobin: 11.9 g/dL — ABNORMAL LOW (ref 13.0–17.0)
Lymphocytes Relative: 14 %
Lymphs Abs: 1.3 10*3/uL (ref 0.7–4.0)
MCH: 29.8 pg (ref 26.0–34.0)
MCHC: 31.3 g/dL (ref 30.0–36.0)
MCV: 95 fL (ref 78.0–100.0)
Monocytes Absolute: 1.5 10*3/uL — ABNORMAL HIGH (ref 0.1–1.0)
Monocytes Relative: 15 %
Neutro Abs: 6.8 10*3/uL (ref 1.7–7.7)
Neutrophils Relative %: 70 %
PLATELETS: 355 10*3/uL (ref 150–400)
RBC: 4 MIL/uL — AB (ref 4.22–5.81)
RDW: 14.5 % (ref 11.5–15.5)
WBC: 9.6 10*3/uL (ref 4.0–10.5)

## 2015-01-31 LAB — COMPREHENSIVE METABOLIC PANEL
ALT: 19 U/L (ref 17–63)
AST: 16 U/L (ref 15–41)
Albumin: 3.6 g/dL (ref 3.5–5.0)
Alkaline Phosphatase: 75 U/L (ref 38–126)
Anion gap: 8 (ref 5–15)
BUN: 17 mg/dL (ref 6–20)
CHLORIDE: 97 mmol/L — AB (ref 101–111)
CO2: 35 mmol/L — AB (ref 22–32)
CREATININE: 0.97 mg/dL (ref 0.61–1.24)
Calcium: 8.9 mg/dL (ref 8.9–10.3)
GFR calc non Af Amer: >60 mL/min (ref >60)
Glucose, Bld: 114 mg/dL — ABNORMAL HIGH (ref 65–99)
POTASSIUM: 4.5 mmol/L (ref 3.5–5.1)
SODIUM: 140 mmol/L (ref 135–145)
Total Bilirubin: 0.5 mg/dL (ref 0.3–1.2)
Total Protein: 7.7 g/dL (ref 6.5–8.1)

## 2015-01-31 LAB — I-STAT TROPONIN, ED: Troponin i, poc: 0 ng/mL (ref 0.00–0.08)

## 2015-01-31 LAB — BRAIN NATRIURETIC PEPTIDE: B Natriuretic Peptide: 63 pg/mL (ref 0.0–100.0)

## 2015-01-31 MED ORDER — ONDANSETRON HCL 4 MG/2ML IJ SOLN: 4.0000 mg | Freq: Four times a day (QID) | INTRAMUSCULAR | Status: DC | PRN

## 2015-01-31 MED ORDER — SODIUM CHLORIDE 0.9 % IJ SOLN: 3.0000 mL | INTRAMUSCULAR | Status: DC | PRN

## 2015-01-31 MED ORDER — SODIUM CHLORIDE 0.9 % IJ SOLN
3.0000 mL | Freq: Two times a day (BID) | INTRAMUSCULAR | Status: DC
  Administered 2015-01-31 – 2015-02-01 (×3): 3 mL via INTRAVENOUS

## 2015-01-31 MED ORDER — GABAPENTIN 300 MG PO CAPS
600.0000 mg | ORAL_CAPSULE | Freq: Four times a day (QID) | ORAL | Status: DC
  Administered 2015-01-31 – 2015-02-02 (×7): 600 mg via ORAL
  Filled 2015-01-31 (×7): qty 2

## 2015-01-31 MED ORDER — ALBUTEROL SULFATE (2.5 MG/3ML) 0.083% IN NEBU: 2.5000 mg | INHALATION_SOLUTION | Freq: Four times a day (QID) | RESPIRATORY_TRACT | Status: DC

## 2015-01-31 MED ORDER — POTASSIUM CHLORIDE ER 10 MEQ PO TBCR
10.0000 meq | EXTENDED_RELEASE_TABLET | Freq: Every day | ORAL | Status: DC
  Filled 2015-01-31 (×2): qty 1

## 2015-01-31 MED ORDER — ATORVASTATIN CALCIUM 40 MG PO TABS
40.0000 mg | ORAL_TABLET | Freq: Every day | ORAL | Status: DC
  Administered 2015-01-31 – 2015-02-01 (×2): 40 mg via ORAL
  Filled 2015-01-31 (×2): qty 1

## 2015-01-31 MED ORDER — CETYLPYRIDINIUM CHLORIDE 0.05 % MT LIQD: 7.0000 mL | Freq: Two times a day (BID) | OROMUCOSAL | Status: DC

## 2015-01-31 MED ORDER — SODIUM CHLORIDE 0.9 % IV SOLN: 250.0000 mL | INTRAVENOUS | Status: DC | PRN

## 2015-01-31 MED ORDER — SODIUM CHLORIDE 0.9 % IJ SOLN
3.0000 mL | Freq: Two times a day (BID) | INTRAMUSCULAR | Status: DC
  Administered 2015-02-02: 3 mL via INTRAVENOUS

## 2015-01-31 MED ORDER — ALBUTEROL SULFATE (2.5 MG/3ML) 0.083% IN NEBU: 2.5000 mg | INHALATION_SOLUTION | RESPIRATORY_TRACT | Status: DC | PRN

## 2015-01-31 MED ORDER — ROFLUMILAST 500 MCG PO TABS
500.0000 ug | ORAL_TABLET | Freq: Every day | ORAL | Status: DC
  Administered 2015-02-01 – 2015-02-02 (×2): 500 ug via ORAL
  Filled 2015-01-31 (×2): qty 1

## 2015-01-31 MED ORDER — INFLUENZA VAC SPLIT QUAD 0.5 ML IM SUSY: 0.5000 mL | PREFILLED_SYRINGE | INTRAMUSCULAR | Status: DC

## 2015-01-31 MED ORDER — IPRATROPIUM BROMIDE 0.02 % IN SOLN: 0.5000 mg | Freq: Four times a day (QID) | RESPIRATORY_TRACT | Status: DC

## 2015-01-31 MED ORDER — CO Q-10 100 MG PO CAPS: 300.0000 mg | ORAL_CAPSULE | Freq: Every day | ORAL | Status: DC

## 2015-01-31 MED ORDER — APIXABAN 5 MG PO TABS
5.0000 mg | ORAL_TABLET | Freq: Two times a day (BID) | ORAL | Status: DC
Start: 2015-01-31 — End: 2015-02-02
  Administered 2015-01-31 – 2015-02-02 (×4): 5 mg via ORAL
  Filled 2015-01-31 (×4): qty 1

## 2015-01-31 MED ORDER — AZITHROMYCIN 250 MG PO TABS
500.0000 mg | ORAL_TABLET | Freq: Every day | ORAL | Status: AC
  Administered 2015-01-31: 500 mg via ORAL
  Filled 2015-01-31: qty 2

## 2015-01-31 MED ORDER — METHYLPREDNISOLONE SODIUM SUCC 125 MG IJ SOLR
80.0000 mg | Freq: Two times a day (BID) | INTRAMUSCULAR | Status: DC
  Administered 2015-02-01 – 2015-02-02 (×3): 80 mg via INTRAVENOUS
  Filled 2015-01-31 (×3): qty 2

## 2015-01-31 MED ORDER — BISOPROLOL FUMARATE 5 MG PO TABS
2.5000 mg | ORAL_TABLET | Freq: Every day | ORAL | Status: DC
  Filled 2015-01-31 (×2): qty 0.5

## 2015-01-31 MED ORDER — PNEUMOCOCCAL VAC POLYVALENT 25 MCG/0.5ML IJ INJ: 0.5000 mL | INJECTION | INTRAMUSCULAR | Status: DC

## 2015-01-31 MED ORDER — GUAIFENESIN-DM 100-10 MG/5ML PO SYRP
5.0000 mL | ORAL_SOLUTION | ORAL | Status: DC | PRN
  Administered 2015-02-01 (×3): 5 mL via ORAL
  Filled 2015-01-31 (×3): qty 5

## 2015-01-31 MED ORDER — AZITHROMYCIN 250 MG PO TABS
250.0000 mg | ORAL_TABLET | Freq: Every day | ORAL | Status: DC
  Administered 2015-02-01: 250 mg via ORAL
  Filled 2015-01-31: qty 1

## 2015-01-31 MED ORDER — IPRATROPIUM-ALBUTEROL 0.5-2.5 (3) MG/3ML IN SOLN
3.0000 mL | Freq: Four times a day (QID) | RESPIRATORY_TRACT | Status: DC
  Administered 2015-01-31 – 2015-02-02 (×8): 3 mL via RESPIRATORY_TRACT
  Filled 2015-01-31 (×7): qty 3

## 2015-01-31 MED ORDER — CLOPIDOGREL BISULFATE 75 MG PO TABS
75.0000 mg | ORAL_TABLET | Freq: Every day | ORAL | Status: DC
  Administered 2015-02-01 – 2015-02-02 (×2): 75 mg via ORAL
  Filled 2015-01-31 (×2): qty 1

## 2015-01-31 MED ORDER — LOSARTAN POTASSIUM 50 MG PO TABS
50.0000 mg | ORAL_TABLET | Freq: Every day | ORAL | Status: DC
  Administered 2015-02-01 – 2015-02-02 (×2): 50 mg via ORAL
  Filled 2015-01-31 (×2): qty 1

## 2015-01-31 MED ORDER — METHYLPREDNISOLONE SODIUM SUCC 125 MG IJ SOLR
125.0000 mg | Freq: Once | INTRAMUSCULAR | Status: AC
  Administered 2015-01-31: 125 mg via INTRAVENOUS
  Filled 2015-01-31: qty 2

## 2015-01-31 MED ORDER — IPRATROPIUM-ALBUTEROL 0.5-2.5 (3) MG/3ML IN SOLN
3.0000 mL | Freq: Once | RESPIRATORY_TRACT | Status: AC
  Administered 2015-01-31: 3 mL via RESPIRATORY_TRACT
  Filled 2015-01-31: qty 3

## 2015-01-31 MED ORDER — NITROGLYCERIN 0.4 MG SL SUBL: 0.4000 mg | SUBLINGUAL_TABLET | SUBLINGUAL | Status: DC | PRN

## 2015-01-31 MED ORDER — ONDANSETRON HCL 4 MG PO TABS: 4.0000 mg | ORAL_TABLET | Freq: Four times a day (QID) | ORAL | Status: DC | PRN

## 2015-01-31 MED ORDER — ALBUTEROL SULFATE (2.5 MG/3ML) 0.083% IN NEBU
2.5000 mg | INHALATION_SOLUTION | Freq: Once | RESPIRATORY_TRACT | Status: AC
  Administered 2015-01-31: 2.5 mg via RESPIRATORY_TRACT
  Filled 2015-01-31: qty 3

## 2015-01-31 NOTE — ED Provider Notes (Signed)
CSN: [id number]     Arrival date & time 01/31/15  1422 History   First MD Initiated Contact with Patient 01/31/15 1512     Chief Complaint  Patient presents with  . Shortness of Breath     (Consider location/radiation/quality/duration/timing/severity/associated sxs/prior Treatment) Patient is a 60 y.o. male presenting with shortness of breath. The history is provided by the patient (The patient complains of a cough with yellow sputum production and increased shortness of breath. He is usually on 4 L for COPD and states that when he gets up and walks around his sats drop).  Shortness of Breath Severity:  Moderate Onset quality:  Sudden Timing:  Constant Progression:  Worsening Chronicity:  Recurrent Context: activity   Associated symptoms: no abdominal pain, no chest pain, no cough, no headaches and no rash     Past Medical History  Diagnosis Date  . Tobacco abuse     40 Pk-yr (1- 1 1/2 PPD) --> Quit 11/18/2012 when presented with STEMI  . COPD (chronic obstructive pulmonary disease) ([place])   . STEMI of inferior wall 11/18/12- RCA BMS 11/17/2012    Cardiogenic shock - post MI with RV infarction [785.51]  . CAD S/P percutaneous coronary angioplasty  11/18/2012    - PCI mid RCA - 5.0 mm x 24 mm VeriFlex BMS (5.6 mm)  . Atrial fibrillation with RVR - peri-MI; recurrent after DCCV 11/19/2012  . Ischemic cardiomyopathy 11/19/2012    EF 40-45% with inferoseptal HK, Gr 2DD - high LVEDP/LAP. --> confirmed by f/u Echo 03/2013.  [name] Dyslipidemia- low HDL 11/19/2012  . Chronic combined systolic and diastolic heart failure, NYHA class 2 ([place]) 11/17/2012  . Colon polyp     Status post polypectomy x3.   Past Surgical History  Procedure Laterality Date  . Dental extractions    . Coronary angioplasty with stent placement Right 11/17/12    Mid RCA aspiration thrombectomy and PCI with VeriFlex BMS 5.0 mm x 24 mm (5.6 mm; also PTCA of distal RPL 4 distal embolization.  . Transthoracic  echocardiogram  11/18/2012    Normal LV size, moderate reduced function 40-45% with severe HK-AK of the inferoseptal wall with incoordinate septal motion.  . Transthoracic echocardiogram  March 15    Moderately reduced LVEF of 40 at 45% with inferoseptal hypokinesis and high filling pressures.  . Colonoscopy N/A 11/20/2013    Procedure: COLONOSCOPY;  Surgeon: Jamesetta So, MD;  Location: [facility];  Service: Gastroenterology;  Laterality: N/A;  . Left heart catheterization with coronary angiogram N/A 11/17/2012    Procedure: LEFT HEART CATHETERIZATION WITH CORONARY ANGIOGRAM;  Surgeon: Leonie Man, MD;  Location: [facility] CATH LAB;  Service: Cardiovascular;  Laterality: N/A;   Family History  Problem Relation Age of Onset  . Heart attack Mother   . Heart attack Father   . Cancer Sister    Social History  Substance Use Topics  . Smoking status: Former Smoker -- 1.00 packs/day for 40 years  . Smokeless tobacco: Never Used     Comment: [place] 10/2102  . Alcohol Use: No    Review of Systems  Constitutional: Negative for appetite change and fatigue.  HENT: Negative for congestion, ear discharge and sinus pressure.   Eyes: Negative for discharge.  Respiratory: Positive for shortness of breath. Negative for cough.   Cardiovascular: Negative for chest pain.  Gastrointestinal: Negative for abdominal pain and diarrhea.  Genitourinary: Negative for frequency and hematuria.  Musculoskeletal: Negative for back pain.  Skin: Negative for rash.  Neurological: Negative for seizures and headaches.  Psychiatric/Behavioral: Negative for hallucinations.      Allergies  Codeine and Penicillins  Home Medications   Prior to Admission medications   Medication Sig Start Date End Date Taking? Authorizing Provider  acetaminophen (TYLENOL) 500 MG tablet Take by mouth every 6 (six) hours as needed for pain.   Yes Historical Provider, MD  albuterol (PROVENTIL HFA;VENTOLIN  HFA) 108 (90 BASE) MCG/ACT inhaler Inhale 2 puffs into the lungs every 6 (six) hours as needed for wheezing.   Yes Historical Provider, MD  albuterol (PROVENTIL) (2.5 MG/3ML) 0.083% nebulizer solution Inhale 0.083 mLs into the lungs 4 (four) times daily. 12/11/14  Yes Historical Provider, MD  apixaban (ELIQUIS) 5 MG TABS tablet Take 1 tablet (5 mg total) by mouth 2 (two) times daily. 03/05/14  Yes Leonie Man, MD  atorvastatin (LIPITOR) 40 MG tablet Take 1 tablet (40 mg total) by mouth daily. 06/10/14  Yes Leonie Man, MD  bisoprolol (ZEBETA) 5 MG tablet Take 0.5 tablets (2.5 mg total) by mouth daily. 06/10/14  Yes Leonie Man, MD  clopidogrel (PLAVIX) 75 MG tablet TAKE 1 TABLET BY MOUTH EVERY DAY 04/12/14  Yes Leonie Man, MD  Coenzyme Q10 (CO Q-10) 100 MG CAPS Take 300 mg by mouth daily.   Yes Historical Provider, MD  DALIRESP 500 MCG TABS tablet Take 500 mcg by mouth daily.  08/04/13  Yes Historical Provider, MD  Dextromethorphan-Guaifenesin (MUCINEX DM MAXIMUM STRENGTH PO) Take by mouth every 4 (four) hours as needed.   Yes Historical Provider, MD  furosemide (LASIX) 40 MG tablet TAKE 1 TABLET TO 2 TABLET AS NEEDED IN THE MORNING  AND 1 TABLET IN THE EVENING 01/04/15  Yes Leonie Man, MD  gabapentin (NEURONTIN) 300 MG capsule Take 600 mg by mouth 4 (four) times daily.   Yes Historical Provider, MD  ipratropium (ATROVENT) 0.06 % nasal spray 1 spray as needed.  11/17/13  Yes Historical Provider, MD  losartan (COZAAR) 50 MG tablet Take 1 tablet (50 mg total) by mouth daily. 06/10/14  Yes Leonie Man, MD  nitroGLYCERIN (NITROSTAT) 0.4 MG SL tablet Place 1 tablet (0.4 mg total) under the tongue every 5 (five) minutes x 3 doses as needed for chest pain. 11/22/12  Yes Brittainy Erie Noe, PA-C  OXYGEN-HELIUM IN Inhale into the lungs. Using 4 liters nasal cannula ,continuous   Yes Historical Provider, MD  potassium chloride (K-DUR) 10 MEQ tablet Take 10 mEq by mouth daily.   Yes Historical  Provider, MD  SPIRIVA HANDIHALER 18 MCG inhalation capsule Place 18 mcg into inhaler and inhale daily.  06/20/13  Yes Historical Provider, MD  SYMBICORT 160-4.5 MCG/ACT inhaler Inhale 2 puffs into the lungs 2 (two) times daily.  06/18/13  Yes Historical Provider, MD   BP 144/75 mmHg  Pulse 81  Temp(Src) 98.4 F (36.9 C) (Oral)  Resp 19  Ht 5\' 11"  (1.803 m)  Wt 229 lb (103.874 kg)  BMI 31.95 kg/m2  SpO2 94% Physical Exam  Constitutional: He is oriented to person, place, and time. He appears well-developed.  HENT:  Head: Normocephalic.  Eyes: Conjunctivae and EOM are normal. No scleral icterus.  Neck: Neck supple. No thyromegaly present.  Cardiovascular: Normal rate and regular rhythm.  Exam reveals no gallop and no friction rub.   No murmur heard. Pulmonary/Chest: No stridor. He has wheezes. He has no rales. He exhibits no tenderness.  Abdominal: He exhibits no distension.  There is no tenderness. There is no rebound.  Musculoskeletal: Normal range of motion. He exhibits no edema.  Lymphadenopathy:    He has no cervical adenopathy.  Neurological: He is oriented to person, place, and time. He exhibits normal muscle tone. Coordination normal.  Skin: No rash noted. No erythema.  Psychiatric: He has a normal mood and affect. His behavior is normal.    ED Course  Procedures (including critical care time) Labs Review Labs Reviewed  CBC WITH DIFFERENTIAL/PLATELET - Abnormal; Notable for the following:    RBC 4.00 (*)    Hemoglobin 11.9 (*)    HCT 38.0 (*)    Monocytes Absolute 1.5 (*)    All other components within normal limits  COMPREHENSIVE METABOLIC PANEL  BRAIN NATRIURETIC PEPTIDE  I-STAT TROPOININ, ED    Imaging Review Dg Chest 2 View  01/31/2015  CLINICAL DATA:  Increased shortness breath and productive cough for 4 days. COPD. EXAM: CHEST  2 VIEW COMPARISON:  07/05/2013. FINDINGS: Severe COPD. Biapical lung cysts results in compression of the lower lung markings. No active  infiltrates or failure. Cardiomegaly. Osteopenia. Thoracic atherosclerosis. IMPRESSION: No active cardiopulmonary disease. Electronically Signed   By: Staci Righter M.D.   On: 01/31/2015 15:22   I have personally reviewed and evaluated these images and lab results as part of my medical decision-making.   EKG Interpretation   Date/Time:  Monday January 31 2015 14:31:05 EST Ventricular Rate:  87 PR Interval:  175 QRS Duration: 113 QT Interval:  383 QTC Calculation: 461 R Axis:   49 Text Interpretation:  Sinus rhythm Incomplete right bundle branch block  Inferior infarct, old Baseline wander in lead(s) V3 Confirmed by Reneka Nebergall   MD, Neely Cecena [phone]) on 01/31/2015 5:53:48 PM      MDM   Final diagnoses:  None    COPD exacerbation patient will be admitted to telemetry    Milton Ferguson, MD 01/31/15 1823

## 2015-01-31 NOTE — ED Notes (Signed)
MD Zammit at bedside updating patient and family.  

## 2015-01-31 NOTE — ED Notes (Signed)
Pt reports increasing SOB since Thurs.

## 2015-01-31 NOTE — Progress Notes (Signed)

## 2015-01-31 NOTE — ED Notes (Signed)
Attempted report x1. 

## 2015-01-31 NOTE — ED Notes (Signed)
Pt given graham crackers and peanut butter and coffee per request.

## 2015-01-31 NOTE — H&P (Signed)
PCP:   [name], MD   Chief Complaint:  Sob, wheezing  HPI: 60 yo male h/o copd on 4 liter [place] at home, CAD, chronic CHF come in with URI symptoms since Thursday a lot of nasal congestion and many sick contacts with worsening sob and wheezing.  No fevers.  No swelling in legs.  No n/v/d.  Gets very sob with any exertion which is worse than normal.  On arrival pt was wheezing given several albuterol nebs and iv solumedrol and feels better but not back to baseline.  He has been taking mucinex since sat which he says has helped with his congestion.  Referred for admission for copde.  Review of Systems:  Positive and negative as per HPI otherwise all other systems are negative  Past Medical History: Past Medical History  Diagnosis Date  . Tobacco abuse     40 Pk-yr (1- 1 1/2 PPD) --> Quit 11/18/2012 when presented with STEMI  . COPD (chronic obstructive pulmonary disease) ([place])   . STEMI of inferior wall 11/18/12- RCA BMS 11/17/2012    Cardiogenic shock - post MI with RV infarction [785.51]  . CAD S/P percutaneous coronary angioplasty  11/18/2012    - PCI mid RCA - 5.0 mm x 24 mm VeriFlex BMS (5.6 mm)  . Atrial fibrillation with RVR - peri-MI; recurrent after DCCV 11/19/2012  . Ischemic cardiomyopathy 11/19/2012    EF 40-45% with inferoseptal HK, Gr 2DD - high LVEDP/LAP. --> confirmed by f/u Echo 03/2013.  [name] Dyslipidemia- low HDL 11/19/2012  . Chronic combined systolic and diastolic heart failure, NYHA class 2 ([place]) 11/17/2012  . Colon polyp     Status post polypectomy x3.   Past Surgical History  Procedure Laterality Date  . Dental extractions    . Coronary angioplasty with stent placement Right 11/17/12    Mid RCA aspiration thrombectomy and PCI with VeriFlex BMS 5.0 mm x 24 mm (5.6 mm; also PTCA of distal RPL 4 distal embolization.  . Transthoracic echocardiogram  11/18/2012    Normal LV size, moderate reduced function 40-45% with severe HK-AK of the inferoseptal wall with  incoordinate septal motion.  . Transthoracic echocardiogram  March 15    Moderately reduced LVEF of 40 at 45% with inferoseptal hypokinesis and high filling pressures.  . Colonoscopy N/A 11/20/2013    Procedure: COLONOSCOPY;  Surgeon: Jamesetta So, MD;  Location: [facility];  Service: Gastroenterology;  Laterality: N/A;  . Left heart catheterization with coronary angiogram N/A 11/17/2012    Procedure: LEFT HEART CATHETERIZATION WITH CORONARY ANGIOGRAM;  Surgeon: Leonie Man, MD;  Location: [facility] CATH LAB;  Service: Cardiovascular;  Laterality: N/A;    Medications: Prior to Admission medications   Medication Sig Start Date End Date Taking? Authorizing Provider  acetaminophen (TYLENOL) 500 MG tablet Take by mouth every 6 (six) hours as needed for pain.   Yes Historical Provider, MD  albuterol (PROVENTIL HFA;VENTOLIN HFA) 108 (90 BASE) MCG/ACT inhaler Inhale 2 puffs into the lungs every 6 (six) hours as needed for wheezing.   Yes Historical Provider, MD  albuterol (PROVENTIL) (2.5 MG/3ML) 0.083% nebulizer solution Inhale 0.083 mLs into the lungs 4 (four) times daily. 12/11/14  Yes Historical Provider, MD  apixaban (ELIQUIS) 5 MG TABS tablet Take 1 tablet (5 mg total) by mouth 2 (two) times daily. 03/05/14  Yes Leonie Man, MD  atorvastatin (LIPITOR) 40 MG tablet Take 1 tablet (40 mg total) by mouth daily. 06/10/14  Yes Leonie Man, MD  bisoprolol (ZEBETA) 5 MG tablet Take 0.5 tablets (2.5 mg total) by mouth daily. 06/10/14  Yes Leonie Man, MD  clopidogrel (PLAVIX) 75 MG tablet TAKE 1 TABLET BY MOUTH EVERY DAY 04/12/14  Yes Leonie Man, MD  Coenzyme Q10 (CO Q-10) 100 MG CAPS Take 300 mg by mouth daily.   Yes Historical Provider, MD  DALIRESP 500 MCG TABS tablet Take 500 mcg by mouth daily.  08/04/13  Yes Historical Provider, MD  Dextromethorphan-Guaifenesin (MUCINEX DM MAXIMUM STRENGTH PO) Take by mouth every 4 (four) hours as needed.   Yes Historical Provider, MD  furosemide  (LASIX) 40 MG tablet TAKE 1 TABLET TO 2 TABLET AS NEEDED IN THE MORNING  AND 1 TABLET IN THE EVENING 01/04/15  Yes Leonie Man, MD  gabapentin (NEURONTIN) 300 MG capsule Take 600 mg by mouth 4 (four) times daily.   Yes Historical Provider, MD  ipratropium (ATROVENT) 0.06 % nasal spray 1 spray as needed.  11/17/13  Yes Historical Provider, MD  losartan (COZAAR) 50 MG tablet Take 1 tablet (50 mg total) by mouth daily. 06/10/14  Yes Leonie Man, MD  nitroGLYCERIN (NITROSTAT) 0.4 MG SL tablet Place 1 tablet (0.4 mg total) under the tongue every 5 (five) minutes x 3 doses as needed for chest pain. 11/22/12  Yes Brittainy Erie Noe, PA-C  OXYGEN-HELIUM IN Inhale into the lungs. Using 4 liters nasal cannula ,continuous   Yes Historical Provider, MD  potassium chloride (K-DUR) 10 MEQ tablet Take 10 mEq by mouth daily.   Yes Historical Provider, MD  SPIRIVA HANDIHALER 18 MCG inhalation capsule Place 18 mcg into inhaler and inhale daily.  06/20/13  Yes Historical Provider, MD  SYMBICORT 160-4.5 MCG/ACT inhaler Inhale 2 puffs into the lungs 2 (two) times daily.  06/18/13  Yes Historical Provider, MD    Allergies:   Allergies  Allergen Reactions  . Codeine     Red blotches  . Penicillins     Childhood reaction    Social History:  reports that he has quit smoking. He has never used smokeless tobacco. He reports that he does not drink alcohol or use illicit drugs.  Family History: Family History  Problem Relation Age of Onset  . Heart attack Mother   . Heart attack Father   . Cancer Sister     Physical Exam: Filed Vitals:   01/31/15 1800 01/31/15 1830 01/31/15 1900 01/31/15 1913  BP: 113/77 115/66 102/72   Pulse: 84 86 87   Temp:    99.1 F (37.3 C)  TempSrc:    Oral  Resp: 20 17 24    Height:      Weight:      SpO2: 93% 94% 94%    General appearance: alert, cooperative and mild distress Head: Normocephalic, without obvious abnormality, atraumatic Eyes: negative Nose: Nares  normal. Septum midline. Mucosa normal. No drainage or sinus tenderness. Neck: no JVD and supple, symmetrical, trachea midline Lungs: diminished breath sounds bilaterally Heart: regular rate and rhythm, S1, S2 normal, no murmur, click, rub or gallop Abdomen: soft, non-tender; bowel sounds normal; no masses,  no organomegaly Extremities: extremities normal, atraumatic, no cyanosis or edema Pulses: 2+ and symmetric Skin: Skin color, texture, turgor normal. No rashes or lesions Neurologic: Grossly normal    Labs on Admission:   Recent Labs  01/31/15 1445  NA 140  K 4.5  CL 97*  CO2 35*  GLUCOSE 114*  BUN 17  CREATININE 0.97  CALCIUM 8.9    Recent  Labs  01/31/15 1445  AST 16  ALT 19  ALKPHOS 75  BILITOT 0.5  PROT 7.7  ALBUMIN 3.6    Recent Labs  01/31/15 1445  WBC 9.6  NEUTROABS 6.8  HGB 11.9*  HCT 38.0*  MCV 95.0  PLT 355    Radiological Exams on Admission: Dg Chest 2 View  01/31/2015  CLINICAL DATA:  Increased shortness breath and productive cough for 4 days. COPD. EXAM: CHEST  2 VIEW COMPARISON:  07/05/2013. FINDINGS: Severe COPD. Biapical lung cysts results in compression of the lower lung markings. No active infiltrates or failure. Cardiomegaly. Osteopenia. Thoracic atherosclerosis. IMPRESSION: No active cardiopulmonary disease. Electronically Signed   By: Staci Righter M.D.   On: 01/31/2015 15:22   Old chart reviewed Case discussed with dr zammit  Assessment/Plan  60 yo male with acute copde  Principal Problem:   COPD exacerbation ([place])-  Likely due to underling URI.  Place on mucinex dm, freq nebs.  Iv solumedrol.  Zpack.    Active Problems:   COPD (chronic obstructive pulmonary disease) - Now on Home O2- noted, as above   CAD S/P percutaneous coronary angioplasty - PCI mid RCA - 5.0 mm x 24 mm VeriFlex BMS (5.6 mm)- noted   Cardiomyopathy, ischemic - EF 40-45%- noted, no ivf at this time   DOE (dyspnea on exertion)- due to above   Chronic combined  systolic and diastolic heart failure, NYHA class 2 ([place])   Essential hypertension  Admit to tele.  Full code.    Zarin Knupp A 01/31/2015, 7:18 PM

## 2015-02-01 DIAGNOSIS — I2510 Atherosclerotic heart disease of native coronary artery without angina pectoris: Secondary | ICD-10-CM

## 2015-02-01 DIAGNOSIS — Z9861 Coronary angioplasty status: Secondary | ICD-10-CM

## 2015-02-01 LAB — BASIC METABOLIC PANEL
Anion gap: 10 (ref 5–15)
BUN: 20 mg/dL (ref 6–20)
CHLORIDE: 97 mmol/L — AB (ref 101–111)
CO2: 32 mmol/L (ref 22–32)
CREATININE: 0.86 mg/dL (ref 0.61–1.24)
Calcium: 9.1 mg/dL (ref 8.9–10.3)
GFR calc Af Amer: >60 mL/min (ref >60)
GLUCOSE: 170 mg/dL — AB (ref 65–99)
POTASSIUM: 4.4 mmol/L (ref 3.5–5.1)
Sodium: 139 mmol/L (ref 135–145)

## 2015-02-01 LAB — CBC
HEMATOCRIT: 37.5 % — AB (ref 39.0–52.0)
Hemoglobin: 11.5 g/dL — ABNORMAL LOW (ref 13.0–17.0)
MCH: 29.1 pg (ref 26.0–34.0)
MCHC: 30.7 g/dL (ref 30.0–36.0)
MCV: 94.9 fL (ref 78.0–100.0)
PLATELETS: 411 10*3/uL — AB (ref 150–400)
RBC: 3.95 MIL/uL — ABNORMAL LOW (ref 4.22–5.81)
RDW: 14.5 % (ref 11.5–15.5)
WBC: 7.8 10*3/uL (ref 4.0–10.5)

## 2015-02-01 MED ORDER — POTASSIUM CHLORIDE CRYS ER 10 MEQ PO TBCR
10.0000 meq | EXTENDED_RELEASE_TABLET | Freq: Every day | ORAL | Status: DC
Start: 2015-02-01 — End: 2015-02-02
  Administered 2015-02-01 – 2015-02-02 (×2): 10 meq via ORAL
  Filled 2015-02-01 (×2): qty 1

## 2015-02-01 MED ORDER — BISOPROLOL FUMARATE 5 MG PO TABS
2.5000 mg | ORAL_TABLET | Freq: Every day | ORAL | Status: DC
  Administered 2015-02-01 – 2015-02-02 (×2): 2.5 mg via ORAL
  Filled 2015-02-01 (×4): qty 0.5

## 2015-02-01 NOTE — Progress Notes (Signed)
[facility] PROGRESS NOTE  Benjamin [name] [id number] DOB: 07-29-1955 DOA: 01/31/2015 PCP: [name], MD  Assessment/Plan: COPD with acute exacerbation -Influenza PCR negative, likely precipitated by upper respiratory infection given multiple sick family contacts. -Continue steroids at current dose, nebs, antibiotics. -Patient feels improved today.  Acute on chronic respiratory failure -Secondary to COPD with acute exacerbation. -Continue oxygen supplementation as necessary.  History of coronary artery disease/ischemic cardiomyopathy -Status post stenting. -Continue Plavix.  Atrial fibrillation -Currently in sinus rhythm. -Anticoagulated on Eliquis.  Code Status: Full code Family Communication: Patient only  Disposition Plan: Home when ready   Consultants:  None   Antibiotics:  Azithromycin   Subjective: Shortness of breath improving, still dyspnea on exertion, chest pain  Objective: Filed Vitals:   02/01/15 0231 02/01/15 0500 02/01/15 0515 02/01/15 0747  BP:   121/80   Pulse:   77 82  Temp:   97.8 F (36.6 C)   TempSrc:   Oral   Resp:    17  Height:      Weight:  105.325 kg (232 lb 3.2 oz)    SpO2: 92%  96% 93%    Intake/Output Summary (Last 24 hours) at 02/01/15 1156 Last data filed at 02/01/15 0859  Gross per 24 hour  Intake    462 ml  Output    450 ml  Net     12 ml   Filed Weights   01/31/15 1433 01/31/15 2019 02/01/15 0500  Weight: 103.874 kg (229 lb) 105.144 kg (231 lb 12.8 oz) 105.325 kg (232 lb 3.2 oz)    Exam:   General:  Alert, awake, oriented 3, no current distress  Cardiovascular: Regular rate and rhythm, systolic ejection murmur  Respiratory: Fair air movement, only mild, scant expiratory wheezes  Abdomen: Soft, nontender, nondistended, positive bowel sounds  Extremities: Trace bilateral edema bilaterally   Neurologic:  Grossly intact and nonfocal  Data Reviewed: Basic Metabolic Panel:  Recent  Labs Lab 01/31/15 1445 02/01/15 0552  NA 140 139  K 4.5 4.4  CL 97* 97*  CO2 35* 32  GLUCOSE 114* 170*  BUN 17 20  CREATININE 0.97 0.86  CALCIUM 8.9 9.1   Liver Function Tests:  Recent Labs Lab 01/31/15 1445  AST 16  ALT 19  ALKPHOS 75  BILITOT 0.5  PROT 7.7  ALBUMIN 3.6   No results for input(s): LIPASE, AMYLASE in the last 168 hours. No results for input(s): AMMONIA in the last 168 hours. CBC:  Recent Labs Lab 01/31/15 1445 02/01/15 0552  WBC 9.6 7.8  NEUTROABS 6.8  --   HGB 11.9* 11.5*  HCT 38.0* 37.5*  MCV 95.0 94.9  PLT 355 411*   Cardiac Enzymes: No results for input(s): CKTOTAL, CKMB, CKMBINDEX, TROPONINI in the last 168 hours. BNP (last 3 results)  Recent Labs  01/31/15 1445  BNP 63.0    ProBNP (last 3 results) No results for input(s): PROBNP in the last 8760 hours.  CBG: No results for input(s): GLUCAP in the last 168 hours.  No results found for this or any previous visit (from the past 240 hour(s)).   Studies: Dg Chest 2 View  01/31/2015  CLINICAL DATA:  Increased shortness breath and productive cough for 4 days. COPD. EXAM: CHEST  2 VIEW COMPARISON:  07/05/2013. FINDINGS: Severe COPD. Biapical lung cysts results in compression of the lower lung markings. No active infiltrates or failure. Cardiomegaly. Osteopenia. Thoracic atherosclerosis. IMPRESSION: No active cardiopulmonary disease. Electronically Signed   By: Jenny Reichmann  Alfonse Flavors M.D.   On: 01/31/2015 15:22    Scheduled Meds: . apixaban  5 mg Oral BID  . atorvastatin  40 mg Oral q1800  . azithromycin  250 mg Oral QHS  . bisoprolol  2.5 mg Oral Daily  . clopidogrel  75 mg Oral Daily  . gabapentin  600 mg Oral QID  . ipratropium-albuterol  3 mL Nebulization Q6H  . losartan  50 mg Oral Daily  . methylPREDNISolone (SOLU-MEDROL) injection  80 mg Intravenous Q12H  . potassium chloride  10 mEq Oral Daily  . roflumilast  500 mcg Oral Daily  . sodium chloride  3 mL Intravenous Q12H  . sodium  chloride  3 mL Intravenous Q12H   Continuous Infusions:   Principal Problem:   COPD exacerbation (H[facility]) Active Problems:   COPD (chronic obstructive pulmonary disease) - Now on Home O2   CAD S/P percutaneous coronary angioplasty - PCI mid RCA - 5.0 mm x 24 mm VeriFlex BMS (5.6 mm)   Cardiomyopathy, ischemic - EF 40-45%   DOE (dyspnea on exertion)   Chronic combined systolic and diastolic heart failure, NYHA class 2 (HCC)   Essential hypertension    Time spent: 25 minutes. Greater than 50% of this time was spent in direct contact with the patient coordinating care.    Lelon Frohlich  [facility] Pager [phone]  If 7PM-7AM, please contact night-coverage at [email or web address], password [facility] 02/01/2015, 11:56 AM  LOS: 1 day

## 2015-02-01 NOTE — Care Management Note (Signed)
Case Management Note  Patient Details  Name: Benjamin [name] MRN: [id number] Date of Birth: 1955-04-18  Subjective/Objective:                  Pt admitted from home with COPD exacerbation. Pt lives with his son and will return home at discharge. Pt is independent with ADL's. Pt has home O2 with [facility] and pt also has a neb machine.  Action/Plan: No CM needs noted.  Expected Discharge Date:                  Expected Discharge Plan:  Home/Self Care  In-House Referral:  NA  Discharge planning Services  CM Consult  Post Acute Care Choice:  NA Choice offered to:  NA  DME Arranged:    DME Agency:     HH Arranged:    HH Agency:     Status of Service:  Completed, signed off  Medicare Important Message Given:    Date Medicare IM Given:    Medicare IM give by:    Date Additional Medicare IM Given:    Additional Medicare Important Message give by:     If discussed at [place] of Stay Meetings, dates discussed:    Additional Comments:  Joylene Draft, RN 02/01/2015, 12:17 PM

## 2015-02-02 DIAGNOSIS — J441 Chronic obstructive pulmonary disease with (acute) exacerbation: Principal | ICD-10-CM

## 2015-02-02 MED ORDER — PREDNISONE 50 MG PO TABS: ORAL_TABLET | ORAL | Status: DC

## 2015-02-02 MED ORDER — AZITHROMYCIN 250 MG PO TABS: ORAL_TABLET | ORAL | Status: DC

## 2015-02-02 NOTE — Care Management Note (Signed)
Case Management Note  Patient Details  Name: Benjamin [name] MRN: [id number] Date of Birth: [birth year]  Subjective/Objective:                    Action/Plan:   Expected Discharge Date:                  Expected Discharge Plan:  Home/Self Care  In-House Referral:  NA  Discharge planning Services  CM Consult  Post Acute Care Choice:  NA Choice offered to:  NA  DME Arranged:    DME Agency:     HH Arranged:    [place] Agency:     Status of Service:  Completed, signed off  Medicare Important Message Given:  N/A - LOS <3 / Initial given by admissions Date Medicare IM Given:    Medicare IM give by:    Date Additional Medicare IM Given:    Additional Medicare Important Message give by:     If discussed at [place] of Stay Meetings, dates discussed:    Additional Comments: Pt discharged home today. No CM needs noted. Pt has home O2 and neb machine for home use. Christinia Gully [place], RN 02/02/2015, 1:48 PM

## 2015-02-02 NOTE — Care Management Important Message (Signed)
Important Message  Patient Details  Name: BRODAN RAGOSTA MRN: [id number] Date of Birth: [birth year]   Medicare Important Message Given:  N/A - LOS <3 / Initial given by admissions    Joylene Draft, RN 02/02/2015, 1:46 PM

## 2015-02-02 NOTE — Discharge Instructions (Signed)

## 2015-02-02 NOTE — Discharge Summary (Signed)
Physician Discharge Summary  Benjamin [name] [id number] DOB: May 30, 1955 DOA: 01/31/2015  PCP: [name], MD  Admit date: 01/31/2015 Discharge date: 02/02/2015  Time spent: 35 minutes  Recommendations for Outpatient Follow-up:  1. burst of prednisone 50 mg daily given to and 02/10/15 2. Recommend outpatient follow-up either with primary care physician but also consider general surgery referral for infected sebaceous cyst 3. Should continue home oxygen 4 L 4. Continue Lasix as per home regimen and notify primary care physician if gains more than 2-3 pounds in a 24-hour period of time  5. stable for discharge home with home health   Discharge Diagnoses:  Principal Problem:   COPD exacerbation ([place]) Active Problems:   COPD (chronic obstructive pulmonary disease) - Now on Home O2   CAD S/P percutaneous coronary angioplasty - PCI mid RCA - 5.0 mm x 24 mm VeriFlex BMS (5.6 mm)   Cardiomyopathy, ischemic - EF 40-45%   DOE (dyspnea on exertion)   Chronic combined systolic and diastolic heart failure, NYHA class 2 ([place])   Essential hypertension   Discharge Condition: improved  Diet recommendation: heart healthy low-salt F2  Filed Weights   01/31/15 2019 02/01/15 0500 02/02/15 0530  Weight: 105.144 kg (231 lb 12.8 oz) 105.325 kg (232 lb 3.2 oz) 104.872 kg (231 lb 3.2 oz)    History of present illness:  60 year old male known history CAD, chronic CHF, COPD Admitted with wheezing and several albuterol nebs given Taking Mucinex but no relief Admitted for COPD   Hospital Course:  COPD with acute exacerbation -Influenza PCR negative, likely precipitated by upper respiratory infection given multiple sick family contacts. -Continue steroidsand transitioned from Solu-Medrol to prednisone 50 mg burst ending 1/12 -Patient feels improved today although still wheezy -also continue azithromycin 250 daily at bedtime ending1/6/17  Acute on chronic respiratory failure -Secondary to  COPD with acute exacerbation. -Continue oxygen supplementation as necessary.  History of coronary artery disease/ischemic cardiomyopathy -Status post stenting. -Continue Plavix.  Atrial fibrillation -Currently in sinus rhythm. -continue bisoprolol 2.5 twice a day -Anticoagulated on Eliquis.    Discharge Exam: Filed Vitals:   02/02/15 0530 02/02/15 0729  BP: 119/79   Pulse: 77 77  Temp: 98.2 F (36.8 C)   Resp: 21 18    General: alert pleasant oriented states he is improved Cardiovascular: S1-S2 no murmur rub or gallop Respiratory: slight wheeze bilaterally posteriorly  Discharge Instructions   Discharge Instructions    Diet - low sodium heart healthy    Complete by:  As directed      Discharge instructions    Complete by:  As directed   Complete steroid burst 02/08/15 Continue Oxygen as an OP Get CXR in 1 month Get follow up with Dr. Arnoldo Morale for your sebaceous cyst Take lasix daily 1 tablet     Increase activity slowly    Complete by:  As directed           Current Discharge Medication List    START taking these medications   Details  predniSONE (DELTASONE) 50 MG tablet Take 1 tablet of prednisone daily for the next 7 days and thens top Qty: 7 tablet, Refills: 0      CONTINUE these medications which have NOT CHANGED   Details  acetaminophen (TYLENOL) 500 MG tablet Take by mouth every 6 (six) hours as needed for pain.    albuterol (PROVENTIL HFA;VENTOLIN HFA) 108 (90 BASE) MCG/ACT inhaler Inhale 2 puffs into the lungs every 6 (six) hours as needed for wheezing.  albuterol (PROVENTIL) (2.5 MG/3ML) 0.083% nebulizer solution Inhale 0.083 mLs into the lungs 4 (four) times daily.    apixaban (ELIQUIS) 5 MG TABS tablet Take 1 tablet (5 mg total) by mouth 2 (two) times daily. Qty: 60 tablet, Refills: 6    atorvastatin (LIPITOR) 40 MG tablet Take 1 tablet (40 mg total) by mouth daily. Qty: 30 tablet, Refills: 11    bisoprolol (ZEBETA) 5 MG tablet Take 0.5  tablets (2.5 mg total) by mouth daily. Qty: 15 tablet, Refills: 11   Associated Diagnoses: Chronic combined systolic and diastolic heart failure, NYHA class 2 ([place]); DOE (dyspnea on exertion); Dyslipidemia; Cardiomyopathy, ischemic    clopidogrel (PLAVIX) 75 MG tablet TAKE 1 TABLET BY MOUTH EVERY DAY Qty: 30 tablet, Refills: 10    Coenzyme Q10 (CO Q-10) 100 MG CAPS Take 300 mg by mouth daily.    DALIRESP 500 MCG TABS tablet Take 500 mcg by mouth daily.     Dextromethorphan-Guaifenesin (MUCINEX DM MAXIMUM STRENGTH PO) Take by mouth every 4 (four) hours as needed.    furosemide (LASIX) 40 MG tablet TAKE 1 TABLET TO 2 TABLET AS NEEDED IN THE MORNING  AND 1 TABLET IN THE EVENING Qty: 90 tablet, Refills: 11    gabapentin (NEURONTIN) 300 MG capsule Take 600 mg by mouth 4 (four) times daily.    ipratropium (ATROVENT) 0.06 % nasal spray 1 spray as needed.     losartan (COZAAR) 50 MG tablet Take 1 tablet (50 mg total) by mouth daily. Qty: 30 tablet, Refills: 11   Associated Diagnoses: Chronic combined systolic and diastolic heart failure, NYHA class 2 ([place]); DOE (dyspnea on exertion); Dyslipidemia; Cardiomyopathy, ischemic    nitroGLYCERIN (NITROSTAT) 0.4 MG SL tablet Place 1 tablet (0.4 mg total) under the tongue every 5 (five) minutes x 3 doses as needed for chest pain. Qty: 25 tablet, Refills: 2    OXYGEN-HELIUM IN Inhale into the lungs. Using 4 liters nasal cannula ,continuous    potassium chloride (K-DUR) 10 MEQ tablet Take 10 mEq by mouth daily.    SPIRIVA HANDIHALER 18 MCG inhalation capsule Place 18 mcg into inhaler and inhale daily.     SYMBICORT 160-4.5 MCG/ACT inhaler Inhale 2 puffs into the lungs 2 (two) times daily.        Allergies  Allergen Reactions  . Codeine     Red blotches  . Penicillins     Childhood reaction   Follow-up Information    Schedule an appointment as soon as possible for a visit with [name], MD.   Specialty:  Internal Medicine    Contact information:   [address] [place] [id number] [phone]       Follow up with Jamesetta So, MD In 1 week.   Specialty:  General Surgery   Why:  for Sebaceous cyst   Contact information:   [address] [place] [id number] [phone]        The results of significant diagnostics from this hospitalization (including imaging, microbiology, ancillary and laboratory) are listed below for reference.    Significant Diagnostic Studies: Dg Chest 2 View  01/31/2015  CLINICAL DATA:  Increased shortness breath and productive cough for 4 days. COPD. EXAM: CHEST  2 VIEW COMPARISON:  07/05/2013. FINDINGS: Severe COPD. Biapical lung cysts results in compression of the lower lung markings. No active infiltrates or failure. Cardiomegaly. Osteopenia. Thoracic atherosclerosis. IMPRESSION: No active cardiopulmonary disease. Electronically Signed   By: Staci Righter M.D.   On: 01/31/2015 15:22  Microbiology: No results found for this or any previous visit (from the past 240 hour(s)).   Labs: Basic Metabolic Panel:  Recent Labs Lab 01/31/15 1445 02/01/15 0552  NA 140 139  K 4.5 4.4  CL 97* 97*  CO2 35* 32  GLUCOSE 114* 170*  BUN 17 20  CREATININE 0.97 0.86  CALCIUM 8.9 9.1   Liver Function Tests:  Recent Labs Lab 01/31/15 1445  AST 16  ALT 19  ALKPHOS 75  BILITOT 0.5  PROT 7.7  ALBUMIN 3.6   No results for input(s): LIPASE, AMYLASE in the last 168 hours. No results for input(s): AMMONIA in the last 168 hours. CBC:  Recent Labs Lab 01/31/15 1445 02/01/15 0552  WBC 9.6 7.8  NEUTROABS 6.8  --   HGB 11.9* 11.5*  HCT 38.0* 37.5*  MCV 95.0 94.9  PLT 355 411*   Cardiac Enzymes: No results for input(s): CKTOTAL, CKMB, CKMBINDEX, TROPONINI in the last 168 hours. BNP: BNP (last 3 results)  Recent Labs  01/31/15 1445  BNP 63.0    ProBNP (last 3 results) No results for input(s): PROBNP in the last 8760 hours.  CBG: No results for  input(s): GLUCAP in the last 168 hours.     Signed:  Nita Sells MD  FACP  [facility] 02/02/2015, 11:55 AM

## 2015-02-08 DIAGNOSIS — I509 Heart failure, unspecified: Secondary | ICD-10-CM | POA: Diagnosis not present

## 2015-02-08 DIAGNOSIS — Z1389 Encounter for screening for other disorder: Secondary | ICD-10-CM | POA: Diagnosis not present

## 2015-02-08 DIAGNOSIS — L700 Acne vulgaris: Secondary | ICD-10-CM | POA: Diagnosis not present

## 2015-02-08 DIAGNOSIS — J449 Chronic obstructive pulmonary disease, unspecified: Secondary | ICD-10-CM | POA: Diagnosis not present

## 2015-02-08 DIAGNOSIS — Z23 Encounter for immunization: Secondary | ICD-10-CM | POA: Diagnosis not present

## 2015-02-14 DIAGNOSIS — J449 Chronic obstructive pulmonary disease, unspecified: Secondary | ICD-10-CM | POA: Diagnosis not present

## 2015-02-14 DIAGNOSIS — I4891 Unspecified atrial fibrillation: Secondary | ICD-10-CM | POA: Diagnosis not present

## 2015-02-14 DIAGNOSIS — I2510 Atherosclerotic heart disease of native coronary artery without angina pectoris: Secondary | ICD-10-CM | POA: Diagnosis not present

## 2015-02-14 DIAGNOSIS — I110 Hypertensive heart disease with heart failure: Secondary | ICD-10-CM | POA: Diagnosis not present

## 2015-02-25 DIAGNOSIS — J449 Chronic obstructive pulmonary disease, unspecified: Secondary | ICD-10-CM | POA: Diagnosis not present

## 2015-03-07 ENCOUNTER — Other Ambulatory Visit: Payer: Self-pay | Admitting: Cardiology

## 2015-03-07 NOTE — Telephone Encounter (Signed)
Rx request sent to pharmacy.  

## 2015-03-08 ENCOUNTER — Other Ambulatory Visit (HOSPITAL_COMMUNITY): Payer: Self-pay | Admitting: Pulmonary Disease

## 2015-03-08 ENCOUNTER — Ambulatory Visit (HOSPITAL_COMMUNITY)
Admission: RE | Admit: 2015-03-08 | Discharge: 2015-03-08 | Disposition: A | Discharge status: Home / Self Care | Payer: Medicare Other | Source: Ambulatory Visit | Attending: Pulmonary Disease | Admitting: Pulmonary Disease

## 2015-03-08 DIAGNOSIS — R0602 Shortness of breath: Secondary | ICD-10-CM | POA: Diagnosis not present

## 2015-03-08 DIAGNOSIS — J449 Chronic obstructive pulmonary disease, unspecified: Secondary | ICD-10-CM

## 2015-03-08 DIAGNOSIS — R05 Cough: Secondary | ICD-10-CM | POA: Diagnosis not present

## 2015-03-28 DIAGNOSIS — J449 Chronic obstructive pulmonary disease, unspecified: Secondary | ICD-10-CM | POA: Diagnosis not present

## 2015-04-14 DIAGNOSIS — I2510 Atherosclerotic heart disease of native coronary artery without angina pectoris: Secondary | ICD-10-CM | POA: Diagnosis not present

## 2015-04-14 DIAGNOSIS — I4891 Unspecified atrial fibrillation: Secondary | ICD-10-CM | POA: Diagnosis not present

## 2015-04-14 DIAGNOSIS — J449 Chronic obstructive pulmonary disease, unspecified: Secondary | ICD-10-CM | POA: Diagnosis not present

## 2015-04-14 DIAGNOSIS — I10 Essential (primary) hypertension: Secondary | ICD-10-CM | POA: Diagnosis not present

## 2015-04-15 ENCOUNTER — Other Ambulatory Visit (HOSPITAL_COMMUNITY): Payer: Self-pay | Admitting: Respiratory Therapy

## 2015-04-15 DIAGNOSIS — J4489 Other specified chronic obstructive pulmonary disease: Secondary | ICD-10-CM

## 2015-04-15 DIAGNOSIS — J449 Chronic obstructive pulmonary disease, unspecified: Secondary | ICD-10-CM

## 2015-04-22 ENCOUNTER — Encounter (HOSPITAL_COMMUNITY): Payer: Medicare Other

## 2015-04-25 DIAGNOSIS — J449 Chronic obstructive pulmonary disease, unspecified: Secondary | ICD-10-CM | POA: Diagnosis not present

## 2015-04-27 ENCOUNTER — Ambulatory Visit (HOSPITAL_COMMUNITY)
Admission: RE | Admit: 2015-04-27 | Discharge: 2015-04-27 | Disposition: A | Discharge status: Home / Self Care | Payer: Medicare Other | Source: Ambulatory Visit | Attending: Pulmonary Disease | Admitting: Pulmonary Disease

## 2015-04-27 DIAGNOSIS — J449 Chronic obstructive pulmonary disease, unspecified: Secondary | ICD-10-CM | POA: Insufficient documentation

## 2015-04-27 LAB — BLOOD GAS, ARTERIAL
Acid-Base Excess: 9.5 mmol/L — ABNORMAL HIGH (ref 0.0–2.0)
Bicarbonate: 32.1 mEq/L — ABNORMAL HIGH (ref 20.0–24.0)
FIO2: 0.21
O2 Saturation: 81.7 %
PCO2 ART: 51.6 mmHg — AB (ref 35.0–45.0)
PH ART: 7.435 (ref 7.350–7.450)
PO2 ART: 48.4 mmHg — AB (ref 80.0–100.0)

## 2015-04-30 HISTORY — PX: TRANSTHORACIC ECHOCARDIOGRAM: SHX275

## 2015-05-06 ENCOUNTER — Other Ambulatory Visit: Payer: Self-pay | Admitting: Cardiology

## 2015-05-06 NOTE — Telephone Encounter (Signed)
Rx request sent to pharmacy.  

## 2015-05-26 ENCOUNTER — Inpatient Hospital Stay (HOSPITAL_COMMUNITY): Payer: Medicare Other

## 2015-05-26 ENCOUNTER — Emergency Department (HOSPITAL_COMMUNITY): Payer: Medicare Other

## 2015-05-26 ENCOUNTER — Encounter (HOSPITAL_COMMUNITY): Payer: Self-pay

## 2015-05-26 ENCOUNTER — Inpatient Hospital Stay (HOSPITAL_COMMUNITY)
Admission: EM | Admit: 2015-05-26 | Discharge: 2015-06-06 | DRG: 207 | Disposition: A | Discharge status: Skilled Nursing Facility | Payer: Medicare Other | Attending: Internal Medicine | Admitting: Internal Medicine

## 2015-05-26 DIAGNOSIS — E119 Type 2 diabetes mellitus without complications: Secondary | ICD-10-CM | POA: Diagnosis not present

## 2015-05-26 DIAGNOSIS — I480 Paroxysmal atrial fibrillation: Secondary | ICD-10-CM | POA: Diagnosis not present

## 2015-05-26 DIAGNOSIS — J441 Chronic obstructive pulmonary disease with (acute) exacerbation: Secondary | ICD-10-CM | POA: Diagnosis not present

## 2015-05-26 DIAGNOSIS — Z9289 Personal history of other medical treatment: Secondary | ICD-10-CM

## 2015-05-26 DIAGNOSIS — D649 Anemia, unspecified: Secondary | ICD-10-CM | POA: Diagnosis present

## 2015-05-26 DIAGNOSIS — Z885 Allergy status to narcotic agent status: Secondary | ICD-10-CM

## 2015-05-26 DIAGNOSIS — J9811 Atelectasis: Secondary | ICD-10-CM | POA: Diagnosis not present

## 2015-05-26 DIAGNOSIS — I25111 Atherosclerotic heart disease of native coronary artery with angina pectoris with documented spasm: Secondary | ICD-10-CM | POA: Diagnosis not present

## 2015-05-26 DIAGNOSIS — J189 Pneumonia, unspecified organism: Secondary | ICD-10-CM | POA: Diagnosis present

## 2015-05-26 DIAGNOSIS — R0902 Hypoxemia: Secondary | ICD-10-CM | POA: Diagnosis not present

## 2015-05-26 DIAGNOSIS — J440 Chronic obstructive pulmonary disease with acute lower respiratory infection: Secondary | ICD-10-CM | POA: Diagnosis not present

## 2015-05-26 DIAGNOSIS — G9340 Encephalopathy, unspecified: Secondary | ICD-10-CM | POA: Diagnosis not present

## 2015-05-26 DIAGNOSIS — J9600 Acute respiratory failure, unspecified whether with hypoxia or hypercapnia: Secondary | ICD-10-CM | POA: Diagnosis not present

## 2015-05-26 DIAGNOSIS — J14 Pneumonia due to Hemophilus influenzae: Secondary | ICD-10-CM | POA: Diagnosis present

## 2015-05-26 DIAGNOSIS — T17990A Other foreign object in respiratory tract, part unspecified in causing asphyxiation, initial encounter: Secondary | ICD-10-CM | POA: Diagnosis not present

## 2015-05-26 DIAGNOSIS — R0602 Shortness of breath: Secondary | ICD-10-CM | POA: Diagnosis not present

## 2015-05-26 DIAGNOSIS — E785 Hyperlipidemia, unspecified: Secondary | ICD-10-CM | POA: Diagnosis not present

## 2015-05-26 DIAGNOSIS — A419 Sepsis, unspecified organism: Secondary | ICD-10-CM | POA: Diagnosis present

## 2015-05-26 DIAGNOSIS — Z79899 Other long term (current) drug therapy: Secondary | ICD-10-CM | POA: Diagnosis not present

## 2015-05-26 DIAGNOSIS — Z880 Allergy status to penicillin: Secondary | ICD-10-CM | POA: Diagnosis not present

## 2015-05-26 DIAGNOSIS — J9622 Acute and chronic respiratory failure with hypercapnia: Secondary | ICD-10-CM | POA: Diagnosis not present

## 2015-05-26 DIAGNOSIS — E875 Hyperkalemia: Secondary | ICD-10-CM | POA: Diagnosis not present

## 2015-05-26 DIAGNOSIS — J9601 Acute respiratory failure with hypoxia: Secondary | ICD-10-CM | POA: Diagnosis not present

## 2015-05-26 DIAGNOSIS — I5042 Chronic combined systolic (congestive) and diastolic (congestive) heart failure: Secondary | ICD-10-CM | POA: Diagnosis present

## 2015-05-26 DIAGNOSIS — I252 Old myocardial infarction: Secondary | ICD-10-CM

## 2015-05-26 DIAGNOSIS — Z789 Other specified health status: Secondary | ICD-10-CM | POA: Diagnosis not present

## 2015-05-26 DIAGNOSIS — R6521 Severe sepsis with septic shock: Secondary | ICD-10-CM | POA: Diagnosis not present

## 2015-05-26 DIAGNOSIS — E669 Obesity, unspecified: Secondary | ICD-10-CM | POA: Diagnosis present

## 2015-05-26 DIAGNOSIS — N179 Acute kidney failure, unspecified: Secondary | ICD-10-CM | POA: Diagnosis not present

## 2015-05-26 DIAGNOSIS — Z9981 Dependence on supplemental oxygen: Secondary | ICD-10-CM | POA: Diagnosis not present

## 2015-05-26 DIAGNOSIS — S37099A Other injury of unspecified kidney, initial encounter: Secondary | ICD-10-CM | POA: Diagnosis not present

## 2015-05-26 DIAGNOSIS — E1165 Type 2 diabetes mellitus with hyperglycemia: Secondary | ICD-10-CM | POA: Diagnosis not present

## 2015-05-26 DIAGNOSIS — Z9911 Dependence on respirator [ventilator] status: Secondary | ICD-10-CM

## 2015-05-26 DIAGNOSIS — I10 Essential (primary) hypertension: Secondary | ICD-10-CM | POA: Diagnosis not present

## 2015-05-26 DIAGNOSIS — G8929 Other chronic pain: Secondary | ICD-10-CM | POA: Diagnosis not present

## 2015-05-26 DIAGNOSIS — Z7951 Long term (current) use of inhaled steroids: Secondary | ICD-10-CM | POA: Diagnosis not present

## 2015-05-26 DIAGNOSIS — J9621 Acute and chronic respiratory failure with hypoxia: Secondary | ICD-10-CM | POA: Diagnosis not present

## 2015-05-26 DIAGNOSIS — I2510 Atherosclerotic heart disease of native coronary artery without angina pectoris: Secondary | ICD-10-CM | POA: Diagnosis not present

## 2015-05-26 DIAGNOSIS — Z8249 Family history of ischemic heart disease and other diseases of the circulatory system: Secondary | ICD-10-CM | POA: Diagnosis not present

## 2015-05-26 DIAGNOSIS — I255 Ischemic cardiomyopathy: Secondary | ICD-10-CM | POA: Diagnosis present

## 2015-05-26 DIAGNOSIS — Y95 Nosocomial condition: Secondary | ICD-10-CM | POA: Diagnosis present

## 2015-05-26 DIAGNOSIS — Z7901 Long term (current) use of anticoagulants: Secondary | ICD-10-CM

## 2015-05-26 DIAGNOSIS — Z6829 Body mass index (BMI) 29.0-29.9, adult: Secondary | ICD-10-CM | POA: Diagnosis not present

## 2015-05-26 DIAGNOSIS — Z978 Presence of other specified devices: Secondary | ICD-10-CM

## 2015-05-26 DIAGNOSIS — E441 Mild protein-calorie malnutrition: Secondary | ICD-10-CM | POA: Diagnosis not present

## 2015-05-26 DIAGNOSIS — J449 Chronic obstructive pulmonary disease, unspecified: Secondary | ICD-10-CM | POA: Diagnosis not present

## 2015-05-26 DIAGNOSIS — Z7902 Long term (current) use of antithrombotics/antiplatelets: Secondary | ICD-10-CM | POA: Diagnosis not present

## 2015-05-26 DIAGNOSIS — Z87891 Personal history of nicotine dependence: Secondary | ICD-10-CM | POA: Diagnosis not present

## 2015-05-26 DIAGNOSIS — Z955 Presence of coronary angioplasty implant and graft: Secondary | ICD-10-CM | POA: Diagnosis not present

## 2015-05-26 DIAGNOSIS — R0682 Tachypnea, not elsewhere classified: Secondary | ICD-10-CM | POA: Diagnosis not present

## 2015-05-26 DIAGNOSIS — F419 Anxiety disorder, unspecified: Secondary | ICD-10-CM | POA: Diagnosis present

## 2015-05-26 DIAGNOSIS — R062 Wheezing: Secondary | ICD-10-CM | POA: Diagnosis not present

## 2015-05-26 DIAGNOSIS — R918 Other nonspecific abnormal finding of lung field: Secondary | ICD-10-CM | POA: Diagnosis not present

## 2015-05-26 DIAGNOSIS — E872 Acidosis: Secondary | ICD-10-CM | POA: Diagnosis not present

## 2015-05-26 DIAGNOSIS — G9349 Other encephalopathy: Secondary | ICD-10-CM | POA: Diagnosis not present

## 2015-05-26 DIAGNOSIS — J9690 Respiratory failure, unspecified, unspecified whether with hypoxia or hypercapnia: Secondary | ICD-10-CM | POA: Diagnosis not present

## 2015-05-26 DIAGNOSIS — A413 Sepsis due to Hemophilus influenzae: Secondary | ICD-10-CM | POA: Diagnosis not present

## 2015-05-26 DIAGNOSIS — Z4682 Encounter for fitting and adjustment of non-vascular catheter: Secondary | ICD-10-CM | POA: Diagnosis not present

## 2015-05-26 DIAGNOSIS — J439 Emphysema, unspecified: Secondary | ICD-10-CM | POA: Diagnosis not present

## 2015-05-26 LAB — GLUCOSE, CAPILLARY: GLUCOSE-CAPILLARY: 133 mg/dL — AB (ref 65–99)

## 2015-05-26 LAB — CBC WITH DIFFERENTIAL/PLATELET
BASOS PCT: 0 %
Basophils Absolute: 0 10*3/uL (ref 0.0–0.1)
Eosinophils Absolute: 0 10*3/uL (ref 0.0–0.7)
Eosinophils Relative: 0 %
HEMATOCRIT: 36.4 % — AB (ref 39.0–52.0)
HEMOGLOBIN: 11.1 g/dL — AB (ref 13.0–17.0)
LYMPHS PCT: 7 %
Lymphs Abs: 1.2 10*3/uL (ref 0.7–4.0)
MCH: 28.7 pg (ref 26.0–34.0)
MCHC: 30.5 g/dL (ref 30.0–36.0)
MCV: 94.1 fL (ref 78.0–100.0)
MONO ABS: 2.3 10*3/uL — AB (ref 0.1–1.0)
Monocytes Relative: 12 %
NEUTROS ABS: 15.3 10*3/uL — AB (ref 1.7–7.7)
NEUTROS PCT: 81 %
Platelets: 373 10*3/uL (ref 150–400)
RBC: 3.87 MIL/uL — ABNORMAL LOW (ref 4.22–5.81)
RDW: 14.9 % (ref 11.5–15.5)
WBC: 18.8 10*3/uL — ABNORMAL HIGH (ref 4.0–10.5)

## 2015-05-26 LAB — BLOOD GAS, ARTERIAL
ACID-BASE EXCESS: 2.4 mmol/L — AB (ref 0.0–2.0)
Bicarbonate: 25.6 mEq/L — ABNORMAL HIGH (ref 20.0–24.0)
DELIVERY SYSTEMS: POSITIVE
Drawn by: 221791
Drawn by: 22179
Expiratory PAP: 8
FIO2: 100
FIO2: 0.5
Inspiratory PAP: 16
LHR: 8 {breaths}/min
O2 Content: 50 L/min
O2 SAT: 99.4 %
O2 SAT: 97 %
PATIENT TEMPERATURE: 37
PEEP/CPAP: 5 cmH2O
PH ART: 7.14 — AB (ref 7.350–7.450)
PH ART: 7.256 — AB (ref 7.350–7.450)
PO2 ART: 101 mmHg — AB (ref 80.0–100.0)
RATE: 14 resp/min
TCO2: 16.9 mmol/L (ref 0–100)
VT: 500 mL
pCO2 arterial: 67.3 mmHg (ref 35.0–45.0)
pO2, Arterial: 353 mmHg — ABNORMAL HIGH (ref 80.0–100.0)

## 2015-05-26 LAB — URINALYSIS, ROUTINE W REFLEX MICROSCOPIC
BILIRUBIN URINE: NEGATIVE
Glucose, UA: NEGATIVE mg/dL
KETONES UR: NEGATIVE mg/dL
Leukocytes, UA: NEGATIVE
NITRITE: NEGATIVE
SPECIFIC GRAVITY, URINE: 1.02 (ref 1.005–1.030)
pH: 5 (ref 5.0–8.0)

## 2015-05-26 LAB — BRAIN NATRIURETIC PEPTIDE: B NATRIURETIC PEPTIDE 5: 317 pg/mL — AB (ref 0.0–100.0)

## 2015-05-26 LAB — LACTIC ACID, PLASMA
LACTIC ACID, VENOUS: 1.1 mmol/L (ref 0.5–2.0)
LACTIC ACID, VENOUS: 1.2 mmol/L (ref 0.5–2.0)

## 2015-05-26 LAB — TROPONIN I
TROPONIN I: 0.03 ng/mL (ref <0.031)
Troponin I: <0.03 ng/mL (ref <0.031)

## 2015-05-26 LAB — MRSA PCR SCREENING: MRSA by PCR: NEGATIVE

## 2015-05-26 LAB — URINE MICROSCOPIC-ADD ON
Bacteria, UA: NONE SEEN
SQUAMOUS EPITHELIAL / LPF: NONE SEEN
WBC UA: NONE SEEN WBC/hpf (ref 0–5)

## 2015-05-26 LAB — BASIC METABOLIC PANEL
Anion gap: 12 (ref 5–15)
BUN: 29 mg/dL — ABNORMAL HIGH (ref 6–20)
CALCIUM: 7.9 mg/dL — AB (ref 8.9–10.3)
CHLORIDE: 97 mmol/L — AB (ref 101–111)
CO2: 32 mmol/L (ref 22–32)
CREATININE: 1.15 mg/dL (ref 0.61–1.24)
GFR calc non Af Amer: >60 mL/min (ref >60)
GLUCOSE: 175 mg/dL — AB (ref 65–99)
Potassium: 4.6 mmol/L (ref 3.5–5.1)
Sodium: 141 mmol/L (ref 135–145)

## 2015-05-26 LAB — TRIGLYCERIDES: Triglycerides: 134 mg/dL (ref <150)

## 2015-05-26 LAB — APTT: APTT: 42 s — AB (ref 24–37)

## 2015-05-26 LAB — HEPARIN LEVEL (UNFRACTIONATED)

## 2015-05-26 LAB — PROCALCITONIN: Procalcitonin: 3.02 ng/mL

## 2015-05-26 MED ORDER — LEVOFLOXACIN IN D5W 500 MG/100ML IV SOLN
INTRAVENOUS | Status: AC
  Filled 2015-05-26: qty 100

## 2015-05-26 MED ORDER — MIDAZOLAM HCL 5 MG/5ML IJ SOLN
INTRAMUSCULAR | Status: AC
  Filled 2015-05-26: qty 5

## 2015-05-26 MED ORDER — NITROGLYCERIN IN D5W 200-5 MCG/ML-% IV SOLN
10.0000 ug/min | INTRAVENOUS | Status: DC
  Administered 2015-05-26: 20 ug/min via INTRAVENOUS
  Filled 2015-05-26: qty 250

## 2015-05-26 MED ORDER — FENTANYL CITRATE (PF) 100 MCG/2ML IJ SOLN
100.0000 ug | INTRAMUSCULAR | Status: DC | PRN
  Administered 2015-05-28 (×2): 100 ug via INTRAVENOUS
  Filled 2015-05-26 (×2): qty 2

## 2015-05-26 MED ORDER — PROPOFOL 1000 MG/100ML IV EMUL
INTRAVENOUS | Status: AC
  Administered 2015-05-26: 5 mg via INTRAVENOUS
  Administered 2015-05-26: 520 ug/min via INTRAVENOUS
  Filled 2015-05-26: qty 100

## 2015-05-26 MED ORDER — PROPOFOL 1000 MG/100ML IV EMUL
5.0000 ug/kg/min | Freq: Once | INTRAVENOUS | Status: AC
Start: 2015-05-26 — End: 2015-05-28
  Administered 2015-05-26: 20 ug/kg/min via INTRAVENOUS

## 2015-05-26 MED ORDER — MIDAZOLAM HCL 5 MG/5ML IJ SOLN
4.0000 mg | Freq: Once | INTRAMUSCULAR | Status: AC
  Administered 2015-05-26: 4 mg via INTRAVENOUS

## 2015-05-26 MED ORDER — ATORVASTATIN CALCIUM 40 MG PO TABS
40.0000 mg | ORAL_TABLET | Freq: Every day | ORAL | Status: DC
  Administered 2015-05-27 – 2015-06-02 (×6): 40 mg
  Filled 2015-05-26 (×5): qty 1

## 2015-05-26 MED ORDER — DEXTROSE 5 % IV SOLN
500.0000 mg | INTRAVENOUS | Status: DC
  Administered 2015-05-26: 500 mg via INTRAVENOUS
  Filled 2015-05-26: qty 500

## 2015-05-26 MED ORDER — METHYLPREDNISOLONE SODIUM SUCC 40 MG IJ SOLR
40.0000 mg | Freq: Three times a day (TID) | INTRAMUSCULAR | Status: DC
  Administered 2015-05-26 – 2015-06-02 (×21): 40 mg via INTRAVENOUS
  Filled 2015-05-26 (×22): qty 1

## 2015-05-26 MED ORDER — PROPOFOL 1000 MG/100ML IV EMUL
0.0000 ug/kg/min | INTRAVENOUS | Status: DC
  Administered 2015-05-26: 16 ug/kg/min via INTRAVENOUS
  Administered 2015-05-27: 10 ug/kg/min via INTRAVENOUS
  Administered 2015-05-27: 20 ug/kg/min via INTRAVENOUS
  Administered 2015-05-27: 25 ug/kg/min via INTRAVENOUS
  Administered 2015-05-28 (×2): 50 ug/kg/min via INTRAVENOUS
  Administered 2015-05-28: 40 ug/kg/min via INTRAVENOUS
  Administered 2015-05-28: 50 ug/kg/min via INTRAVENOUS
  Administered 2015-05-28 (×2): 40 ug/kg/min via INTRAVENOUS
  Administered 2015-05-29 (×2): 35 ug/kg/min via INTRAVENOUS
  Administered 2015-05-29: 45 ug/kg/min via INTRAVENOUS
  Administered 2015-05-29 (×2): 40 ug/kg/min via INTRAVENOUS
  Administered 2015-05-29 (×2): 45 ug/kg/min via INTRAVENOUS
  Administered 2015-05-30: 20 ug/kg/min via INTRAVENOUS
  Administered 2015-05-30: 40 ug/kg/min via INTRAVENOUS
  Filled 2015-05-26 (×4): qty 100
  Filled 2015-05-26 (×2): qty 200
  Filled 2015-05-26 (×10): qty 100

## 2015-05-26 MED ORDER — CLOPIDOGREL BISULFATE 75 MG PO TABS
75.0000 mg | ORAL_TABLET | Freq: Every day | ORAL | Status: DC
  Administered 2015-05-26 – 2015-05-31 (×6): 75 mg
  Filled 2015-05-26 (×6): qty 1

## 2015-05-26 MED ORDER — FUROSEMIDE 10 MG/ML IJ SOLN
40.0000 mg | Freq: Two times a day (BID) | INTRAMUSCULAR | Status: DC
  Administered 2015-05-26: 40 mg via INTRAVENOUS
  Filled 2015-05-26: qty 4

## 2015-05-26 MED ORDER — SODIUM CHLORIDE 0.9 % IV BOLUS (SEPSIS)
1000.0000 mL | Freq: Once | INTRAVENOUS | Status: AC
  Administered 2015-05-26: 1000 mL via INTRAVENOUS

## 2015-05-26 MED ORDER — IPRATROPIUM-ALBUTEROL 0.5-2.5 (3) MG/3ML IN SOLN
3.0000 mL | Freq: Four times a day (QID) | RESPIRATORY_TRACT | Status: DC
  Administered 2015-05-26: 3 mL via RESPIRATORY_TRACT
  Filled 2015-05-26: qty 3

## 2015-05-26 MED ORDER — VANCOMYCIN HCL 10 G IV SOLR
1500.0000 mg | Freq: Once | INTRAVENOUS | Status: AC
  Administered 2015-05-26: 1500 mg via INTRAVENOUS
  Filled 2015-05-26: qty 1500

## 2015-05-26 MED ORDER — HEPARIN SODIUM (PORCINE) 5000 UNIT/ML IJ SOLN: 5000.0000 [IU] | Freq: Three times a day (TID) | INTRAMUSCULAR | Status: DC

## 2015-05-26 MED ORDER — HEPARIN (PORCINE) IN NACL 100-0.45 UNIT/ML-% IJ SOLN
1300.0000 [IU]/h | INTRAMUSCULAR | Status: DC
  Administered 2015-05-26: 1400 [IU]/h via INTRAVENOUS
  Administered 2015-05-27: 1700 [IU]/h via INTRAVENOUS
  Administered 2015-05-28: 1900 [IU]/h via INTRAVENOUS
  Administered 2015-05-28: 2300 [IU]/h via INTRAVENOUS
  Administered 2015-05-29 – 2015-05-30 (×3): 2100 [IU]/h via INTRAVENOUS
  Administered 2015-05-30: 1900 [IU]/h via INTRAVENOUS
  Administered 2015-05-31: 1600 [IU]/h via INTRAVENOUS
  Administered 2015-06-01: 1300 [IU]/h via INTRAVENOUS
  Filled 2015-05-26 (×11): qty 250

## 2015-05-26 MED ORDER — FUROSEMIDE 10 MG/ML IJ SOLN
INTRAMUSCULAR | Status: AC
  Filled 2015-05-26: qty 8

## 2015-05-26 MED ORDER — CHLORHEXIDINE GLUCONATE 0.12% ORAL RINSE (MEDLINE KIT)
15.0000 mL | Freq: Two times a day (BID) | OROMUCOSAL | Status: DC
  Administered 2015-05-26 – 2015-06-03 (×13): 15 mL via OROMUCOSAL

## 2015-05-26 MED ORDER — IPRATROPIUM-ALBUTEROL 0.5-2.5 (3) MG/3ML IN SOLN: 3.0000 mL | Freq: Once | RESPIRATORY_TRACT | Status: DC

## 2015-05-26 MED ORDER — VANCOMYCIN HCL IN DEXTROSE 750-5 MG/150ML-% IV SOLN
750.0000 mg | Freq: Two times a day (BID) | INTRAVENOUS | Status: DC
  Administered 2015-05-27 – 2015-05-28 (×3): 750 mg via INTRAVENOUS
  Filled 2015-05-26 (×5): qty 150

## 2015-05-26 MED ORDER — IPRATROPIUM-ALBUTEROL 0.5-2.5 (3) MG/3ML IN SOLN
RESPIRATORY_TRACT | Status: AC
  Administered 2015-05-26: 3 mL
  Filled 2015-05-26: qty 3

## 2015-05-26 MED ORDER — BUDESONIDE 0.5 MG/2ML IN SUSP
0.5000 mg | Freq: Two times a day (BID) | RESPIRATORY_TRACT | Status: DC
  Administered 2015-05-26 – 2015-06-06 (×22): 0.5 mg via RESPIRATORY_TRACT
  Filled 2015-05-26 (×22): qty 2

## 2015-05-26 MED ORDER — ANTISEPTIC ORAL RINSE SOLUTION (CORINZ)
7.0000 mL | OROMUCOSAL | Status: DC
  Administered 2015-05-26 – 2015-05-31 (×50): 7 mL via OROMUCOSAL

## 2015-05-26 MED ORDER — PANTOPRAZOLE SODIUM 40 MG IV SOLR
40.0000 mg | Freq: Every day | INTRAVENOUS | Status: DC
  Administered 2015-05-26 – 2015-05-31 (×6): 40 mg via INTRAVENOUS
  Filled 2015-05-26 (×6): qty 40

## 2015-05-26 MED ORDER — IPRATROPIUM-ALBUTEROL 0.5-2.5 (3) MG/3ML IN SOLN
3.0000 mL | RESPIRATORY_TRACT | Status: DC
  Administered 2015-05-26 – 2015-06-02 (×38): 3 mL via RESPIRATORY_TRACT
  Filled 2015-05-26 (×38): qty 3

## 2015-05-26 MED ORDER — ETOMIDATE 2 MG/ML IV SOLN
20.0000 mg/kg | Freq: Once | INTRAVENOUS | Status: AC
  Administered 2015-05-26: 15:00:00 via INTRAVENOUS

## 2015-05-26 MED ORDER — INSULIN ASPART 100 UNIT/ML ~~LOC~~ SOLN
0.0000 [IU] | SUBCUTANEOUS | Status: DC
Start: 2015-05-26 — End: 2015-06-02
  Administered 2015-05-27 (×4): 3 [IU] via SUBCUTANEOUS
  Administered 2015-05-27: 2 [IU] via SUBCUTANEOUS
  Administered 2015-05-27: 3 [IU] via SUBCUTANEOUS
  Administered 2015-05-28: 5 [IU] via SUBCUTANEOUS
  Administered 2015-05-28 (×2): 3 [IU] via SUBCUTANEOUS
  Administered 2015-05-28: 5 [IU] via SUBCUTANEOUS
  Administered 2015-05-28: 3 [IU] via SUBCUTANEOUS
  Administered 2015-05-28: 5 [IU] via SUBCUTANEOUS
  Administered 2015-05-29 (×2): 3 [IU] via SUBCUTANEOUS
  Administered 2015-05-29: 8 [IU] via SUBCUTANEOUS
  Administered 2015-05-29 (×4): 5 [IU] via SUBCUTANEOUS
  Administered 2015-05-30 (×3): 3 [IU] via SUBCUTANEOUS
  Administered 2015-05-30: 5 [IU] via SUBCUTANEOUS
  Administered 2015-05-30 (×2): 3 [IU] via SUBCUTANEOUS
  Administered 2015-05-31: 2 [IU] via SUBCUTANEOUS
  Administered 2015-05-31: 5 [IU] via SUBCUTANEOUS
  Administered 2015-05-31 (×2): 2 [IU] via SUBCUTANEOUS
  Administered 2015-05-31: 3 [IU] via SUBCUTANEOUS
  Administered 2015-06-01 – 2015-06-02 (×4): 2 [IU] via SUBCUTANEOUS

## 2015-05-26 MED ORDER — FUROSEMIDE 10 MG/ML IJ SOLN
80.0000 mg | Freq: Once | INTRAMUSCULAR | Status: AC
  Administered 2015-05-26: 80 mg via INTRAVENOUS
  Filled 2015-05-26: qty 8

## 2015-05-26 MED ORDER — FENTANYL CITRATE (PF) 100 MCG/2ML IJ SOLN
100.0000 ug | INTRAMUSCULAR | Status: DC | PRN
  Administered 2015-05-27 – 2015-05-28 (×6): 100 ug via INTRAVENOUS
  Filled 2015-05-26 (×6): qty 2

## 2015-05-26 MED ORDER — SUCCINYLCHOLINE CHLORIDE 20 MG/ML IJ SOLN
100.0000 mg | Freq: Once | INTRAMUSCULAR | Status: AC
  Administered 2015-05-26: 100 mg via INTRAVENOUS

## 2015-05-26 MED ORDER — ALBUTEROL SULFATE (2.5 MG/3ML) 0.083% IN NEBU
2.5000 mg | INHALATION_SOLUTION | RESPIRATORY_TRACT | Status: DC | PRN
  Administered 2015-05-31: 2.5 mg via RESPIRATORY_TRACT
  Filled 2015-05-26: qty 3

## 2015-05-26 MED ORDER — SODIUM CHLORIDE 0.9 % IV SOLN: 250.0000 mL | INTRAVENOUS | Status: DC | PRN

## 2015-05-26 MED ORDER — DEXTROSE 5 % IV SOLN
2.0000 g | Freq: Three times a day (TID) | INTRAVENOUS | Status: DC
  Administered 2015-05-26 – 2015-06-01 (×17): 2 g via INTRAVENOUS
  Filled 2015-05-26 (×19): qty 2

## 2015-05-26 MED ORDER — PROPOFOL 1000 MG/100ML IV EMUL
5.0000 ug/kg/min | Freq: Once | INTRAVENOUS | Status: DC
  Administered 2015-05-26: 15 ug/kg/min via INTRAVENOUS
  Administered 2015-05-26: 10 ug/kg/min via INTRAVENOUS
  Administered 2015-05-26: 520 ug/min via INTRAVENOUS
  Administered 2015-05-26: 5 mg via INTRAVENOUS

## 2015-05-26 MED ORDER — SODIUM CHLORIDE 0.9 % IV SOLN
INTRAVENOUS | Status: DC
  Administered 2015-05-26: 20:00:00 via INTRAVENOUS

## 2015-05-26 NOTE — Progress Notes (Signed)
ANTIBIOTIC CONSULT NOTE - INITIAL  Pharmacy Consult for Vancomycin Indication:  Sepsis  Allergies  Allergen Reactions  . Codeine     Red blotches  . Penicillins     Childhood reaction    Patient Measurements:   Adjusted Body Weight:   Vital Signs: BP: 70/54 mmHg (04/27 1545) Pulse Rate: 78 (04/27 1545) Intake/Output from previous day:   Intake/Output from this shift: Total I/O In: -  Out: 525 [Urine:525]  Labs:  Recent Labs  05/26/15 1410  WBC 18.8*  HGB 11.1*  PLT 373  CREATININE 1.15   CrCl cannot be calculated (Unknown ideal weight.). No results for input(s): VANCOTROUGH, VANCOPEAK, VANCORANDOM, GENTTROUGH, GENTPEAK, GENTRANDOM, TOBRATROUGH, TOBRAPEAK, TOBRARND, AMIKACINPEAK, AMIKACINTROU, AMIKACIN in the last 72 hours.   Microbiology: No results found for this or any previous visit (from the past 720 hour(s)).  Medical History: Past Medical History  Diagnosis Date  . Tobacco abuse     40 Pk-yr (1- 1 1/2 PPD) --> Quit 11/18/2012 when presented with STEMI  . COPD (chronic obstructive pulmonary disease) ([place])   . STEMI of inferior wall 11/18/12- RCA BMS 11/17/2012    Cardiogenic shock - post MI with RV infarction [785.51]  . CAD S/P percutaneous coronary angioplasty  11/18/2012    - PCI mid RCA - 5.0 mm x 24 mm VeriFlex BMS (5.6 mm)  . Atrial fibrillation with RVR - peri-MI; recurrent after DCCV 11/19/2012  . Ischemic cardiomyopathy 11/19/2012    EF 40-45% with inferoseptal HK, Gr 2DD - high LVEDP/LAP. --> confirmed by f/u Echo 03/2013.  [name] Dyslipidemia- low HDL 11/19/2012  . Chronic combined systolic and diastolic heart failure, NYHA class 2 ([place]) 11/17/2012  . Colon polyp     Status post polypectomy x3.    Medications:  Scheduled:  . ipratropium-albuterol  3 mL Nebulization Once  . [START ON 05/27/2015] vancomycin  750 mg Intravenous Q12H   Assessment: 60 yo male ED patient, SOB for 2 days. History of COPD, cardiac disease. Chest x-ray indicated  probable pneumonia. Calculated corrected CrCl 70.1 ml/min Last available patient weight 104.9 kg   Goal of Therapy:  Vancomycin trough level 15-20 mcg/ml  Plan:  Vancomycin 1500 mg IV loading dose, then 750 mg IV every 12 hours Vancomycin trough at steady state Monitor renal function Labs per protocol F/U updated patient weight  Chriss Czar 05/26/2015,4:09 PM

## 2015-05-26 NOTE — ED Notes (Signed)
Respiratory called

## 2015-05-26 NOTE — ED Notes (Addendum)
EDP at bedside preparing to intubate. Family notified.

## 2015-05-26 NOTE — ED Notes (Signed)
Called to give report, no answer.

## 2015-05-26 NOTE — ED Notes (Signed)
Called to give report, nurse to call back. 

## 2015-05-26 NOTE — Progress Notes (Signed)
ANTIBIOTIC CONSULT NOTE - INITIAL  Pharmacy Consult for aztreonam/vancomycin/heparin Indication:  HCAP/afib  Allergies  Allergen Reactions  . Codeine     Red blotches  . Penicillins     Childhood reaction    Patient Measurements: Height: 5' 11"  (180.3 cm) Weight: 228 lb 13.4 oz (103.8 kg) IBW/kg (Calculated) : 75.3 Heparin dosing weight = 97 kg  Vital Signs: Temp: 98.4 F (36.9 C) (04/27 1712) BP: 105/66 mmHg (04/27 1930) Pulse Rate: 84 (04/27 1930) Intake/Output from previous day:   Intake/Output from this shift:    Labs:  Recent Labs  05/26/15 1410  WBC 18.8*  HGB 11.1*  PLT 373  CREATININE 1.15   Estimated Creatinine Clearance: 84.8 mL/min (by C-G formula based on Cr of 1.15). No results for input(s): VANCOTROUGH, VANCOPEAK, VANCORANDOM, GENTTROUGH, GENTPEAK, GENTRANDOM, TOBRATROUGH, TOBRAPEAK, TOBRARND, AMIKACINPEAK, AMIKACINTROU, AMIKACIN in the last 72 hours.   Microbiology: No results found for this or any previous visit (from the past 720 hour(s)).  Medical History: Past Medical History  Diagnosis Date  . Tobacco abuse     40 Pk-yr (1- 1 1/2 PPD) --> Quit 11/18/2012 when presented with STEMI  . COPD (chronic obstructive pulmonary disease) ([place])   . STEMI of inferior wall 11/18/12- RCA BMS 11/17/2012    Cardiogenic shock - post MI with RV infarction [785.51]  . CAD S/P percutaneous coronary angioplasty  11/18/2012    - PCI mid RCA - 5.0 mm x 24 mm VeriFlex BMS (5.6 mm)  . Atrial fibrillation with RVR - peri-MI; recurrent after DCCV 11/19/2012  . Ischemic cardiomyopathy 11/19/2012    EF 40-45% with inferoseptal HK, Gr 2DD - high LVEDP/LAP. --> confirmed by f/u Echo 03/2013.  [name] Dyslipidemia- low HDL 11/19/2012  . Chronic combined systolic and diastolic heart failure, NYHA class 2 ([place]) 11/17/2012  . Colon polyp     Status post polypectomy x3.    Medications:  Scheduled:  . antiseptic oral rinse  7 mL Mouth Rinse 10 times per day  . azithromycin   500 mg Intravenous Q24H  . aztreonam  2 g Intravenous Q8H  . chlorhexidine gluconate (SAGE KIT)  15 mL Mouth Rinse BID  . heparin  5,000 Units Subcutaneous Q8H  . ipratropium-albuterol  3 mL Nebulization Once  . methylPREDNISolone (SOLU-MEDROL) injection  40 mg Intravenous Q8H  . pantoprazole (PROTONIX) IV  40 mg Intravenous QHS  . [START ON 05/27/2015] vancomycin  750 mg Intravenous Q12H   Assessment: ID: 60 yo male with History of COPD, cardiac disease transferred from [facility] with SOB for 2 days. Chest x-ray indicated probable pneumonia, he was started on vancomycin earlier today. Now to add aztreonam. crcl ~ 85 ml/min. He received 1.5 g vancomycin at 1530.  Vancomycin 4/27 >> Azithromycin 4/27 x1 Aztreonam 4/27 >>  4/27 Urine - 4/27 blood x 2 - 4/27 respiratory -   Anticoagulation: Pt is on Eliquis PTA for afib, now on hold, will start IV heparin, Hgb 11.1, pltc 373K. Pt is currently intubated, per family, pt took AM meds, so likely had a dose of eliquis this AM  Goal of Therapy:  Vancomycin trough level 15-20 mcg/ml  Plan:  Aztreonam 2g IV Q 8 hrs Vancomycin 750 mg IV every 12 hours, next dose 0400 as ordered. Vancomycin trough at steady state Monitor renal function, f/u cultures Baseline aPTT, Heparin level Heparin infusion 1400 units/hr without bolus to start after baseline labs are drawn 6 hr heparin level with AM labs.   Maryanna Shape, PharmD, BCPS  Clinical Pharmacist  Pager: [phone]   05/26/2015,7:56 PM

## 2015-05-26 NOTE — ED Notes (Signed)
Nurse from [facility] to call back for report.

## 2015-05-26 NOTE — ED Notes (Signed)
Nitro stopped per Dr. Lacinda Axon.

## 2015-05-26 NOTE — H&P (Signed)
PULMONARY / CRITICAL CARE MEDICINE   Name: Benjamin [name] MRN: [id number] DOB: 20-Jun-1955    ADMISSION DATE:  05/26/2015  REFERRING MD:  Forestine Na   CHIEF COMPLAINT:  Respiratory failure   HISTORY OF PRESENT ILLNESS:   60yo male with hx COPD, CAD, AFib, combined CHF presented to [facility] ER 4/27 with 2 day hx SOB.  He initially sought care at his PCP's office who sent him urgently to ER with respiratory distress.  Pt with worsening respiratory acidosis and lethargy despite bipap attempts in ER and was intubated and tx to [facility] with AECOPD and probable HCAP.  Had recent admission 01/31/15 through 02/02/15 for AECOPD, discharged on azithro and prednisone taper.  PAST MEDICAL HISTORY :  He  has a past medical history of Tobacco abuse; COPD (chronic obstructive pulmonary disease) ([place]); STEMI of inferior wall 11/18/12- RCA BMS (11/17/2012); CAD S/P percutaneous coronary angioplasty  (11/18/2012); Atrial fibrillation with RVR - peri-MI; recurrent after DCCV (11/19/2012); Ischemic cardiomyopathy (11/19/2012); Dyslipidemia- low HDL (11/19/2012); Chronic combined systolic and diastolic heart failure, NYHA class 2 ([place]) (11/17/2012); and Colon polyp.  PAST SURGICAL HISTORY: He  has past surgical history that includes Dental extractions; Coronary angioplasty with stent (Right, 11/17/12); transthoracic echocardiogram (11/18/2012); transthoracic echocardiogram (March 15); Colonoscopy (N/A, 11/20/2013); and left heart catheterization with coronary angiogram (N/A, 11/17/2012).  Allergies  Allergen Reactions  . Codeine     Red blotches  . Penicillins     Childhood reaction    No current facility-administered medications on file prior to encounter.   Current Outpatient Prescriptions on File Prior to Encounter  Medication Sig  . acetaminophen (TYLENOL) 500 MG tablet Take by mouth every 6 (six) hours as needed for pain.  [name] albuterol (PROVENTIL HFA;VENTOLIN HFA) 108 (90 BASE) MCG/ACT inhaler  Inhale 2 puffs into the lungs every 6 (six) hours as needed for wheezing.  [name] albuterol (PROVENTIL) (2.5 MG/3ML) 0.083% nebulizer solution Inhale 0.083 mLs into the lungs 4 (four) times daily.  [name] apixaban (ELIQUIS) 5 MG TABS tablet Take 1 tablet (5 mg total) by mouth 2 (two) times daily.  [name] atorvastatin (LIPITOR) 40 MG tablet Take 1 tablet (40 mg total) by mouth daily.  [name] azithromycin (ZITHROMAX) 250 MG tablet Take 1 tablet daily until 02/04/15 then stop  . bisoprolol (ZEBETA) 5 MG tablet TAKE 1/2 TABLET BY MOUTH DAILY  . clopidogrel (PLAVIX) 75 MG tablet TAKE 1 TABLET BY MOUTH EVERY DAY  . Coenzyme Q10 (CO Q-10) 100 MG CAPS Take 300 mg by mouth daily.  [name] DALIRESP 500 MCG TABS tablet Take 500 mcg by mouth daily.   [name] Dextromethorphan-Guaifenesin (MUCINEX DM MAXIMUM STRENGTH PO) Take by mouth every 4 (four) hours as needed.  . furosemide (LASIX) 40 MG tablet TAKE 1 TABLET TO 2 TABLET AS NEEDED IN THE MORNING  AND 1 TABLET IN THE EVENING  . gabapentin (NEURONTIN) 300 MG capsule Take 600 mg by mouth 4 (four) times daily.  [name] ipratropium (ATROVENT) 0.06 % nasal spray 1 spray as needed.   [name] losartan (COZAAR) 50 MG tablet Take 1 tablet (50 mg total) by mouth daily.  . nitroGLYCERIN (NITROSTAT) 0.4 MG SL tablet Place 1 tablet (0.4 mg total) under the tongue every 5 (five) minutes x 3 doses as needed for chest pain.  Donell Sievert IN Inhale into the lungs. Using 4 liters nasal cannula ,continuous  . potassium chloride (K-DUR) 10 MEQ tablet Take 10 mEq by mouth daily.  . predniSONE (DELTASONE) 50 MG tablet Take 1  tablet of prednisone daily for the next 7 days and thens top  . SPIRIVA HANDIHALER 18 MCG inhalation capsule Place 18 mcg into inhaler and inhale daily.   . SYMBICORT 160-4.5 MCG/ACT inhaler Inhale 2 puffs into the lungs 2 (two) times daily.     FAMILY HISTORY:  His indicated that his mother is deceased. He indicated that his father is deceased. He indicated that his sister is deceased. He  indicated that his daughter is alive. He indicated that his son is alive.   SOCIAL HISTORY: He  reports that he has quit smoking. He has never used smokeless tobacco. He reports that he does not drink alcohol or use illicit drugs.  REVIEW OF SYSTEMS:   Unable, sedated on vent.  HPI as above obtained from records.   SUBJECTIVE:  Appears comfortable.   VITAL SIGNS: BP 85/61 mmHg  Pulse 76  Temp(Src) 98.2 F (36.8 C)  Resp 19  SpO2 96%  HEMODYNAMICS:    VENTILATOR SETTINGS: Vent Mode:  [-] PRVC FiO2 (%):  [50 %-100 %] 50 % Set Rate:  [14 bmp] 14 bmp Vt Set:  [500 mL] 500 mL PEEP:  [5 cmH20] 5 cmH20 Plateau Pressure:  [22 cmH20] 22 cmH20  INTAKE / OUTPUT:    PHYSICAL EXAMINATION: General: Adult male, resting in bed, in NAD. Neuro: Sedated, comfortable, not following commands. HEENT: [place]/AT. PERRL, sclerae anicteric. Cardiovascular: RRR, no M/R/G.  Lungs: Respirations even and unlabored.  Expiratory wheezes bilaterally. Abdomen: BS x 4, soft, NT/ND.  Musculoskeletal: No gross deformities, no edema.  Skin: Intact, warm, no rashes.    LABS:  BMET  Recent Labs Lab 05/26/15 1410  NA 141  K 4.6  CL 97*  CO2 32  BUN 29*  CREATININE 1.15  GLUCOSE 175*    Electrolytes  Recent Labs Lab 05/26/15 1410  CALCIUM 7.9*    CBC  Recent Labs Lab 05/26/15 1410  WBC 18.8*  HGB 11.1*  HCT 36.4*  PLT 373    Coag's No results for input(s): APTT, INR in the last 168 hours.  Sepsis Markers  Recent Labs Lab 05/26/15 1430  LATICACIDVEN 1.1    ABG  Recent Labs Lab 05/26/15 1420  PHART 7.140*  PCO2ART CRITICAL RESULT CALLED TO, READ BACK BY AND VERIFIED WITH:  PO2ART 353*    Liver Enzymes No results for input(s): AST, ALT, ALKPHOS, BILITOT, ALBUMIN in the last 168 hours.  Cardiac Enzymes  Recent Labs Lab 05/26/15 1410  TROPONINI 0.03    Glucose No results for input(s): GLUCAP in the last 168 hours.  Imaging Dg Chest Portable 1  View  05/26/2015  CLINICAL DATA:  ET TUBE PLACEMENT, OG TUBE PLACEMENT EXAM: PORTABLE CHEST 1 VIEW COMPARISON:  05/26/2015 at 1349 hours FINDINGS: New endotracheal tube has its tip projecting 5.6 cm above the carina. Nasal/orogastric tube passes below the diaphragm into the stomach. New Changes of emphysema and lung base airspace interstitial opacities are without change from the earlier study. IMPRESSION: 1. Endotracheal tube tip projects 5.6 cm above the carina. 2. Nasal/orogastric tube is well positioned passing into the stomach. Electronically Signed   By: Lajean Manes M.D.   On: 05/26/2015 15:35   Dg Chest Portable 1 View  05/26/2015  CLINICAL DATA:  Shortness of breath for 2 days EXAM: PORTABLE CHEST 1 VIEW COMPARISON:  03/08/2015 FINDINGS: Cardiac shadow is stable. Diffuse emphysematous changes are seen. Increased density is noted in the bases bilaterally consistent with some bibasilar atelectasis/infiltrate. No definitive effusion or pneumothorax is  noted. IMPRESSION: Emphysematous changes with bibasilar opacities Electronically Signed   By: Inez Catalina M.D.   On: 05/26/2015 13:57     STUDIES:  CXR 04/27 > emphysematous changes.  CULTURES: Sputum 4/27>>> BC x 2 4/27>>> Urine 4/27>>>  ANTIBIOTICS: Vanc 4/27>>> Aztreonam 4/27>>>  SIGNIFICANT EVENTS:   LINES/TUBES: ETT 4/27>>>  DISCUSSION: 60 y.o. M admitted with AECOPD and possible HCAP, failed BiPAP and required intubation.  ASSESSMENT / PLAN:  PULMONARY A: Acute on chronic respiratory failure. AECOPD. HCAP. P:   Full vent support. F/u ABG. Wean as able. Abx per ID section. Solumedrol. DuoNebs / Budesonide / Albuterol. CXR in AM.  CARDIOVASCULAR A: Combined CHF - last echo from 04/13/13 with EF 40-45%, grade 1DD. Hx CAD, AFib. P:  Repeat troponin. Consider repeat echo. Heparin gtt in lieu of outpatient eliquis. Continue outpatient plavix, atorvastatin. Hold outpatient eliquis, bisoprolol, lasix,  losartan.  RENAL A: AKI - mild (aseline Scr ~0.8). ? Hypocalcemia - no albumin to correct for. P:   NS @ 100. Assess ionized calcium. BMP in AM.  GASTROINTESTINAL A: GI prophylaxis. Nutrition. P:   SUP: pantoprazole. NPO for now.  HEMATOLOGIC A: Anemia - mild. VTE prophylaxis. P:  Transfuse for hgb < 7. SCD's / heparin gtt. CBC in AM.  INFECTIOUS A: HCAP - PCN allergy. P:   Abx as above (vanc / aztreonam). Follow cultures. PCT algorithm to limit abx exposure.  ENDOCRINE A: Hyperglycemia - no hx DM. P:   SSI. Assess Hgb A1c.  NEUROLOGIC A: AMS r/t hypercarbia  P:   Sedation: Propofol gtt / fentanyl PRN. RASS goal: 0 to -1. Daily WUA. Hold outpatient gabapentin.   FAMILY  Updates:  None.  Inter-disciplinary family meet or Palliative Care meeting due by:  06/01/15.   CC time: 40 minutes.   Montey Hora, [facility] Pager: [phone]  or [phone] 05/26/2015, 7:35 PM

## 2015-05-26 NOTE — ED Provider Notes (Signed)
CSN: [id number]     Arrival date & time 05/26/15  1330 History   First MD Initiated Contact with Patient 05/26/15 1340     Chief Complaint  Patient presents with  . Shortness of Breath     (Consider location/radiation/quality/duration/timing/severity/associated sxs/prior Treatment) HPI..... Level V caveat for urgent need for intervention. Patient has been short of breath for 2 days. He was seen by his primary care doctor today and sent urgently to the emergency department. He was given IV steroids and nebulizer treatments via EMS. On presentation here, patient was obtunded with little air movement. He has extensive history of COPD and cardiac disease.  Past Medical History  Diagnosis Date  . Tobacco abuse     40 Pk-yr (1- 1 1/2 PPD) --> Quit 11/18/2012 when presented with STEMI  . COPD (chronic obstructive pulmonary disease) ([place])   . STEMI of inferior wall 11/18/12- RCA BMS 11/17/2012    Cardiogenic shock - post MI with RV infarction [785.51]  . CAD S/P percutaneous coronary angioplasty  11/18/2012    - PCI mid RCA - 5.0 mm x 24 mm VeriFlex BMS (5.6 mm)  . Atrial fibrillation with RVR - peri-MI; recurrent after DCCV 11/19/2012  . Ischemic cardiomyopathy 11/19/2012    EF 40-45% with inferoseptal HK, Gr 2DD - high LVEDP/LAP. --> confirmed by f/u Echo 03/2013.  [name] Dyslipidemia- low HDL 11/19/2012  . Chronic combined systolic and diastolic heart failure, NYHA class 2 ([place]) 11/17/2012  . Colon polyp     Status post polypectomy x3.   Past Surgical History  Procedure Laterality Date  . Dental extractions    . Coronary angioplasty with stent placement Right 11/17/12    Mid RCA aspiration thrombectomy and PCI with VeriFlex BMS 5.0 mm x 24 mm (5.6 mm; also PTCA of distal RPL 4 distal embolization.  . Transthoracic echocardiogram  11/18/2012    Normal LV size, moderate reduced function 40-45% with severe HK-AK of the inferoseptal wall with incoordinate septal motion.  . Transthoracic  echocardiogram  March 15    Moderately reduced LVEF of 40 at 45% with inferoseptal hypokinesis and high filling pressures.  . Colonoscopy N/A 11/20/2013    Procedure: COLONOSCOPY;  Surgeon: Jamesetta So, MD;  Location: [facility];  Service: Gastroenterology;  Laterality: N/A;  . Left heart catheterization with coronary angiogram N/A 11/17/2012    Procedure: LEFT HEART CATHETERIZATION WITH CORONARY ANGIOGRAM;  Surgeon: Leonie Man, MD;  Location: [facility] CATH LAB;  Service: Cardiovascular;  Laterality: N/A;   Family History  Problem Relation Age of Onset  . Heart attack Mother   . Heart attack Father   . Cancer Sister    Social History  Substance Use Topics  . Smoking status: Former Smoker -- 1.00 packs/day for 40 years  . Smokeless tobacco: Never Used     Comment: [place] 10/2102  . Alcohol Use: No    Review of Systems    Allergies  Codeine and Penicillins  Home Medications   Prior to Admission medications   Medication Sig Start Date End Date Taking? Authorizing Provider  acetaminophen (TYLENOL) 500 MG tablet Take by mouth every 6 (six) hours as needed for pain.    Historical Provider, MD  albuterol (PROVENTIL HFA;VENTOLIN HFA) 108 (90 BASE) MCG/ACT inhaler Inhale 2 puffs into the lungs every 6 (six) hours as needed for wheezing.    Historical Provider, MD  albuterol (PROVENTIL) (2.5 MG/3ML) 0.083% nebulizer solution Inhale 0.083 mLs into the lungs  4 (four) times daily. 12/11/14   Historical Provider, MD  apixaban (ELIQUIS) 5 MG TABS tablet Take 1 tablet (5 mg total) by mouth 2 (two) times daily. 03/05/14   Leonie Man, MD  atorvastatin (LIPITOR) 40 MG tablet Take 1 tablet (40 mg total) by mouth daily. 06/10/14   Leonie Man, MD  azithromycin [facility]) 250 MG tablet Take 1 tablet daily until 02/04/15 then stop 02/02/15   Nita Sells, MD  bisoprolol (ZEBETA) 5 MG tablet TAKE 1/2 TABLET BY MOUTH DAILY 05/06/15   Leonie Man, MD  clopidogrel  (PLAVIX) 75 MG tablet TAKE 1 TABLET BY MOUTH EVERY DAY 03/07/15   Troy Sine, MD  Coenzyme Q10 (CO Q-10) 100 MG CAPS Take 300 mg by mouth daily.    Historical Provider, MD  DALIRESP 500 MCG TABS tablet Take 500 mcg by mouth daily.  08/04/13   Historical Provider, MD  Dextromethorphan-Guaifenesin ([place] DM MAXIMUM STRENGTH PO) Take by mouth every 4 (four) hours as needed.    Historical Provider, MD  furosemide (LASIX) 40 MG tablet TAKE 1 TABLET TO 2 TABLET AS NEEDED IN THE MORNING  AND 1 TABLET IN THE EVENING 01/04/15   Leonie Man, MD  gabapentin (NEURONTIN) 300 MG capsule Take 600 mg by mouth 4 (four) times daily.    Historical Provider, MD  ipratropium (ATROVENT) 0.06 % nasal spray 1 spray as needed.  11/17/13   Historical Provider, MD  losartan (COZAAR) 50 MG tablet Take 1 tablet (50 mg total) by mouth daily. 06/10/14   Leonie Man, MD  nitroGLYCERIN (NITROSTAT) 0.4 MG SL tablet Place 1 tablet (0.4 mg total) under the tongue every 5 (five) minutes x 3 doses as needed for chest pain. 11/22/12   Brittainy Erie Noe, PA-C  OXYGEN-HELIUM IN Inhale into the lungs. Using 4 liters nasal cannula ,continuous    Historical Provider, MD  potassium chloride (K-DUR) 10 MEQ tablet Take 10 mEq by mouth daily.    Historical Provider, MD  predniSONE (DELTASONE) 50 MG tablet Take 1 tablet of prednisone daily for the next 7 days and thens top 02/02/15   Nita Sells, MD  SPIRIVA HANDIHALER 18 MCG inhalation capsule Place 18 mcg into inhaler and inhale daily.  06/20/13   Historical Provider, MD  SYMBICORT 160-4.5 MCG/ACT inhaler Inhale 2 puffs into the lungs 2 (two) times daily.  06/18/13   Historical Provider, MD   BP 126/86 mmHg  Pulse 96  Resp 39  SpO2 99% Physical Exam  Constitutional:  Not moving air well, will nod his head to questions  HENT:  Head: Normocephalic and atraumatic.  Eyes: Conjunctivae and EOM are normal. Pupils are equal, round, and reactive to light.  Neck: Normal range of  motion. Neck supple.  Cardiovascular: Normal rate and regular rhythm.   Pulmonary/Chest: Effort normal and breath sounds normal.  Abdominal: Soft. Bowel sounds are normal.  Musculoskeletal:  Unable  Neurological:  Unable  Skin: Skin is warm and dry.  Psychiatric:  Unable  Nursing note and vitals reviewed.   ED Course  .Intubation Date/Time: 05/26/2015 2:45 PM Performed by: Nat Christen Authorized by: Nat Christen Consent: The procedure was performed in an emergent situation. Risks and benefits: risks, benefits and alternatives were discussed Consent given by: spouse Patient understanding: patient states understanding of the procedure being performed Comments: Patient intubated with 7.5 endotracheal tube without complications after premedication with etomidate 20 mg and succinylcholine 100 mg. CO2 monitor showed appropriate color change. Chest x-ray pending.   (  including critical care time) Labs Review Labs Reviewed  CBC WITH DIFFERENTIAL/PLATELET - Abnormal; Notable for the following:    WBC 18.8 (*)    RBC 3.87 (*)    Hemoglobin 11.1 (*)    HCT 36.4 (*)    Neutro Abs 15.3 (*)    Monocytes Absolute 2.3 (*)    All other components within normal limits  BASIC METABOLIC PANEL - Abnormal; Notable for the following:    Chloride 97 (*)    Glucose, Bld 175 (*)    BUN 29 (*)    Calcium 7.9 (*)    All other components within normal limits  BRAIN NATRIURETIC PEPTIDE - Abnormal; Notable for the following:    B Natriuretic Peptide 317.0 (*)    All other components within normal limits  BLOOD GAS, ARTERIAL - Abnormal; Notable for the following:    pH, Arterial 7.140 (*)    pO2, Arterial 353 (*)    All other components within normal limits  TROPONIN I  LACTIC ACID, PLASMA  LACTIC ACID, PLASMA    Imaging Review Dg Chest Portable 1 View  05/26/2015  CLINICAL DATA:  Shortness of breath for 2 days EXAM: PORTABLE CHEST 1 VIEW COMPARISON:  03/08/2015 FINDINGS: Cardiac shadow is  stable. Diffuse emphysematous changes are seen. Increased density is noted in the bases bilaterally consistent with some bibasilar atelectasis/infiltrate. No definitive effusion or pneumothorax is noted. IMPRESSION: Emphysematous changes with bibasilar opacities Electronically Signed   By: Inez Catalina M.D.   On: 05/26/2015 13:57   I have personally reviewed and evaluated these images and lab results as part of my medical decision-making.   EKG Interpretation   Date/Time:  Thursday May 26 2015 13:36:23 EDT Ventricular Rate:  98 PR Interval:  178 QRS Duration: 112 QT Interval:  386 QTC Calculation: 493 R Axis:   -13 Text Interpretation:  Sinus rhythm Inferior infarct, old Lateral infarct,  acute (LAD) Baseline wander in lead(s) V1 Confirmed by Deavin Forst  MD, Elizbeth Posa  [phone]) on 05/26/2015 2:24:53 PM     CRITICAL CARE Performed by: Nat Christen Total critical care time: 50 minutes Critical care time was exclusive of separately billable procedures and treating other patients. Critical care was necessary to treat or prevent imminent or life-threatening deterioration. Critical care was time spent personally by me on the following activities: development of treatment plan with patient and/or surrogate as well as nursing, discussions with consultants, evaluation of patient's response to treatment, examination of patient, obtaining history from patient or surrogate, ordering and performing treatments and interventions, ordering and review of laboratory studies, ordering and review of radiographic studies, pulse oximetry and re-evaluation of patient's condition. MDM   Final diagnoses:  Sepsis, due to unspecified organism [facility])    I had very little information initially about patient's history. I thought he might be in severe heart failure and IV Lasix was ordered. Further  history was obtained from the family and chest x-ray showed probable pneumonia. BiPAP initiated immediately. Patient was later  intubated without complications after BiPAP failed. Vigorous IV hydration ordered along with IV vancomycin and Zosyn. Discussed with critical care physician. Patient will be transferred to [facility].    Nat Christen, MD 05/26/15 [phone]

## 2015-05-26 NOTE — ED Notes (Signed)
RT at bedside. ET advanced to 26 cm at lip.

## 2015-05-26 NOTE — ED Notes (Signed)
Pt here from [place]'s office for evaluation of being SOB since Tuesday. Pt was given one albuterol breathing treatment in the office and two by EMS. Pt was also given solumedrol 125 mg

## 2015-05-26 NOTE — ED Notes (Signed)
Attempted to call report again, put on hold. [facility] has full report.

## 2015-05-26 NOTE — ED Notes (Signed)
RSI kit at bedside.

## 2015-05-27 ENCOUNTER — Other Ambulatory Visit (HOSPITAL_COMMUNITY): Payer: Medicare Other

## 2015-05-27 ENCOUNTER — Inpatient Hospital Stay (HOSPITAL_COMMUNITY): Payer: Medicare Other

## 2015-05-27 DIAGNOSIS — G9340 Encephalopathy, unspecified: Secondary | ICD-10-CM

## 2015-05-27 DIAGNOSIS — Z978 Presence of other specified devices: Secondary | ICD-10-CM

## 2015-05-27 DIAGNOSIS — Z789 Other specified health status: Secondary | ICD-10-CM

## 2015-05-27 DIAGNOSIS — N179 Acute kidney failure, unspecified: Secondary | ICD-10-CM

## 2015-05-27 DIAGNOSIS — J189 Pneumonia, unspecified organism: Secondary | ICD-10-CM

## 2015-05-27 LAB — POCT I-STAT 3, ART BLOOD GAS (G3+)
Acid-Base Excess: 6 mmol/L — ABNORMAL HIGH (ref 0.0–2.0)
Bicarbonate: 34.2 meq/L — ABNORMAL HIGH (ref 20.0–24.0)
O2 Saturation: 99 %
Patient temperature: 98.6
TCO2: 36 mmol/L (ref 0–100)
pCO2 arterial: 68.6 mmHg (ref 35.0–45.0)
pH, Arterial: 7.306 — ABNORMAL LOW (ref 7.350–7.450)
pO2, Arterial: 180 mmHg — ABNORMAL HIGH (ref 80.0–100.0)

## 2015-05-27 LAB — APTT
APTT: 49 s — AB (ref 24–37)
aPTT: 47 s — ABNORMAL HIGH (ref 24–37)
aPTT: 76 seconds — ABNORMAL HIGH (ref 24–37)

## 2015-05-27 LAB — GLUCOSE, CAPILLARY
GLUCOSE-CAPILLARY: 153 mg/dL — AB (ref 65–99)
GLUCOSE-CAPILLARY: 153 mg/dL — AB (ref 65–99)
GLUCOSE-CAPILLARY: 137 mg/dL — AB (ref 65–99)
GLUCOSE-CAPILLARY: 154 mg/dL — AB (ref 65–99)
Glucose-Capillary: 162 mg/dL — ABNORMAL HIGH (ref 65–99)
Glucose-Capillary: 164 mg/dL — ABNORMAL HIGH (ref 65–99)

## 2015-05-27 LAB — CBC
HEMATOCRIT: 30.6 % — AB (ref 39.0–52.0)
HEMOGLOBIN: 9.1 g/dL — AB (ref 13.0–17.0)
MCH: 28 pg (ref 26.0–34.0)
MCHC: 29.7 g/dL — AB (ref 30.0–36.0)
MCV: 94.2 fL (ref 78.0–100.0)
Platelets: 296 10*3/uL (ref 150–400)
RBC: 3.25 MIL/uL — ABNORMAL LOW (ref 4.22–5.81)
RDW: 15 % (ref 11.5–15.5)
WBC: 11.8 10*3/uL — ABNORMAL HIGH (ref 4.0–10.5)

## 2015-05-27 LAB — BASIC METABOLIC PANEL
Anion gap: 13 (ref 5–15)
BUN: 27 mg/dL — AB (ref 6–20)
CALCIUM: 7.9 mg/dL — AB (ref 8.9–10.3)
CHLORIDE: 100 mmol/L — AB (ref 101–111)
CO2: 29 mmol/L (ref 22–32)
CREATININE: 1.31 mg/dL — AB (ref 0.61–1.24)
GFR calc Af Amer: >60 mL/min (ref >60)
GFR calc non Af Amer: 58 mL/min — ABNORMAL LOW (ref >60)
Glucose, Bld: 163 mg/dL — ABNORMAL HIGH (ref 65–99)
Potassium: 4.2 mmol/L (ref 3.5–5.1)
Sodium: 142 mmol/L (ref 135–145)

## 2015-05-27 LAB — MAGNESIUM: Magnesium: 2.2 mg/dL (ref 1.7–2.4)

## 2015-05-27 LAB — PHOSPHORUS: Phosphorus: 2.5 mg/dL (ref 2.5–4.6)

## 2015-05-27 LAB — TROPONIN I
Troponin I: <0.03 ng/mL
Troponin I: 0.06 ng/mL — ABNORMAL HIGH (ref <0.031)

## 2015-05-27 LAB — PROCALCITONIN: PROCALCITONIN: 2.92 ng/mL

## 2015-05-27 MED ORDER — ADULT MULTIVITAMIN W/MINERALS CH
1.0000 | ORAL_TABLET | Freq: Every day | ORAL | Status: DC
  Administered 2015-05-27 – 2015-05-31 (×5): 1
  Filled 2015-05-27 (×6): qty 1

## 2015-05-27 MED ORDER — VITAL HIGH PROTEIN PO LIQD
1000.0000 mL | ORAL | Status: DC
  Administered 2015-05-27 – 2015-05-30 (×4): 1000 mL

## 2015-05-27 MED ORDER — VITAL HIGH PROTEIN PO LIQD: 1000.0000 mL | ORAL | Status: DC

## 2015-05-27 MED ORDER — LEVOFLOXACIN IN D5W 750 MG/150ML IV SOLN
750.0000 mg | Freq: Every day | INTRAVENOUS | Status: DC
  Administered 2015-05-27 – 2015-06-01 (×6): 750 mg via INTRAVENOUS
  Filled 2015-05-27 (×7): qty 150

## 2015-05-27 MED ORDER — PRO-STAT SUGAR FREE PO LIQD
60.0000 mL | Freq: Three times a day (TID) | ORAL | Status: DC
  Administered 2015-05-27 – 2015-05-30 (×11): 60 mL
  Filled 2015-05-27 (×11): qty 60

## 2015-05-27 NOTE — Progress Notes (Signed)
ANTICOAGULATION CONSULT NOTE - Follow Up Consult  Pharmacy Consult for Heparin (apixaban on hold) Indication: atrial fibrillation  Allergies  Allergen Reactions  . Codeine     Red blotches  . Penicillins     Childhood reaction    Patient Measurements: Height: 5\' 11"  (180.3 cm) Weight: 228 lb 6.3 oz (103.6 kg) IBW/kg (Calculated) : 75.3  Vital Signs: Temp: 98.8 F (37.1 C) (04/28 0400) Temp Source: Oral (04/28 0400) BP: 108/71 mmHg (04/28 0400) Pulse Rate: 95 (04/28 0400)  Labs:  Recent Labs  05/26/15 1410 05/26/15 2109 05/26/15 2200 05/27/15 0311  HGB 11.1*  --   --  9.1*  HCT 36.4*  --   --  30.6*  PLT 373  --   --  296  APTT  --  42*  --  49*  HEPARINUNFRC  --  >2.20*  --   --   CREATININE 1.15  --   --  1.31*  TROPONINI 0.03 <0.03 <0.03 <0.03    Estimated Creatinine Clearance: 74.4 mL/min (by C-G formula based on Cr of 1.31).   Assessment: PTA Apixaban on hold, currently on heparin, aPTT is low, using aPTT to dose for now given apixaban influence on anti-Xa levels, no issues per RN.   Goal of Therapy:  Heparin level 0.3-0.7 units/ml aPTT 66-102 seconds Monitor platelets by anticoagulation protocol: Yes   Plan:  -Increase heparin drip to 1700 units/hr -1330 aPTT -Daily CBC/HL/aPTT -Monitor for bleeding  Narda Bonds 05/27/2015,6:16 AM

## 2015-05-27 NOTE — Progress Notes (Signed)
RT called [facility]Link about morning ABG. Panic result of 68.6 CO2

## 2015-05-27 NOTE — Procedures (Signed)
[place] chest bilat  1. bialteral extremely small effusions, equal, no fibrinous material 2. Lung wnl  Lavon Paganini. Titus Mould, MD, [place] Pgr: [place] [facility]

## 2015-05-27 NOTE — Progress Notes (Signed)
ANTICOAGULATION CONSULT NOTE - Follow Up Consult  Pharmacy Consult for Heparin (apixaban on hold) Indication: atrial fibrillation  Allergies  Allergen Reactions  . Codeine     Red blotches  . Penicillins     Childhood reaction    Patient Measurements: Height: 5\' 11"  (180.3 cm) Weight: 228 lb 6.3 oz (103.6 kg) IBW/kg (Calculated) : 75.3  Vital Signs: Temp: 99.7 F (37.6 C) (04/28 2000) Temp Source: Oral (04/28 2000) BP: 106/69 mmHg (04/28 2100) Pulse Rate: 87 (04/28 2100)  Labs:  Recent Labs  05/26/15 1410  05/26/15 2109 05/26/15 2200 05/27/15 0311 05/27/15 1304 05/27/15 2150  HGB 11.1*  --   --   --  9.1*  --   --   HCT 36.4*  --   --   --  30.6*  --   --   PLT 373  --   --   --  296  --   --   APTT  --   < > 42*  --  49* 47* 76*  HEPARINUNFRC  --   --  >2.20*  --   --   --   --   CREATININE 1.15  --   --   --  1.31*  --   --   TROPONINI 0.03  --  <0.03 <0.03 <0.03 0.06*  --   < > = values in this interval not displayed.  Estimated Creatinine Clearance: 74.4 mL/min (by C-G formula based on Cr of 1.31).   Assessment: PTA Apixaban on hold, currently on heparin, aPTT is therapeutic at  76 seconds after rate increased to 1900 units/hr.  We are using aPTT to dose for now given apixaban influence on anti-Xa levels, no issues per RN. Hgb down but no bleeding or IV issues noted.  Goal of Therapy:  Heparin level 0.3-0.7 units/ml aPTT 66-102 seconds Monitor platelets by anticoagulation protocol: Yes   Plan:  -continue heparin drip at 190 units/hr -Daily CBC/HL/aPTT -Monitor for bleeding  Eudelia Bunch, Pharm.D. [id number] 05/27/2015 10:36 PM

## 2015-05-27 NOTE — Progress Notes (Signed)
Initial Nutrition Assessment  DOCUMENTATION CODES:   Obesity unspecified  INTERVENTION:   Initaite Vital High Protein @ 35 ml/hr 60 ml Prostat TID MVI daily  Provides: 1440 kcal, 163 grams protein, and 702 ml H2O.   NUTRITION DIAGNOSIS:   Inadequate oral intake related to inability to eat as evidenced by NPO status.  GOAL:   Provide needs based on ASPEN/SCCM guidelines  MONITOR:   Vent status, TF tolerance, I & O's  REASON FOR ASSESSMENT:   Consult, Ventilator Enteral/tube feeding initiation and management  ASSESSMENT:   60yo male with hx COPD, CAD, AFib, combined CHF presented [facility] ER 4/27 with 2 day hx SOB. He initially sought care at his PCP's office who sent him urgently to ER with respiratory distress. Pt with worsening respiratory acidosis and lethargy despite bipap attempts in ER and was intubated and tx to [facility] with AECOPD and probable HCAP. Had recent admission 01/31/15 through 02/02/15 for AECOPD  Patient is currently intubated on ventilator support MV: 11.6 L/min Temp (24hrs), Avg:98.7 F (37.1 C), Min:96.1 F (35.6 C), Max:99.7 F (37.6 C)  Propofol: 12.5 ml/hr provides: 330 kcal per day  Daughter at bedside and reports good intake PTA. Pt lives with wife but is usually home alone mostly. He can cook for himself, uses crock pot and does shop some on his own. Requires O2.  Nutrition-Focused physical exam completed. Findings are no fat depletion, no muscle depletion, and no edema.  CBG's: 153 OG tube  Diet Order:  Diet NPO time specified  Skin:  Reviewed, no issues  Last BM:  unknown  Height:   Ht Readings from Last 1 Encounters:  05/26/15 5\' 11"  (1.803 m)    Weight:   Wt Readings from Last 1 Encounters:  05/27/15 228 lb 6.3 oz (103.6 kg)    Ideal Body Weight:  78.1 kg  BMI:  Body mass index is 31.87 kg/(m^2).  Estimated Nutritional Needs:   Kcal:  1139-1450  Protein:  >/= 156 grams  Fluid:  >1.5 L/day  EDUCATION  NEEDS:   No education needs identified at this time  [place], [place], [place] Pager [phone] After Hours Pager

## 2015-05-27 NOTE — H&P (Signed)
PULMONARY / CRITICAL CARE MEDICINE   Name: Benjamin [name] MRN: [id number] DOB: Aug 06, 1955    ADMISSION DATE:  05/26/2015  REFERRING MD:  Forestine Na   CHIEF COMPLAINT:  Respiratory failure   HISTORY OF PRESENT ILLNESS:   60yo male with hx COPD, CAD, AFib, combined CHF presented to [facility] ER 4/27 with 2 day hx SOB.  He initially sought care at his PCP's office who sent him urgently to ER with respiratory distress.  Pt with worsening respiratory acidosis and lethargy despite bipap attempts in ER and was intubated and tx to [facility] with AECOPD and probable HCAP.  Had recent admission 01/31/15 through 02/02/15 for AECOPD, discharged on azithro and prednisone taper.  SUBJECTIVE:  On vent   VITAL SIGNS: BP 107/73 mmHg  Pulse 92  Temp(Src) 99.5 F (37.5 C) (Oral)  Resp 20  Ht 5\' 11"  (1.803 m)  Wt 103.6 kg (228 lb 6.3 oz)  BMI 31.87 kg/m2  SpO2 95%  HEMODYNAMICS:    VENTILATOR SETTINGS: Vent Mode:  [-] PRVC FiO2 (%):  [50 %-100 %] 50 % Set Rate:  [14 bmp-20 bmp] 20 bmp Vt Set:  [500 mL-600 mL] 600 mL PEEP:  [5 cmH20] 5 cmH20 Plateau Pressure:  [19 cmH20-24 cmH20] 19 cmH20  INTAKE / OUTPUT: I/O last 3 completed shifts: In: 762.4 [I.V.:412.4; NG/GT:100; IV Piggyback:250] Out: [id number] [Urine:1650]  PHYSICAL EXAMINATION: General: Adult male, resting in bed on vent Neuro: Sedated, comfortable,following commands. HEENT: [place]/AT. PERRL, sclerae anicteric. Cardiovascular: RRR, no M/R/G.  Lungs:ronchi Abdomen: BS x 4, soft, NT/ND.  Musculoskeletal: No gross deformities, no edema.  Skin: Intact, warm, no rashes.    LABS:  BMET  Recent Labs Lab 05/26/15 1410 05/27/15 0311  NA 141 142  K 4.6 4.2  CL 97* 100*  CO2 32 29  BUN 29* 27*  CREATININE 1.15 1.31*  GLUCOSE 175* 163*    Electrolytes  Recent Labs Lab 05/26/15 1410 05/27/15 0311  CALCIUM 7.9* 7.9*  MG  --  2.2  PHOS  --  2.5    CBC  Recent Labs Lab 05/26/15 1410 05/27/15 0311  WBC 18.8* 11.8*   HGB 11.1* 9.1*  HCT 36.4* 30.6*  PLT 373 296    Coag's  Recent Labs Lab 05/26/15 2109 05/27/15 0311  APTT 42* 49*    Sepsis Markers  Recent Labs Lab 05/26/15 1430 05/26/15 2109 05/26/15 2200 05/27/15 0311  LATICACIDVEN 1.1  --  1.2  --   PROCALCITON  --  3.02  --  2.92    ABG  Recent Labs Lab 05/26/15 1420 05/26/15 1720 05/27/15 0348  PHART 7.140* 7.256* 7.306*  PCO2ART CRITICAL RESULT CALLED TO, READ BACK BY AND VERIFIED WITH: 67.3* 68.6*  PO2ART 353* 101* 180.0*    Liver Enzymes No results for input(s): AST, ALT, ALKPHOS, BILITOT, ALBUMIN in the last 168 hours.  Cardiac Enzymes  Recent Labs Lab 05/26/15 2109 05/26/15 2200 05/27/15 0311  TROPONINI <0.03 <0.03 <0.03    Glucose  Recent Labs Lab 05/26/15 2129 05/27/15 0043 05/27/15 0331 05/27/15 0801  GLUCAP 133* 164* 162* 153*    Imaging Dg Chest Port 1 View  05/27/2015  CLINICAL DATA:  Respiratory failure. EXAM: PORTABLE CHEST 1 VIEW COMPARISON:  05/26/2015 and 03/08/2015 FINDINGS: Endotracheal tube and OG tube appear unchanged. Severe emphysematous disease is again noted. There has been slight improvement in the infiltrate at the right lung base. No significant change of the infiltrate in the left base, both superimposed on chronic lung disease. IMPRESSION:  Severe emphysema with bibasilar infiltrates, slightly improved on the right. Electronically Signed   By: Lorriane Shire M.D.   On: 05/27/2015 07:19   Dg Chest Port 1 View  05/26/2015  CLINICAL DATA:  Endotracheal tube adjustment EXAM: PORTABLE CHEST 1 VIEW COMPARISON:  05/26/2015 FINDINGS: Advanced endotracheal tube, tip in good position between the clavicular heads and carina. An orogastric tube reaches the stomach at least. Emphysema with bibasilar pneumonia. Stable heart size and mediastinal contours, distorted by rightward rotation. No effusion or air leak. Remote appearing left posterior third rib fracture IMPRESSION: 1. Endotracheal and  orogastric tubes are in good position. 2. Emphysema and bibasilar pneumonia. Electronically Signed   By: Monte Fantasia M.D.   On: 05/26/2015 23:50   Dg Chest Portable 1 View  05/26/2015  CLINICAL DATA:  ET TUBE PLACEMENT, OG TUBE PLACEMENT EXAM: PORTABLE CHEST 1 VIEW COMPARISON:  05/26/2015 at 1349 hours FINDINGS: New endotracheal tube has its tip projecting 5.6 cm above the carina. Nasal/orogastric tube passes below the diaphragm into the stomach. New Changes of emphysema and lung base airspace interstitial opacities are without change from the earlier study. IMPRESSION: 1. Endotracheal tube tip projects 5.6 cm above the carina. 2. Nasal/orogastric tube is well positioned passing into the stomach. Electronically Signed   By: Lajean Manes M.D.   On: 05/26/2015 15:35   Dg Chest Portable 1 View  05/26/2015  CLINICAL DATA:  Shortness of breath for 2 days EXAM: PORTABLE CHEST 1 VIEW COMPARISON:  03/08/2015 FINDINGS: Cardiac shadow is stable. Diffuse emphysematous changes are seen. Increased density is noted in the bases bilaterally consistent with some bibasilar atelectasis/infiltrate. No definitive effusion or pneumothorax is noted. IMPRESSION: Emphysematous changes with bibasilar opacities Electronically Signed   By: Inez Catalina M.D.   On: 05/26/2015 13:57     STUDIES:  CXR 04/27 > emphysematous changes.  CULTURES: Sputum 4/27>>> BC x 2 4/27>>> Urine 4/27>>>  ANTIBIOTICS: Vanc 4/27>>> Aztreonam 4/27>>> Levofloxacin 4/28>>>  SIGNIFICANT EVENTS: 4/27- ett placed  LINES/TUBES: ETT 4/27>>>  DISCUSSION: 60 y.o. M admitted with AECOPD and possible HCAP, failed BiPAP and required intubation.  ASSESSMENT / PLAN:  PULMONARY A: Acute on chronic respiratory failure. AECOPD. HCAP. P:   Even to neg balance goals Ph noted, increase MV if able, no higher rr 24 with copd Abx per ID section. Solumedrol maintain DuoNebs / Budesonide / Albuterol. CXR in AM. -hold CT chest, follow pcxr  findings, may require -we will do [place] bedside weaning has started, cpap 5 ps5, goal 2 hrs  CARDIOVASCULAR A: Combined CHF - last echo from 04/13/13 with EF 40-45%, grade 1DD. Hx CAD, AFib. P:  Repeat troponin neg , dc Consider repeat echo pending Heparin gtt in lieu of outpatient eliquis. Continue outpatient plavix, atorvastatin. Hold outpatient eliquis, bisoprolol, lasix, losartan.  RENAL A: AKI - mild (aseline Scr ~0.8) P:   NS @ 100, keep Dc lasix Assess ionized calcium. BMP in AM  GASTROINTESTINAL A: GI prophylaxis. Nutrition. P:   SUP: pantoprazole. Start TF  HEMATOLOGIC A: Anemia - mild. VTE prophylaxis. P:  Transfuse for hgb < 7. SCD's / heparin gtt. CBC in AM.  INFECTIOUS A: CAP (last hosp stay jan) PCN allergy P:   Abx as above (vanc / aztreonam). Follow cultures. PCT algorithm to limit abx exposure. Add atypical levofloxacin  ENDOCRINE A: Hyperglycemia - no hx DM. P:   SSI  NEUROLOGIC A: AMS r/t hypercarbia  P:   Sedation: Propofol gtt / fentanyl PRN. RASS goal: 0 to -  1. Daily WUA. Hold outpatient gabapentin.   FAMILY  Updates:  I updated family in room  Inter-disciplinary family meet or Palliative Care meeting due by:  06/01/15.   CC time: 35 min  Lavon Paganini. Titus Mould, MD, [place] Pgr: [place] [facility]

## 2015-05-27 NOTE — Progress Notes (Signed)
ANTICOAGULATION CONSULT NOTE - Follow Up Consult  Pharmacy Consult for Heparin (apixaban on hold) Indication: atrial fibrillation  Allergies  Allergen Reactions  . Codeine     Red blotches  . Penicillins     Childhood reaction    Patient Measurements: Height: 5\' 11"  (180.3 cm) Weight: 228 lb 6.3 oz (103.6 kg) IBW/kg (Calculated) : 75.3  Vital Signs: Temp: 99.5 F (37.5 C) (04/28 0800) Temp Source: Oral (04/28 0800) BP: 107/75 mmHg (04/28 1205) Pulse Rate: 95 (04/28 1205)  Labs:  Recent Labs  05/26/15 1410 05/26/15 2109 05/26/15 2200 05/27/15 0311 05/27/15 1304  HGB 11.1*  --   --  9.1*  --   HCT 36.4*  --   --  30.6*  --   PLT 373  --   --  296  --   APTT  --  42*  --  49* 47*  HEPARINUNFRC  --  >2.20*  --   --   --   CREATININE 1.15  --   --  1.31*  --   TROPONINI 0.03 <0.03 <0.03 <0.03 0.06*    Estimated Creatinine Clearance: 74.4 mL/min (by C-G formula based on Cr of 1.31).   Assessment: PTA Apixaban on hold, currently on heparin, aPTT is low, using aPTT to dose for now given apixaban influence on anti-Xa levels, no issues per RN.   Hgb down but no bleeding or IV issues noted.  Goal of Therapy:  Heparin level 0.3-0.7 units/ml aPTT 66-102 seconds Monitor platelets by anticoagulation protocol: Yes   Plan:  -Increase heparin drip to 190 units/hr -2100 aptt -Daily CBC/HL/aPTT -Monitor for bleeding  Erin Hearing PharmD., BCPS Clinical Pharmacist Pager [phone] 05/27/2015 2:55 PM

## 2015-05-28 ENCOUNTER — Inpatient Hospital Stay (HOSPITAL_COMMUNITY): Payer: Medicare Other

## 2015-05-28 ENCOUNTER — Other Ambulatory Visit (HOSPITAL_COMMUNITY): Payer: Medicare Other

## 2015-05-28 LAB — POCT I-STAT 3, ART BLOOD GAS (G3+)
Acid-Base Excess: 8 mmol/L — ABNORMAL HIGH (ref 0.0–2.0)
Bicarbonate: 36.1 mEq/L — ABNORMAL HIGH (ref 20.0–24.0)
O2 Saturation: 94 %
PCO2 ART: 66.6 mmHg — AB (ref 35.0–45.0)
PO2 ART: 77 mmHg — AB (ref 80.0–100.0)
Patient temperature: 98.6
TCO2: 38 mmol/L (ref 0–100)
pH, Arterial: 7.342 — ABNORMAL LOW (ref 7.350–7.450)

## 2015-05-28 LAB — BASIC METABOLIC PANEL
Anion gap: 10 (ref 5–15)
BUN: 33 mg/dL — ABNORMAL HIGH (ref 6–20)
CHLORIDE: 99 mmol/L — AB (ref 101–111)
CO2: 32 mmol/L (ref 22–32)
CREATININE: 0.94 mg/dL (ref 0.61–1.24)
Calcium: 8.7 mg/dL — ABNORMAL LOW (ref 8.9–10.3)
GFR calc non Af Amer: >60 mL/min (ref >60)
GLUCOSE: 202 mg/dL — AB (ref 65–99)
Potassium: 4.4 mmol/L (ref 3.5–5.1)
Sodium: 141 mmol/L (ref 135–145)

## 2015-05-28 LAB — APTT
APTT: 51 s — AB (ref 24–37)
APTT: 61 s — AB (ref 24–37)
aPTT: 141 seconds — ABNORMAL HIGH (ref 24–37)

## 2015-05-28 LAB — GLUCOSE, CAPILLARY
GLUCOSE-CAPILLARY: 215 mg/dL — AB (ref 65–99)
GLUCOSE-CAPILLARY: 193 mg/dL — AB (ref 65–99)
Glucose-Capillary: 185 mg/dL — ABNORMAL HIGH (ref 65–99)
Glucose-Capillary: 221 mg/dL — ABNORMAL HIGH (ref 65–99)
Glucose-Capillary: 220 mg/dL — ABNORMAL HIGH (ref 65–99)
Glucose-Capillary: 195 mg/dL — ABNORMAL HIGH (ref 65–99)

## 2015-05-28 LAB — CBC
HEMATOCRIT: 31.8 % — AB (ref 39.0–52.0)
Hemoglobin: 9.2 g/dL — ABNORMAL LOW (ref 13.0–17.0)
MCH: 27.7 pg (ref 26.0–34.0)
MCHC: 28.9 g/dL — ABNORMAL LOW (ref 30.0–36.0)
MCV: 95.8 fL (ref 78.0–100.0)
Platelets: 354 10*3/uL (ref 150–400)
RBC: 3.32 MIL/uL — AB (ref 4.22–5.81)
RDW: 15.1 % (ref 11.5–15.5)
WBC: 13.1 10*3/uL — AB (ref 4.0–10.5)

## 2015-05-28 LAB — URINE CULTURE
CULTURE: NO GROWTH
Culture: NO GROWTH
Special Requests: NORMAL

## 2015-05-28 LAB — HEMOGLOBIN A1C
HEMOGLOBIN A1C: 6.5 % — AB (ref 4.8–5.6)
Mean Plasma Glucose: 140 mg/dL

## 2015-05-28 LAB — PHOSPHORUS: Phosphorus: 2.2 mg/dL — ABNORMAL LOW (ref 2.5–4.6)

## 2015-05-28 LAB — HEPARIN LEVEL (UNFRACTIONATED)
HEPARIN UNFRACTIONATED: 0.75 [IU]/mL — AB (ref 0.30–0.70)
Heparin Unfractionated: 1.2 IU/mL — ABNORMAL HIGH (ref 0.30–0.70)

## 2015-05-28 LAB — PROCALCITONIN: Procalcitonin: 1.73 ng/mL

## 2015-05-28 LAB — MAGNESIUM: Magnesium: 2.9 mg/dL — ABNORMAL HIGH (ref 1.7–2.4)

## 2015-05-28 MED ORDER — VANCOMYCIN HCL IN DEXTROSE 1-5 GM/200ML-% IV SOLN
1000.0000 mg | Freq: Three times a day (TID) | INTRAVENOUS | Status: DC
  Administered 2015-05-28 – 2015-05-30 (×6): 1000 mg via INTRAVENOUS
  Filled 2015-05-28 (×8): qty 200

## 2015-05-28 MED ORDER — FENTANYL CITRATE (PF) 100 MCG/2ML IJ SOLN
50.0000 ug | Freq: Once | INTRAMUSCULAR | Status: AC
  Administered 2015-05-28: 50 ug via INTRAVENOUS
  Filled 2015-05-28: qty 2

## 2015-05-28 MED ORDER — FENTANYL BOLUS VIA INFUSION
25.0000 ug | INTRAVENOUS | Status: DC | PRN
  Administered 2015-05-28: 50 ug via INTRAVENOUS
  Filled 2015-05-28: qty 50

## 2015-05-28 MED ORDER — SODIUM CHLORIDE 0.9 % IV SOLN
25.0000 ug/h | INTRAVENOUS | Status: DC
  Administered 2015-05-28: 25 ug/h via INTRAVENOUS
  Administered 2015-05-29: 100 ug/h via INTRAVENOUS
  Administered 2015-05-30: 150 ug/h via INTRAVENOUS
  Filled 2015-05-28 (×4): qty 50

## 2015-05-28 NOTE — Progress Notes (Addendum)
Vent changes made per MD for pt synchrony with vent. RT will obtain abg in 1hr

## 2015-05-28 NOTE — Progress Notes (Signed)
PULMONARY / CRITICAL CARE MEDICINE   Name: Benjamin [name] MRN: [id number] DOB: January 07, 1956    ADMISSION DATE:  05/26/2015  REFERRING MD:  Forestine Na   CHIEF COMPLAINT:  Respiratory failure   HISTORY OF PRESENT ILLNESS:   60 y/o male with hx COPD, CAD, AFib, combined CHF presented to [facility] ER 4/27 with 2 day hx SOB.  He initially sought care at his PCP's office who sent him urgently to ER with respiratory distress.  Pt with worsening respiratory acidosis and lethargy despite bipap attempts in ER.  He was intubated and tx to [facility] with AECOPD and probable HCAP.  Had recent admission 01/31/15 - 02/02/15 for AECOPD, discharged on azithro and prednisone taper.  SUBJECTIVE:   RT / RN report significant vent dyssynchrony on PRVC.  Pt on propofol for sedation (RASS -1).  He failed SBT earlier am due to tachypnea   VITAL SIGNS: BP 107/74 mmHg  Pulse 63  Temp(Src) 97.2 F (36.2 C) (Core (Comment))  Resp 20  Ht 5\' 11"  (1.803 m)  Wt 225 lb 15.5 oz (102.5 kg)  BMI 31.53 kg/m2  SpO2 96%  HEMODYNAMICS:    VENTILATOR SETTINGS: Vent Mode:  [-] PRVC FiO2 (%):  [40 %] 40 % Set Rate:  [20 bmp] 20 bmp Vt Set:  [600 mL] 600 mL PEEP:  [5 cmH20] 5 cmH20 Plateau Pressure:  [20 cmH20-26 cmH20] 23 cmH20  INTAKE / OUTPUT: I/O last 3 completed shifts: In: 2854.5 [I.V.:1356.2; NG/GT:648.3; IV Piggyback:850] Out: 2375 [Urine:2375]  PHYSICAL EXAMINATION: General: obese male in NAD, lying in bed Neuro: Sedate but arouses to voice, appears comfortable, following commands. HEENT: [place]/AT. PERRL, sclerae anicteric. Cardiovascular: RRR, no M/R/G.  Lungs: even/non-labored, lungs bilaterally coarse Abdomen: BSx4 active, large abd  Musculoskeletal: No gross deformities, no edema.  Skin: Intact, warm, no rashes.    LABS:  BMET  Recent Labs Lab 05/26/15 1410 05/27/15 0311 05/28/15 0325  NA 141 142 141  K 4.6 4.2 4.4  CL 97* 100* 99*  CO2 32 29 32  BUN 29* 27* 33*  CREATININE 1.15 1.31*  0.94  GLUCOSE 175* 163* 202*    Electrolytes  Recent Labs Lab 05/26/15 1410 05/27/15 0311 05/28/15 0325  CALCIUM 7.9* 7.9* 8.7*  MG  --  2.2 2.9*  PHOS  --  2.5 2.2*    CBC  Recent Labs Lab 05/26/15 1410 05/27/15 0311 05/28/15 0325  WBC 18.8* 11.8* 13.1*  HGB 11.1* 9.1* 9.2*  HCT 36.4* 30.6* 31.8*  PLT 373 296 354    Coag's  Recent Labs Lab 05/27/15 1304 05/27/15 2150 05/28/15 0325  APTT 47* 76* 51*    Sepsis Markers  Recent Labs Lab 05/26/15 1430 05/26/15 2109 05/26/15 2200 05/27/15 0311 05/28/15 0325  LATICACIDVEN 1.1  --  1.2  --   --   PROCALCITON  --  3.02  --  2.92 1.73    ABG  Recent Labs Lab 05/26/15 1420 05/26/15 1720 05/27/15 0348  PHART 7.140* 7.256* 7.306*  PCO2ART CRITICAL RESULT CALLED TO, READ BACK BY AND VERIFIED WITH: 67.3* 68.6*  PO2ART 353* 101* 180.0*    Liver Enzymes No results for input(s): AST, ALT, ALKPHOS, BILITOT, ALBUMIN in the last 168 hours.  Cardiac Enzymes  Recent Labs Lab 05/26/15 2200 05/27/15 0311 05/27/15 1304  TROPONINI <0.03 <0.03 0.06*    Glucose  Recent Labs Lab 05/27/15 1220 05/27/15 1625 05/27/15 2043 05/27/15 2354 05/28/15 0450 05/28/15 0826  GLUCAP 153* 137* 154* 185* 215* 221*  Imaging No results found.   STUDIES:  CXR 4/27 > emphysematous changes.  CULTURES: Sputum 4/27 >> BC x 2 4/27 >> Urine 4/27 >>  ANTIBIOTICS: Vanc 4/27 >> Aztreonam 4/27 >> Levofloxacin 4/28 >>  SIGNIFICANT EVENTS: 4/27- ett placed  LINES/TUBES: ETT 4/27 >>  DISCUSSION: 60 y.o. M admitted with AECOPD and possible HCAP, failed BiPAP and required intubation.  ASSESSMENT / PLAN:  PULMONARY A: Acute on chronic hypoxic / hypercarbic respiratory failure - followed by Dr. Luan Pulling, on O2 at baseline AECOPD. HCAP. P:   Change to PCV am 4/29, ABG in one hour Abx per ID section Solumedrol 40 mg IV Q8 DuoNebs / Budesonide / Albuterol. Intermittent CXR Daily SBT /  WUA  CARDIOVASCULAR A: Combined CHF - last echo from 04/13/13 with EF 40-45%, grade 1DD. Hx CAD, AFib. P:  Heparin gtt in lieu of outpatient eliquis. ECHO pending >> Continue outpatient plavix, atorvastatin Hold outpatient eliquis, bisoprolol, lasix, losartan  RENAL A: AKI - mild, baseline Scr ~0.8 P:   NS @ 100, keep BMP in AM  GASTROINTESTINAL A: GI prophylaxis Nutrition P:   SUP: pantoprazole. TF per nutrition   HEMATOLOGIC A: Anemia - mild Chronic Anticoagulation secondary to AF - apixaban as outpatient VTE prophylaxis P:  Transfuse for hgb < 7 SCD's / heparin gtt  Trend CBC   INFECTIOUS A: CAP - last hospitalization in Jan 2017 PCN allergy P:   Abx as above (vanc / aztreonam / levaquin) Follow cultures PCT algorithm to limit abx exposure  ENDOCRINE A: Hyperglycemia - no hx DM. P:   SSI   NEUROLOGIC A: AMS - in setting of hypercarbia, resolving P:   Sedation: Propofol gtt / fentanyl PRN RASS goal: 0 to -1 Daily WUA Hold outpatient gabapentin   FAMILY  Updates:   Daughter updated at bedside.    Inter-disciplinary family meet or Palliative Care meeting due by:  06/01/15.    Noe Gens, NP-C [facility] Pgr: [phone] or if no answer [phone] 05/28/2015, 12:05 PM   Attending note: I have seen and examined the patient with nurse practitioner/resident and agree with the note. History, labs and imaging reviewed.  60 Y/O with VDRF from HCAP, AECOPD.  Remains ventilated Continue abx, steroids, nebs. Daily wake up and breath assessments/  Critical care time- 35 mins.  Marshell Garfinkel MD [facility] Pager [phone] If no answer or after 3pm call: [phone] 05/28/2015, 6:39 PM

## 2015-05-28 NOTE — Progress Notes (Signed)
[facility] Progress Note Patient Name: INDIGO EMHOFF DOB: [birth year] MRN: [id number]   Date of Service  05/28/2015  HPI/Events of Note  Camera check on patient  At nurses request requiring frequent bolus doses of fentanyl. ABG shows chronic hypercarbia on new ventilator settings. Appropriately compensated. Patient laying  Slightly on his left side. Intermittent vent alarm.  eICU Interventions  1. DC previous fentanyl orders 2. Start fentanyl drip 3. Fentanyl bolus via infusion     Intervention Category Major Interventions: Respiratory failure - evaluation and management  Tera Partridge 05/28/2015, 5:01 PM

## 2015-05-28 NOTE — Progress Notes (Signed)
ANTICOAGULATION CONSULT NOTE - Follow Up Consult  Pharmacy Consult for Heparin (apixaban on hold) Indication: atrial fibrillation  Allergies  Allergen Reactions  . Codeine     Red blotches  . Penicillins     Childhood reaction    Patient Measurements: Height: 5\' 11"  (180.3 cm) Weight: 225 lb 15.5 oz (102.5 kg) IBW/kg (Calculated) : 75.3  Vital Signs: Temp: 97.9 F (36.6 C) (04/29 0600) Temp Source: Core (Comment) (04/29 0500) BP: 114/79 mmHg (04/29 0600) Pulse Rate: 70 (04/29 0600)  Labs:  Recent Labs  05/26/15 1410  05/26/15 2109 05/26/15 2200 05/27/15 0311 05/27/15 1304 05/27/15 2150 05/28/15 0325  HGB 11.1*  --   --   --  9.1*  --   --  9.2*  HCT 36.4*  --   --   --  30.6*  --   --  31.8*  PLT 373  --   --   --  296  --   --  354  APTT  --   < > 42*  --  49* 47* 76* 51*  HEPARINUNFRC  --   --  >2.20*  --   --   --   --  1.20*  CREATININE 1.15  --   --   --  1.31*  --   --  0.94  TROPONINI 0.03  --  <0.03 <0.03 <0.03 0.06*  --   --   < > = values in this interval not displayed.  Estimated Creatinine Clearance: 103.2 mL/min (by C-G formula based on Cr of 0.94).   Assessment: PTA Apixaban on hold, currently on heparin.  We are using aPTT to dose for now given apixaban influence on anti-Xa levels, no issues per RN. Hgb down but no bleeding or IV issues noted.  AM aPTT subtherapeutic at 55 seconds. Will increase rate and check 6 hr level.   Goal of Therapy:  Heparin level 0.3-0.7 units/ml aPTT 66-102 seconds Monitor platelets by anticoagulation protocol: Yes   Plan:  -increase heparin to 2100 units/hr  -6 hr aPTT/HL -Daily CBC/HL/aPTT -Monitor for bleeding  Rasul Decola C. Lennox Grumbles, PharmD Pharmacy Resident  Pager: [phone] 05/28/2015 7:21 AM

## 2015-05-28 NOTE — Progress Notes (Addendum)
ANTIBIOTIC CONSULT NOTE - Follow-up  Pharmacy Consult for aztreonam/vancomycin/heparin Indication:  HCAP/afib  Allergies  Allergen Reactions  . Codeine     Red blotches  . Penicillins     Childhood reaction    Patient Measurements: Height: 5' 11"  (180.3 cm) Weight: 225 lb 15.5 oz (102.5 kg) IBW/kg (Calculated) : 75.3 Heparin dosing weight = 97 kg  Vital Signs: Temp: 97.9 F (36.6 C) (04/29 0600) Temp Source: Core (Comment) (04/29 0500) BP: 131/87 mmHg (04/29 0809) Pulse Rate: 78 (04/29 0822) Intake/Output from previous day: 04/28 0701 - 04/29 0700 In: 2022.2 [I.V.:908.9; NG/GT:513.3; IV Piggyback:600] Out: 1450 [Urine:1450]  Labs:  Recent Labs  05/26/15 1410 05/27/15 0311 05/28/15 0325  WBC 18.8* 11.8* 13.1*  HGB 11.1* 9.1* 9.2*  PLT 373 296 354  CREATININE 1.15 1.31* 0.94   Estimated Creatinine Clearance: 103.2 mL/min (by C-G formula based on Cr of 0.94). No results for input(s): VANCOTROUGH, VANCOPEAK, VANCORANDOM, GENTTROUGH, GENTPEAK, GENTRANDOM, TOBRATROUGH, TOBRAPEAK, TOBRARND, AMIKACINPEAK, AMIKACINTROU, AMIKACIN in the last 72 hours.   Microbiology: Recent Results (from the past 720 hour(s))  Urine culture     Status: None (Preliminary result)   Collection Time: 05/26/15  2:00 PM  Result Value Ref Range Status   Specimen Description URINE, CATHETERIZED  Final   Special Requests NONE  Final   Culture   Final    NO GROWTH < 24 HOURS Performed at [facility]    Report Status PENDING  Incomplete  MRSA PCR Screening     Status: None   Collection Time: 05/26/15  7:34 PM  Result Value Ref Range Status   MRSA by PCR NEGATIVE NEGATIVE Final    Comment:        The GeneXpert MRSA Assay (FDA approved for NASAL specimens only), is one component of a comprehensive MRSA colonization surveillance program. It is not intended to diagnose MRSA infection nor to guide or monitor treatment for MRSA infections.   Urine culture     Status: None  (Preliminary result)   Collection Time: 05/26/15  8:24 PM  Result Value Ref Range Status   Specimen Description URINE, CATHETERIZED  Final   Special Requests Normal  Final   Culture NO GROWTH < 24 HOURS  Final   Report Status PENDING  Incomplete  Culture, blood (Routine X 2) w Reflex to ID Panel     Status: None (Preliminary result)   Collection Time: 05/26/15  9:02 PM  Result Value Ref Range Status   Specimen Description BLOOD LEFT HAND  Final   Special Requests IN PEDIATRIC BOTTLE 3CC  Final   Culture NO GROWTH < 24 HOURS  Final   Report Status PENDING  Incomplete  Culture, blood (Routine X 2) w Reflex to ID Panel     Status: None (Preliminary result)   Collection Time: 05/26/15  9:09 PM  Result Value Ref Range Status   Specimen Description BLOOD LEFT ANTECUBITAL  Final   Special Requests BOTTLES DRAWN AEROBIC AND ANAEROBIC 5CC   Final   Culture NO GROWTH < 24 HOURS  Final   Report Status PENDING  Incomplete    Medical History: Past Medical History  Diagnosis Date  . Tobacco abuse     40 Pk-yr (1- 1 1/2 PPD) --> Quit 11/18/2012 when presented with STEMI  . COPD (chronic obstructive pulmonary disease) ([place])   . STEMI of inferior wall 11/18/12- RCA BMS 11/17/2012    Cardiogenic shock - post MI with RV infarction [785.51]  . CAD S/P percutaneous  coronary angioplasty  11/18/2012    - PCI mid RCA - 5.0 mm x 24 mm VeriFlex BMS (5.6 mm)  . Atrial fibrillation with RVR - peri-MI; recurrent after DCCV 11/19/2012  . Ischemic cardiomyopathy 11/19/2012    EF 40-45% with inferoseptal HK, Gr 2DD - high LVEDP/LAP. --> confirmed by f/u Echo 03/2013.  [name] Dyslipidemia- low HDL 11/19/2012  . Chronic combined systolic and diastolic heart failure, NYHA class 2 ([place]) 11/17/2012  . Colon polyp     Status post polypectomy x3.    Medications:  Scheduled:  . antiseptic oral rinse  7 mL Mouth Rinse 10 times per day  . atorvastatin  40 mg Per Tube q1800  . aztreonam  2 g Intravenous Q8H  .  budesonide (PULMICORT) nebulizer solution  0.5 mg Nebulization BID  . chlorhexidine gluconate (SAGE KIT)  15 mL Mouth Rinse BID  . clopidogrel  75 mg Per Tube Daily  . feeding supplement (PRO-STAT SUGAR FREE 64)  60 mL Per Tube TID  . feeding supplement (VITAL HIGH PROTEIN)  1,000 mL Per Tube Q24H  . insulin aspart  0-15 Units Subcutaneous Q4H  . ipratropium-albuterol  3 mL Nebulization Q4H  . levofloxacin (LEVAQUIN) IV  750 mg Intravenous Daily  . methylPREDNISolone (SOLU-MEDROL) injection  40 mg Intravenous Q8H  . multivitamin with minerals  1 tablet Per Tube Daily  . pantoprazole (PROTONIX) IV  40 mg Intravenous QHS  . vancomycin  1,000 mg Intravenous Q8H   Assessment: ID: 60 yo male with History of COPD, cardiac disease transferred from [facility] with SOB for 2 days. Chest x-ray indicated probable pneumonia. He received 1.5 g vancomycin at 1530 on 4/27 and then was started on 729m Q12H, since then his renal function has improved CrCl ~ 100 ml/min.  Vancomycin 4/27 x1 Vancomycin 4/27 >> Azithromycin 4/27 x1 Aztreonam 4/27 >> Levaquin 4/28 >>   4/27 Urine: ngtd 4/27 blood x 2: ngtd 4/27 respiratory: ngtd  Anticoagulation: Pt was on Eliquis PTA for afib, now on IV heparin, Hgb trending down to 9.2, pltc 354K. Pt is currently intubated, per family, pt took AM meds prior to admission on 4/27, so likely had a dose of eliquis that AM. HL not correlating with aPTT. aPTT 61.  Goal of Therapy:  Vancomycin trough level 15-20 mcg/ml  Heparin level 0.3-0.7 units/ml aPTT 66-102 seconds Monitor platelets by anticoagulation protocol: Yes  Plan:  Aztreonam 2g IV Q 8 hrs Increase Vancomycin to 1000 mg IV every 8 hours Vancomycin trough at steady state Monitor renal function, f/u cultures  Increase Heparin infusion to 2300 units/hr 6 hour aPTT Daily aPTT/HL   AMelburn Popper PharmD Clinical Pharmacy Resident Pager: [phone]/29/2017 8:41 AM

## 2015-05-28 NOTE — Progress Notes (Signed)
ANTICOAGULATION CONSULT NOTE - Follow Up Consult  Pharmacy Consult for Heparin (apixaban on hold) Indication: atrial fibrillation  Allergies  Allergen Reactions  . Codeine     Red blotches  . Penicillins     Childhood reaction    Patient Measurements: Height: 5\' 11"  (180.3 cm) Weight: 225 lb 15.5 oz (102.5 kg) IBW/kg (Calculated) : 75.3  Vital Signs: Temp: 97 F (36.1 C) (04/29 2100) Temp Source: Core (Comment) (04/29 2011) BP: 99/67 mmHg (04/29 2100) Pulse Rate: 64 (04/29 2100)  Labs:  Recent Labs  05/26/15 1410  05/26/15 2109 05/26/15 2200 05/27/15 0311 05/27/15 1304  05/28/15 0325 05/28/15 1316 05/28/15 1324 05/28/15 2149  HGB 11.1*  --   --   --  9.1*  --   --  9.2*  --   --   --   HCT 36.4*  --   --   --  30.6*  --   --  31.8*  --   --   --   PLT 373  --   --   --  296  --   --  354  --   --   --   APTT  --   < > 42*  --  49* 47*  < > 51*  --  61* 141*  HEPARINUNFRC  --   --  >2.20*  --   --   --   --  1.20* 0.75*  --   --   CREATININE 1.15  --   --   --  1.31*  --   --  0.94  --   --   --   TROPONINI 0.03  --  <0.03 <0.03 <0.03 0.06*  --   --   --   --   --   < > = values in this interval not displayed.  Estimated Creatinine Clearance: 103.2 mL/min (by C-G formula based on Cr of 0.94).   Assessment: PTA Apixaban on hold, currently on heparin, aPTT is high, using aPTT to dose for now given apixaban influence on anti-Xa levels, no issues per RN.   Goal of Therapy:  Heparin level 0.3-0.7 units/ml aPTT 66-102 seconds Monitor platelets by anticoagulation protocol: Yes   Plan:  -Decrease heparin drip to 2100 units/hr -0800 aPTT/HL -Monitor for bleeding  Narda Bonds 05/28/2015,11:34 PM

## 2015-05-28 NOTE — Progress Notes (Addendum)
Vent mode change per MD. Delbert Phenix and Lavaged pt - ETT sxn moderate yellow white thick

## 2015-05-28 NOTE — Progress Notes (Signed)
Per MD increase peep 5, 50% due to abg results

## 2015-05-28 NOTE — Progress Notes (Signed)
Pt failes SBT trial due to increased WOB RR 30s

## 2015-05-29 ENCOUNTER — Inpatient Hospital Stay (HOSPITAL_COMMUNITY): Payer: Medicare Other

## 2015-05-29 LAB — BASIC METABOLIC PANEL
Anion gap: 11 (ref 5–15)
BUN: 43 mg/dL — AB (ref 6–20)
CALCIUM: 8.6 mg/dL — AB (ref 8.9–10.3)
CO2: 31 mmol/L (ref 22–32)
CREATININE: 0.83 mg/dL (ref 0.61–1.24)
Chloride: 100 mmol/L — ABNORMAL LOW (ref 101–111)
GFR calc Af Amer: >60 mL/min (ref >60)
GLUCOSE: 272 mg/dL — AB (ref 65–99)
POTASSIUM: 4.4 mmol/L (ref 3.5–5.1)
SODIUM: 142 mmol/L (ref 135–145)

## 2015-05-29 LAB — CBC
HCT: 30.6 % — ABNORMAL LOW (ref 39.0–52.0)
Hemoglobin: 9 g/dL — ABNORMAL LOW (ref 13.0–17.0)
MCH: 28.3 pg (ref 26.0–34.0)
MCHC: 29.4 g/dL — AB (ref 30.0–36.0)
MCV: 96.2 fL (ref 78.0–100.0)
PLATELETS: 353 10*3/uL (ref 150–400)
RBC: 3.18 MIL/uL — AB (ref 4.22–5.81)
RDW: 15.1 % (ref 11.5–15.5)
WBC: 13.4 10*3/uL — ABNORMAL HIGH (ref 4.0–10.5)

## 2015-05-29 LAB — GLUCOSE, CAPILLARY
GLUCOSE-CAPILLARY: 249 mg/dL — AB (ref 65–99)
GLUCOSE-CAPILLARY: 270 mg/dL — AB (ref 65–99)
GLUCOSE-CAPILLARY: 228 mg/dL — AB (ref 65–99)
Glucose-Capillary: 183 mg/dL — ABNORMAL HIGH (ref 65–99)
Glucose-Capillary: 246 mg/dL — ABNORMAL HIGH (ref 65–99)
Glucose-Capillary: 250 mg/dL — ABNORMAL HIGH (ref 65–99)

## 2015-05-29 LAB — APTT
APTT: 92 s — AB (ref 24–37)
APTT: 101 s — AB (ref 24–37)

## 2015-05-29 LAB — HEPARIN LEVEL (UNFRACTIONATED): Heparin Unfractionated: 0.97 IU/mL — ABNORMAL HIGH (ref 0.30–0.70)

## 2015-05-29 LAB — ECHOCARDIOGRAM COMPLETE
HEIGHTINCHES: 71 in
Weight: 3615.54 oz

## 2015-05-29 LAB — MAGNESIUM: MAGNESIUM: 2.9 mg/dL — AB (ref 1.7–2.4)

## 2015-05-29 LAB — TRIGLYCERIDES: Triglycerides: 190 mg/dL — ABNORMAL HIGH (ref <150)

## 2015-05-29 LAB — PHOSPHORUS: Phosphorus: 2.7 mg/dL (ref 2.5–4.6)

## 2015-05-29 LAB — CALCIUM, IONIZED: Calcium, Ionized, Serum: 4.2 mg/dL — ABNORMAL LOW (ref 4.5–5.6)

## 2015-05-29 MED ORDER — POTASSIUM CHLORIDE 20 MEQ/15ML (10%) PO SOLN
20.0000 meq | Freq: Once | ORAL | Status: AC
  Administered 2015-05-29: 20 meq
  Filled 2015-05-29: qty 15

## 2015-05-29 MED ORDER — FUROSEMIDE 10 MG/ML IJ SOLN
40.0000 mg | Freq: Once | INTRAMUSCULAR | Status: AC
  Administered 2015-05-29: 40 mg via INTRAVENOUS
  Filled 2015-05-29: qty 4

## 2015-05-29 NOTE — Progress Notes (Signed)
ANTICOAGULATION CONSULT NOTE - Follow-up  Pharmacy Consult for heparin Indication: afib  Allergies  Allergen Reactions  . Codeine     Red blotches  . Penicillins     Childhood reaction   Patient Measurements: Height: 5\' 11"  (180.3 cm) Weight: 228 lb 6.3 oz (103.6 kg) IBW/kg (Calculated) : 75.3 Heparin dosing weight = 97 kg  Assessment: Pt was on Eliquis PTA for afib, now on IV heparin, Hgb trending down to 9.0, plt wnl. Pt is currently intubated, per family, pt took AM meds prior to admission on 4/27, so likely had a dose of eliquis that AM. HL not correlating with aPTT so using aPTT levels to dose for now given influence of Eliquis. aPTT therapeutic today at 92.  Repeat aPTT remains therapeutic at 101 on heparin 2100 units/hr. No issues with infusion or bleeding noted.  Goal of Therapy: Heparin level 0.3-0.7 units/ml aPTT 66-102 seconds Monitor platelets by anticoagulation protocol: Yes  Plan:  Continue Heparin infusion at 2100 units/hr Daily aPTT/HL   Andrey Cota. Diona Foley, PharmD, [place] Clinical Pharmacist Pager [phone]  05/29/2015 8:44 PM

## 2015-05-29 NOTE — Progress Notes (Addendum)
ANTICOAGULATION CONSULT NOTE - Follow-up  Pharmacy Consult for heparin Indication: afib  Allergies  Allergen Reactions  . Codeine     Red blotches  . Penicillins     Childhood reaction   Patient Measurements: Height: 5\' 11"  (180.3 cm) Weight: 228 lb 6.3 oz (103.6 kg) IBW/kg (Calculated) : 75.3 Heparin dosing weight = 97 kg  Assessment: Pt was on Eliquis PTA for afib, now on IV heparin, Hgb trending down to 9.0, plt wnl. Pt is currently intubated, per family, pt took AM meds prior to admission on 4/27, so likely had a dose of eliquis that AM. HL not correlating with aPTT so using aPTT levels to dose for now given influence of Eliquis. aPTT therapeutic today at 92.  Goal of Therapy: Heparin level 0.3-0.7 units/ml aPTT 66-102 seconds Monitor platelets by anticoagulation protocol: Yes  Plan:  Continue Heparin infusion at 2100 units/hr 6 hour aPTT to confirm Daily aPTT/HL   Melburn Popper, PharmD Clinical Pharmacy Resident Pager: [phone] 05/29/2015 11:15 AM

## 2015-05-29 NOTE — Progress Notes (Signed)
PULMONARY / CRITICAL CARE MEDICINE   Name: Benjamin [name] MRN: [id number] DOB: [birth year]    ADMISSION DATE:  05/26/2015  REFERRING MD:  Forestine Na   CHIEF COMPLAINT:  Respiratory failure   HISTORY OF PRESENT ILLNESS:   60 y/o male with hx COPD, CAD, AFib, combined CHF presented to [facility] ER 4/27 with 2 day hx SOB.  He initially sought care at his PCP's office who sent him urgently to ER with respiratory distress.  Pt with worsening respiratory acidosis and lethargy despite bipap attempts in ER.  He was intubated and tx to [facility] with AECOPD and probable HCAP.  Had recent admission 01/31/15 - 02/02/15 for AECOPD, discharged on azithro and prednisone taper.  SUBJECTIVE:   RN reports no acute events, remains on PCV. Fentanyl gtt started overnight for pain/sedation.  VITAL SIGNS: BP 106/68 mmHg  Pulse 75  Temp(Src) 97.6 F (36.4 C) (Oral)  Resp 13  Ht 5\' 11"  (1.803 m)  Wt 228 lb 6.3 oz (103.6 kg)  BMI 31.87 kg/m2  SpO2 92%  HEMODYNAMICS:    VENTILATOR SETTINGS: Vent Mode:  [-] PCV FiO2 (%):  [30 %-60 %] 40 % Set Rate:  [14 bmp-18 bmp] 14 bmp PEEP:  [3 cmH20-5 cmH20] 5 cmH20 Plateau Pressure:  [16 cmH20-27 cmH20] 24 cmH20  INTAKE / OUTPUT: I/O last 3 completed shifts: In: 4342.1 [I.V.:2022.1; NG/GT:1170; IV [id number] Out: 2870 [Urine:2870]  PHYSICAL EXAMINATION: General: obese male in NAD, lying in bed Neuro: Sedate but arouses to voice, appears comfortable, following commands HEENT: [place]/AT. PERRL, sclerae anicteric. Cardiovascular: RRR, no M/R/G.  Lungs: even/non-labored, lungs bilaterally coarse Abdomen: BSx4 active, large abd  Musculoskeletal: No gross deformities, no edema.  Skin: Intact, warm, no rashes.    LABS:  BMET  Recent Labs Lab 05/27/15 0311 05/28/15 0325 05/29/15 0500  NA 142 141 142  K 4.2 4.4 4.4  CL 100* 99* 100*  CO2 29 32 31  BUN 27* 33* 43*  CREATININE 1.31* 0.94 0.83  GLUCOSE 163* 202* 272*    Electrolytes  Recent  Labs Lab 05/27/15 0311 05/28/15 0325 05/29/15 0500  CALCIUM 7.9* 8.7* 8.6*  MG 2.2 2.9* 2.9*  PHOS 2.5 2.2* 2.7    CBC  Recent Labs Lab 05/27/15 0311 05/28/15 0325 05/29/15 0500  WBC 11.8* 13.1* 13.4*  HGB 9.1* 9.2* 9.0*  HCT 30.6* 31.8* 30.6*  PLT 296 354 353    Coag's  Recent Labs Lab 05/28/15 1324 05/28/15 2149 05/29/15 0936  APTT 61* 141* 92*    Sepsis Markers  Recent Labs Lab 05/26/15 1430 05/26/15 2109 05/26/15 2200 05/27/15 0311 05/28/15 0325  LATICACIDVEN 1.1  --  1.2  --   --   PROCALCITON  --  3.02  --  2.92 1.73    ABG  Recent Labs Lab 05/26/15 1720 05/27/15 0348 05/28/15 1543  PHART 7.256* 7.306* 7.342*  PCO2ART 67.3* 68.6* 66.6*  PO2ART 101* 180.0* 77.0*    Liver Enzymes No results for input(s): AST, ALT, ALKPHOS, BILITOT, ALBUMIN in the last 168 hours.  Cardiac Enzymes  Recent Labs Lab 05/26/15 2200 05/27/15 0311 05/27/15 1304  TROPONINI <0.03 <0.03 0.06*    Glucose  Recent Labs Lab 05/28/15 1233 05/28/15 1618 05/28/15 2031 05/29/15 0001 05/29/15 0431 05/29/15 0758  GLUCAP 220* 193* 195* 250* 249* 246*    Imaging Dg Chest Port 1 View  05/29/2015  CLINICAL DATA:  60 year old male with history of acute respiratory failure. EXAM: PORTABLE CHEST 1 VIEW COMPARISON:  Chest x-ray 05/27/2015.  FINDINGS: An endotracheal tube is in place with tip 5.2 cm above the carina. A nasogastric tube is seen extending into the stomach, however, the tip of the nasogastric tube extends below the lower margin of the image. Persistent bibasilar atelectasis and/or airspace consolidation (right greater than left), with small right pleural effusion which is increased compared to the prior study from 2 days ago. Emphysematous changes are again noted in the lungs. No evidence of pulmonary edema. Heart size is within normal limits. Upper mediastinal contours are grossly distorted by patient positioning. Atherosclerosis of the thoracic aorta.  IMPRESSION: 1. Support apparatus, as above. 2. Bibasilar opacities may reflect atelectasis and/or airspace consolidation, generally similar to the prior study, however, there appears to be a the increasing small right pleural effusion. 3. Emphysema. 4. Atherosclerosis. Electronically Signed   By: Vinnie Langton M.D.   On: 05/29/2015 09:19     STUDIES:  CXR 4/27 > emphysematous changes.  CULTURES: Sputum 4/27 >>  BC x 2 4/27 >> Urine 4/27 >> negative  ANTIBIOTICS: Vanc 4/27 >> Aztreonam 4/27 >> Levofloxacin 4/28 >>  SIGNIFICANT EVENTS: 4/27- Admit, possible HCAP, ett placed  LINES/TUBES: ETT 4/27 >>  DISCUSSION: 60 y.o. M admitted with AECOPD and possible HCAP, failed BiPAP and required intubation.  ASSESSMENT / PLAN:  PULMONARY A: Acute on chronic hypoxic / hypercarbic respiratory failure - followed by Dr. Luan Pulling, on O2 at baseline AECOPD. HCAP. P:   Continue PCV for full support mode Abx per ID section Solumedrol 40 mg IV Q8 DuoNebs / Budesonide / Albuterol. Intermittent CXR Daily SBT / WUA  CARDIOVASCULAR A: Combined CHF - last echo from 04/13/13 with EF 40-45%, grade 1DD. Hx CAD, AFib. P:  Heparin gtt in lieu of outpatient eliquis. ECHO pending >> Continue outpatient plavix, atorvastatin Hold outpatient eliquis, bisoprolol, lasix, losartan  RENAL A: AKI - mild, baseline Scr ~0.8 P:   NS @ KVO Lasix 40 mg IV x1 + KCL Trend BMP / UOP   GASTROINTESTINAL A: GI prophylaxis Nutrition P:   SUP: pantoprazole TF per nutrition   HEMATOLOGIC A: Anemia - mild Chronic Anticoagulation secondary to AF - apixaban as outpatient VTE prophylaxis P:  Transfuse for hgb < 7 SCD's / heparin gtt  Trend CBC   INFECTIOUS A: CAP - last hospitalization in Jan 2017 PCN allergy P:   Abx as above (vanc / aztreonam / levaquin) Follow cultures PCT algorithm to limit abx exposure  ENDOCRINE A: Hyperglycemia - no hx DM. P:   SSI   NEUROLOGIC A: AMS - in  setting of hypercarbia, resolving P:   Sedation: Propofol gtt / fentanyl PRN RASS goal: 0 to -1 Daily WUA Hold outpatient gabapentin   FAMILY  Updates:   Daughter updated at bedside.    Inter-disciplinary family meet or Palliative Care meeting due by:  06/01/15.  Noe Gens, NP-C [facility] Pgr: [phone] or if no answer [phone] 05/29/2015, 11:24 AM    Attending note: I have seen and examined the patient with nurse practitioner/resident and agree with the note. History, labs and imaging reviewed.  60 Y/O with VDRF from HCAP, AECOPD.  Remains ventilated. Failing weaning trials Continue abx, steroids, nebs. Lasix for diuresis.  Critical care time- 35 mins. This represents my time independent of the NPs time taking care of the patient.  Marshell Garfinkel MD [facility] Pager [phone] If no answer or after 3pm call: [phone] 05/29/2015, 5:01 PM

## 2015-05-30 ENCOUNTER — Inpatient Hospital Stay (HOSPITAL_COMMUNITY): Payer: Medicare Other

## 2015-05-30 LAB — BASIC METABOLIC PANEL
Anion gap: 10 (ref 5–15)
BUN: 56 mg/dL — ABNORMAL HIGH (ref 6–20)
CHLORIDE: 98 mmol/L — AB (ref 101–111)
CO2: 36 mmol/L — ABNORMAL HIGH (ref 22–32)
Calcium: 8.6 mg/dL — ABNORMAL LOW (ref 8.9–10.3)
Creatinine, Ser: 0.92 mg/dL (ref 0.61–1.24)
Glucose, Bld: 211 mg/dL — ABNORMAL HIGH (ref 65–99)
POTASSIUM: 5.2 mmol/L — AB (ref 3.5–5.1)
SODIUM: 144 mmol/L (ref 135–145)

## 2015-05-30 LAB — CBC
HEMATOCRIT: 29.7 % — AB (ref 39.0–52.0)
HEMOGLOBIN: 8.8 g/dL — AB (ref 13.0–17.0)
MCH: 28.8 pg (ref 26.0–34.0)
MCHC: 29.6 g/dL — ABNORMAL LOW (ref 30.0–36.0)
MCV: 97.1 fL (ref 78.0–100.0)
Platelets: 392 10*3/uL (ref 150–400)
RBC: 3.06 MIL/uL — AB (ref 4.22–5.81)
RDW: 15.5 % (ref 11.5–15.5)
WBC: 22.8 10*3/uL — AB (ref 4.0–10.5)

## 2015-05-30 LAB — GLUCOSE, CAPILLARY
GLUCOSE-CAPILLARY: 193 mg/dL — AB (ref 65–99)
GLUCOSE-CAPILLARY: 199 mg/dL — AB (ref 65–99)
Glucose-Capillary: 167 mg/dL — ABNORMAL HIGH (ref 65–99)
Glucose-Capillary: 185 mg/dL — ABNORMAL HIGH (ref 65–99)
Glucose-Capillary: 182 mg/dL — ABNORMAL HIGH (ref 65–99)
Glucose-Capillary: 165 mg/dL — ABNORMAL HIGH (ref 65–99)

## 2015-05-30 LAB — PHOSPHORUS: PHOSPHORUS: 3.1 mg/dL (ref 2.5–4.6)

## 2015-05-30 LAB — APTT
APTT: 108 s — AB (ref 24–37)
APTT: 88 s — AB (ref 24–37)

## 2015-05-30 LAB — CULTURE, RESPIRATORY W GRAM STAIN: Special Requests: NORMAL

## 2015-05-30 LAB — HEPARIN LEVEL (UNFRACTIONATED)
HEPARIN UNFRACTIONATED: 1.2 [IU]/mL — AB (ref 0.30–0.70)
Heparin Unfractionated: 1.22 IU/mL — ABNORMAL HIGH (ref 0.30–0.70)

## 2015-05-30 LAB — MAGNESIUM: MAGNESIUM: 2.9 mg/dL — AB (ref 1.7–2.4)

## 2015-05-30 LAB — CULTURE, RESPIRATORY

## 2015-05-30 LAB — VANCOMYCIN, TROUGH: Vancomycin Tr: 24 ug/mL — ABNORMAL HIGH (ref 10.0–20.0)

## 2015-05-30 MED ORDER — VANCOMYCIN HCL IN DEXTROSE 1-5 GM/200ML-% IV SOLN
1000.0000 mg | Freq: Two times a day (BID) | INTRAVENOUS | Status: DC
  Administered 2015-05-30 – 2015-06-01 (×4): 1000 mg via INTRAVENOUS
  Filled 2015-05-30 (×7): qty 200

## 2015-05-30 MED ORDER — FUROSEMIDE 10 MG/ML IJ SOLN
40.0000 mg | Freq: Four times a day (QID) | INTRAMUSCULAR | Status: AC
  Administered 2015-05-30 (×3): 40 mg via INTRAVENOUS
  Filled 2015-05-30 (×3): qty 4

## 2015-05-30 MED ORDER — MIDAZOLAM HCL 2 MG/2ML IJ SOLN
1.0000 mg | INTRAMUSCULAR | Status: DC | PRN
  Administered 2015-05-30 – 2015-05-31 (×3): 2 mg via INTRAVENOUS
  Administered 2015-05-31: 1 mg via INTRAVENOUS
  Administered 2015-06-01: 2 mg via INTRAVENOUS
  Filled 2015-05-30 (×5): qty 2

## 2015-05-30 NOTE — Procedures (Signed)
Bronchoscopy Procedure Note Benjamin [name] [id number] 1955-02-06  Procedure: Bronchoscopy Indications: Obtain specimens for culture and/or other diagnostic studies  Procedure Details Consent: Risks of procedure as well as the alternatives and risks of each were explained to the (patient/caregiver).  Consent for procedure obtained. Time Out: Verified patient identification, verified procedure, site/side was marked, verified correct patient position, special equipment/implants available, medications/allergies/relevent history reviewed, required imaging and test results available.  Performed  In preparation for procedure, patient was given 100% FiO2 and bronchoscope lubricated. Sedation: Fentanyl and Propofol  Airway entered and the following bronchi were examined: RUL, RML, RLL, LUL, LLL and Bronchi.   Bronchoscope removed.    Evaluation Hemodynamic Status: BP stable throughout; O2 sats: stable throughout Patient's Current Condition: stable Specimens:  Sent purulent fluid Complications: No apparent complications Patient did tolerate procedure well.   Benjamin [name] 05/30/2015

## 2015-05-30 NOTE — Progress Notes (Signed)
Pharmacy Antibiotic Note  Benjamin [name] is a 60 y.o. male admitted on 05/26/2015 with pneumonia.  Pharmacy has been consulted for vancomycin/aztreonam dosing. Pt is also on Levaquin per MD. Pt's respiratory culture growing haemophilus influenza. VT today resulted at 24.   Plan: Decrease Vancomycin to 1000 mg IV every 12 hours Continue Aztreonam 2g IV Q 8 hrs Continue Levaquin 750 q24 F/u renal function, C&S, LOT, clinical course F/u BAL culture for potential de-escalation  Height: 5\' 11"  (180.3 cm) Weight: 222 lb 7.1 oz (100.9 kg) IBW/kg (Calculated) : 75.3  Temp (24hrs), Avg:98.7 F (37.1 C), Min:98.2 F (36.8 C), Max:99.3 F (37.4 C)   Recent Labs Lab 05/26/15 1410 05/26/15 1430 05/26/15 2200 05/27/15 0311 05/28/15 0325 05/29/15 0500 05/30/15 0212 05/30/15 1056  WBC 18.8*  --   --  11.8* 13.1* 13.4* 22.8*  --   CREATININE 1.15  --   --  1.31* 0.94 0.83 0.92  --   LATICACIDVEN  --  1.1 1.2  --   --   --   --   --   VANCOTROUGH  --   --   --   --   --   --   --  24*    Estimated Creatinine Clearance: 104.6 mL/min (by C-G formula based on Cr of 0.92).    Allergies  Allergen Reactions  . Codeine     Red blotches  . Penicillins     Childhood reaction    Antimicrobials this admission: Azithromycin 4/27 x1 Aztreonam 4/27 >>  Levaquin 4/28>>  Vancomycin 4/27>>  Dose adjustments this admission: Vancomycin 4/27 at 750 mg Q12H >>  Switched to 1000 mg IV every 8 hours 4/29 >>     VT 24 Switched to Vancomycin 1000 q12h 5/1 He received 1.5 g vancomycin at 1530 on 4/27  Microbiology results: MRSA PCR neg 4/27 Urine -ngtd 4/27 urine pm -ngtd 4/27 blood x 2 -ngtd 4/27 respiratory -Haemophilus influenza beta lactamase pos 5/1 pneumocystis smear >> 5/1 Legionella cx >> 5/1 BAL cx >>  Thank you for allowing pharmacy to be a part of this patient's care.  Angela Burke, PharmD Pharmacy Resident Pager: [phone] 05/30/2015 12:25 PM

## 2015-05-30 NOTE — Progress Notes (Signed)
ANTICOAGULATION CONSULT NOTE - Follow-up  Pharmacy Consult for heparin Indication: afib  Allergies  Allergen Reactions  . Codeine     Red blotches  . Penicillins     Childhood reaction   Patient Measurements: Height: 5\' 11"  (180.3 cm) Weight: 222 lb 7.1 oz (100.9 kg) IBW/kg (Calculated) : 75.3 Heparin dosing weight = 97 kg  Assessment: Pt was on Eliquis PTA for afib, now on IV heparin. Pt is currently intubated, per family, pt took AM meds prior to admission on 4/27, so likely had a dose of eliquis that AM. HL 1.22 and aPTT 88 after dose decrease to 1900 units/hr. CBC stable with no sxs of bleeding.   Goal of Therapy: Heparin level 0.3-0.7 units/ml aPTT 66-102 seconds Monitor platelets by anticoagulation protocol: Yes  Plan:  1. Continue heparin infusion at 1900 units/hr based on aPTT of 88 2. HL/aPTT in am  3. Daily aPTT/HL, CBC 4. Monitor for S&S of bleed  Vincenza Hews, PharmD, BCPS 05/30/2015, 8:36 PM Pager: [phone]

## 2015-05-30 NOTE — Progress Notes (Signed)
eLink Physician-Brief Progress Note Patient Name: Benjamin [name] DOB: 1955-09-09 MRN: [id number]   Date of Service  05/30/2015  HPI/Events of Note  Patient stacking breaths. Now changed to PSV = PS 10/P 5. Nurse says that he 'looks comfortable".  eICU Interventions  Will leave the patient on PSV and check another ABG at 8 PM.     Intervention Category Major Interventions: Acute renal failure - evaluation and management;Respiratory failure - evaluation and management  Sommer,Steven Eugene 05/30/2015, 5:05 PM

## 2015-05-30 NOTE — Progress Notes (Signed)
CRITICAL VALUE ALERT  Date: 05/30/15  Critical value received:  Potassium 5.2   Nurse who received alert:  Alben Deeds, RN  MD notified (1st page):  Elink notified at [facility]

## 2015-05-30 NOTE — Progress Notes (Addendum)
ANTICOAGULATION CONSULT NOTE - Follow-up  Pharmacy Consult for heparin Indication: afib  Allergies  Allergen Reactions  . Codeine     Red blotches  . Penicillins     Childhood reaction   Patient Measurements: Height: 5\' 11"  (180.3 cm) Weight: 222 lb 7.1 oz (100.9 kg) IBW/kg (Calculated) : 75.3 Heparin dosing weight = 97 kg  Assessment: Pt was on Eliquis PTA for afib, now on IV heparin. Pt is currently intubated, per family, pt took AM meds prior to admission on 4/27, so likely had a dose of eliquis that AM. aPTT  today at 108 and HL 1.2 on heparin 2100 units/hr.  Goal of Therapy: Heparin level 0.3-0.7 units/ml aPTT 66-102 seconds Monitor platelets by anticoagulation protocol: Yes  Plan:  Decrease heparin infusion to 1900 units/hr 6 hr HL/aPTT Daily aPTT/HL, CBC Monitor for S&S of bleed  Angela Burke, PharmD Pharmacy Resident Pager: [phone]  05/30/2015 9:31 AM

## 2015-05-30 NOTE — Progress Notes (Signed)
Patient was switched back to PCV because patient was tiring out on PSV/CPAP and had increased copious secretions. ABG was ordered to be drawn on PSV/CPAP. Since patient is back on PCV theres not need to collect ABG at 2000 at this time. RT will continue to monitor.

## 2015-05-30 NOTE — Progress Notes (Signed)
PULMONARY / CRITICAL CARE MEDICINE   Name: Benjamin [name] MRN: [id number] DOB: 11/05/1955    ADMISSION DATE:  05/26/2015  REFERRING MD:  Forestine Na   CHIEF COMPLAINT:  Respiratory failure   HISTORY OF PRESENT ILLNESS:   60 y/o male with hx COPD, CAD, AFib, combined CHF presented to [facility] ER 4/27 with 2 day hx SOB.  He initially sought care at his PCP's office who sent him urgently to ER with respiratory distress.  Pt with worsening respiratory acidosis and lethargy despite bipap attempts in ER.  He was intubated and tx to [facility] with AECOPD and probable HCAP.  Had recent admission 01/31/15 - 02/02/15 for AECOPD, discharged on azithro and prednisone taper.  SUBJECTIVE:   RN reports no acute events, remains on PCV. Fentanyl gtt started overnight for pain/sedation.  VITAL SIGNS: BP 126/71 mmHg  Pulse 92  Temp(Src) 99.3 F (37.4 C) (Axillary)  Resp 19  Ht 5\' 11"  (1.803 m)  Wt 100.9 kg (222 lb 7.1 oz)  BMI 31.04 kg/m2  SpO2 95%  HEMODYNAMICS:    VENTILATOR SETTINGS: Vent Mode:  [-] PCV FiO2 (%):  [40 %-50 %] 50 % Set Rate:  [14 bmp] 14 bmp PEEP:  [5 cmH20] 5 cmH20 Pressure Support:  [10 cmH20] 10 cmH20 Plateau Pressure:  [17 cmH20-24 cmH20] 21 cmH20  INTAKE / OUTPUT: I/O last 3 completed shifts: In: 4938.8 [I.V.:2278.8; NG/GT:1260; IV Piggyback:1400] Out: [id number] [Urine:4555]  PHYSICAL EXAMINATION: General: obese male in NAD, lying in bed Neuro: Sedate but arouses to voice, appears comfortable, following commands HEENT: [place]/AT. PERRL, sclerae anicteric. Cardiovascular: RRR, no M/R/G.  Lungs: even/non-labored, lungs bilaterally coarse Abdomen: BSx4 active, large abd  Musculoskeletal: No gross deformities, no edema.  Skin: Intact, warm, no rashes.    LABS:  BMET  Recent Labs Lab 05/28/15 0325 05/29/15 0500 05/30/15 0212  NA 141 142 144  K 4.4 4.4 5.2*  CL 99* 100* 98*  CO2 32 31 36*  BUN 33* 43* 56*  CREATININE 0.94 0.83 0.92  GLUCOSE 202* 272* 211*     Electrolytes  Recent Labs Lab 05/28/15 0325 05/29/15 0500 05/30/15 0212  CALCIUM 8.7* 8.6* 8.6*  MG 2.9* 2.9* 2.9*  PHOS 2.2* 2.7 3.1    CBC  Recent Labs Lab 05/28/15 0325 05/29/15 0500 05/30/15 0212  WBC 13.1* 13.4* 22.8*  HGB 9.2* 9.0* 8.8*  HCT 31.8* 30.6* 29.7*  PLT 354 353 392    Coag's  Recent Labs Lab 05/29/15 0936 05/29/15 1937 05/30/15 0440  APTT 92* 101* 108*    Sepsis Markers  Recent Labs Lab 05/26/15 1430 05/26/15 2109 05/26/15 2200 05/27/15 0311 05/28/15 0325  LATICACIDVEN 1.1  --  1.2  --   --   PROCALCITON  --  3.02  --  2.92 1.73    ABG  Recent Labs Lab 05/26/15 1720 05/27/15 0348 05/28/15 1543  PHART 7.256* 7.306* 7.342*  PCO2ART 67.3* 68.6* 66.6*  PO2ART 101* 180.0* 77.0*    Liver Enzymes No results for input(s): AST, ALT, ALKPHOS, BILITOT, ALBUMIN in the last 168 hours.  Cardiac Enzymes  Recent Labs Lab 05/26/15 2200 05/27/15 0311 05/27/15 1304  TROPONINI <0.03 <0.03 0.06*    Glucose  Recent Labs Lab 05/29/15 1127 05/29/15 1543 05/29/15 2011 05/29/15 2343 05/30/15 0342 05/30/15 0737  GLUCAP 270* 228* 183* 199* 167* 185*    Imaging Dg Chest Port 1 View  05/30/2015  CLINICAL DATA:  Respiratory failure. EXAM: PORTABLE CHEST 1 VIEW COMPARISON:  05/29/2015 . FINDINGS: Endotracheal  tube and NG tube in stable position. Stable cardiomegaly. Low lung volumes with bibasilar atelectasis and/or infiltrates. COPD. No significant pleural effusion. No pneumothorax. IMPRESSION: 1.  Lines and tubes in stable position. 2. Low lung volumes with mild bibasilar atelectasis and/or infiltrates again noted. No interim change. COPD. 3.  Stable cardiomegaly . Electronically Signed   By: Marcello Moores  Register   On: 05/30/2015 07:25     STUDIES:  CXR 4/27 > emphysematous changes.  CULTURES: Sputum 4/27 >> NTD BC x 2 4/27 >>NTD Urine 4/27 >>NTD BAL 5/1>>>  ANTIBIOTICS: Vanc 4/27>>> Aztreonam 4/27>>> Levofloxacin  4/28>>>  SIGNIFICANT EVENTS: 4/27- Admit, possible HCAP, ett placed  LINES/TUBES: ETT 4/27 >>  DISCUSSION: 60 y.o. M admitted with AECOPD and possible HCAP, failed BiPAP and required intubation.  ASSESSMENT / PLAN:  PULMONARY A: Acute on chronic hypoxic / hypercarbic respiratory failure - followed by Dr. Luan Pulling, on O2 at baseline AECOPD. HCAP. P:   Continue PCV for full support mode Bronch today given mucous plugging and atelectasis. Abx per ID section Solumedrol 40 mg IV Q8 DuoNebs / Budesonide / Albuterol. Intermittent CXR Daily SBT / WUA  CARDIOVASCULAR A: Combined CHF - last echo from 04/13/13 with EF 40-45%, grade 1DD. Hx CAD, AFib. P:  Heparin gtt in lieu of outpatient eliquis. ECHO>>>EF 55-60% and no tamponade. Continue outpatient plavix, atorvastatin Hold outpatient eliquis, bisoprolol, lasix, losartan Hold heparin, may restart post bronch.  RENAL A: AKI - mild, baseline Scr ~0.8 P:   NS @ KVO Lasix 40 mg IV q6 x3 doses. Trend BMP / UOP  Replace electrolytes as indicated.  GASTROINTESTINAL A: GI prophylaxis Nutrition P:   SUP: pantoprazole TF per nutrition   HEMATOLOGIC A: Anemia - mild Chronic Anticoagulation secondary to AF - apixaban as outpatient VTE prophylaxis P:  Transfuse for hgb < 7 SCD's/heparin gtt Trend CBC  INFECTIOUS A: CAP - last hospitalization in Jan 2017 PCN allergy P:   Abx as above (vanc/aztreonam/levaquin) Follow cultures PCT algorithm to limit abx exposure  ENDOCRINE A: Hyperglycemia - no hx DM. P:   SSI   NEUROLOGIC A: AMS - in setting of hypercarbia, resolving P:   Sedation: Propofol gtt / fentanyl PRN RASS goal: 0 to -1 Daily WUA Hold outpatient gabapentin Post bronch will change propofol to versed.  FAMILY  Updates:  No family bedside.  Inter-disciplinary family meet or Palliative Care meeting due by:  06/01/15.  The patient is critically ill with multiple organ systems failure and requires  high complexity decision making for assessment and support, frequent evaluation and titration of therapies, application of advanced monitoring technologies and extensive interpretation of multiple databases.   Critical Care Time devoted to patient care services described in this note is  35  Minutes. This time reflects time of care of this signee Dr Jennet Maduro. This critical care time does not reflect procedure time, or teaching time or supervisory time of PA/NP/Med student/Med Resident etc but could involve care discussion time.  Rush Farmer, M.D. [facility] Pulmonary/Critical Care Medicine. Pager: [phone]. After hours pager: [phone].

## 2015-05-30 NOTE — Progress Notes (Signed)
Patient breath stacking on ventilator so RT flipped patient to CPAP 5/ PS 10 for comfort. Patient pulling almost 1 L. Will continue on this mode as patient tolerates. MD aware.

## 2015-05-30 NOTE — Progress Notes (Signed)
eLink Physician-Brief Progress Note Patient Name: Benjamin [name] DOB: 1955-11-23 MRN: [id number]   Date of Service  05/30/2015  HPI/Events of Note  Notified of mild hyperkalemia. Currently 5.2.  eICU Interventions  Continue monitoring on telemetry.     Intervention Category Intermediate Interventions: Electrolyte abnormality - evaluation and management  Tera Partridge 05/30/2015, 6:25 AM

## 2015-05-30 NOTE — Progress Notes (Signed)
Inpatient Diabetes Program Recommendations  AACE/ADA: New Consensus Statement on Inpatient Glycemic Control (2015)  Target Ranges:  Prepandial:   less than 140 mg/dL      Peak postprandial:   less than 180 mg/dL (1-2 hours)      Critically ill patients:  140 - 180 mg/dL  Results for Benjamin [name], Benjamin [name] (MRN [id number]) as of 05/30/2015 10:21  Ref. Range 05/29/2015 04:31 05/29/2015 07:58 05/29/2015 11:27 05/29/2015 15:43 05/29/2015 20:11 05/29/2015 23:43 05/30/2015 03:42 05/30/2015 07:37  Glucose-Capillary Latest Ref Range: 65-99 mg/dL 249 (H) 246 (H) 270 (H) 228 (H) 183 (H) 199 (H) 167 (H) 185 (H)   Review of Glycemic Control  Current orders for Inpatient glycemic control: Novolog 0-15 units Q4H  Inpatient Diabetes Program Recommendations: Insulin - Basal: If steroids are continued as ordered, please consider ordering Lantus 10 untis Q24H (based on 100 kg x 0.1 units). Please note that if Lantus is ordered as recommended it will need to be titrated as steroids are tapered.  Thanks, Barnie Alderman, RN, MSN, CDE Diabetes Coordinator Inpatient Diabetes Program [phone] (Team Pager from [place] to [place]) [phone] ([facility] office) [phone] [facility] office) [phone] [facility] office)

## 2015-05-31 ENCOUNTER — Inpatient Hospital Stay (HOSPITAL_COMMUNITY): Payer: Medicare Other

## 2015-05-31 LAB — GLUCOSE, CAPILLARY
GLUCOSE-CAPILLARY: 201 mg/dL — AB (ref 65–99)
GLUCOSE-CAPILLARY: 137 mg/dL — AB (ref 65–99)
Glucose-Capillary: 192 mg/dL — ABNORMAL HIGH (ref 65–99)
Glucose-Capillary: 207 mg/dL — ABNORMAL HIGH (ref 65–99)
Glucose-Capillary: 175 mg/dL — ABNORMAL HIGH (ref 65–99)
Glucose-Capillary: 143 mg/dL — ABNORMAL HIGH (ref 65–99)

## 2015-05-31 LAB — BLOOD GAS, ARTERIAL
ACID-BASE EXCESS: 16.5 mmol/L — AB (ref 0.0–2.0)
Bicarbonate: 42.5 mEq/L — ABNORMAL HIGH (ref 20.0–24.0)
Drawn by: 244861
FIO2: 0.5
LHR: 14 {breaths}/min
O2 Saturation: 97.1 %
PEEP: 5 cmH2O
PH ART: 7.374 (ref 7.350–7.450)
Patient temperature: 98.6
TCO2: 44.8 mmol/L (ref 0–100)
pCO2 arterial: 74.6 mmHg (ref 35.0–45.0)
pO2, Arterial: 97.6 mmHg (ref 80.0–100.0)

## 2015-05-31 LAB — PNEUMOCYSTIS JIROVECI SMEAR BY DFA: PNEUMOCYSTIS JIROVECI AG: NEGATIVE

## 2015-05-31 LAB — CULTURE, BLOOD (ROUTINE X 2)
Culture: NO GROWTH
Culture: NO GROWTH

## 2015-05-31 LAB — BASIC METABOLIC PANEL
Anion gap: 13 (ref 5–15)
BUN: 65 mg/dL — AB (ref 6–20)
CO2: 38 mmol/L — ABNORMAL HIGH (ref 22–32)
CREATININE: 0.89 mg/dL (ref 0.61–1.24)
Calcium: 9.3 mg/dL (ref 8.9–10.3)
Chloride: 96 mmol/L — ABNORMAL LOW (ref 101–111)
GFR calc Af Amer: >60 mL/min (ref >60)
GLUCOSE: 205 mg/dL — AB (ref 65–99)
POTASSIUM: 4.7 mmol/L (ref 3.5–5.1)
Sodium: 147 mmol/L — ABNORMAL HIGH (ref 135–145)

## 2015-05-31 LAB — CBC
HCT: 33.9 % — ABNORMAL LOW (ref 39.0–52.0)
HEMOGLOBIN: 9.8 g/dL — AB (ref 13.0–17.0)
MCH: 27.8 pg (ref 26.0–34.0)
MCHC: 28.9 g/dL — AB (ref 30.0–36.0)
MCV: 96 fL (ref 78.0–100.0)
PLATELETS: 410 10*3/uL — AB (ref 150–400)
RBC: 3.53 MIL/uL — ABNORMAL LOW (ref 4.22–5.81)
RDW: 15.7 % — AB (ref 11.5–15.5)
WBC: 25.6 10*3/uL — ABNORMAL HIGH (ref 4.0–10.5)

## 2015-05-31 LAB — HEPARIN LEVEL (UNFRACTIONATED)
HEPARIN UNFRACTIONATED: 1.22 [IU]/mL — AB (ref 0.30–0.70)
Heparin Unfractionated: 1.07 IU/mL — ABNORMAL HIGH (ref 0.30–0.70)
Heparin Unfractionated: 0.74 IU/mL — ABNORMAL HIGH (ref 0.30–0.70)

## 2015-05-31 LAB — PHOSPHORUS: PHOSPHORUS: 3.4 mg/dL (ref 2.5–4.6)

## 2015-05-31 LAB — MAGNESIUM: MAGNESIUM: 2.9 mg/dL — AB (ref 1.7–2.4)

## 2015-05-31 LAB — APTT: aPTT: 76 seconds — ABNORMAL HIGH (ref 24–37)

## 2015-05-31 MED ORDER — ALBUTEROL (5 MG/ML) CONTINUOUS INHALATION SOLN
10.0000 mg/h | INHALATION_SOLUTION | RESPIRATORY_TRACT | Status: DC
  Filled 2015-05-31: qty 20

## 2015-05-31 MED ORDER — ALBUTEROL SULFATE (2.5 MG/3ML) 0.083% IN NEBU
INHALATION_SOLUTION | RESPIRATORY_TRACT | Status: AC
  Administered 2015-05-31: 10 mg
  Filled 2015-05-31: qty 12

## 2015-05-31 MED ORDER — ALBUTEROL SULFATE (2.5 MG/3ML) 0.083% IN NEBU: 2.5000 mg | INHALATION_SOLUTION | RESPIRATORY_TRACT | Status: DC | PRN

## 2015-05-31 MED ORDER — IPRATROPIUM-ALBUTEROL 0.5-2.5 (3) MG/3ML IN SOLN: 3.0000 mL | RESPIRATORY_TRACT | Status: DC

## 2015-05-31 NOTE — Progress Notes (Signed)
ANTICOAGULATION CONSULT NOTE - Follow Up Consult  Pharmacy Consult for heparin Indication: atrial fibrillation  Allergies  Allergen Reactions  . Codeine     Red blotches  . Penicillins     Childhood reaction    Patient Measurements: Height: 5\' 11"  (180.3 cm) Weight: 219 lb 5.7 oz (99.5 kg) IBW/kg (Calculated) : 75.3  Vital Signs: Temp: 98.4 F (36.9 C) (05/02 0800) Temp Source: Oral (05/02 0800) BP: 135/88 mmHg (05/02 0900) Pulse Rate: 83 (05/02 1045)  Labs:  Recent Labs  05/29/15 0500  05/30/15 0212 05/30/15 0440 05/30/15 1846 05/31/15 0217 05/31/15 0219 05/31/15 1202  HGB 9.0*  --  8.8*  --   --  9.8*  --   --   HCT 30.6*  --  29.7*  --   --  33.9*  --   --   PLT 353  --  392  --   --  410*  --   --   APTT  --   < >  --  108* 88*  --  76*  --   HEPARINUNFRC  --   < > 1.20*  --  1.22* 1.22*  --  1.07*  CREATININE 0.83  --  0.92  --   --  0.89  --   --   < > = values in this interval not displayed.  Estimated Creatinine Clearance: 107.4 mL/min (by C-G formula based on Cr of 0.89).  Assessment:  60 y/o M with hx of afib. Eliquis currently on hold and pharmacy has been consulted for heparin gtt. Previously dosed based on aPTT due to Eliquis influence. Last dose Eliquis noted on 4/27. Now dosing with HL. HL 1.07, CBC stable.   Goal of Therapy:  Heparin level 0.3-0.7 units/ml Monitor platelets by anticoagulation protocol: Yes   Plan:  Decrease heparin gtt to 1400 units/hr 6 hr HL Daily HL, CBC Monitor for S&S of bleed  Angela Burke, PharmD Pharmacy Resident Pager: [phone] 05/31/2015,12:43 PM

## 2015-05-31 NOTE — Progress Notes (Signed)
Inpatient Diabetes Program Recommendations  AACE/ADA: New Consensus Statement on Inpatient Glycemic Control (2015)  Target Ranges:  Prepandial:   less than 140 mg/dL      Peak postprandial:   less than 180 mg/dL (1-2 hours)      Critically ill patients:  140 - 180 mg/dL  Results for CHATHAM, BREDEHOFT (MRN [id number]) as of 05/31/2015 08:54  Ref. Range 05/30/2015 07:37 05/30/2015 11:03 05/30/2015 11:26 05/30/2015 16:04 05/30/2015 20:05 05/30/2015 23:15 05/31/2015 04:08 05/31/2015 08:04  Glucose-Capillary Latest Ref Range: 65-99 mg/dL 185 (H) 182 (H) 193 (H) 165 (H) 192 (H) 207 (H) 201 (H) 175 (H)   Review of Glycemic Control  Current orders for Inpatient glycemic control: Novolog 0-15 units Q4H  Inpatient Diabetes Program Recommendations: Insulin - Basal: Over the past 24 hours glucose has ranged from 165-207 mg/dl and patient has received a total of Novolog 25 units for correction. If steroids are continued as ordered, please consider ordering Lantus 10 untis Q24H (based on 100 kg x 0.1 units). Please note that if Lantus is ordered as recommended it will need to be titrated as steroids are tapered.  Thanks, Barnie Alderman, RN, MSN, CDE Diabetes Coordinator Inpatient Diabetes Program [phone] (Team Pager from [place] to [place]) [phone] ([facility] office) [phone] [facility] office) [phone] [facility] office)

## 2015-05-31 NOTE — Consult Note (Signed)
   [facility] CM Inpatient Consult   05/31/2015  Benjamin [name] 19-Feb-1955 [id number] Patient screened for potential [facility] Management services. Patient is eligible for [facility] through his [facility].  Admitted 05/26/15 from [facility] with Respiratory Distress/Failure. Electronic medical record reveals patient to be recently extubated and currently receiving treatment.Will follow for [facility] Care Management needs at a more appropriate time with inpatient RNCM.   For questions or consults, please contact:  Natividad Brood, RN BSN [facility] Liaison  [phone] business mobile phone Toll free office [phone]

## 2015-05-31 NOTE — Progress Notes (Signed)
PULMONARY / CRITICAL CARE MEDICINE   Name: Benjamin [name] MRN: [id number] DOB: [birth year]    ADMISSION DATE:  05/26/2015  REFERRING MD:  Forestine Na   CHIEF COMPLAINT:  Respiratory failure   HISTORY OF PRESENT ILLNESS:   60 y/o male with hx COPD, CAD, AFib, combined CHF presented to [facility] ER 4/27 with 2 day hx SOB.  He initially sought care at his PCP's office who sent him urgently to ER with respiratory distress.  Pt with worsening respiratory acidosis and lethargy despite bipap attempts in ER.  He was intubated and tx to [facility] with AECOPD and probable HCAP.  Had recent admission 01/31/15 - 02/02/15 for AECOPD, discharged on azithro and prednisone taper.  SUBJECTIVE:   No acute events overnight, alert and interactive  VITAL SIGNS: BP 135/88 mmHg  Pulse 90  Temp(Src) 98.4 F (36.9 C) (Oral)  Resp 12  Ht 5\' 11"  (1.803 m)  Wt 99.5 kg (219 lb 5.7 oz)  BMI 30.61 kg/m2  SpO2 92%  HEMODYNAMICS:    VENTILATOR SETTINGS: Vent Mode:  [-] PSV;CPAP FiO2 (%):  [40 %-50 %] 40 % Set Rate:  [14 bmp] 14 bmp PEEP:  [5 cmH20] 5 cmH20 Pressure Support:  [5 cmH20-10 cmH20] 5 cmH20 Plateau Pressure:  [17 cmH20-26 cmH20] 19 cmH20  INTAKE / OUTPUT: I/O last 3 completed shifts: In: 4075.1 [I.V.:1885.1; NG/GT:1290; IV Piggyback:900] Out: 6700 [Urine:6700]  PHYSICAL EXAMINATION: General: obese male in NAD, lying in bed, awake and interactive. Neuro: Alert and interactive, moving all ext to command. HEENT: [place]/AT. PERRL, sclerae anicteric. Cardiovascular: RRR, no M/R/G.  Lungs: End exp wheezes noted. Abdomen: BSx4 active, large abd  Musculoskeletal: No gross deformities, no edema.  Skin: Intact, warm, no rashes.    LABS:  BMET  Recent Labs Lab 05/29/15 0500 05/30/15 0212 05/31/15 0217  NA 142 144 147*  K 4.4 5.2* 4.7  CL 100* 98* 96*  CO2 31 36* 38*  BUN 43* 56* 65*  CREATININE 0.83 0.92 0.89  GLUCOSE 272* 211* 205*    Electrolytes  Recent Labs Lab 05/29/15 0500  05/30/15 0212 05/31/15 0217  CALCIUM 8.6* 8.6* 9.3  MG 2.9* 2.9* 2.9*  PHOS 2.7 3.1 3.4    CBC  Recent Labs Lab 05/29/15 0500 05/30/15 0212 05/31/15 0217  WBC 13.4* 22.8* 25.6*  HGB 9.0* 8.8* 9.8*  HCT 30.6* 29.7* 33.9*  PLT 353 392 410*    Coag's  Recent Labs Lab 05/30/15 0440 05/30/15 1846 05/31/15 0219  APTT 108* 88* 76*    Sepsis Markers  Recent Labs Lab 05/26/15 1430 05/26/15 2109 05/26/15 2200 05/27/15 0311 05/28/15 0325  LATICACIDVEN 1.1  --  1.2  --   --   PROCALCITON  --  3.02  --  2.92 1.73    ABG  Recent Labs Lab 05/27/15 0348 05/28/15 1543 05/31/15 0324  PHART 7.306* 7.342* 7.374  PCO2ART 68.6* 66.6* 74.6*  PO2ART 180.0* 77.0* 97.6    Liver Enzymes No results for input(s): AST, ALT, ALKPHOS, BILITOT, ALBUMIN in the last 168 hours.  Cardiac Enzymes  Recent Labs Lab 05/26/15 2200 05/27/15 0311 05/27/15 1304  TROPONINI <0.03 <0.03 0.06*    Glucose  Recent Labs Lab 05/30/15 1126 05/30/15 1604 05/30/15 2005 05/30/15 2315 05/31/15 0408 05/31/15 0804  GLUCAP 193* 165* 192* 207* 201* 175*    Imaging Dg Chest Port 1 View  05/31/2015  CLINICAL DATA:  Shortness of breath. EXAM: PORTABLE CHEST 1 VIEW COMPARISON:  05/30/2015. FINDINGS: Endotracheal tube, NG tube in  stable position. Heart size stable. Low lung volumes with bibasilar atelectasis and/or infiltrates. COPD. Slight progression from prior exam. No pneumothorax. IMPRESSION: 1. Lines and tubes in stable position. 2. Low lung volumes with persistent bibasilar atelectasis and/or infiltrates. Slight progression from prior exam. COPD . Electronically Signed   By: Marcello Moores  Register   On: 05/31/2015 07:13     STUDIES:  CXR 4/27 > emphysematous changes.  CULTURES: Sputum 4/27 >> NTD BC x 2 4/27 >>NTD Urine 4/27 >>NTD BAL 5/1>>>  ANTIBIOTICS: Vanc 4/27>>> Aztreonam 4/27>>> Levofloxacin 4/28>>>  SIGNIFICANT EVENTS: 4/27- Admit, possible HCAP, ett  placed  LINES/TUBES: ETT 4/27>>5/2  DISCUSSION: 60 y.o. M admitted with AECOPD and possible HCAP, failed BiPAP and required intubation.  ASSESSMENT / PLAN:  PULMONARY A: Acute on chronic hypoxic / hypercarbic respiratory failure - followed by Dr. Luan Pulling, on O2 at baseline AECOPD. HCAP. P:   Extubate today. Ok to reintubate if needs be. Abx per ID section Solumedrol 40 mg IV Q8 DuoNebs / Budesonide / Albuterol. Intermittent CXR Daily SBT / WUA  CARDIOVASCULAR A: Combined CHF - last echo from 04/13/13 with EF 40-45%, grade 1DD. Hx CAD, AFib. P:  Heparin gtt in lieu of outpatient eliquis. ECHO>>>EF 55-60% and no tamponade. Continue outpatient plavix, atorvastatin Hold outpatient eliquis, bisoprolol, lasix, losartan Hold heparin, may restart post bronch.  RENAL A: AKI - mild, baseline Scr ~0.8 P:   NS @ KVO D/C lasix. Trend BMP / UOP  Replace electrolytes as indicated.  GASTROINTESTINAL A: GI prophylaxis Nutrition P:   SUP: pantoprazole TF per nutrition   HEMATOLOGIC A: Anemia - mild Chronic Anticoagulation secondary to AF - apixaban as outpatient VTE prophylaxis P:  Transfuse for hgb < 7 SCD's/heparin gtt Trend CBC  INFECTIOUS A: CAP - last hospitalization in Jan 2017 PCN allergy P:   Abx as above (vanc/aztreonam/levaquin), if cultures are negative in AM will d/c vanc/aztreonam and continue levaquin for a total of 8 days. Follow cultures.  ENDOCRINE A: Hyperglycemia - no hx DM. P:   SSI   NEUROLOGIC A: AMS - in setting of hypercarbia, resolving P:   D/C sedation. RASS goal: 0 to -1. Daily WUA. Hold outpatient gabapentin.  FAMILY  Updates:  Spoke with family and patient, ok with reintubation if needs be but no trach/peg/SNF or chronic vent.  Inter-disciplinary family meet or Palliative Care meeting due by:  06/01/15.  The patient is critically ill with multiple organ systems failure and requires high complexity decision making for  assessment and support, frequent evaluation and titration of therapies, application of advanced monitoring technologies and extensive interpretation of multiple databases.   Critical Care Time devoted to patient care services described in this note is  35  Minutes. This time reflects time of care of this signee Dr Jennet Maduro. This critical care time does not reflect procedure time, or teaching time or supervisory time of PA/NP/Med student/Med Resident etc but could involve care discussion time.  Rush Farmer, M.D. [facility] Pulmonary/Critical Care Medicine. Pager: [phone]. After hours pager: [phone].

## 2015-05-31 NOTE — Procedures (Signed)
Extubation Procedure Note  Patient Details:   Name: Benjamin [name] DOB: 02-May-1955 MRN: [id number]   Airway Documentation:     Evaluation  O2 sats: stable throughout Complications: No apparent complications Patient did tolerate procedure well. Bilateral Breath Sounds: Expiratory wheezes   Yes  Patient tolerated wean. MD ordered to extubate. Positive for cuff leak. Patient extubated to a 6 Lpm nasal cannula. No signs of dyspnea or stridor. Expiratory wheeze and prolonged expiratory phase noted. MD and RN aware. Albuterol treatment given. Will continue to monitor.  Myrtie Neither 05/31/2015, 11:28 AM

## 2015-05-31 NOTE — Progress Notes (Signed)
ANTICOAGULATION CONSULT NOTE   Pharmacy Consult for heparin Indication: atrial fibrillation   Patient Measurements: Height: 5\' 11"  (180.3 cm) Weight: 219 lb 5.7 oz (99.5 kg) IBW/kg (Calculated) : 75.3  Labs:  Recent Labs  05/29/15 0500  05/30/15 0212 05/30/15 0440 05/30/15 1846 05/31/15 0217 05/31/15 0219 05/31/15 1202 05/31/15 1925  HGB 9.0*  --  8.8*  --   --  9.8*  --   --   --   HCT 30.6*  --  29.7*  --   --  33.9*  --   --   --   PLT 353  --  392  --   --  410*  --   --   --   APTT  --   < >  --  108* 88*  --  76*  --   --   HEPARINUNFRC  --   < > 1.20*  --  1.22* 1.22*  --  1.07* 0.74*  CREATININE 0.83  --  0.92  --   --  0.89  --   --   --   < > = values in this interval not displayed.  Estimated Creatinine Clearance: 107.4 mL/min (by C-G formula based on Cr of 0.89).  Assessment: 60 y/o M with hx of afib. Eliquis currently on hold and pharmacy has been consulted for heparin gtt. Dosing based on heparin level. Most recent heparin level of 0.74 is slightly above desired range of 0.3-0.7 with heparin infusion rate of 1400 units/hr. No issues with infusion or sxs of bleeding.   Goal of Therapy:  Heparin level 0.3-0.7 units/ml Monitor platelets by anticoagulation protocol: Yes   Plan:  1. Slightly decrease heparin gtt to 1300 units/hr 2. HL in am  3. Daily HL, CBC 4. Monitor for S&S of bleed  Vincenza Hews, PharmD, BCPS 05/31/2015, 8:43 PM Pager: [phone]

## 2015-05-31 NOTE — Progress Notes (Signed)
[facility] for Heparin Indication: atrial fibrillation  Allergies  Allergen Reactions  . Codeine     Red blotches  . Penicillins     Childhood reaction    Patient Measurements: Height: 5\' 11"  (180.3 cm) Weight: 222 lb 7.1 oz (100.9 kg) IBW/kg (Calculated) : 75.3  Vital Signs: Temp: 98.5 F (36.9 C) (05/02 0000) Temp Source: Oral (05/02 0000) BP: 127/83 mmHg (05/02 0400) Pulse Rate: 84 (05/02 0400)  Labs:  Recent Labs  05/29/15 0500  05/30/15 0212 05/30/15 0440 05/30/15 1846 05/31/15 0217 05/31/15 0219  HGB 9.0*  --  8.8*  --   --  9.8*  --   HCT 30.6*  --  29.7*  --   --  33.9*  --   PLT 353  --  392  --   --  410*  --   APTT  --   < >  --  108* 88*  --  76*  HEPARINUNFRC  --   < > 1.20*  --  1.22* 1.22*  --   CREATININE 0.83  --  0.92  --   --  0.89  --   < > = values in this interval not displayed.  Estimated Creatinine Clearance: 108.1 mL/min (by C-G formula based on Cr of 0.89).   Assessment: 60 y.o. male with h/o Afib, Eliquis on hold, for heparin   Goal of Therapy:  Heparin level 0.3-0.7 Monitor platelets by anticoagulation protocol: Yes   Plan:  Decrease Heparin 1600 units/hr Check heparin level in 8 hours.   Caryl Pina 05/31/2015,4:26 AM

## 2015-06-01 DIAGNOSIS — J441 Chronic obstructive pulmonary disease with (acute) exacerbation: Secondary | ICD-10-CM

## 2015-06-01 LAB — BASIC METABOLIC PANEL
Anion gap: 10 (ref 5–15)
BUN: 46 mg/dL — ABNORMAL HIGH (ref 6–20)
CHLORIDE: 97 mmol/L — AB (ref 101–111)
CO2: 35 mmol/L — AB (ref 22–32)
CREATININE: 0.73 mg/dL (ref 0.61–1.24)
Calcium: 9 mg/dL (ref 8.9–10.3)
GFR calc non Af Amer: >60 mL/min (ref >60)
Glucose, Bld: 121 mg/dL — ABNORMAL HIGH (ref 65–99)
POTASSIUM: 4.7 mmol/L (ref 3.5–5.1)
Sodium: 142 mmol/L (ref 135–145)

## 2015-06-01 LAB — CBC
HCT: 35 % — ABNORMAL LOW (ref 39.0–52.0)
Hemoglobin: 10.3 g/dL — ABNORMAL LOW (ref 13.0–17.0)
MCH: 28.3 pg (ref 26.0–34.0)
MCHC: 29.4 g/dL — ABNORMAL LOW (ref 30.0–36.0)
MCV: 96.2 fL (ref 78.0–100.0)
PLATELETS: 383 10*3/uL (ref 150–400)
RBC: 3.64 MIL/uL — AB (ref 4.22–5.81)
RDW: 15.9 % — AB (ref 11.5–15.5)
WBC: 28.3 10*3/uL — ABNORMAL HIGH (ref 4.0–10.5)

## 2015-06-01 LAB — GLUCOSE, CAPILLARY
GLUCOSE-CAPILLARY: 140 mg/dL — AB (ref 65–99)
GLUCOSE-CAPILLARY: 132 mg/dL — AB (ref 65–99)
GLUCOSE-CAPILLARY: 148 mg/dL — AB (ref 65–99)
GLUCOSE-CAPILLARY: 75 mg/dL (ref 65–99)
Glucose-Capillary: 117 mg/dL — ABNORMAL HIGH (ref 65–99)

## 2015-06-01 LAB — MAGNESIUM: MAGNESIUM: 2.6 mg/dL — AB (ref 1.7–2.4)

## 2015-06-01 LAB — TRIGLYCERIDES: TRIGLYCERIDES: 228 mg/dL — AB (ref <150)

## 2015-06-01 LAB — HEPARIN LEVEL (UNFRACTIONATED): Heparin Unfractionated: 0.46 IU/mL (ref 0.30–0.70)

## 2015-06-01 LAB — APTT: aPTT: 45 seconds — ABNORMAL HIGH (ref 24–37)

## 2015-06-01 LAB — PHOSPHORUS: PHOSPHORUS: 4.3 mg/dL (ref 2.5–4.6)

## 2015-06-01 MED ORDER — ADULT MULTIVITAMIN W/MINERALS CH
1.0000 | ORAL_TABLET | Freq: Every day | ORAL | Status: DC
  Administered 2015-06-01 – 2015-06-05 (×5): 1
  Filled 2015-06-01 (×4): qty 1

## 2015-06-01 MED ORDER — LORAZEPAM 2 MG/ML IJ SOLN
INTRAMUSCULAR | Status: AC
  Administered 2015-06-01: 0.5 mg via INTRAVENOUS
  Filled 2015-06-01: qty 1

## 2015-06-01 MED ORDER — PANTOPRAZOLE SODIUM 40 MG PO TBEC
40.0000 mg | DELAYED_RELEASE_TABLET | Freq: Every day | ORAL | Status: DC
  Administered 2015-06-01 – 2015-06-05 (×5): 40 mg via ORAL
  Filled 2015-06-01 (×5): qty 1

## 2015-06-01 MED ORDER — SALINE SPRAY 0.65 % NA SOLN
1.0000 | NASAL | Status: DC | PRN
  Administered 2015-06-01: 1 via NASAL
  Filled 2015-06-01: qty 44

## 2015-06-01 MED ORDER — ALPRAZOLAM 0.25 MG PO TABS
0.2500 mg | ORAL_TABLET | Freq: Four times a day (QID) | ORAL | Status: DC | PRN
  Administered 2015-06-01: 0.25 mg via ORAL
  Filled 2015-06-01: qty 1

## 2015-06-01 MED ORDER — APIXABAN 5 MG PO TABS
5.0000 mg | ORAL_TABLET | Freq: Two times a day (BID) | ORAL | Status: DC
  Administered 2015-06-01 – 2015-06-06 (×11): 5 mg via ORAL
  Filled 2015-06-01 (×11): qty 1

## 2015-06-01 MED ORDER — CANGRELOR BOLUS VIA INFUSION
30.0000 ug/kg | Freq: Once | INTRAVENOUS | Status: DC
  Filled 2015-06-01: qty 3060

## 2015-06-01 MED ORDER — CLOPIDOGREL BISULFATE 75 MG PO TABS
75.0000 mg | ORAL_TABLET | Freq: Every day | ORAL | Status: DC
  Administered 2015-06-01 – 2015-06-06 (×6): 75 mg via ORAL
  Filled 2015-06-01 (×6): qty 1

## 2015-06-01 MED ORDER — ENSURE ENLIVE PO LIQD
237.0000 mL | Freq: Two times a day (BID) | ORAL | Status: DC
  Administered 2015-06-01 – 2015-06-06 (×8): 237 mL via ORAL

## 2015-06-01 MED ORDER — LEVOFLOXACIN 750 MG PO TABS
750.0000 mg | ORAL_TABLET | Freq: Every day | ORAL | Status: DC
  Administered 2015-06-02 – 2015-06-03 (×2): 750 mg via ORAL
  Filled 2015-06-01 (×3): qty 1

## 2015-06-01 MED ORDER — SODIUM CHLORIDE 0.9 % IV SOLN
4.0000 ug/kg/min | INTRAVENOUS | Status: DC
  Filled 2015-06-01: qty 50

## 2015-06-01 MED ORDER — LORAZEPAM 2 MG/ML IJ SOLN
0.5000 mg | Freq: Once | INTRAMUSCULAR | Status: AC
  Administered 2015-06-01: 0.5 mg via INTRAVENOUS

## 2015-06-01 NOTE — Progress Notes (Signed)
Home Progress Note Patient Name: Benjamin [name] DOB: [birth year] MRN: [id number]   Date of Service  06/01/2015  HPI/Events of Note  Anxiety   eICU Interventions  Will order: 1. Xanax 0.25 mg PO Q 6 hours PRN anxiety.     Intervention Category Minor Interventions: Agitation / anxiety - evaluation and management  Anselmo Reihl Eugene 06/01/2015, 9:02 PM

## 2015-06-01 NOTE — Discharge Instructions (Signed)

## 2015-06-01 NOTE — Progress Notes (Signed)
[facility] Progress Note Patient Name: Benjamin [name] DOB: 06/17/1955 MRN: [id number]   Date of Service  06/01/2015  HPI/Events of Note  Nurse reports patient c/o dry nares despite humidified oxygen.   eICU Interventions  Nasal saline prn.     Intervention Category Minor Interventions: Other:  Tera Partridge 06/01/2015, 6:19 AM

## 2015-06-01 NOTE — Progress Notes (Signed)
Nutrition Follow-up  DOCUMENTATION CODES:   Obesity unspecified  INTERVENTION:   Ensure Enlive po BID, each supplement provides 350 kcal and 20 grams of protein  NUTRITION DIAGNOSIS:   Inadequate oral intake related to poor appetite as evidenced by meal completion < 50%. Ongoing.   GOAL:   Patient will meet greater than or equal to 90% of their needs Progressing.   MONITOR:   PO intake, Supplement acceptance, I & O's  ASSESSMENT:   60yo male with hx COPD, CAD, AFib, combined CHF presented to [facility] ER 4/27 with 2 day hx SOB. He initially sought care at his PCP's office who sent him urgently to ER with respiratory distress. Pt with worsening respiratory acidosis and lethargy despite bipap attempts in ER and was intubated and tx to [facility] with AECOPD and probable HCAP. Had recent admission 01/31/15 through 02/02/15 for AECOPD  5/2 extubated, per MD ok to re-intubated if needed but no trach/PEG 5/3 Diet advanced, pt reports decreased appetite, sometimes gets short of breath eating, willing to try ensure.   Diet Order:  Diet Heart Room service appropriate?: Yes; Fluid consistency:: Thin  Skin:  Reviewed, no issues  Last BM:  unknown  Height:   Ht Readings from Last 1 Encounters:  05/26/15 5\' 11"  (1.803 m)    Weight:   Wt Readings from Last 1 Encounters:  06/01/15 224 lb 13.9 oz (102 kg)    Ideal Body Weight:  78.1 kg  BMI:  Body mass index is 31.38 kg/(m^2).  Estimated Nutritional Needs:   Kcal:  2000-2200  Protein:  100-115 grams  Fluid:  >1.5 L/day  EDUCATION NEEDS:   No education needs identified at this time  [place], [place], [place] Pager [phone] After Hours Pager

## 2015-06-01 NOTE — Progress Notes (Signed)
PULMONARY / CRITICAL CARE MEDICINE   Name: Benjamin [name] MRN: [id number] DOB: 11/21/1955    ADMISSION DATE:  05/26/2015  REFERRING MD:  Forestine Na   CHIEF COMPLAINT:  Respiratory failure   HISTORY OF PRESENT ILLNESS:   60 y/o male with hx COPD, CAD, AFib, combined CHF presented to [facility] ER 4/27 with 2 day hx SOB.  He initially sought care at his PCP's office who sent him urgently to ER with respiratory distress.  Pt with worsening respiratory acidosis and lethargy despite bipap attempts in ER.  He was intubated and tx to [facility] with AECOPD and probable HCAP.  Had recent admission 01/31/15 - 02/02/15 for AECOPD, discharged on azithro and prednisone taper.  SUBJECTIVE:   No acute events overnight, extubated and doing well.  VITAL SIGNS: BP 141/93 mmHg  Pulse 75  Temp(Src) 97.7 F (36.5 C) (Oral)  Resp 17  Ht 5\' 11"  (1.803 m)  Wt 102 kg (224 lb 13.9 oz)  BMI 31.38 kg/m2  SpO2 90%  HEMODYNAMICS:    VENTILATOR SETTINGS:    INTAKE / OUTPUT: I/O last 3 completed shifts: In: 2414.5 [P.O.:50; I.V.:979.5; NG/GT:485; IV [id number] Out: 5100 [Urine:5100]  PHYSICAL EXAMINATION: General: obese male in NAD, lying in bed, awake and interactive.  Extubated. Neuro: Alert and interactive, moving all ext to command. HEENT: [place]/AT. PERRL, sclerae anicteric. Cardiovascular: RRR, no M/R/G.  Lungs: Exp wheezes in all lung fields. Abdomen: BSx4 active, large abd  Musculoskeletal: No gross deformities, no edema.  Skin: Intact, warm, no rashes.  LABS:  BMET  Recent Labs Lab 05/30/15 0212 05/31/15 0217 06/01/15 0452  NA 144 147* 142  K 5.2* 4.7 4.7  CL 98* 96* 97*  CO2 36* 38* 35*  BUN 56* 65* 46*  CREATININE 0.92 0.89 0.73  GLUCOSE 211* 205* 121*   Electrolytes  Recent Labs Lab 05/30/15 0212 05/31/15 0217 06/01/15 0452  CALCIUM 8.6* 9.3 9.0  MG 2.9* 2.9* 2.6*  PHOS 3.1 3.4 4.3   CBC  Recent Labs Lab 05/30/15 0212 05/31/15 0217 06/01/15 0452  WBC 22.8*  25.6* 28.3*  HGB 8.8* 9.8* 10.3*  HCT 29.7* 33.9* 35.0*  PLT 392 410* 383    Coag's  Recent Labs Lab 05/30/15 1846 05/31/15 0219 06/01/15 0452  APTT 88* 76* 45*    Sepsis Markers  Recent Labs Lab 05/26/15 1430 05/26/15 2109 05/26/15 2200 05/27/15 0311 05/28/15 0325  LATICACIDVEN 1.1  --  1.2  --   --   PROCALCITON  --  3.02  --  2.92 1.73    ABG  Recent Labs Lab 05/27/15 0348 05/28/15 1543 05/31/15 0324  PHART 7.306* 7.342* 7.374  PCO2ART 68.6* 66.6* 74.6*  PO2ART 180.0* 77.0* 97.6    Liver Enzymes No results for input(s): AST, ALT, ALKPHOS, BILITOT, ALBUMIN in the last 168 hours.  Cardiac Enzymes  Recent Labs Lab 05/26/15 2200 05/27/15 0311 05/27/15 1304  TROPONINI <0.03 <0.03 0.06*    Glucose  Recent Labs Lab 05/31/15 0804 05/31/15 1210 05/31/15 1830 05/31/15 2302 06/01/15 0326 06/01/15 0736  GLUCAP 175* 143* 137* 140* 132* 117*    Imaging No results found.   STUDIES:  CXR 4/27 > emphysematous changes.  CULTURES: Sputum 4/27 >> NTD BC x 2 4/27 >>NTD Urine 4/27 >>NTD BAL 5/1>>>  ANTIBIOTICS: Vanc 4/27>>>5/3 Aztreonam 4/27>>>5/3 Levofloxacin 4/28>>>5/5  SIGNIFICANT EVENTS: 4/27- Admit, possible HCAP, ett placed  LINES/TUBES: ETT 4/27>>5/2  I reviewed CXR myself, hyperinflation noted.  DISCUSSION: 60 y.o. M admitted with AECOPD and possible  HCAP, failed BiPAP and required intubation.  ASSESSMENT / PLAN:  PULMONARY A:  Acute on chronic hypoxic / hypercarbic respiratory failure - followed by Dr. Luan Pulling, on O2 at baseline AECOPD. HCAP. P:   Titrate O2 for sat of 88-92%. Ok to reintubate if needs be but no trach/peg. Abx per ID section. Solumedrol 40 mg IV Q8, will keep IV for now as patient is still wheezing. Will need pulmonary rehab upon discharge. DuoNebs / Budesonide / Albuterol. Intermittent CXR.  CARDIOVASCULAR A: Combined CHF - last echo from 04/13/13 with EF 40-45%, grade 1DD. Hx CAD, AFib. P:   Restart eliquis and d/c heparin. ECHO>>>EF 55-60% and no tamponade. Continue outpatient plavix, atorvastatin. Restart outpatient eliquis Hold ouptatient bisoprolol, lasix, losartan . Hold heparin, may restart post bronch.  RENAL A: AKI - mild, baseline Scr ~0.8 P:   NS @ KVO D/C lasix. Trend BMP / UOP  Replace electrolytes as indicated.  GASTROINTESTINAL A: GI prophylaxis Nutrition P:   SUP: pantoprazole TF per nutrition   HEMATOLOGIC A: Anemia - mild Chronic Anticoagulation secondary to AF - apixaban as outpatient VTE prophylaxis P:  Transfuse for hgb < 7 SCD's/heparin gtt Trend CBC  INFECTIOUS A: CAP - last hospitalization in Jan 2017 PCN allergy P:   D/C vanc and aztreonam. Continue levaquin for a total of 8 days. Follow cultures.  ENDOCRINE A: Hyperglycemia - no hx DM. P:   SSI   NEUROLOGIC A: AMS - in setting of hypercarbia, resolving P:   D/C sedation. RASS goal: 0 to -1. Daily WUA. Hold outpatient gabapentin.  Discussed with bedside RN.  FAMILY  Updates:  Patient and daughter updated bedside.  Inter-disciplinary family meet or Palliative Care meeting due by:  06/01/15.  Rush Farmer, M.D. [facility] Pulmonary/Critical Care Medicine. Pager: [phone]. After hours pager: [phone].

## 2015-06-01 NOTE — Evaluation (Signed)
Physical Therapy Evaluation Patient Details Name: Benjamin [name] MRN: [id number] DOB: 06-04-1955 Today's Date: 06/01/2015   History of Present Illness  Pt is a 60 y/o M who presented to [facility] 4/27 w/ SOB.  Pt w/ worsening respiratory acidosis and lethargy despite BiPap and was intubated 4/27-5/2.  Pt's PMH includes COPD, CAD, A-fib, combined CHF, STEMI 10/2012.    Clinical Impression  Pt admitted with above diagnosis. Pt currently with functional limitations due to the deficits listed below (see PT Problem List). Pt was Ind PTA w/ ADLs and all mobility.  His main limitation currently is his poor respiratory status but anticipate that pt will progress well as this improves. He currently requires mod +2 assist for stand pivot due to fatigue.  Recommending CIR to reach mod I level of assist w/ family available to provide 24/7 assist/supervision at d/c if needed. Pt will benefit from skilled PT to increase their independence and safety with mobility to allow discharge to the venue listed below.       Follow Up Recommendations CIR;Supervision for mobility/OOB    Equipment Recommendations  Rolling walker with 5" wheels    Recommendations for Other Services Other (comment);OT consult;Rehab consult (Cardiopulmonary Rehab)     Precautions / Restrictions Precautions Precautions: Fall Precaution Comments: watch O2 Restrictions Weight Bearing Restrictions: No      Mobility  Bed Mobility Overal bed mobility: Needs Assistance Bed Mobility: Supine to Sit     Supine to sit: Min assist;HOB elevated     General bed mobility comments: Pt uses bed rail w/ HOB elevated and requires min assist reaching for therapist's hand to pull up to sitting.  Cues to scoot to EOB once sitting.  Increased time and effort.  SpO2 89% on 5L O2.  Transfers Overall transfer level: Needs assistance Equipment used: 2 person hand held assist Transfers: Sit to/from [facility] Sit to Stand:  Min assist;+2 physical assistance;+2 safety/equipment Stand pivot transfers: Mod assist;+2 physical assistance;+2 safety/equipment       General transfer comment: Assist to boost up to standing, pt rocks and uses momentum to stand.  Cues for upright posture once standing.  Pt fatiguing almost immediately w/ standing.  Mod +2 handheld assist to steady while pivoting as pt shuffles feet w/ flexed posture.    Ambulation/Gait             General Gait Details: Not able to assess at this time  Stairs            Wheelchair Mobility    Modified Rankin (Stroke Patients Only)       Balance Overall balance assessment: Needs assistance Sitting-balance support: Bilateral upper extremity supported;Feet supported Sitting balance-Leahy Scale: Poor Sitting balance - Comments: UE support to maintain balance sitting EOB due to increased WOB   Standing balance support: Bilateral upper extremity supported;During functional activity Standing balance-Leahy Scale: Poor Standing balance comment: Requires +2 handheld assist to maintain balance and upright while standing                             Pertinent Vitals/Pain Pain Assessment: No/denies pain    Home Living Family/patient expects to be discharged to:: Private residence Living Arrangements: Children Available Help at Discharge: Family;Available 24 hours/day Type of Home: Mobile home Home Access: Stairs to enter Entrance Stairs-Rails: Left;Right;Can reach both Entrance Stairs-Number of Steps: 4 Home Layout: One level Home Equipment: None Additional Comments: Lives w/ son who works during  the day but can come home at lunch to check on pt.  Daughter present during eval and reoprts she works but can take time off to stay w/ pt at d/c if needed.    Prior Function Level of Independence: Independent               Hand Dominance        Extremity/Trunk Assessment   Upper Extremity Assessment: Generalized  weakness           Lower Extremity Assessment: Generalized weakness         Communication   Communication: No difficulties  Cognition Arousal/Alertness: Awake/alert Behavior During Therapy: Flat affect Overall Cognitive Status: Within Functional Limits for tasks assessed                      General Comments General comments (skin integrity, edema, etc.): SpO2 down to 89% while sitting EOB but otherwise remains in the 90s, and SpO2 high 90s at end of session sitting upright in chair.  Other VSS.    Exercises        Assessment/Plan    PT Assessment Patient needs continued PT services  PT Diagnosis Generalized weakness;Difficulty walking   PT Problem List Decreased strength;Decreased activity tolerance;Decreased balance;Decreased mobility;Decreased knowledge of use of DME;Decreased safety awareness;Cardiopulmonary status limiting activity  PT Treatment Interventions DME instruction;Gait training;Functional mobility training;Therapeutic activities;Stair training;Therapeutic exercise;Balance training;Patient/family education   PT Goals (Current goals can be found in the Care Plan section) Acute Rehab PT Goals Patient Stated Goal: none stated PT Goal Formulation: With patient/family Time For Goal Achievement: 06/15/15 Potential to Achieve Goals: Good    Frequency Min 3X/week   Barriers to discharge Inaccessible home environment steps to enter home    Co-evaluation               End of Session Equipment Utilized During Treatment: Gait belt;Oxygen Activity Tolerance: Patient limited by fatigue Patient left: in chair;with call bell/phone within reach;with chair alarm set;with nursing/sitter in room Nurse Communication: Mobility status (RN was second person assist during session)         Time: 0942-1000 PT Time Calculation (min) (ACUTE ONLY): 18 min   Charges:   PT Evaluation $PT Eval Moderate Complexity: 1 Procedure     PT G Codes:        Collie Siad PT, DPT  Pager: [phone] Phone: [phone] 06/01/2015, 10:24 AM

## 2015-06-01 NOTE — Progress Notes (Signed)
ANTICOAGULATION CONSULT NOTE - Initial Consult  Pharmacy Consult for Eliquis Indication: atrial fibrillation  Allergies  Allergen Reactions  . Codeine     Red blotches  . Penicillins     Childhood reaction    Patient Measurements: Height: 5\' 11"  (180.3 cm) Weight: 224 lb 13.9 oz (102 kg) IBW/kg (Calculated) : 75.3  Vital Signs: Temp: 97.7 F (36.5 C) (05/03 0324) Temp Source: Oral (05/03 0324) BP: 141/93 mmHg (05/03 0800) Pulse Rate: 75 (05/03 0800)  Labs:  Recent Labs  05/30/15 0212  05/30/15 1846 05/31/15 0217 05/31/15 0219 05/31/15 1202 05/31/15 1925 06/01/15 0452  HGB 8.8*  --   --  9.8*  --   --   --  10.3*  HCT 29.7*  --   --  33.9*  --   --   --  35.0*  PLT 392  --   --  410*  --   --   --  383  APTT  --   < > 88*  --  76*  --   --  45*  HEPARINUNFRC 1.20*  --  1.22* 1.22*  --  1.07* 0.74* 0.46  CREATININE 0.92  --   --  0.89  --   --   --  0.73  < > = values in this interval not displayed.  Estimated Creatinine Clearance: 120.9 mL/min (by C-G formula based on Cr of 0.73).   Medical History: Past Medical History  Diagnosis Date  . Tobacco abuse     40 Pk-yr (1- 1 1/2 PPD) --> Quit 11/18/2012 when presented with STEMI  . COPD (chronic obstructive pulmonary disease) ([place])   . STEMI of inferior wall 11/18/12- RCA BMS 11/17/2012    Cardiogenic shock - post MI with RV infarction [785.51]  . CAD S/P percutaneous coronary angioplasty  11/18/2012    - PCI mid RCA - 5.0 mm x 24 mm VeriFlex BMS (5.6 mm)  . Atrial fibrillation with RVR - peri-MI; recurrent after DCCV 11/19/2012  . Ischemic cardiomyopathy 11/19/2012    EF 40-45% with inferoseptal HK, Gr 2DD - high LVEDP/LAP. --> confirmed by f/u Echo 03/2013.  [name] Dyslipidemia- low HDL 11/19/2012  . Chronic combined systolic and diastolic heart failure, NYHA class 2 ([place]) 11/17/2012  . Colon polyp     Status post polypectomy x3.   Assessment:  60 y/o M with hx of afib. Pt has been managed with heparin gtt  but now pharmacy consulted to restart Eliquis. CBC stable. Eliquis 5mg  BID PTA.   Goal of Therapy:  Monitor platelets by anticoagulation protocol: Yes  Plan:  Apixaban po 5mg  BID  Monitor CBC, renal function Pharmacy will sign off  Angela Burke, PharmD Pharmacy Resident Pager: [phone] 06/01/2015,10:03 AM

## 2015-06-01 NOTE — Evaluation (Signed)
Clinical/Bedside Swallow Evaluation Patient Details  Name: DOMANICK GRANIERI MRN: [id number] Date of Birth: January 21, 1956  Today's Date: 06/01/2015 Time: SLP Start Time (ACUTE ONLY): 76 SLP Stop Time (ACUTE ONLY): 1031 SLP Time Calculation (min) (ACUTE ONLY): 12 min  Past Medical History:  Past Medical History  Diagnosis Date  . Tobacco abuse     40 Pk-yr (1- 1 1/2 PPD) --> Quit 11/18/2012 when presented with STEMI  . COPD (chronic obstructive pulmonary disease) ([place])   . STEMI of inferior wall 11/18/12- RCA BMS 11/17/2012    Cardiogenic shock - post MI with RV infarction [785.51]  . CAD S/P percutaneous coronary angioplasty  11/18/2012    - PCI mid RCA - 5.0 mm x 24 mm VeriFlex BMS (5.6 mm)  . Atrial fibrillation with RVR - peri-MI; recurrent after DCCV 11/19/2012  . Ischemic cardiomyopathy 11/19/2012    EF 40-45% with inferoseptal HK, Gr 2DD - high LVEDP/LAP. --> confirmed by f/u Echo 03/2013.  [name] Dyslipidemia- low HDL 11/19/2012  . Chronic combined systolic and diastolic heart failure, NYHA class 2 ([place]) 11/17/2012  . Colon polyp     Status post polypectomy x3.   Past Surgical History:  Past Surgical History  Procedure Laterality Date  . Dental extractions    . Coronary angioplasty with stent placement Right 11/17/12    Mid RCA aspiration thrombectomy and PCI with VeriFlex BMS 5.0 mm x 24 mm (5.6 mm; also PTCA of distal RPL 4 distal embolization.  . Transthoracic echocardiogram  11/18/2012    Normal LV size, moderate reduced function 40-45% with severe HK-AK of the inferoseptal wall with incoordinate septal motion.  . Transthoracic echocardiogram  March 15    Moderately reduced LVEF of 40 at 45% with inferoseptal hypokinesis and high filling pressures.  . Colonoscopy N/A 11/20/2013    Procedure: COLONOSCOPY;  Surgeon: Jamesetta So, MD;  Location: [facility];  Service: Gastroenterology;  Laterality: N/A;  . Left heart catheterization with coronary angiogram N/A 11/17/2012     Procedure: LEFT HEART CATHETERIZATION WITH CORONARY ANGIOGRAM;  Surgeon: Leonie Man, MD;  Location: [facility] CATH LAB;  Service: Cardiovascular;  Laterality: N/A;   HPI:  60 y/o male with hx COPD, CAD, AFib, combined CHF presented to [facility] ER 4/27 with 2 day hx SOB. He initially sought care at his PCP's office who sent him urgently to ER with respiratory distress. Pt with worsening respiratory acidosis and lethargy despite bipap attempts in ER. He was intubated and tx to [facility] with AECOPD and probable HCAP. Had recent admission 01/31/15 - 02/02/15 for AECOPD. ETT 4/27>>5/2   Assessment / Plan / Recommendation Clinical Impression  Pt presents with functional oropharyngeal swallow marked by adequate respiratory-swallowing reciprocity (RR within range sufficient for apneic period post-swallow; reliable expiration post-swallow). Pt passed 3 oz water test with no overt s/s of aspiration.  Mastication normal despite absence of teeth.  Recommend continuing regular diet, thin liquids.  Use caution with initial meals (slow rate, small boluses) and give meds whole with puree.  No SLP f/u is needed. D/W pt, RN.      Aspiration Risk    minimal    Diet Recommendation   regular, thin liquids  Medication Administration: Whole meds with puree    Other  Recommendations Oral Care Recommendations: Oral care BID   Follow up Recommendations  None    Frequency and Duration            Prognosis  Swallow Study   General Date of Onset: 05/26/15 HPI: 60 y/o male with hx COPD, CAD, AFib, combined CHF presented to [facility] ER 4/27 with 2 day hx SOB. He initially sought care at his PCP's office who sent him urgently to ER with respiratory distress. Pt with worsening respiratory acidosis and lethargy despite bipap attempts in ER. He was intubated and tx to [facility] with AECOPD and probable HCAP. Had recent admission 01/31/15 - 02/02/15 for AECOPD. ETT 4/27>>5/2 Type of Study: Bedside Swallow  Evaluation Previous Swallow Assessment: no Diet Prior to this Study: Regular;Thin liquids Temperature Spikes Noted: No Respiratory Status: Nasal cannula History of Recent Intubation: Yes Length of Intubations (days): 5 days Date extubated: 05/31/15 Behavior/Cognition: Alert;Cooperative Oral Cavity Assessment: Within Functional Limits Oral Care Completed by SLP: No Oral Cavity - Dentition: Edentulous Vision: Functional for self-feeding Self-Feeding Abilities: Able to feed self Patient Positioning: Upright in chair Baseline Vocal Quality: Normal Volitional Cough: Strong Volitional Swallow: Able to elicit    Oral/Motor/Sensory Function Overall Oral Motor/Sensory Function: Within functional limits   Ice Chips Ice chips: Within functional limits   Thin Liquid Thin Liquid: Within functional limits Presentation: Cup;Straw;Self Fed    Nectar Thick Nectar Thick Liquid: Not tested   Honey Thick Honey Thick Liquid: Not tested   Puree Puree: Within functional limits Presentation: [place]. [place], [place] CCC/SLP Pager [phone]    Solid: Within functional limits Presentation: Self Fed        Juan Quam Laurice 06/01/2015,11:43 AM

## 2015-06-01 NOTE — Progress Notes (Signed)
Pt arrived to [place] around 2300.  Pt is AOx3, 5L [place] sats low 90's, strong/productive cough, no complaints of pain, pt is extremely weak/deconditioned. Pt was instructed how to use the call bell and not to get up without assistance, bed alarm is on. RN will continue to monitor.

## 2015-06-01 NOTE — Care Management Important Message (Signed)
Important Message  Patient Details  Name: Benjamin [name] MRN: [id number] Date of Birth: 10-Apr-1955   Medicare Important Message Given:  Yes    [name] Abena 06/01/2015, 12:00 PM

## 2015-06-01 NOTE — Progress Notes (Signed)
Rehab Admissions Coordinator Note:  Patient was screened by Cleatrice Burke for appropriateness for an Inpatient Acute Rehab Consult per PT recommendation. At this time, we are recommending await further progress with therapy before determining rehab venue options. Cleatrice Burke 06/01/2015, 1:23 PM  I can be reached at [phone].

## 2015-06-02 DIAGNOSIS — A413 Sepsis due to Hemophilus influenzae: Secondary | ICD-10-CM

## 2015-06-02 DIAGNOSIS — J9600 Acute respiratory failure, unspecified whether with hypoxia or hypercapnia: Secondary | ICD-10-CM

## 2015-06-02 LAB — GLUCOSE, CAPILLARY
GLUCOSE-CAPILLARY: 106 mg/dL — AB (ref 65–99)
GLUCOSE-CAPILLARY: 109 mg/dL — AB (ref 65–99)
GLUCOSE-CAPILLARY: 112 mg/dL — AB (ref 65–99)
GLUCOSE-CAPILLARY: 112 mg/dL — AB (ref 65–99)
Glucose-Capillary: 134 mg/dL — ABNORMAL HIGH (ref 65–99)
Glucose-Capillary: 129 mg/dL — ABNORMAL HIGH (ref 65–99)
Glucose-Capillary: 115 mg/dL — ABNORMAL HIGH (ref 65–99)

## 2015-06-02 LAB — BASIC METABOLIC PANEL
ANION GAP: 12 (ref 5–15)
BUN: 41 mg/dL — ABNORMAL HIGH (ref 6–20)
CALCIUM: 9.2 mg/dL (ref 8.9–10.3)
CO2: 32 mmol/L (ref 22–32)
CREATININE: 0.8 mg/dL (ref 0.61–1.24)
Chloride: 95 mmol/L — ABNORMAL LOW (ref 101–111)
GFR calc Af Amer: >60 mL/min (ref >60)
GFR calc non Af Amer: >60 mL/min (ref >60)
GLUCOSE: 134 mg/dL — AB (ref 65–99)
Potassium: 5.3 mmol/L — ABNORMAL HIGH (ref 3.5–5.1)
Sodium: 139 mmol/L (ref 135–145)

## 2015-06-02 LAB — CULTURE, BAL-QUANTITATIVE
CULTURE: NO GROWTH
SPECIAL REQUESTS: NORMAL

## 2015-06-02 LAB — MAGNESIUM: Magnesium: 2.5 mg/dL — ABNORMAL HIGH (ref 1.7–2.4)

## 2015-06-02 LAB — CBC
HEMATOCRIT: 36.3 % — AB (ref 39.0–52.0)
Hemoglobin: 10.8 g/dL — ABNORMAL LOW (ref 13.0–17.0)
MCH: 28.1 pg (ref 26.0–34.0)
MCHC: 29.8 g/dL — AB (ref 30.0–36.0)
MCV: 94.5 fL (ref 78.0–100.0)
Platelets: 416 10*3/uL — ABNORMAL HIGH (ref 150–400)
RBC: 3.84 MIL/uL — ABNORMAL LOW (ref 4.22–5.81)
RDW: 15.9 % — AB (ref 11.5–15.5)
WBC: 22.2 10*3/uL — ABNORMAL HIGH (ref 4.0–10.5)

## 2015-06-02 LAB — CULTURE, BAL-QUANTITATIVE W GRAM STAIN

## 2015-06-02 LAB — PHOSPHORUS: Phosphorus: 5.8 mg/dL — ABNORMAL HIGH (ref 2.5–4.6)

## 2015-06-02 MED ORDER — POLYETHYLENE GLYCOL 3350 17 G PO PACK
17.0000 g | PACK | Freq: Every day | ORAL | Status: DC
  Administered 2015-06-02 – 2015-06-06 (×2): 17 g via ORAL
  Filled 2015-06-02 (×2): qty 1

## 2015-06-02 MED ORDER — IPRATROPIUM BROMIDE 0.02 % IN SOLN
0.5000 mg | Freq: Four times a day (QID) | RESPIRATORY_TRACT | Status: AC
Start: 2015-06-02 — End: 2015-06-04
  Administered 2015-06-02 – 2015-06-04 (×7): 0.5 mg via RESPIRATORY_TRACT
  Filled 2015-06-02 (×7): qty 2.5

## 2015-06-02 MED ORDER — INSULIN ASPART 100 UNIT/ML ~~LOC~~ SOLN
0.0000 [IU] | Freq: Three times a day (TID) | SUBCUTANEOUS | Status: DC
  Administered 2015-06-04: 3 [IU] via SUBCUTANEOUS
  Administered 2015-06-05: 2 [IU] via SUBCUTANEOUS

## 2015-06-02 MED ORDER — LEVALBUTEROL HCL 0.63 MG/3ML IN NEBU
0.6300 mg | INHALATION_SOLUTION | Freq: Four times a day (QID) | RESPIRATORY_TRACT | Status: AC
Start: 2015-06-02 — End: 2015-06-04
  Administered 2015-06-02 – 2015-06-04 (×7): 0.63 mg via RESPIRATORY_TRACT
  Filled 2015-06-02 (×7): qty 3

## 2015-06-02 MED ORDER — SENNOSIDES-DOCUSATE SODIUM 8.6-50 MG PO TABS
1.0000 | ORAL_TABLET | Freq: Two times a day (BID) | ORAL | Status: DC
  Administered 2015-06-02 – 2015-06-06 (×6): 1 via ORAL
  Filled 2015-06-02 (×7): qty 1

## 2015-06-02 MED ORDER — METHYLPREDNISOLONE SODIUM SUCC 40 MG IJ SOLR
40.0000 mg | Freq: Two times a day (BID) | INTRAMUSCULAR | Status: DC
  Administered 2015-06-03: 40 mg via INTRAVENOUS
  Filled 2015-06-02 (×2): qty 1

## 2015-06-02 MED ORDER — LEVALBUTEROL HCL 0.63 MG/3ML IN NEBU: 0.6300 mg | INHALATION_SOLUTION | RESPIRATORY_TRACT | Status: DC | PRN

## 2015-06-02 NOTE — Progress Notes (Signed)
[place] TEAM 1 - Stepdown/ICU TEAM PROGRESS NOTE  Benjamin [name] [id number] DOB: [birth year] DOA: 05/26/2015 PCP: [name], MD  Admit HPI / Brief Narrative: 60 y/o male with hx COPD, CAD, AFib, and combined CHF who presented to [facility] ER 4/27 with 2 day hx SOB. He initially sought care at his PCP's office who sent him urgently to the ER with respiratory distress. Pt with worsening respiratory acidosis and lethargy despite BIPAP in ER. He was intubated and tx to [facility] with AECOPD and probable HCAP. Had admission 01/31/15 - 02/02/15 for AECOPD, discharged on azithro and prednisone taper.  Significant Events: 4/27 admit, possible HCAP - failed BIPAP - intubated 5/1 bronchoscopy  5/2 extubated   HPI/Subjective: The patient is sitting up on the side of the bed and modest respiratory distress.  He is using assess her muscles to assist with his breathing.  He states "this is about normal for me" relating that he has severe COPD.  He denies chest pain nausea vomiting or abdominal pain.  Assessment/Plan:  Acute on chronic hypoxic / hypercarbic respiratory failure due to acute COPD exacerbation and H flu Pneumonia  on 4L O2 at baseline - sats stable but still requiring 5L [place] - now on 8th day of abx - will stop abx after last dose today - aggressive neb tx    Chronic Combined Systolic and Diastolic CHF TTE 08/09/63 w/ EF 40-45%, grade 1DD - TTE 05/28/15 w/ EF 55-60% - no evidence of significant volume overload at present  CAD Continue outpatient plavix, atorvastatin - denies chest pain - with hold beta blocker for now due to ongoing refractory wheezing  Chronic AFib eliquis - NSR at present   AKI Resolved   Recent Labs     05/31/15  0217  06/01/15  0452  06/02/15  0232  CREATININE  0.89  0.73  0.80    Anemia - mild Hgb stable   PCN allergy  Hyperglycemia no hx DM - CBG much improved - taper steroids asap - check A1c  AMS  Code Status: FULL but NO  trach/peg/SNF or chronic vent Family Communication: no family present at time of exam Disposition Plan: SDU  Consultants: PCCM  Antibiotics: Vanc 4/27 > 5/2 Aztreonam 4/27 > 5/2 Levofloxacin 4/28 >  DVT prophylaxis: eliquis   Objective: Blood pressure 139/98, pulse 85, temperature 98.8 F (37.1 C), temperature source Oral, resp. rate 22, height _0  (1.803 m), weight 101 kg (222 lb 10.6 oz), SpO2 95 %.  Intake/Output Summary (Last 24 hours) at 06/02/15 1012 Last data filed at 06/02/15 0811  Gross per 24 hour  Intake     13 ml  Output   1195 ml  Net  -1182 ml    Exam: General: modest resp distress using accessory muscles  Lungs: Diffuse expiratory wheeze with no focal crackles Cardiovascular: Regular rate and rhythm without murmur gallop or rub Abdomen: Nontender, nondistended, soft, bowel sounds positive, no rebound, no ascites, no appreciable mass Extremities: No significant cyanosis, clubbing, or edema bilateral lower extremities  Data Reviewed: Basic Metabolic Panel:  Recent Labs Lab 05/29/15 0500 05/30/15 0212 05/31/15 0217 06/01/15 0452 06/02/15 0232  NA 142 144 147* 142 139  K 4.4 5.2* 4.7 4.7 5.3*  CL 100* 98* 96* 97* 95*  CO2 31 36* 38* 35* 32  GLUCOSE 272* 211* 205* 121* 134*  BUN 43* 56* 65* 46* 41*  CREATININE 0.83 0.92 0.89 0.73 0.80  CALCIUM 8.6* 8.6* 9.3 9.0 9.2  MG  2.9* 2.9* 2.9* 2.6* 2.5*  PHOS 2.7 3.1 3.4 4.3 5.8*    CBC:  Recent Labs Lab 05/26/15 1410  05/29/15 0500 05/30/15 0212 05/31/15 0217 06/01/15 0452 06/02/15 0232  WBC 18.8*  < > 13.4* 22.8* 25.6* 28.3* 22.2*  NEUTROABS 15.3*  --   --   --   --   --   --   HGB 11.1*  < > 9.0* 8.8* 9.8* 10.3* 10.8*  HCT 36.4*  < > 30.6* 29.7* 33.9* 35.0* 36.3*  MCV 94.1  < > 96.2 97.1 96.0 96.2 94.5  PLT 373  < > 353 392 410* 383 416*  < > = values in this interval not displayed.  Liver Function Tests: No results for input(s): AST, ALT, ALKPHOS, BILITOT, PROT, ALBUMIN in the last 168  hours. No results for input(s): LIPASE, AMYLASE in the last 168 hours. No results for input(s): AMMONIA in the last 168 hours.   Recent Labs Lab 05/29/15 1937 05/30/15 0440 05/30/15 1846 05/31/15 0219 06/01/15 0452  APTT 101* 108* 88* 76* 45*    Cardiac Enzymes:  Recent Labs Lab 05/26/15 1410 05/26/15 2109 05/26/15 2200 05/27/15 0311 05/27/15 1304  TROPONINI 0.03 <0.03 <0.03 <0.03 0.06*    CBG:  Recent Labs Lab 06/01/15 1706 06/01/15 1933 06/02/15 0007 06/02/15 0402 06/02/15 0806  GLUCAP 75 134* 112* 129* 106*    Recent Results (from the past 240 hour(s))  Urine culture     Status: None   Collection Time: 05/26/15  2:00 PM  Result Value Ref Range Status   Specimen Description URINE, CATHETERIZED  Final   Special Requests NONE  Final   Culture   Final    NO GROWTH 2 DAYS Performed at [facility]    Report Status 05/28/2015 FINAL  Final  MRSA PCR Screening     Status: None   Collection Time: 05/26/15  7:34 PM  Result Value Ref Range Status   MRSA by PCR NEGATIVE NEGATIVE Final    Comment:        The GeneXpert MRSA Assay (FDA approved for NASAL specimens only), is one component of a comprehensive MRSA colonization surveillance program. It is not intended to diagnose MRSA infection nor to guide or monitor treatment for MRSA infections.   Culture, respiratory (NON-Expectorated)     Status: None   Collection Time: 05/26/15  8:00 PM  Result Value Ref Range Status   Specimen Description TRACHEAL ASPIRATE  Final   Special Requests Normal  Final   Gram Stain   Final    FEW WBC PRESENT,BOTH PMN AND MONONUCLEAR NO SQUAMOUS EPITHELIAL CELLS SEEN FEW GRAM NEGATIVE COCCOBACILLI Performed at [facility]    Culture   Final    FEW HAEMOPHILUS INFLUENZAE Note: BETA LACTAMASE POSITIVE Performed at [facility]    Report Status 05/30/2015 FINAL  Final  Urine culture     Status: None   Collection Time: 05/26/15  8:24 PM  Result  Value Ref Range Status   Specimen Description URINE, CATHETERIZED  Final   Special Requests Normal  Final   Culture NO GROWTH 2 DAYS  Final   Report Status 05/28/2015 FINAL  Final  Culture, blood (Routine X 2) w Reflex to ID Panel     Status: None   Collection Time: 05/26/15  9:02 PM  Result Value Ref Range Status   Specimen Description BLOOD LEFT HAND  Final   Special Requests IN PEDIATRIC BOTTLE 3CC  Final   Culture NO GROWTH  5 DAYS  Final   Report Status 05/31/2015 FINAL  Final  Culture, blood (Routine X 2) w Reflex to ID Panel     Status: None   Collection Time: 05/26/15  9:09 PM  Result Value Ref Range Status   Specimen Description BLOOD LEFT ANTECUBITAL  Final   Special Requests BOTTLES DRAWN AEROBIC AND ANAEROBIC 5CC   Final   Culture NO GROWTH 5 DAYS  Final   Report Status 05/31/2015 FINAL  Final  Culture, bal-quantitative     Status: None   Collection Time: 05/30/15 10:35 AM  Result Value Ref Range Status   Specimen Description BRONCHIAL ALVEOLAR LAVAGE  Final   Special Requests Normal  Final   Gram Stain   Final    ABUNDANT WBC PRESENT,BOTH PMN AND MONONUCLEAR NO SQUAMOUS EPITHELIAL CELLS SEEN NO ORGANISMS SEEN Performed at [facility]    Culture   Final    NO GROWTH 2 DAYS Performed at [facility]    Report Status 06/02/2015 FINAL  Final  Pneumocystis smear by DFA     Status: None   Collection Time: 05/30/15 10:35 AM  Result Value Ref Range Status   Specimen Source-[facility] BRONCHIAL ALVEOLAR LAVAGE  Final   Pneumocystis jiroveci Ag NEGATIVE  Final    Comment: PERFORMED AT [facility]     Studies:   Recent x-ray studies have been reviewed in detail by the Attending Physician  Scheduled Meds:  Scheduled Meds: . apixaban  5 mg Oral BID  . atorvastatin  40 mg Per Tube q1800  . budesonide (PULMICORT) nebulizer solution  0.5 mg Nebulization BID  . chlorhexidine gluconate (SAGE KIT)  15 mL Mouth Rinse BID  . clopidogrel   75 mg Oral Daily  . feeding supplement (ENSURE ENLIVE)  237 mL Oral BID BM  . insulin aspart  0-15 Units Subcutaneous Q4H  . ipratropium-albuterol  3 mL Nebulization Q4H  . levofloxacin  750 mg Oral Daily  . methylPREDNISolone (SOLU-MEDROL) injection  40 mg Intravenous Q8H  . multivitamin with minerals  1 tablet Per Tube Daily  . pantoprazole  40 mg Oral QHS  . polyethylene glycol  17 g Oral Daily  . senna-docusate  1 tablet Oral BID    Time spent on care of this patient: 35 mins   Hamna Asa T , MD   [facility] Office  [phone] Pager - Text Page per Shea Evans as per below:  On-Call/Text Page:      [email or web address]      password [email or web address]  If 7PM-7AM, please contact night-coverage [email or web address] Password [email or web address] 06/02/2015, 10:12 AM   LOS: 7 days

## 2015-06-02 NOTE — Clinical Social Work Note (Signed)
Clinical Social Work Assessment  Patient Details  Name: Benjamin [name] MRN: [id number] Date of Birth: 11/01/1955  Date of referral:  06/02/15               Reason for consult:  Facility Placement                Permission sought to share information with:  Chartered certified accountant granted to share information::  Yes, Verbal Permission Granted  Name::        Agency::  [facility]  Relationship::     Contact Information:     Housing/Transportation Living arrangements for the past 2 months:  [place] of Information:  Patient Patient Interpreter Needed:  None Criminal Activity/Legal Involvement Pertinent to Current Situation/Hospitalization:  No - Comment as needed Significant Relationships:  Adult Children Lives with:  Adult Children Do you feel safe going back to the place where you live?  No Need for family participation in patient care:  No (Coment)  Care giving concerns:  Pt lives at home with son- does not have enough supervision/physical assistance to return home safely at this time.   Social Worker assessment / plan:  CSW spoke with pt concerning PT recommendation for CIR vs SNF- spoke with pt about SNF and SNF referral process.  Employment status:  Retired Nurse, adult PT Recommendations:  [place] / Referral to community resources:  [place]  Patient/Family's Response to care:  Pt is agreeable to SNF as alternate option to [employer]- prefers [employer]  Patient/Family's Understanding of and Emotional Response to Diagnosis, Current Treatment, and Prognosis:  No questions or concerns- hopeful for a quick recovery with rehab  Emotional Assessment Appearance:  Appears older than stated age Attitude/Demeanor/Rapport:    Affect (typically observed):  Appropriate Orientation:  Oriented to Self, Oriented to Place, Oriented to  Time, Oriented to  Situation Alcohol / Substance use:  Not Applicable Psych involvement (Current and /or in the community):  No (Comment)  Discharge Needs  Concerns to be addressed:  Care Coordination Readmission within the last 30 days:  No Current discharge risk:  Physical Impairment Barriers to Discharge:  Continued Medical Work up   [employer], LCSW 06/02/2015, 5:05 PM

## 2015-06-02 NOTE — Progress Notes (Signed)
Pt has been very restless all night and uncomfortable but no complaints of pain. Pt feels like he wants to have a BM but cannot.  (attempted x2 on bedpan) May need some laxative/stool softener, no BM has been charted since he was admitted? Pt is currently sitting on the side of the bed and instructed he is not allowed to get up without assistance, bed alarm is on, RN will continue to monitor.

## 2015-06-02 NOTE — Progress Notes (Addendum)
PULMONARY / CRITICAL CARE MEDICINE   Name: Benjamin [name] MRN: [id number] DOB: 1955-07-13    ADMISSION DATE:  05/26/2015  REFERRING MD:  Forestine Na   CHIEF COMPLAINT:  Respiratory failure   HISTORY OF PRESENT ILLNESS:   60 y/o male with hx COPD, CAD, AFib, combined CHF presented to [facility] ER 4/27 with 2 day hx SOB.  He initially sought care at his PCP's office who sent him urgently to ER with respiratory distress.  Pt with worsening respiratory acidosis and lethargy despite bipap attempts in ER.  He was intubated and tx to [facility] with AECOPD and probable HCAP.  Had recent admission 01/31/15 - 02/02/15 for AECOPD, discharged on azithro and prednisone taper.  SUBJECTIVE:  Agitated overnight now resting comfortably and doing well.  VITAL SIGNS: BP 139/98 mmHg  Pulse 85  Temp(Src) 98.8 F (37.1 C) (Oral)  Resp 22  Ht 5\' 11"  (1.803 m)  Wt 222 lb 10.6 oz (101 kg)  BMI 31.07 kg/m2  SpO2 92%       INTAKE / OUTPUT: I/O last 3 completed shifts: In: 1103.7 [P.O.:100; I.V.:303.7; IV Piggyback:700] Out: 3020 [Urine:3020]  PHYSICAL EXAMINATION: General: obese male in NAD, lying in bed, awake and interactive.  Neuro: Alert and interactive, moving all ext to command. HEENT: [place]/AT. PERRL, sclerae anicteric. Cardiovascular: RRR, no M/R/G.  Lungs: Respiratory rate even non labored, Exp wheezes in all lung fields. Abdomen: BSx4 hypoactive, large abd  Musculoskeletal: No gross deformities, no edema.  Skin: Intact, warm, no rashes.  LABS:  BMET  Recent Labs Lab 05/31/15 0217 06/01/15 0452 06/02/15 0232  NA 147* 142 139  K 4.7 4.7 5.3*  CL 96* 97* 95*  CO2 38* 35* 32  BUN 65* 46* 41*  CREATININE 0.89 0.73 0.80  GLUCOSE 205* 121* 134*   Electrolytes  Recent Labs Lab 05/31/15 0217 06/01/15 0452 06/02/15 0232  CALCIUM 9.3 9.0 9.2  MG 2.9* 2.6* 2.5*  PHOS 3.4 4.3 5.8*   CBC  Recent Labs Lab 05/31/15 0217 06/01/15 0452 06/02/15 0232  WBC 25.6* 28.3* 22.2*  HGB  9.8* 10.3* 10.8*  HCT 33.9* 35.0* 36.3*  PLT 410* 383 416*    Coag's  Recent Labs Lab 05/30/15 1846 05/31/15 0219 06/01/15 0452  APTT 88* 76* 45*    Sepsis Markers  Recent Labs Lab 05/26/15 1430 05/26/15 2109 05/26/15 2200 05/27/15 0311 05/28/15 0325  LATICACIDVEN 1.1  --  1.2  --   --   PROCALCITON  --  3.02  --  2.92 1.73    ABG  Recent Labs Lab 05/27/15 0348 05/28/15 1543 05/31/15 0324  PHART 7.306* 7.342* 7.374  PCO2ART 68.6* 66.6* 74.6*  PO2ART 180.0* 77.0* 97.6    Liver Enzymes No results for input(s): AST, ALT, ALKPHOS, BILITOT, ALBUMIN in the last 168 hours.  Cardiac Enzymes  Recent Labs Lab 05/26/15 2200 05/27/15 0311 05/27/15 1304  TROPONINI <0.03 <0.03 0.06*    Glucose  Recent Labs Lab 06/01/15 1239 06/01/15 1706 06/01/15 1933 06/02/15 0007 06/02/15 0402 06/02/15 0806  GLUCAP 148* 75 134* 112* 129* 106*    Imaging No results found.   STUDIES:  CXR 4/27 > emphysematous changes.  CULTURES: MRSA PCR 4/28>> negative Sputum 4/27 >> NTD BC x 2 4/27 >>NTD Urine 4/29 >>NTD BAL 5/4>>>No growth MRSA PCR 4/28>> negative  ANTIBIOTICS: Vanc 4/27>>>5/3 Aztreonam 4/27>>>5/3 Levofloxacin 4/28>>>5/5  SIGNIFICANT EVENTS: 4/27- Admit, possible HCAP, ett placed  LINES/TUBES: ETT 4/27>>5/2  I reviewed CXR myself, hyperinflation noted.  DISCUSSION: 60  y.o. M admitted with AECOPD and possible HCAP, failed BiPAP and required intubation.  ASSESSMENT / PLAN:  PULMONARY A:  Acute on chronic hypoxic / hypercarbic respiratory failure - followed by Dr. Luan Pulling, on O2 at baseline AECOPD. HCAP. P:   Titrate O2 for sat of 88-92%. Ok to reintubate if needs be but no trach/peg. Abx per ID section. Solumedrol 40 mg IV Q8, will keep IV for now as patient is still wheezing. Will need pulmonary rehab upon discharge. DuoNebs / Budesonide / Albuterol. Intermittent CXR.  CARDIOVASCULAR A: Combined CHF - last echo from 04/13/13 with EF  40-45%, grade 1DD. Hx CAD, AFib. P:  Continue eliquis  ECHO>>>EF 55-60% and no tamponade. Continue outpatient plavix, atorvastatin. Restart outpatient eliquis Hold ouptatient bisoprolol, lasix, losartan .  INFECTIOUS A: CAP - last hospitalization in Jan 2017 PCN allergy P:   Continue levaquin for a total of 8 days. WBC trending down Follow cultures.  NEUROLOGIC A: Alert and oriented P:   PRN Alprazolam q6hr prn for anxiety Hold outpatient gabapentin.  FAMILY  Updates:  No family at bedside.  Inter-disciplinary family meet or Palliative Care meeting due by:  06/01/15.  Marda Stalker, AGNP  Pulmonary/Critical Care   STAFF NOTE: I, Merrie Roof, MD FACP have personally reviewed patient's available data, including medical history, events of note, physical examination and test results as part of my evaluation. I have discussed with resident/NP and other care providers such as pharmacist, RN and RRT. In addition, I personally evaluated patient and elicited key findings of: awake, no distress, large belly, less wheezing, last pcxr on 5/2 with some increase int changes bases = atx?, would repeat pcxr in am , IS addition, levo to 7-8 days, would reduce solumedral to q12h, will re assess in am , mobilize [employer]. Titus Mould, MD, [place] Pgr: [facility] 06/02/2015 5:01 PM

## 2015-06-03 ENCOUNTER — Inpatient Hospital Stay (HOSPITAL_COMMUNITY): Payer: Medicare Other

## 2015-06-03 LAB — COMPREHENSIVE METABOLIC PANEL
ALBUMIN: 2.6 g/dL — AB (ref 3.5–5.0)
ALT: 18 U/L (ref 17–63)
ANION GAP: 15 (ref 5–15)
AST: 17 U/L (ref 15–41)
Alkaline Phosphatase: 52 U/L (ref 38–126)
BILIRUBIN TOTAL: 0.7 mg/dL (ref 0.3–1.2)
BUN: 38 mg/dL — ABNORMAL HIGH (ref 6–20)
CALCIUM: 9.5 mg/dL (ref 8.9–10.3)
CHLORIDE: 94 mmol/L — AB (ref 101–111)
CO2: 34 mmol/L — ABNORMAL HIGH (ref 22–32)
Creatinine, Ser: 0.87 mg/dL (ref 0.61–1.24)
GFR calc Af Amer: >60 mL/min (ref >60)
GLUCOSE: 105 mg/dL — AB (ref 65–99)
POTASSIUM: 4.6 mmol/L (ref 3.5–5.1)
Sodium: 143 mmol/L (ref 135–145)
TOTAL PROTEIN: 6.9 g/dL (ref 6.5–8.1)

## 2015-06-03 LAB — CBC
HEMATOCRIT: 37.9 % — AB (ref 39.0–52.0)
Hemoglobin: 11.1 g/dL — ABNORMAL LOW (ref 13.0–17.0)
MCH: 27.5 pg (ref 26.0–34.0)
MCHC: 29.3 g/dL — AB (ref 30.0–36.0)
MCV: 93.8 fL (ref 78.0–100.0)
PLATELETS: 352 10*3/uL (ref 150–400)
RBC: 4.04 MIL/uL — ABNORMAL LOW (ref 4.22–5.81)
RDW: 16 % — AB (ref 11.5–15.5)
WBC: 14 10*3/uL — AB (ref 4.0–10.5)

## 2015-06-03 LAB — GLUCOSE, CAPILLARY
GLUCOSE-CAPILLARY: 172 mg/dL — AB (ref 65–99)
GLUCOSE-CAPILLARY: 109 mg/dL — AB (ref 65–99)
Glucose-Capillary: 118 mg/dL — ABNORMAL HIGH (ref 65–99)
Glucose-Capillary: 106 mg/dL — ABNORMAL HIGH (ref 65–99)

## 2015-06-03 MED ORDER — ATORVASTATIN CALCIUM 40 MG PO TABS
40.0000 mg | ORAL_TABLET | Freq: Every day | ORAL | Status: DC
  Administered 2015-06-03 – 2015-06-05 (×3): 40 mg via ORAL
  Filled 2015-06-03 (×3): qty 1

## 2015-06-03 MED ORDER — TIOTROPIUM BROMIDE MONOHYDRATE 18 MCG IN CAPS
18.0000 ug | ORAL_CAPSULE | Freq: Every day | RESPIRATORY_TRACT | Status: DC
  Administered 2015-06-04 – 2015-06-06 (×3): 18 ug via RESPIRATORY_TRACT
  Filled 2015-06-03: qty 5

## 2015-06-03 MED ORDER — BISOPROLOL FUMARATE 5 MG PO TABS
2.5000 mg | ORAL_TABLET | Freq: Every day | ORAL | Status: DC
  Administered 2015-06-03 – 2015-06-06 (×4): 2.5 mg via ORAL
  Filled 2015-06-03: qty 1
  Filled 2015-06-03 (×3): qty 0.5

## 2015-06-03 MED ORDER — FLUTICASONE PROPIONATE 50 MCG/ACT NA SUSP
2.0000 | Freq: Every day | NASAL | Status: DC
  Administered 2015-06-04 – 2015-06-06 (×3): 2 via NASAL
  Filled 2015-06-03 (×2): qty 16

## 2015-06-03 MED ORDER — MOMETASONE FURO-FORMOTEROL FUM 200-5 MCG/ACT IN AERO
2.0000 | INHALATION_SPRAY | Freq: Two times a day (BID) | RESPIRATORY_TRACT | Status: DC
  Administered 2015-06-04 – 2015-06-06 (×5): 2 via RESPIRATORY_TRACT
  Filled 2015-06-03: qty 8.8

## 2015-06-03 MED ORDER — ATORVASTATIN CALCIUM 40 MG PO TABS: 40.0000 mg | ORAL_TABLET | Freq: Every day | ORAL | Status: DC

## 2015-06-03 MED ORDER — CHLORHEXIDINE GLUCONATE 0.12 % MT SOLN
15.0000 mL | Freq: Two times a day (BID) | OROMUCOSAL | Status: DC
  Administered 2015-06-04 – 2015-06-06 (×6): 15 mL via OROMUCOSAL
  Filled 2015-06-03 (×6): qty 15

## 2015-06-03 MED ORDER — LOSARTAN POTASSIUM 50 MG PO TABS
50.0000 mg | ORAL_TABLET | Freq: Every day | ORAL | Status: DC
  Administered 2015-06-04 – 2015-06-06 (×3): 50 mg via ORAL
  Filled 2015-06-03 (×3): qty 1

## 2015-06-03 MED ORDER — PREDNISONE 20 MG PO TABS
20.0000 mg | ORAL_TABLET | Freq: Two times a day (BID) | ORAL | Status: DC
  Administered 2015-06-04 – 2015-06-06 (×5): 20 mg via ORAL
  Filled 2015-06-03 (×5): qty 1

## 2015-06-03 MED ORDER — ROFLUMILAST 500 MCG PO TABS
500.0000 ug | ORAL_TABLET | Freq: Every day | ORAL | Status: DC
  Administered 2015-06-03 – 2015-06-06 (×4): 500 ug via ORAL
  Filled 2015-06-03 (×4): qty 1

## 2015-06-03 NOTE — Progress Notes (Signed)
Physical Therapy Treatment Patient Details Name: Benjamin [name] MRN: [id number] DOB: [birth year] Today's Date: 06/03/2015    History of Present Illness Pt is a 60 y/o M who presented to [facility] 4/27 w/ SOB.  Pt w/ worsening respiratory acidosis and lethargy despite BiPap and was intubated 4/27-5/2.  Pt's PMH includes COPD, CAD, A-fib, combined CHF, STEMI 10/2012.    PT Comments    Pt admitted with above diagnosis. Pt currently with functional limitations due to endurance and strength deficits. Pt was able to sit EOB for 10 min and perform exercises but too fatigued to do more than that.  Will continuet to progress pt as pt tolerates.  Pt will benefit from skilled PT to increase their independence and safety with mobility to allow discharge to the venue listed below.    Follow Up Recommendations  CIR;Supervision for mobility/OOB     Equipment Recommendations  Rolling walker with 5" wheels    Recommendations for Other Services Other (comment);OT consult;Rehab consult (Cardiopulmonary Rehab)     Precautions / Restrictions Precautions Precautions: Fall Precaution Comments: watch O2 Restrictions Weight Bearing Restrictions: No    Mobility  Bed Mobility Overal bed mobility: Needs Assistance Bed Mobility: Supine to Sit     Supine to sit: Min assist;HOB elevated     General bed mobility comments: Pt uses bed rail w/ HOB elevated and requires min assist reaching for therapist's hand to pull up to sitting.  Cues to scoot to EOB once sitting.  Increased time and effort.  SpO2 89% on 5L O2.  Transfers                 General transfer comment: Refused to stand. Too fatigued  Ambulation/Gait             General Gait Details: Not able to assess at this time   Stairs            Wheelchair Mobility    Modified Rankin (Stroke Patients Only)       Balance Overall balance assessment: Needs assistance Sitting-balance support: No upper extremity  supported;Feet supported Sitting balance-Leahy Scale: Fair Sitting balance - Comments: Did not need UE support to maintain balance.  Sat EOB 10 min                            Cognition Arousal/Alertness: Awake/alert Behavior During Therapy: Flat affect Overall Cognitive Status: Within Functional Limits for tasks assessed                      Exercises General Exercises - Upper Extremity Shoulder Flexion: AROM;Both;10 reps;Seated Elbow Flexion: AROM;Both;10 reps;Seated General Exercises - Lower Extremity Ankle Circles/Pumps: AROM;Both;10 reps;Seated Long Arc Quad: AROM;Both;10 reps;Seated Hip Flexion/Marching: AROM;Both;10 reps;Seated    General Comments        Pertinent Vitals/Pain Pain Assessment: No/denies pain  89-94% on 5LO2 sitting EOB.  Other VSS.     Home Living                      Prior Function            PT Goals (current goals can now be found in the care plan section) Progress towards PT goals: Progressing toward goals    Frequency  Min 3X/week    PT Plan Current plan remains appropriate    Co-evaluation             End of Session  Equipment Utilized During Treatment: Oxygen Activity Tolerance: Patient limited by fatigue Patient left: with call bell/phone within reach;in bed;with family/visitor present     Time: [id number] PT Time Calculation (min) (ACUTE ONLY): 12 min  Charges:  $Therapeutic Exercise: 8-22 mins                    G CodesIrwin Brakeman F 06-14-2015, 4:00 PM [place] Juliani [place],PT Acute Rehabilitation [phone] (pager)

## 2015-06-03 NOTE — NC FL2 (Signed)
[place] LEVEL OF CARE SCREENING TOOL     IDENTIFICATION  Patient Name: Benjamin [name] Birthdate: [birth year] Sex: male Admission Date (Current Location): 05/26/2015  [facility] and [place] Number:  Herbalist and Address:  [facility] [place] [place] [address], [place], [place] [address]      Provider Number: [id number]  Attending Physician Name and Address:  Cherene Altes, MD  Relative Name and Phone Number:  Isidoro Donning, [phone]    Current Level of Care: Hospital Recommended Level of Care: [place] Prior Approval Number:    Date Approved/Denied:   PASRR Number: [id number]  Discharge Plan: SNF    Current Diagnoses: Patient Active Problem List   Diagnosis Date Noted  . Acute respiratory failure ([place])   . Endotracheal tube present   . Sepsis ([place]) 05/26/2015  . HCAP (healthcare-associated pneumonia) 05/26/2015  . Acute respiratory failure with hypoxia ([place]) 05/26/2015  . Acute encephalopathy   . AKI (acute kidney injury) ([place])   . COPD exacerbation ([place]) 01/31/2015  . Obesity (BMI 30.0-34.9) 03/31/2014  . Essential hypertension 11/26/2013  . DOE (dyspnea on exertion) 07/18/2013  . Cardiomyopathy, ischemic - EF 40-45% 12/07/2012  . Former heavy cigarette smoker (20-39 per day) 11/19/2012  . Dyslipidemia, goal LDL below 70 11/19/2012  . Paroxysmal Atrial fibrillation with RVR - peri-MI; recurrent after DCCV 11/19/2012  . Pneumonia- Rt 11/18/2012  . CAD S/P percutaneous coronary angioplasty - PCI mid RCA - 5.0 mm x 24 mm VeriFlex BMS (5.6 mm) 11/18/2012    Class: Hospitalized for  . STEMI of inferior wall 11/18/12- RCA BMS 11/17/2012  . COPD (chronic obstructive pulmonary disease) - Now on Home O2 11/17/2012  . Chronic combined systolic and diastolic heart failure, NYHA class 2 ([place]) 11/17/2012    Orientation RESPIRATION BLADDER Height & Weight     Self, Time, Situation, Place  O2 (Nasal  cannula 5L/min) Continent Weight: 223 lb 1.7 oz (101.2 kg) Height:  5' 11"  (180.3 cm)  BEHAVIORAL SYMPTOMS/MOOD NEUROLOGICAL BOWEL NUTRITION STATUS      Continent Diet (Please see DC summary)  AMBULATORY STATUS COMMUNICATION OF NEEDS Skin   Extensive Assist Verbally Normal                       Personal Care Assistance Level of Assistance  Bathing, Feeding, Dressing Bathing Assistance: Maximum assistance Feeding assistance: Limited assistance Dressing Assistance: Limited assistance     Functional Limitations Info             SPECIAL CARE FACTORS FREQUENCY  PT (By licensed PT)     PT Frequency: min 3x/week              Contractures      Additional Factors Info  Code Status, Allergies, Insulin Sliding Scale Code Status Info: Full Allergies Info: Codeine, Penicillins   Insulin Sliding Scale Info: insulin aspart (novoLOG) injection 0-15 Units       Current Medications (06/03/2015):  This is the current hospital active medication list Current Facility-Administered Medications  Medication Dose Route Frequency Provider Last Rate Last Dose  . ALPRAZolam Duanne Moron) tablet 0.25 mg  0.25 mg Oral Q6H PRN Anders Simmonds, MD   0.25 mg at 06/01/15 2213  . apixaban (ELIQUIS) tablet 5 mg  5 mg Oral BID Myrene Galas, RPH   5 mg at 06/02/15 2110  . atorvastatin (LIPITOR) tablet 40 mg  40 mg Per Tube q1800 Rahul Dianna Rossetti,  PA-C   40 mg at 06/02/15 1734  . budesonide (PULMICORT) nebulizer solution 0.5 mg  0.5 mg Nebulization BID Rahul P Desai, PA-C   0.5 mg at 06/03/15 0728  . chlorhexidine gluconate (SAGE KIT) (PERIDEX) 0.12 % solution 15 mL  15 mL Mouth Rinse BID Jose Shirl Harris, MD   15 mL at 06/02/15 2000  . clopidogrel (PLAVIX) tablet 75 mg  75 mg Oral Daily [place], MD   75 mg at 06/02/15 4008  . feeding supplement (ENSURE ENLIVE) (ENSURE ENLIVE) liquid 237 mL  237 mL Oral BID BM Rush Farmer, MD   237 mL at 06/02/15 1447  . fentaNYL (SUBLIMAZE) bolus  via infusion 25-50 mcg  25-50 mcg Intravenous Q30 min PRN Javier Glazier, MD   50 mcg at 05/28/15 1743  . insulin aspart (novoLOG) injection 0-15 Units  0-15 Units Subcutaneous TID WC Cherene Altes, MD   0 Units at 06/02/15 1200  . ipratropium (ATROVENT) nebulizer solution 0.5 mg  0.5 mg Nebulization Q6H Cherene Altes, MD   0.5 mg at 06/03/15 6761  . levalbuterol (XOPENEX) nebulizer solution 0.63 mg  0.63 mg Nebulization Q3H PRN Cherene Altes, MD      . levalbuterol [facility]) nebulizer solution 0.63 mg  0.63 mg Nebulization Q6H Cherene Altes, MD   0.63 mg at 06/03/15 9509  . levofloxacin (LEVAQUIN) tablet 750 mg  750 mg Oral Daily Myrene Galas, RPH   750 mg at 06/02/15 3267  . methylPREDNISolone sodium succinate (SOLU-MEDROL) 40 mg/mL injection 40 mg  40 mg Intravenous Q12H Raylene Miyamoto, MD   40 mg at 06/03/15 0330  . multivitamin with minerals tablet 1 tablet  1 tablet Per Tube Daily Jose Shirl Harris, MD   1 tablet at 06/02/15 1734  . pantoprazole (PROTONIX) EC tablet 40 mg  40 mg Oral QHS Myrene Galas, RPH   40 mg at 06/02/15 2110  . polyethylene glycol (MIRALAX / GLYCOLAX) packet 17 g  17 g Oral Daily Cherene Altes, MD   17 g at 06/02/15 1156  . roflumilast (DALIRESP) tablet 500 mcg  500 mcg Oral Daily Collene Gobble, MD      . senna-docusate (Senokot-S) tablet 1 tablet  1 tablet Oral BID Cherene Altes, MD   1 tablet at 06/02/15 2110  . sodium chloride (OCEAN) 0.65 % nasal spray 1 spray  1 spray Each Nare PRN Javier Glazier, MD   1 spray at 06/01/15 1102     Discharge Medications: Please see discharge summary for a list of discharge medications.  Relevant Imaging Results:  Relevant Lab Results:   Additional Information SSN: [place], [place]

## 2015-06-03 NOTE — Progress Notes (Signed)
[place] TEAM 1 - Stepdown/ICU TEAM PROGRESS NOTE  RAFIK KOPPEL ZCH:[id number] DOB: January 20, 1956 DOA: 05/26/2015 PCP: [name], MD  Admit HPI / Brief Narrative: 60 y/o male with hx COPD, CAD, AFib, and combined CHF who presented to [facility] ER 4/27 with 2 day hx SOB. He initially sought care at his PCP's office who sent him urgently to the ER with respiratory distress. Pt with worsening respiratory acidosis and lethargy despite BIPAP in ER. He was intubated and tx to [facility] with AECOPD and probable HCAP. Had admission 01/31/15 - 02/02/15 for AECOPD, discharged on azithro and prednisone taper.  Significant Events: 4/27 admit, possible HCAP - failed BIPAP - intubated 5/1 bronchoscopy  5/2 extubated   HPI/Subjective: Pt is breathing much more comfortably today.  He has not yet been up and out of bed to a signif extent.  He c/o nasal congestion.  He denies cp, n/v, or abdom pain.    Assessment/Plan:  Acute on chronic hypoxic / hypercarbic respiratory failure due to acute COPD exacerbation and H flu Pneumonia  on 4L O2 at baseline - sats stable at present on 5L [place] - signif decrease in wheezing today - beginning weaning steroids further and gradually transition back to home inhaler regimen  Chronic Combined Systolic and Diastolic CHF TTE 2/87/86 w/ EF 40-45%, grade 1DD - TTE 05/28/15 w/ EF 55-60% - no evidence of significant volume overload  Filed Weights   06/01/15 0324 06/02/15 0500 06/03/15 0330  Weight: 102 kg (224 lb 13.9 oz) 101 kg (222 lb 10.6 oz) 101.2 kg (223 lb 1.7 oz)    CAD Continue outpatient plavix, atorvastatin - denies chest pain - resume BB as wheezing has improved today   Chronic AFib Cont eliquis - NSR at present   AKI Resolved   Recent Labs     06/01/15  0452  06/02/15  0232  06/03/15  0330  CREATININE  0.73  0.80  0.87    Anemia - mild Hgb stable   PCN allergy  Hyperglycemia no hx DM - CBG much improved - taper steroids asap - A1c  pending   AMS Resolved   Code Status: FULL but NO trach/peg/SNF or chronic vent Family Communication: no family present at time of exam Disposition Plan: stable for transfer to tele bed - PT/OT - taper meds   Consultants: PCCM  Antibiotics: Vanc 4/27 > 5/2 Aztreonam 4/27 > 5/2 Levofloxacin 4/28 > 5/5  DVT prophylaxis: eliquis   Objective: Blood pressure 137/85, pulse 102, temperature 98.4 F (36.9 C), temperature source Oral, resp. rate 18, height 5' 11"  (1.803 m), weight 101.2 kg (223 lb 1.7 oz), SpO2 92 %.  Intake/Output Summary (Last 24 hours) at 06/03/15 1451 Last data filed at 06/03/15 1100  Gross per 24 hour  Intake      0 ml  Output    900 ml  Net   -900 ml    Exam: General: no acute resp distress  Lungs: less wheezing today - improved air movement th/o  Cardiovascular: Regular rate and rhythm without murmur  Abdomen: Nontender, nondistended, soft, bowel sounds positive, no rebound, no ascites, no appreciable mass Extremities: No significant cyanosis, or clubbing;  trace edema bilateral lower extremities  Data Reviewed: Basic Metabolic Panel:  Recent Labs Lab 05/29/15 0500 05/30/15 0212 05/31/15 0217 06/01/15 0452 06/02/15 0232 06/03/15 0330  NA 142 144 147* 142 139 143  K 4.4 5.2* 4.7 4.7 5.3* 4.6  CL 100* 98* 96* 97* 95* 94*  CO2  31 36* 38* 35* 32 34*  GLUCOSE 272* 211* 205* 121* 134* 105*  BUN 43* 56* 65* 46* 41* 38*  CREATININE 0.83 0.92 0.89 0.73 0.80 0.87  CALCIUM 8.6* 8.6* 9.3 9.0 9.2 9.5  MG 2.9* 2.9* 2.9* 2.6* 2.5*  --   PHOS 2.7 3.1 3.4 4.3 5.8*  --     CBC:  Recent Labs Lab 05/30/15 0212 05/31/15 0217 06/01/15 0452 06/02/15 0232 06/03/15 0330  WBC 22.8* 25.6* 28.3* 22.2* 14.0*  HGB 8.8* 9.8* 10.3* 10.8* 11.1*  HCT 29.7* 33.9* 35.0* 36.3* 37.9*  MCV 97.1 96.0 96.2 94.5 93.8  PLT 392 410* 383 416* 352    Liver Function Tests:  Recent Labs Lab 06/03/15 0330  AST 17  ALT 18  ALKPHOS 52  BILITOT 0.7  PROT 6.9    ALBUMIN 2.6*    Recent Labs Lab 05/29/15 1937 05/30/15 0440 05/30/15 1846 05/31/15 0219 06/01/15 0452  APTT 101* 108* 88* 76* 45*    CBG:  Recent Labs Lab 06/02/15 1225 06/02/15 1638 06/02/15 2036 06/03/15 0823 06/03/15 1146  GLUCAP 109* 112* 115* 118* 172*    Recent Results (from the past 240 hour(s))  Urine culture     Status: None   Collection Time: 05/26/15  2:00 PM  Result Value Ref Range Status   Specimen Description URINE, CATHETERIZED  Final   Special Requests NONE  Final   Culture   Final    NO GROWTH 2 DAYS Performed at [facility]    Report Status 05/28/2015 FINAL  Final  MRSA PCR Screening     Status: None   Collection Time: 05/26/15  7:34 PM  Result Value Ref Range Status   MRSA by PCR NEGATIVE NEGATIVE Final    Comment:        The GeneXpert MRSA Assay (FDA approved for NASAL specimens only), is one component of a comprehensive MRSA colonization surveillance program. It is not intended to diagnose MRSA infection nor to guide or monitor treatment for MRSA infections.   Culture, respiratory (NON-Expectorated)     Status: None   Collection Time: 05/26/15  8:00 PM  Result Value Ref Range Status   Specimen Description TRACHEAL ASPIRATE  Final   Special Requests Normal  Final   Gram Stain   Final    FEW WBC PRESENT,BOTH PMN AND MONONUCLEAR NO SQUAMOUS EPITHELIAL CELLS SEEN FEW GRAM NEGATIVE COCCOBACILLI Performed at [facility]    Culture   Final    FEW HAEMOPHILUS INFLUENZAE Note: BETA LACTAMASE POSITIVE Performed at [facility]    Report Status 05/30/2015 FINAL  Final  Urine culture     Status: None   Collection Time: 05/26/15  8:24 PM  Result Value Ref Range Status   Specimen Description URINE, CATHETERIZED  Final   Special Requests Normal  Final   Culture NO GROWTH 2 DAYS  Final   Report Status 05/28/2015 FINAL  Final  Culture, blood (Routine X 2) w Reflex to ID Panel     Status: None   Collection Time:  05/26/15  9:02 PM  Result Value Ref Range Status   Specimen Description BLOOD LEFT HAND  Final   Special Requests IN PEDIATRIC BOTTLE 3CC  Final   Culture NO GROWTH 5 DAYS  Final   Report Status 05/31/2015 FINAL  Final  Culture, blood (Routine X 2) w Reflex to ID Panel     Status: None   Collection Time: 05/26/15  9:09 PM  Result Value Ref Range  Status   Specimen Description BLOOD LEFT ANTECUBITAL  Final   Special Requests BOTTLES DRAWN AEROBIC AND ANAEROBIC 5CC   Final   Culture NO GROWTH 5 DAYS  Final   Report Status 05/31/2015 FINAL  Final  Culture, bal-quantitative     Status: None   Collection Time: 05/30/15 10:35 AM  Result Value Ref Range Status   Specimen Description BRONCHIAL ALVEOLAR LAVAGE  Final   Special Requests Normal  Final   Gram Stain   Final    ABUNDANT WBC PRESENT,BOTH PMN AND MONONUCLEAR NO SQUAMOUS EPITHELIAL CELLS SEEN NO ORGANISMS SEEN Performed at [facility]    Culture   Final    NO GROWTH 2 DAYS Performed at [facility]    Report Status 06/02/2015 FINAL  Final  Pneumocystis smear by DFA     Status: None   Collection Time: 05/30/15 10:35 AM  Result Value Ref Range Status   Specimen Source-[facility] BRONCHIAL ALVEOLAR LAVAGE  Final   Pneumocystis jiroveci Ag NEGATIVE  Final    Comment: PERFORMED AT [facility]     Studies:   Recent x-ray studies have been reviewed in detail by the Attending Physician  Scheduled Meds:  Scheduled Meds: . apixaban  5 mg Oral BID  . atorvastatin  40 mg Per Tube q1800  . budesonide (PULMICORT) nebulizer solution  0.5 mg Nebulization BID  . chlorhexidine gluconate (SAGE KIT)  15 mL Mouth Rinse BID  . clopidogrel  75 mg Oral Daily  . feeding supplement (ENSURE ENLIVE)  237 mL Oral BID BM  . insulin aspart  0-15 Units Subcutaneous TID WC  . ipratropium  0.5 mg Nebulization Q6H  . levalbuterol  0.63 mg Nebulization Q6H  . levofloxacin  750 mg Oral Daily  . methylPREDNISolone  (SOLU-MEDROL) injection  40 mg Intravenous Q12H  . multivitamin with minerals  1 tablet Per Tube Daily  . pantoprazole  40 mg Oral QHS  . polyethylene glycol  17 g Oral Daily  . roflumilast  500 mcg Oral Daily  . senna-docusate  1 tablet Oral BID    Time spent on care of this patient: 35 mins   MCCLUNG,JEFFREY T , MD   [facility] Office  [phone] Pager - Text Page per Shea Evans as per below:  On-Call/Text Page:      [email or web address]      password [email or web address]  If 7PM-7AM, please contact night-coverage [email or web address] Password [email or web address] 06/03/2015, 2:51 PM   LOS: 8 days

## 2015-06-03 NOTE — Plan of Care (Signed)
Problem: Activity: Goal: Risk for activity intolerance will decrease Outcome: Progressing Pt is extremely weak and deconditioned, but already able to move around in the bed by himself a lot better than 2 days ago.  Pt will require rehab before going home.

## 2015-06-03 NOTE — Progress Notes (Signed)
Patient to transfer to 5W.  Report called to [place].

## 2015-06-03 NOTE — Progress Notes (Signed)
PULMONARY / CRITICAL CARE MEDICINE   Name: Benjamin [name] MRN: [id number] DOB: 1955/08/17    ADMISSION DATE:  05/26/2015  REFERRING MD:  Forestine Na   CHIEF COMPLAINT:  Respiratory failure   HISTORY OF PRESENT ILLNESS:   60 y/o male with hx COPD, CAD, AFib, combined CHF presented to [facility] ER 4/27 with 2 day hx SOB.  He initially sought care at his PCP's office who sent him urgently to ER with respiratory distress.  Pt with worsening respiratory acidosis and lethargy despite bipap attempts in ER.  He was intubated and tx to [facility] with AECOPD and probable HCAP.  Had recent admission 01/31/15 - 02/02/15 for AECOPD, discharged on azithro and prednisone taper.  SUBJECTIVE:   Feeling a bit better, comfortable in bed  VITAL SIGNS: BP 128/99 mmHg  Pulse 82  Temp(Src) 98.7 F (37.1 C) (Oral)  Resp 22  Ht 5\' 11"  (1.803 m)  Wt 101.2 kg (223 lb 1.7 oz)  BMI 31.13 kg/m2  SpO2 93%       INTAKE / OUTPUT: I/O last 3 completed shifts: In: -  Out: 2025 [Urine:2025]  PHYSICAL EXAMINATION: General: obese male in NAD, lying in bed, awake and interactive.  Neuro: Alert and interactive, moving all ext to command. HEENT: [place]/AT. PERRL, sclerae anicteric. Cardiovascular: RRR, no M/R/G.  Lungs: Respiratory rate even non labored, low pitched soft exp wheezes B  Abdomen: BSx4 hypoactive, large abd  Musculoskeletal: No gross deformities, no edema.  Skin: Intact, warm, no rashes.  LABS:  BMET  Recent Labs Lab 06/01/15 0452 06/02/15 0232 06/03/15 0330  NA 142 139 143  K 4.7 5.3* 4.6  CL 97* 95* 94*  CO2 35* 32 34*  BUN 46* 41* 38*  CREATININE 0.73 0.80 0.87  GLUCOSE 121* 134* 105*   Electrolytes  Recent Labs Lab 05/31/15 0217 06/01/15 0452 06/02/15 0232 06/03/15 0330  CALCIUM 9.3 9.0 9.2 9.5  MG 2.9* 2.6* 2.5*  --   PHOS 3.4 4.3 5.8*  --    CBC  Recent Labs Lab 06/01/15 0452 06/02/15 0232 06/03/15 0330  WBC 28.3* 22.2* 14.0*  HGB 10.3* 10.8* 11.1*  HCT 35.0*  36.3* 37.9*  PLT 383 416* 352    Coag's  Recent Labs Lab 05/30/15 1846 05/31/15 0219 06/01/15 0452  APTT 88* 76* 45*    Sepsis Markers  Recent Labs Lab 05/28/15 0325  PROCALCITON 1.73    ABG  Recent Labs Lab 05/28/15 1543 05/31/15 0324  PHART 7.342* 7.374  PCO2ART 66.6* 74.6*  PO2ART 77.0* 97.6    Liver Enzymes  Recent Labs Lab 06/03/15 0330  AST 17  ALT 18  ALKPHOS 52  BILITOT 0.7  ALBUMIN 2.6*    Cardiac Enzymes  Recent Labs Lab 05/27/15 1304  TROPONINI 0.06*    Glucose  Recent Labs Lab 06/02/15 0007 06/02/15 0402 06/02/15 0806 06/02/15 1225 06/02/15 1638 06/02/15 2036  GLUCAP 112* 129* 106* 109* 112* 115*    Imaging Dg Chest Port 1 View  06/03/2015  CLINICAL DATA:  Healthcare associated pneumonia EXAM: PORTABLE CHEST 1 VIEW COMPARISON:  05/31/2015 FINDINGS: There is hyperinflation of the lungs compatible with COPD. Improving aeration in the lung bases. Minimal residual bibasilar opacities. Mild peribronchial thickening. Heart is normal size. No visible effusions. IMPRESSION: Improving aeration in the lung bases with minimal residual atelectasis or infiltrate. COPD.  Mild bronchitic changes. Electronically Signed   By: Rolm Baptise M.D.   On: 06/03/2015 07:15     STUDIES:  CXR 4/27 >  emphysematous changes. CXR 5/5 >> improving B infiltrates  CULTURES: MRSA PCR 4/28>> negative Sputum 4/27 >> H flu, b-lactamase positive BC x 2 4/27 >> negative Urine 4/29 > negative BAL 5/4>>>No growth MRSA PCR 4/28>> negative  ANTIBIOTICS: Vanc 4/27>>>5/3 Aztreonam 4/27>>>5/3 Levofloxacin 4/28>>>5/5  SIGNIFICANT EVENTS: 4/27- Admit, possible HCAP, ett placed  LINES/TUBES: ETT 4/27>>5/2  I reviewed CXR myself, hyperinflation noted.  DISCUSSION: 60 y.o. M admitted with AECOPD and possible HCAP, failed BiPAP and required intubation. H flu PNA (note b-lactamase positive)  ASSESSMENT / PLAN:  PULMONARY A:  Acute on chronic hypoxic /  hypercarbic respiratory failure - followed by Dr. Luan Pulling, on O2 at baseline AECOPD. HCAP > H flu P:   Titrate O2 for sat of 88-92%. Recommend transition to prednisone 5/5 and plan taper Will need pulmonary rehab upon discharge. DuoNebs / Budesonide / Albuterol. To home regimen of spiriva + symbicort soon Restart hole Daliresp today 5/5 Intermittent CXR.  CARDIOVASCULAR A: Combined CHF - last echo from 04/13/13 with EF 40-45%, grade 1DD. ECHO>>>EF 55-60% and no tamponade. Hx CAD, AFib. P:  Continue eliquis  Continue outpatient plavix, atorvastatin. Agree with restart bisoprolol, lasix, losartan soon; BP and renal fxn should tolerate.  INFECTIOUS A: H flu CAP - last hospitalization in Jan 2017 PCN allergy P:   8 days abx completed on 5/5  NEUROLOGIC A: Hx anxiety P:   PRN Alprazolam q6hr prn for anxiety Hold outpatient gabapentin, restart per [facility] plans.  FAMILY  Updates:  No family at bedside 5/5.  Inter-disciplinary family meet or Palliative Care meeting due by:  06/01/15.   Baltazar Apo, MD, PhD 06/03/2015, 8:46 AM [facility] [phone] or if no answer [phone]

## 2015-06-03 NOTE — Plan of Care (Signed)
Problem: Physical Regulation: Goal: Will remain free from infection Outcome: Progressing Pts WBC has been consistently decreasing, VS's WNL, afebrile, continues to take oral antibiotics.  Pt still has productive cough, very congested, awaiting CXR results.

## 2015-06-03 NOTE — Clinical Social Work Placement (Signed)
   CLINICAL SOCIAL WORK PLACEMENT  NOTE  Date:  06/03/2015  Patient Details  Name: ROWLAND SOONG MRN: [id number] Date of Birth: 02-06-1955  Clinical Social Work is seeking post-discharge placement for this patient at the [place] level of care (*CSW will initial, date and re-position this form in  chart as items are completed):      Patient/family provided with [place] Work Department's list of facilities offering this level of care within the geographic area requested by the patient (or if unable, by the patient's family).      Patient/family informed of their freedom to choose among providers that offer the needed level of care, that participate in Medicare, Medicaid or managed care program needed by the patient, have an available bed and are willing to accept the patient.      Patient/family informed of [place]'s ownership interest in [facility] and [facility], as well as of the fact that they are under no obligation to receive care at these facilities.  PASRR submitted to EDS on 06/03/15     PASRR number received on 06/03/15     Existing PASRR number confirmed on       FL2 transmitted to all facilities in geographic area requested by pt/family on 06/03/15     FL2 transmitted to all facilities within larger geographic area on       Patient informed that his/her managed care company has contracts with or will negotiate with certain facilities, including the following:            Patient/family informed of bed offers received.  Patient chooses bed at       Physician recommends and patient chooses bed at      Patient to be transferred to   on  .  Patient to be transferred to facility by       Patient family notified on   of transfer.  Name of family member notified:        PHYSICIAN Please sign FL2     Additional Comment:    _______________________________________________ Benard Halsted, [place] 06/03/2015, 8:53  AM

## 2015-06-04 DIAGNOSIS — J9621 Acute and chronic respiratory failure with hypoxia: Secondary | ICD-10-CM | POA: Diagnosis present

## 2015-06-04 LAB — HEMOGLOBIN A1C
HEMOGLOBIN A1C: 6.9 % — AB (ref 4.8–5.6)
MEAN PLASMA GLUCOSE: 151 mg/dL

## 2015-06-04 LAB — GLUCOSE, CAPILLARY
GLUCOSE-CAPILLARY: 101 mg/dL — AB (ref 65–99)
GLUCOSE-CAPILLARY: 120 mg/dL — AB (ref 65–99)
GLUCOSE-CAPILLARY: 151 mg/dL — AB (ref 65–99)
Glucose-Capillary: 92 mg/dL (ref 65–99)

## 2015-06-04 MED ORDER — ACETAMINOPHEN 325 MG PO TABS: 650.0000 mg | ORAL_TABLET | Freq: Four times a day (QID) | ORAL | Status: DC | PRN

## 2015-06-04 NOTE — Progress Notes (Addendum)
PROGRESS NOTE  Benjamin [name]  [id number] DOB: 1955-10-21  DOA: 05/26/2015 PCP: [name], MD  Outpatient Specialists:  Pulmonology: Dr. Sinda Du Cardiology: Dr. Glenetta Hew  Brief Narrative:  60 y/o male with hx COPD, CAD, AFib, and combined CHF who presented to [facility] ER 4/27 with 2 day hx SOB. He initially sought care at his PCP's office who sent him urgently to the ER with respiratory distress. Pt with worsening respiratory acidosis and lethargy despite BIPAP in ER. He was intubated and tx to [facility] with AECOPD and probable HCAP. Had admission 01/31/15 - 02/02/15 for AECOPD, discharged on azithro and prednisone taper.   Assessment & Plan:   Principal Problem:   Acute on chronic respiratory failure with hypoxia (HCC) Active Problems:   HCAP (healthcare-associated pneumonia)   Acute encephalopathy   AKI (acute kidney injury) ([place])   Acute on chronic hypoxic and hypercapnic respiratory failure - Attributed to COPD exacerbation and H. influenzae pneumonia - Failed BiPAP in ED. Admitted to ICU. He required ventilatory support. Extubated 5/2. - Patient on home oxygen 4 L/m at baseline. - Improved.  COPD exacerbation - Improving. Taper steroids. Continue oxygen, Pulmicort, Flonase, Dulera, Spiriva, Reflumilast.  Chronic diastolic CHF - TTE [id number]: LVEF 55-60 percent. Previous echo 2015 had LVEF 40-45 percent which has now normalized. - Compensated.  CAD - Continue Plavix, statin and resumed bisoprolol.  Paroxysmal atrial fibrillation - Continue bisoprolol and Eliquis. Telemetry: Sinus rhythm today.  Acute kidney injury - Resolved.  Anemia - Stable  Penicillin allergy  Type II DM - Hemoglobin A1c 6.9. SSI. Good control.  Acute encephalopathy - Resolved.   DVT prophylaxis: on Eliquis Code Status: Full but NO trach/PEG/SNF or chronic Vent Family Communication: None at bedside. Disposition Plan: DC home in the next 48  hours.   Consultants:   CCM  Procedures:   BiPAP in ED  5/1 bronchoscopy  5/2 extubated  Antimicrobials:  Vanc 4/27 > 5/2 Aztreonam 4/27 > 5/2 Levofloxacin 4/28 > 5/5   Subjective: States that he feels much better than on admission. He states that he cannot remember events of the first 2 days of hospitalization. Dyspnea progressively improving-mostly exertional. No dyspnea at rest. Denies any other complaints.  Objective:  Filed Vitals:   06/04/15 0403 06/04/15 0500 06/04/15 0830 06/04/15 1407  BP: 116/80   97/59  Pulse: 72   64  Temp: 98 F (36.7 C)   98.3 F (36.8 C)  TempSrc: Oral   Oral  Resp: 20   19  Height:      Weight:  91.6 kg (201 lb 15.1 oz)    SpO2: 96%  95% 96%    Intake/Output Summary (Last 24 hours) at 06/04/15 1459 Last data filed at 06/04/15 1300  Gross per 24 hour  Intake    200 ml  Output    451 ml  Net   -251 ml   Filed Weights   06/03/15 0330 06/03/15 2001 06/04/15 0500  Weight: 101.2 kg (223 lb 1.7 oz) 97.6 kg (215 lb 2.7 oz) 91.6 kg (201 lb 15.1 oz)    Examination:  General exam: Pleasant middle-aged male sitting up comfortably in the chair this morning. Appears calm and comfortable  Respiratory system: Fair breath sounds bilaterally. Occasional rhonchi anteriorly. Respiratory effort normal. Cardiovascular system: S1 & S2 heard, RRR.[name] No JVD, murmurs, rubs, gallops or clicks. No pedal edema. Telemetry: Sinus rhythm. Gastrointestinal system: Abdomen is nondistended, soft and nontender. No organomegaly or masses felt. Normal bowel sounds  heard. Central nervous system: Alert and oriented. No focal neurological deficits. Extremities: Symmetric 5 x 5 power. Skin: No rashes, lesions or ulcers Psychiatry: Judgement and insight appear normal. Mood & affect appropriate.     Data Reviewed: I have personally reviewed following labs and imaging studies  CBC:  Recent Labs Lab 05/30/15 0212 05/31/15 0217 06/01/15 0452 06/02/15 0232  06/03/15 0330  WBC 22.8* 25.6* 28.3* 22.2* 14.0*  HGB 8.8* 9.8* 10.3* 10.8* 11.1*  HCT 29.7* 33.9* 35.0* 36.3* 37.9*  MCV 97.1 96.0 96.2 94.5 93.8  PLT 392 410* 383 416* [id number]   Basic Metabolic Panel:  Recent Labs Lab 05/29/15 0500 05/30/15 0212 05/31/15 0217 06/01/15 0452 06/02/15 0232 06/03/15 0330  NA 142 144 147* 142 139 143  K 4.4 5.2* 4.7 4.7 5.3* 4.6  CL 100* 98* 96* 97* 95* 94*  CO2 31 36* 38* 35* 32 34*  GLUCOSE 272* 211* 205* 121* 134* 105*  BUN 43* 56* 65* 46* 41* 38*  CREATININE 0.83 0.92 0.89 0.73 0.80 0.87  CALCIUM 8.6* 8.6* 9.3 9.0 9.2 9.5  MG 2.9* 2.9* 2.9* 2.6* 2.5*  --   PHOS 2.7 3.1 3.4 4.3 5.8*  --    GFR: Estimated Creatinine Clearance: 105.8 mL/min (by C-G formula based on Cr of 0.87). Liver Function Tests:  Recent Labs Lab 06/03/15 0330  AST 17  ALT 18  ALKPHOS 52  BILITOT 0.7  PROT 6.9  ALBUMIN 2.6*   No results for input(s): LIPASE, AMYLASE in the last 168 hours. No results for input(s): AMMONIA in the last 168 hours. Coagulation Profile: No results for input(s): INR, PROTIME in the last 168 hours. Cardiac Enzymes: No results for input(s): CKTOTAL, CKMB, CKMBINDEX, TROPONINI in the last 168 hours. BNP (last 3 results) No results for input(s): PROBNP in the last 8760 hours. HbA1C:  Recent Labs  06/03/15 0315  HGBA1C 6.9*   CBG:  Recent Labs Lab 06/03/15 1146 06/03/15 1703 06/03/15 2142 06/04/15 0741 06/04/15 1203  GLUCAP 172* 106* 109* 101* 151*   Lipid Profile:  Recent Labs  06/01/15 1957  TRIG 228*   Thyroid Function Tests: No results for input(s): TSH, T4TOTAL, FREET4, T3FREE, THYROIDAB in the last 72 hours. Anemia Panel: No results for input(s): VITAMINB12, FOLATE, FERRITIN, TIBC, IRON, RETICCTPCT in the last 72 hours. Urine analysis:    Component Value Date/Time   COLORURINE YELLOW 05/26/2015 1400   APPEARANCEUR CLEAR 05/26/2015 1400   LABSPEC 1.020 05/26/2015 1400   PHURINE 5.0 05/26/2015 1400    GLUCOSEU NEGATIVE 05/26/2015 1400   HGBUR SMALL* 05/26/2015 1400   BILIRUBINUR NEGATIVE 05/26/2015 1400   KETONESUR NEGATIVE 05/26/2015 1400   PROTEINUR TRACE* 05/26/2015 1400   NITRITE NEGATIVE 05/26/2015 1400   LEUKOCYTESUR NEGATIVE 05/26/2015 1400    Recent Results (from the past 240 hour(s))  Urine culture     Status: None   Collection Time: 05/26/15  2:00 PM  Result Value Ref Range Status   Specimen Description URINE, CATHETERIZED  Final   Special Requests NONE  Final   Culture   Final    NO GROWTH 2 DAYS Performed at [facility]    Report Status 05/28/2015 FINAL  Final  MRSA PCR Screening     Status: None   Collection Time: 05/26/15  7:34 PM  Result Value Ref Range Status   MRSA by PCR NEGATIVE NEGATIVE Final    Comment:        The GeneXpert MRSA Assay (FDA approved for NASAL specimens only), is  one component of a comprehensive MRSA colonization surveillance program. It is not intended to diagnose MRSA infection nor to guide or monitor treatment for MRSA infections.   Culture, respiratory (NON-Expectorated)     Status: None   Collection Time: 05/26/15  8:00 PM  Result Value Ref Range Status   Specimen Description TRACHEAL ASPIRATE  Final   Special Requests Normal  Final   Gram Stain   Final    FEW WBC PRESENT,BOTH PMN AND MONONUCLEAR NO SQUAMOUS EPITHELIAL CELLS SEEN FEW GRAM NEGATIVE COCCOBACILLI Performed at [facility]    Culture   Final    FEW HAEMOPHILUS INFLUENZAE Note: BETA LACTAMASE POSITIVE Performed at [facility]    Report Status 05/30/2015 FINAL  Final  Urine culture     Status: None   Collection Time: 05/26/15  8:24 PM  Result Value Ref Range Status   Specimen Description URINE, CATHETERIZED  Final   Special Requests Normal  Final   Culture NO GROWTH 2 DAYS  Final   Report Status 05/28/2015 FINAL  Final  Culture, blood (Routine X 2) w Reflex to ID Panel     Status: None   Collection Time: 05/26/15  9:02 PM   Result Value Ref Range Status   Specimen Description BLOOD LEFT HAND  Final   Special Requests IN PEDIATRIC BOTTLE 3CC  Final   Culture NO GROWTH 5 DAYS  Final   Report Status 05/31/2015 FINAL  Final  Culture, blood (Routine X 2) w Reflex to ID Panel     Status: None   Collection Time: 05/26/15  9:09 PM  Result Value Ref Range Status   Specimen Description BLOOD LEFT ANTECUBITAL  Final   Special Requests BOTTLES DRAWN AEROBIC AND ANAEROBIC 5CC   Final   Culture NO GROWTH 5 DAYS  Final   Report Status 05/31/2015 FINAL  Final  Culture, bal-quantitative     Status: None   Collection Time: 05/30/15 10:35 AM  Result Value Ref Range Status   Specimen Description BRONCHIAL ALVEOLAR LAVAGE  Final   Special Requests Normal  Final   Gram Stain   Final    ABUNDANT WBC PRESENT,BOTH PMN AND MONONUCLEAR NO SQUAMOUS EPITHELIAL CELLS SEEN NO ORGANISMS SEEN Performed at [facility]    Culture   Final    NO GROWTH 2 DAYS Performed at [facility]    Report Status 06/02/2015 FINAL  Final  Pneumocystis smear by DFA     Status: None   Collection Time: 05/30/15 10:35 AM  Result Value Ref Range Status   Specimen Source-PJSRC BRONCHIAL ALVEOLAR LAVAGE  Final   Pneumocystis jiroveci Ag NEGATIVE  Final    Comment: PERFORMED AT [facility]         Radiology Studies: Dg Chest Port 1 View  06/03/2015  CLINICAL DATA:  Healthcare associated pneumonia EXAM: PORTABLE CHEST 1 VIEW COMPARISON:  05/31/2015 FINDINGS: There is hyperinflation of the lungs compatible with COPD. Improving aeration in the lung bases. Minimal residual bibasilar opacities. Mild peribronchial thickening. Heart is normal size. No visible effusions. IMPRESSION: Improving aeration in the lung bases with minimal residual atelectasis or infiltrate. COPD.  Mild bronchitic changes. Electronically Signed   By: Rolm Baptise M.D.   On: 06/03/2015 07:15        Scheduled Meds: . apixaban  5 mg Oral  BID  . atorvastatin  40 mg Oral q1800  . bisoprolol  2.5 mg Oral Daily  . budesonide (PULMICORT) nebulizer solution  0.5 mg Nebulization BID  . chlorhexidine  15 mL Mouth/Throat BID  . clopidogrel  75 mg Oral Daily  . feeding supplement (ENSURE ENLIVE)  237 mL Oral BID BM  . fluticasone  2 spray Each Nare Daily  . insulin aspart  0-15 Units Subcutaneous TID WC  . losartan  50 mg Oral Daily  . mometasone-formoterol  2 puff Inhalation BID  . multivitamin with minerals  1 tablet Per Tube Daily  . pantoprazole  40 mg Oral QHS  . polyethylene glycol  17 g Oral Daily  . predniSONE  20 mg Oral BID WC  . roflumilast  500 mcg Oral Daily  . senna-docusate  1 tablet Oral BID  . tiotropium  18 mcg Inhalation Daily   Continuous Infusions:    LOS: 9 days    Time spent: 30 minutes.    [facility], MD [facility] Pager [phone]  If 7PM-7AM, please contact night-coverage [email or web address] Password [email or web address] 06/04/2015, 2:59 PM

## 2015-06-04 NOTE — Progress Notes (Signed)
PULMONARY / CRITICAL CARE MEDICINE   Name: Benjamin [name] MRN: [id number] DOB: 1955/07/02    ADMISSION DATE:  05/26/2015  REFERRING MD:  Forestine Na   CHIEF COMPLAINT:  Respiratory failure   HISTORY OF PRESENT ILLNESS:   60 y/o male with hx COPD, CAD, AFib, combined CHF presented to [facility] ER 4/27 with 2 day hx SOB.  He initially sought care at his PCP's office who sent him urgently to ER with respiratory distress.  Pt with worsening respiratory acidosis and lethargy despite bipap attempts in ER.  He was intubated and tx to [facility] with AECOPD and probable HCAP.  Had recent admission 01/31/15 - 02/02/15 for AECOPD, discharged on azithro and prednisone taper.  SUBJECTIVE:   Slowly improving. Still easier than usual dyspnea with minimal exertion/ personal care. He is able to list his home respiratory meds, including O2 4L and nebulizer. He feels these are usually adequate. VITAL SIGNS: BP 116/80 mmHg  Pulse 72  Temp(Src) 98 F (36.7 C) (Oral)  Resp 20  Ht 5\' 11"  (1.803 m)  Wt 201 lb 15.1 oz (91.6 kg)  BMI 28.18 kg/m2  SpO2 96%      INTAKE / OUTPUT: I/O last 3 completed shifts: In: -  Out: 1250 [Urine:1250]  PHYSICAL EXAMINATION: General: obese male in NAD, lying in bed, awake and interactive.  Neuro: Alert and interactive, moving all ext to command. HEENT: [place]/AT. PERRL, sclerae anicteric. Cardiovascular: RRR, no M/R/G.  Lungs: Respiratory rate even non labored, low pitched soft exp wheezes B still noted Abdomen: BSx4 hypoactive, large abd  Musculoskeletal: No gross deformities, no edema.  Skin: Intact, warm, no rashes.  LABS:  BMET  Recent Labs Lab 06/01/15 0452 06/02/15 0232 06/03/15 0330  NA 142 139 143  K 4.7 5.3* 4.6  CL 97* 95* 94*  CO2 35* 32 34*  BUN 46* 41* 38*  CREATININE 0.73 0.80 0.87  GLUCOSE 121* 134* 105*   Electrolytes  Recent Labs Lab 05/31/15 0217 06/01/15 0452 06/02/15 0232 06/03/15 0330  CALCIUM 9.3 9.0 9.2 9.5  MG 2.9* 2.6* 2.5*   --   PHOS 3.4 4.3 5.8*  --    CBC  Recent Labs Lab 06/01/15 0452 06/02/15 0232 06/03/15 0330  WBC 28.3* 22.2* 14.0*  HGB 10.3* 10.8* 11.1*  HCT 35.0* 36.3* 37.9*  PLT 383 416* 352    Coag's  Recent Labs Lab 05/30/15 1846 05/31/15 0219 06/01/15 0452  APTT 88* 76* 45*    Sepsis Markers No results for input(s): LATICACIDVEN, PROCALCITON, O2SATVEN in the last 168 hours.  ABG  Recent Labs Lab 05/28/15 1543 05/31/15 0324  PHART 7.342* 7.374  PCO2ART 66.6* 74.6*  PO2ART 77.0* 97.6    Liver Enzymes  Recent Labs Lab 06/03/15 0330  AST 17  ALT 18  ALKPHOS 52  BILITOT 0.7  ALBUMIN 2.6*    Cardiac Enzymes No results for input(s): TROPONINI, PROBNP in the last 168 hours.  Glucose  Recent Labs Lab 06/02/15 2036 06/03/15 0823 06/03/15 1146 06/03/15 1703 06/03/15 2142 06/04/15 0741  GLUCAP 115* 118* 172* 106* 109* 101*    Imaging No results found.   STUDIES:  CXR 4/27 > emphysematous changes. CXR 5/5 >> improving B infiltrates  CULTURES: MRSA PCR 4/28>> negative Sputum 4/27 >> H flu, b-lactamase positive BC x 2 4/27 >> negative Urine 4/29 > negative BAL 5/4>>>No growth MRSA PCR 4/28>> negative  ANTIBIOTICS: Vanc 4/27>>>5/3 Aztreonam 4/27>>>5/3 Levofloxacin 4/28>>>5/5  SIGNIFICANT EVENTS: 4/27- Admit, possible HCAP, ett placed  LINES/TUBES:  ETT 4/27>>5/2   DISCUSSION: 60 y.o. M admitted with AECOPD and possible HCAP, failed BiPAP and required intubation. H flu PNA (note b-lactamase positive). He feels home meds adequate when ready for discharge home.  ASSESSMENT / PLAN:  PULMONARY A:  Acute on chronic hypoxic / hypercarbic respiratory failure - followed by Dr. Luan Pulling, on O2  4L at baseline AECOPD. HCAP > H flu P:   Titrate O2 for sat of 88-92%. Agree with planned steroid taper  Will need pulmonary rehab upon discharge. DuoNebs / Budesonide / Albuterol. To home regimen of spiriva + symbicort soon Restarted whole Daliresp   5/5 Intermittent CXR.  CARDIOVASCULAR A: Combined CHF - last echo from 04/13/13 with EF 40-45%, grade 1DD. ECHO>>>EF 55-60% and no tamponade. Hx CAD, AFib. P:  Continue eliquis  Continue outpatient plavix, atorvastatin. Agree with restart bisoprolol, lasix, losartan soon; BP and renal fxn should tolerate.  INFECTIOUS A: H flu CAP - last hospitalization in Jan 2017 PCN allergy P:   8 days abx completed on 5/5  NEUROLOGIC A: Hx anxiety P:   PRN Alprazolam q6hr prn for anxiety Hold outpatient gabapentin, restart per [facility] plans.  FAMILY  Updates:  No family at bedside 5/5.   Please call if needed through weekend  CD Jaci Desanto, MD  06/04/2015, 7:59 AM [facility] p [phone] or if no answer/ after 3:00 PM [phone]

## 2015-06-05 LAB — GLUCOSE, CAPILLARY
GLUCOSE-CAPILLARY: 91 mg/dL (ref 65–99)
GLUCOSE-CAPILLARY: 142 mg/dL — AB (ref 65–99)
Glucose-Capillary: 104 mg/dL — ABNORMAL HIGH (ref 65–99)
Glucose-Capillary: 159 mg/dL — ABNORMAL HIGH (ref 65–99)

## 2015-06-05 NOTE — Clinical Social Work Note (Signed)
Clinical Social Worker continuing to follow patient and family for support and discharge planning needs.  CSW spoke with patient and patient daughter at bedside to provide available bed offers.  Patient was hopeful for [facility], however did get declined.  Patient second choice is for [facility], however their bed offer is still pending and third choice is [facility].  Weekday CSW to follow up with [facility] and facilitate patient discharge once medically stable.  Patient daughter plans to provide transportation at discharge (will bring oxygen from home for car ride).  Barbette Or, LCSW (Weekend Coverage) [phone]

## 2015-06-05 NOTE — Progress Notes (Signed)
PROGRESS NOTE  Benjamin [name]  [id number] DOB: [birth year]  DOA: 05/26/2015 PCP: [name], MD  Outpatient Specialists:  Pulmonology: Dr. Sinda Du Cardiology: Dr. Glenetta Hew  Brief Narrative:  60 y/o male with hx COPD, CAD, AFib, and combined CHF who presented to [facility] ER 4/27 with 2 day hx SOB. He initially sought care at his PCP's office who sent him urgently to the ER with respiratory distress. Pt with worsening respiratory acidosis and lethargy despite BIPAP in ER. He was intubated and tx to [facility] with AECOPD and probable HCAP. Had admission 01/31/15 - 02/02/15 for AECOPD, discharged on azithro and prednisone taper.   Assessment & Plan:   Principal Problem:   Acute on chronic respiratory failure with hypoxia (HCC) Active Problems:   HCAP (healthcare-associated pneumonia)   Acute encephalopathy   AKI (acute kidney injury) ([place])   Acute on chronic hypoxic and hypercapnic respiratory failure - Attributed to COPD exacerbation and H. influenzae pneumonia - Failed BiPAP in ED. Admitted to ICU. He required ventilatory support. Extubated 5/2. - Patient on home oxygen 4 L/m at baseline. - Improved. Incentive spirometry and flutter valve.  COPD exacerbation - Improving. Taper steroids. Continue oxygen, Pulmicort, Flonase, Dulera, Spiriva, Reflumilast.  Chronic diastolic CHF - TTE [id number]: LVEF 55-60 percent. Previous echo 2015 had LVEF 40-45 percent which has now normalized. - Compensated.  CAD - Continue Plavix, statin and resumed bisoprolol.  Paroxysmal atrial fibrillation - Continue bisoprolol and Eliquis.   Acute kidney injury - Resolved.  Anemia - Stable  Penicillin allergy  Type II DM - Hemoglobin A1c 6.9. SSI. Good control.  Acute encephalopathy - Resolved.   DVT prophylaxis: on Eliquis Code Status: Full but NO trach/PEG/SNF or chronic Vent Family Communication: None at bedside. Disposition Plan: CIR coordinator input  appreciated-not candidate for CIR and recommend SNF. Social consultation in a.m.   Consultants:   CCM  Procedures:   BiPAP in ED  5/1 bronchoscopy  5/2 extubated  Antimicrobials:  Vanc 4/27 > 5/2 Aztreonam 4/27 > 5/2 Levofloxacin 4/28 > 5/5   Subjective: DOE. No other complaints reported. As per RN, no acute issues.  Objective:  Filed Vitals:   06/04/15 2021 06/04/15 2348 06/05/15 0623 06/05/15 1354  BP:  114/62 114/69 100/56  Pulse:  60 68 70  Temp:  98.7 F (37.1 C) 97.5 F (36.4 C) 97.6 F (36.4 C)  TempSrc:  Oral Oral   Resp:  18 20 19   Height:      Weight:   95.7 kg (210 lb 15.7 oz)   SpO2: 95% 96% 94% 92%    Intake/Output Summary (Last 24 hours) at 06/05/15 1444 Last data filed at 06/05/15 1349  Gross per 24 hour  Intake    640 ml  Output   1130 ml  Net   -490 ml   Filed Weights   06/03/15 2001 06/04/15 0500 06/05/15 0623  Weight: 97.6 kg (215 lb 2.7 oz) 91.6 kg (201 lb 15.1 oz) 95.7 kg (210 lb 15.7 oz)    Examination:  General exam: Pleasant 60-year-old male sitting up comfortably in the chair this morning. Appears calm and comfortable  Respiratory system: Distant breath sounds but clear to auscultation. Respiratory effort normal. Cardiovascular system: S1 & S2 heard, RRR.[name] No JVD, murmurs, rubs, gallops or clicks. No pedal edema. Gastrointestinal system: Abdomen is nondistended, soft and nontender. No organomegaly or masses felt. Normal bowel sounds heard. Central nervous system: Alert and oriented. No focal neurological deficits. Extremities: Symmetric 5 x 5  power. Skin: No rashes, lesions or ulcers Psychiatry: Judgement and insight appear normal. Mood & affect appropriate.     Data Reviewed: I have personally reviewed following labs and imaging studies  CBC:  Recent Labs Lab 05/30/15 0212 05/31/15 0217 06/01/15 0452 06/02/15 0232 06/03/15 0330  WBC 22.8* 25.6* 28.3* 22.2* 14.0*  HGB 8.8* 9.8* 10.3* 10.8* 11.1*  HCT 29.7* 33.9*  35.0* 36.3* 37.9*  MCV 97.1 96.0 96.2 94.5 93.8  PLT 392 410* 383 416* [id number]   Basic Metabolic Panel:  Recent Labs Lab 05/30/15 0212 05/31/15 0217 06/01/15 0452 06/02/15 0232 06/03/15 0330  NA 144 147* 142 139 143  K 5.2* 4.7 4.7 5.3* 4.6  CL 98* 96* 97* 95* 94*  CO2 36* 38* 35* 32 34*  GLUCOSE 211* 205* 121* 134* 105*  BUN 56* 65* 46* 41* 38*  CREATININE 0.92 0.89 0.73 0.80 0.87  CALCIUM 8.6* 9.3 9.0 9.2 9.5  MG 2.9* 2.9* 2.6* 2.5*  --   PHOS 3.1 3.4 4.3 5.8*  --    GFR: Estimated Creatinine Clearance: 108 mL/min (by C-G formula based on Cr of 0.87). Liver Function Tests:  Recent Labs Lab 06/03/15 0330  AST 17  ALT 18  ALKPHOS 52  BILITOT 0.7  PROT 6.9  ALBUMIN 2.6*   No results for input(s): LIPASE, AMYLASE in the last 168 hours. No results for input(s): AMMONIA in the last 168 hours. Coagulation Profile: No results for input(s): INR, PROTIME in the last 168 hours. Cardiac Enzymes: No results for input(s): CKTOTAL, CKMB, CKMBINDEX, TROPONINI in the last 168 hours. BNP (last 3 results) No results for input(s): PROBNP in the last 8760 hours. HbA1C:  Recent Labs  06/03/15 0315  HGBA1C 6.9*   CBG:  Recent Labs Lab 06/04/15 1203 06/04/15 1705 06/04/15 2348 06/05/15 0750 06/05/15 1218  GLUCAP 151* 92 120* 91 142*   Lipid Profile: No results for input(s): CHOL, HDL, LDLCALC, TRIG, CHOLHDL, LDLDIRECT in the last 72 hours. Thyroid Function Tests: No results for input(s): TSH, T4TOTAL, FREET4, T3FREE, THYROIDAB in the last 72 hours. Anemia Panel: No results for input(s): VITAMINB12, FOLATE, FERRITIN, TIBC, IRON, RETICCTPCT in the last 72 hours. Urine analysis:    Component Value Date/Time   COLORURINE YELLOW 05/26/2015 1400   APPEARANCEUR CLEAR 05/26/2015 1400   LABSPEC 1.020 05/26/2015 1400   PHURINE 5.0 05/26/2015 1400   GLUCOSEU NEGATIVE 05/26/2015 1400   HGBUR SMALL* 05/26/2015 1400   BILIRUBINUR NEGATIVE 05/26/2015 1400   KETONESUR NEGATIVE  05/26/2015 1400   PROTEINUR TRACE* 05/26/2015 1400   NITRITE NEGATIVE 05/26/2015 1400   LEUKOCYTESUR NEGATIVE 05/26/2015 1400    Recent Results (from the past 240 hour(s))  MRSA PCR Screening     Status: None   Collection Time: 05/26/15  7:34 PM  Result Value Ref Range Status   MRSA by PCR NEGATIVE NEGATIVE Final    Comment:        The GeneXpert MRSA Assay (FDA approved for NASAL specimens only), is one component of a comprehensive MRSA colonization surveillance program. It is not intended to diagnose MRSA infection nor to guide or monitor treatment for MRSA infections.   Culture, respiratory (NON-Expectorated)     Status: None   Collection Time: 05/26/15  8:00 PM  Result Value Ref Range Status   Specimen Description TRACHEAL ASPIRATE  Final   Special Requests Normal  Final   Gram Stain   Final    FEW WBC PRESENT,BOTH PMN AND MONONUCLEAR NO SQUAMOUS EPITHELIAL CELLS SEEN FEW GRAM  NEGATIVE COCCOBACILLI Performed at [facility]    Culture   Final    FEW HAEMOPHILUS INFLUENZAE Note: BETA LACTAMASE POSITIVE Performed at [facility]    Report Status 05/30/2015 FINAL  Final  Urine culture     Status: None   Collection Time: 05/26/15  8:24 PM  Result Value Ref Range Status   Specimen Description URINE, CATHETERIZED  Final   Special Requests Normal  Final   Culture NO GROWTH 2 DAYS  Final   Report Status 05/28/2015 FINAL  Final  Culture, blood (Routine X 2) w Reflex to ID Panel     Status: None   Collection Time: 05/26/15  9:02 PM  Result Value Ref Range Status   Specimen Description BLOOD LEFT HAND  Final   Special Requests IN PEDIATRIC BOTTLE 3CC  Final   Culture NO GROWTH 5 DAYS  Final   Report Status 05/31/2015 FINAL  Final  Culture, blood (Routine X 2) w Reflex to ID Panel     Status: None   Collection Time: 05/26/15  9:09 PM  Result Value Ref Range Status   Specimen Description BLOOD LEFT ANTECUBITAL  Final   Special Requests BOTTLES DRAWN  AEROBIC AND ANAEROBIC 5CC   Final   Culture NO GROWTH 5 DAYS  Final   Report Status 05/31/2015 FINAL  Final  Culture, bal-quantitative     Status: None   Collection Time: 05/30/15 10:35 AM  Result Value Ref Range Status   Specimen Description BRONCHIAL ALVEOLAR LAVAGE  Final   Special Requests Normal  Final   Gram Stain   Final    ABUNDANT WBC PRESENT,BOTH PMN AND MONONUCLEAR NO SQUAMOUS EPITHELIAL CELLS SEEN NO ORGANISMS SEEN Performed at [facility]    Culture   Final    NO GROWTH 2 DAYS Performed at [facility]    Report Status 06/02/2015 FINAL  Final  Pneumocystis smear by DFA     Status: None   Collection Time: 05/30/15 10:35 AM  Result Value Ref Range Status   Specimen Source-[facility] BRONCHIAL ALVEOLAR LAVAGE  Final   Pneumocystis jiroveci Ag NEGATIVE  Final    Comment: PERFORMED AT [facility]         Radiology Studies: No results found.      Scheduled Meds: . apixaban  5 mg Oral BID  . atorvastatin  40 mg Oral q1800  . bisoprolol  2.5 mg Oral Daily  . budesonide (PULMICORT) nebulizer solution  0.5 mg Nebulization BID  . chlorhexidine  15 mL Mouth/Throat BID  . clopidogrel  75 mg Oral Daily  . feeding supplement (ENSURE ENLIVE)  237 mL Oral BID BM  . fluticasone  2 spray Each Nare Daily  . insulin aspart  0-15 Units Subcutaneous TID WC  . losartan  50 mg Oral Daily  . mometasone-formoterol  2 puff Inhalation BID  . multivitamin with minerals  1 tablet Per Tube Daily  . pantoprazole  40 mg Oral QHS  . polyethylene glycol  17 g Oral Daily  . predniSONE  20 mg Oral BID WC  . roflumilast  500 mcg Oral Daily  . senna-docusate  1 tablet Oral BID  . tiotropium  18 mcg Inhalation Daily   Continuous Infusions:    LOS: 10 days    Time spent: 20 minutes.    [facility], MD [facility] Pager [phone]  If 7PM-7AM, please contact night-coverage [email or web address] Password [email or web address] 06/05/2015, 2:44 PM

## 2015-06-05 NOTE — Progress Notes (Signed)
Pt was too fatigued on 5/5 with P.T. To do more therapy besides sitting EOB and perform exercises. Feel pt not at a level for intense CIR therapy. Recommend SNF. [id number]

## 2015-06-06 ENCOUNTER — Other Ambulatory Visit: Payer: Self-pay | Admitting: Cardiology

## 2015-06-06 DIAGNOSIS — J189 Pneumonia, unspecified organism: Secondary | ICD-10-CM | POA: Diagnosis not present

## 2015-06-06 DIAGNOSIS — I10 Essential (primary) hypertension: Secondary | ICD-10-CM | POA: Diagnosis not present

## 2015-06-06 DIAGNOSIS — S37099A Other injury of unspecified kidney, initial encounter: Secondary | ICD-10-CM | POA: Diagnosis not present

## 2015-06-06 DIAGNOSIS — A419 Sepsis, unspecified organism: Secondary | ICD-10-CM | POA: Diagnosis not present

## 2015-06-06 DIAGNOSIS — I25118 Atherosclerotic heart disease of native coronary artery with other forms of angina pectoris: Secondary | ICD-10-CM | POA: Diagnosis not present

## 2015-06-06 DIAGNOSIS — J449 Chronic obstructive pulmonary disease, unspecified: Secondary | ICD-10-CM | POA: Diagnosis not present

## 2015-06-06 DIAGNOSIS — I480 Paroxysmal atrial fibrillation: Secondary | ICD-10-CM | POA: Diagnosis not present

## 2015-06-06 DIAGNOSIS — J441 Chronic obstructive pulmonary disease with (acute) exacerbation: Secondary | ICD-10-CM | POA: Diagnosis not present

## 2015-06-06 DIAGNOSIS — J9621 Acute and chronic respiratory failure with hypoxia: Secondary | ICD-10-CM | POA: Diagnosis not present

## 2015-06-06 DIAGNOSIS — N179 Acute kidney failure, unspecified: Secondary | ICD-10-CM | POA: Diagnosis not present

## 2015-06-06 DIAGNOSIS — Z9289 Personal history of other medical treatment: Secondary | ICD-10-CM | POA: Diagnosis not present

## 2015-06-06 DIAGNOSIS — D649 Anemia, unspecified: Secondary | ICD-10-CM | POA: Diagnosis not present

## 2015-06-06 DIAGNOSIS — G9340 Encephalopathy, unspecified: Secondary | ICD-10-CM | POA: Diagnosis not present

## 2015-06-06 DIAGNOSIS — J9601 Acute respiratory failure with hypoxia: Secondary | ICD-10-CM | POA: Diagnosis not present

## 2015-06-06 DIAGNOSIS — E785 Hyperlipidemia, unspecified: Secondary | ICD-10-CM | POA: Diagnosis not present

## 2015-06-06 DIAGNOSIS — G9349 Other encephalopathy: Secondary | ICD-10-CM | POA: Diagnosis not present

## 2015-06-06 DIAGNOSIS — E1165 Type 2 diabetes mellitus with hyperglycemia: Secondary | ICD-10-CM | POA: Diagnosis not present

## 2015-06-06 DIAGNOSIS — I25111 Atherosclerotic heart disease of native coronary artery with angina pectoris with documented spasm: Secondary | ICD-10-CM | POA: Diagnosis not present

## 2015-06-06 DIAGNOSIS — J9600 Acute respiratory failure, unspecified whether with hypoxia or hypercapnia: Secondary | ICD-10-CM | POA: Diagnosis not present

## 2015-06-06 DIAGNOSIS — I5042 Chronic combined systolic (congestive) and diastolic (congestive) heart failure: Secondary | ICD-10-CM | POA: Diagnosis not present

## 2015-06-06 DIAGNOSIS — E119 Type 2 diabetes mellitus without complications: Secondary | ICD-10-CM | POA: Diagnosis not present

## 2015-06-06 LAB — GLUCOSE, CAPILLARY: Glucose-Capillary: 95 mg/dL (ref 65–99)

## 2015-06-06 MED ORDER — SALINE SPRAY 0.65 % NA SOLN: 1.0000 | NASAL | Status: AC | PRN

## 2015-06-06 MED ORDER — PREDNISONE 10 MG PO TABS: ORAL_TABLET | ORAL | Status: DC

## 2015-06-06 MED ORDER — LEVALBUTEROL HCL 0.63 MG/3ML IN NEBU: 0.6300 mg | INHALATION_SOLUTION | RESPIRATORY_TRACT | Status: DC | PRN

## 2015-06-06 MED ORDER — FLUTICASONE PROPIONATE 50 MCG/ACT NA SUSP: 2.0000 | Freq: Every day | NASAL | Status: DC

## 2015-06-06 MED ORDER — ENSURE ENLIVE PO LIQD: 237.0000 mL | Freq: Two times a day (BID) | ORAL | Status: DC

## 2015-06-06 MED ORDER — FUROSEMIDE 40 MG PO TABS: 40.0000 mg | ORAL_TABLET | Freq: Every day | ORAL | Status: DC

## 2015-06-06 NOTE — Discharge Summary (Addendum)
Physician Discharge Summary  Benjamin [name]  [id number]  DOB: 12-24-1955  DOA: 05/26/2015  PCP: [name], MD  Outpatient Specialists: Pulmonology: Dr. Sinda Du Cardiology: Dr. Glenetta Hew  Admit date: 05/26/2015 Discharge date: 06/06/2015  Time spent: Greater than 30 minutes  Recommendations for Outpatient Follow-up:  1. MD at SNF in 3-5 days with repeat labs (CBC & BMP). 2. Recommend pulmonary rehab upon discharge from SNF rehab. 3. Recommend Intermittent CXR.  4. Dr. Sinda Du, Pulmonology, either at [facility] or upon discharge from SNF. 5. Dr. Glenetta Hew, Cardiology: In 2 weeks. 6. Dr. Redmond School, PCP in 1 week after discharge from SNF.  Discharge Diagnoses:  Principal Problem:   Acute on chronic respiratory failure with hypoxia (HCC) Active Problems:   HCAP (healthcare-associated pneumonia)   Acute encephalopathy   AKI (acute kidney injury) ([place])   Discharge Condition: Improved & Stable  Diet recommendation: Heart healthy and diabetic diet.  Filed Weights   06/03/15 2001 06/04/15 0500 06/05/15 0623  Weight: 97.6 kg (215 lb 2.7 oz) 91.6 kg (201 lb 15.1 oz) 95.7 kg (210 lb 15.7 oz)    History of present illness:  60 y/o male with hx COPD, CAD, AFib, and combined CHF who presented to [facility] ER 4/27 with 2 day hx SOB. He initially sought care at his PCP's office who sent him urgently to the ER with respiratory distress. Pt with worsening respiratory acidosis and lethargy despite BIPAP in ER. He was intubated and tx to [facility] with AECOPD and probable HCAP. Had admission 01/31/15 - 02/02/15 for AECOPD, discharged on azithro and prednisone taper.  Hospital Course:   Acute on chronic hypoxic and hypercapnic respiratory failure - Attributed to COPD exacerbation and H. influenzae pneumonia - Failed BiPAP in ED. Admitted to ICU. He required ventilatory support. Extubated 5/2. - Patient on home oxygen 4 L/m at baseline. He is back on his  home oxygen. - Incentive spirometry and flutter valve. - Acute respiratory failure resolved. - Pulmonology has seen today and recommend: Titrate oxygen for saturations of 88-92 percent, steroid taper, pulmonary rehabilitation upon discharge from SNF, follow-up with Dr. Luan Pulling in [place], continue Spiriva, Symbicort, Daliresp and Xopenex every 3 hours when necessary for wheezing.  COPD exacerbation, HCAP>H Flu - Exacerbation resolved. Completed antibiotic course. Taper steroids. Continue oxygen and meds as above. - Patient had Haemophilus influenza community acquired pneumonia during last hospitalization in January 2017.  Chronic diastolic CHF - TTE [id number]: LVEF 55-60 percent. Previous echo 2015 had LVEF 40-45 percent which has now normalized. - Trace bilateral ankle edema. Resume Lasix 40 mg daily at discharge. Monitor volume status closely at SNF and adjust diuretics as needed. Outpatient follow-up with cardiology.  CAD - Continue Plavix, statin and bisoprolol.  Paroxysmal atrial fibrillation - Continue bisoprolol and Eliquis.   Acute kidney injury - Resolved. Periodically monitor BMP.  Anemia - Stable  Penicillin allergy  Type II DM - Hemoglobin A1c 6.9. Diet control for now. Monitor CBGs periodically.  Acute encephalopathy - Resolved.   Consultants:   CCM  Procedures:   BiPAP in ED  5/1 bronchoscopy  5/2 extubated   Discharge Exam:  Complaints: Denies complaints. Has chronic dyspnea on exertion. No cough or chest pain reported. Felt slightly deconditioned/weak, especially when he tried to put on his clothes this morning.  Filed Vitals:   06/05/15 2105 06/06/15 0540 06/06/15 0926 06/06/15 0929  BP: 105/58 117/74    Pulse: 67 64    Temp: 99.5 F (37.5 C) 97.7  F (36.5 C)    TempSrc: Oral     Resp: 20 18    Height:      Weight:      SpO2: 92% 97% 97% 97%    General exam: Pleasant middle-aged male sitting up comfortably in chair this morning.  Daughter at bedside. Respiratory system: Distant breath sounds but clear to auscultation. No increased work of breathing. Cardiovascular system: S1 & S2 heard, RRR. No JVD, murmurs, gallops, clicks. Trace bilateral ankle edema.. Gastrointestinal system: Abdomen is nondistended, soft and nontender. Normal bowel sounds heard. Central nervous system: Alert and oriented. No focal neurological deficits. Extremities: Symmetric 5 x 5 power.  Discharge Instructions      Discharge Instructions    (HEART FAILURE PATIENTS) Call MD:  Anytime you have any of the following symptoms: 1) 3 pound weight gain in 24 hours or 5 pounds in 1 week 2) shortness of breath, with or without a dry hacking cough 3) swelling in the hands, feet or stomach 4) if you have to sleep on extra pillows at night in order to breathe.    Complete by:  As directed      Call MD for:  difficulty breathing, headache or visual disturbances    Complete by:  As directed      Call MD for:  extreme fatigue    Complete by:  As directed      Call MD for:  persistant dizziness or light-headedness    Complete by:  As directed      Call MD for:  persistant nausea and vomiting    Complete by:  As directed      Call MD for:  severe uncontrolled pain    Complete by:  As directed      Call MD for:  temperature >100.4    Complete by:  As directed      Diet - low sodium heart healthy    Complete by:  As directed      Diet Carb Modified    Complete by:  As directed      Increase activity slowly    Complete by:  As directed             Medication List    STOP taking these medications        ipratropium 0.06 % nasal spray  Commonly known as:  ATROVENT      TAKE these medications        acetaminophen 500 MG tablet  Commonly known as:  TYLENOL  Take by mouth every 6 (six) hours as needed for pain.     albuterol 108 (90 Base) MCG/ACT inhaler  Commonly known as:  PROVENTIL HFA;VENTOLIN HFA  Inhale 2 puffs into the lungs every 6  (six) hours as needed for wheezing.     apixaban 5 MG Tabs tablet  Commonly known as:  ELIQUIS  Take 1 tablet (5 mg total) by mouth 2 (two) times daily.     atorvastatin 40 MG tablet  Commonly known as:  LIPITOR  Take 1 tablet (40 mg total) by mouth daily.     bisoprolol 5 MG tablet  Commonly known as:  ZEBETA  TAKE 1/2 TABLET BY MOUTH DAILY     clopidogrel 75 MG tablet  Commonly known as:  PLAVIX  TAKE 1 TABLET BY MOUTH EVERY DAY     Co Q-10 100 MG Caps  Take 300 mg by mouth daily.     DALIRESP 500 MCG Tabs tablet  Generic drug:  roflumilast  Take 500 mcg by mouth daily.     feeding supplement (ENSURE ENLIVE) Liqd  Take 237 mLs by mouth 2 (two) times daily between meals.     fluticasone 50 MCG/ACT nasal spray  Commonly known as:  FLONASE  Place 2 sprays into both nostrils daily.     furosemide 40 MG tablet  Commonly known as:  LASIX  Take 1 tablet (40 mg total) by mouth daily.     gabapentin 300 MG capsule  Commonly known as:  NEURONTIN  Take 600 mg by mouth 4 (four) times daily.     levalbuterol 0.63 MG/3ML nebulizer solution  Commonly known as:  XOPENEX  Take 3 mLs (0.63 mg total) by nebulization every 3 (three) hours as needed for wheezing or shortness of breath.     losartan 50 MG tablet  Commonly known as:  COZAAR  Take 1 tablet (50 mg total) by mouth daily.     MUCINEX DM MAXIMUM STRENGTH PO  Take 1 tablet by mouth every 4 (four) hours as needed (congestion).     nitroGLYCERIN 0.4 MG SL tablet  Commonly known as:  NITROSTAT  Place 1 tablet (0.4 mg total) under the tongue every 5 (five) minutes x 3 doses as needed for chest pain.     OXYGEN  Inhale into the lungs. Using 4 liters nasal cannula ,continuous     potassium chloride 10 MEQ tablet  Commonly known as:  K-DUR  Take 10 mEq by mouth daily.     predniSONE 10 MG tablet  Commonly known as:  DELTASONE  20 mg BID x 2 days, then 30 mg QD x4 days, then 20 mg QD x4 days, then 10 mg QD x4 days, then  stop.     sodium chloride 0.65 % Soln nasal spray  Commonly known as:  OCEAN  Place 1 spray into both nostrils as needed for congestion.     SPIRIVA HANDIHALER 18 MCG inhalation capsule  Generic drug:  tiotropium  Place 18 mcg into inhaler and inhale daily.     SYMBICORT 160-4.5 MCG/ACT inhaler  Generic drug:  budesonide-formoterol  Inhale 2 puffs into the lungs 2 (two) times daily.         Get Medicines reviewed and adjusted: Please take all your medications with you for your next visit with your Primary MD  Please request your Primary MD to go over all hospital tests and procedure/radiological results at the follow up. Please ask your Primary MD to get all Hospital records sent to his/her office.  If you experience worsening of your admission symptoms, develop shortness of breath, life threatening emergency, suicidal or homicidal thoughts you must seek medical attention immediately by calling 911 or calling your MD immediately if symptoms less severe.  You must read complete instructions/literature along with all the possible adverse reactions/side effects for all the Medicines you take and that have been prescribed to you. Take any new Medicines after you have completely understood and accept all the possible adverse reactions/side effects.   Do not drive when taking pain medications.   Do not take more than prescribed Pain, Sleep and Anxiety Medications  Special Instructions: If you have smoked or chewed Tobacco in the last 2 yrs please stop smoking, stop any regular Alcohol and or any Recreational drug use.  Wear Seat belts while driving.  Please note  You were cared for by a hospitalist during your hospital stay. Once you are discharged, your primary care physician  will handle any further medical issues. Please note that NO REFILLS for any discharge medications will be authorized once you are discharged, as it is imperative that you return to your primary care physician  (or establish a relationship with a primary care physician if you do not have one) for your aftercare needs so that they can reassess your need for medications and monitor your lab values.    The results of significant diagnostics from this hospitalization (including imaging, microbiology, ancillary and laboratory) are listed below for reference.    Significant Diagnostic Studies: Dg Chest Port 1 View  06/03/2015  CLINICAL DATA:  Healthcare associated pneumonia EXAM: PORTABLE CHEST 1 VIEW COMPARISON:  05/31/2015 FINDINGS: There is hyperinflation of the lungs compatible with COPD. Improving aeration in the lung bases. Minimal residual bibasilar opacities. Mild peribronchial thickening. Heart is normal size. No visible effusions. IMPRESSION: Improving aeration in the lung bases with minimal residual atelectasis or infiltrate. COPD.  Mild bronchitic changes. Electronically Signed   By: Rolm Baptise M.D.   On: 06/03/2015 07:15   Dg Chest Port 1 View  05/31/2015  CLINICAL DATA:  Shortness of breath. EXAM: PORTABLE CHEST 1 VIEW COMPARISON:  05/30/2015. FINDINGS: Endotracheal tube, NG tube in stable position. Heart size stable. Low lung volumes with bibasilar atelectasis and/or infiltrates. COPD. Slight progression from prior exam. No pneumothorax. IMPRESSION: 1. Lines and tubes in stable position. 2. Low lung volumes with persistent bibasilar atelectasis and/or infiltrates. Slight progression from prior exam. COPD . Electronically Signed   By: McAdoo   On: 05/31/2015 07:13   Dg Chest Port 1 View  05/30/2015  CLINICAL DATA:  Respiratory failure. EXAM: PORTABLE CHEST 1 VIEW COMPARISON:  05/29/2015 . FINDINGS: Endotracheal tube and NG tube in stable position. Stable cardiomegaly. Low lung volumes with bibasilar atelectasis and/or infiltrates. COPD. No significant pleural effusion. No pneumothorax. IMPRESSION: 1.  Lines and tubes in stable position. 2. Low lung volumes with mild bibasilar atelectasis  and/or infiltrates again noted. No interim change. COPD. 3.  Stable cardiomegaly . Electronically Signed   By: Marcello Moores  Register   On: 05/30/2015 07:25   Dg Chest Port 1 View  05/29/2015  CLINICAL DATA:  60 year old male with history of acute respiratory failure. EXAM: PORTABLE CHEST 1 VIEW COMPARISON:  Chest x-ray 05/27/2015. FINDINGS: An endotracheal tube is in place with tip 5.2 cm above the carina. A nasogastric tube is seen extending into the stomach, however, the tip of the nasogastric tube extends below the lower margin of the image. Persistent bibasilar atelectasis and/or airspace consolidation (right greater than left), with small right pleural effusion which is increased compared to the prior study from 2 days ago. Emphysematous changes are again noted in the lungs. No evidence of pulmonary edema. Heart size is within normal limits. Upper mediastinal contours are grossly distorted by patient positioning. Atherosclerosis of the thoracic aorta. IMPRESSION: 1. Support apparatus, as above. 2. Bibasilar opacities may reflect atelectasis and/or airspace consolidation, generally similar to the prior study, however, there appears to be a the increasing small right pleural effusion. 3. Emphysema. 4. Atherosclerosis. Electronically Signed   By: Vinnie Langton M.D.   On: 05/29/2015 09:19   Dg Chest Port 1 View  05/27/2015  CLINICAL DATA:  Respiratory failure. EXAM: PORTABLE CHEST 1 VIEW COMPARISON:  05/26/2015 and 03/08/2015 FINDINGS: Endotracheal tube and OG tube appear unchanged. Severe emphysematous disease is again noted. There has been slight improvement in the infiltrate at the right lung base. No significant change of the  infiltrate in the left base, both superimposed on chronic lung disease. IMPRESSION: Severe emphysema with bibasilar infiltrates, slightly improved on the right. Electronically Signed   By: Lorriane Shire M.D.   On: 05/27/2015 07:19   Dg Chest Port 1 View  05/26/2015  CLINICAL DATA:   Endotracheal tube adjustment EXAM: PORTABLE CHEST 1 VIEW COMPARISON:  05/26/2015 FINDINGS: Advanced endotracheal tube, tip in good position between the clavicular heads and carina. An orogastric tube reaches the stomach at least. Emphysema with bibasilar pneumonia. Stable heart size and mediastinal contours, distorted by rightward rotation. No effusion or air leak. Remote appearing left posterior third rib fracture IMPRESSION: 1. Endotracheal and orogastric tubes are in good position. 2. Emphysema and bibasilar pneumonia. Electronically Signed   By: Monte Fantasia M.D.   On: 05/26/2015 23:50   Dg Chest Portable 1 View  05/26/2015  CLINICAL DATA:  ET TUBE PLACEMENT, OG TUBE PLACEMENT EXAM: PORTABLE CHEST 1 VIEW COMPARISON:  05/26/2015 at 1349 hours FINDINGS: New endotracheal tube has its tip projecting 5.6 cm above the carina. Nasal/orogastric tube passes below the diaphragm into the stomach. New Changes of emphysema and lung base airspace interstitial opacities are without change from the earlier study. IMPRESSION: 1. Endotracheal tube tip projects 5.6 cm above the carina. 2. Nasal/orogastric tube is well positioned passing into the stomach. Electronically Signed   By: Lajean Manes M.D.   On: 05/26/2015 15:35   Dg Chest Portable 1 View  05/26/2015  CLINICAL DATA:  Shortness of breath for 2 days EXAM: PORTABLE CHEST 1 VIEW COMPARISON:  03/08/2015 FINDINGS: Cardiac shadow is stable. Diffuse emphysematous changes are seen. Increased density is noted in the bases bilaterally consistent with some bibasilar atelectasis/infiltrate. No definitive effusion or pneumothorax is noted. IMPRESSION: Emphysematous changes with bibasilar opacities Electronically Signed   By: Inez Catalina M.D.   On: 05/26/2015 13:57    Microbiology: Recent Results (from the past 240 hour(s))  Culture, bal-quantitative     Status: None   Collection Time: 05/30/15 10:35 AM  Result Value Ref Range Status   Specimen Description BRONCHIAL  ALVEOLAR LAVAGE  Final   Special Requests Normal  Final   Gram Stain   Final    ABUNDANT WBC PRESENT,BOTH PMN AND MONONUCLEAR NO SQUAMOUS EPITHELIAL CELLS SEEN NO ORGANISMS SEEN Performed at [facility]    Culture   Final    NO GROWTH 2 DAYS Performed at [facility]    Report Status 06/02/2015 FINAL  Final  Pneumocystis smear by DFA     Status: None   Collection Time: 05/30/15 10:35 AM  Result Value Ref Range Status   Specimen Source-PJSRC BRONCHIAL ALVEOLAR LAVAGE  Final   Pneumocystis jiroveci Ag NEGATIVE  Final    Comment: PERFORMED AT [facility]     Labs: Basic Metabolic Panel:  Recent Labs Lab 05/31/15 0217 06/01/15 0452 06/02/15 0232 06/03/15 0330  NA 147* 142 139 143  K 4.7 4.7 5.3* 4.6  CL 96* 97* 95* 94*  CO2 38* 35* 32 34*  GLUCOSE 205* 121* 134* 105*  BUN 65* 46* 41* 38*  CREATININE 0.89 0.73 0.80 0.87  CALCIUM 9.3 9.0 9.2 9.5  MG 2.9* 2.6* 2.5*  --   PHOS 3.4 4.3 5.8*  --    Liver Function Tests:  Recent Labs Lab 06/03/15 0330  AST 17  ALT 18  ALKPHOS 52  BILITOT 0.7  PROT 6.9  ALBUMIN 2.6*   No results for input(s): LIPASE, AMYLASE in  the last 168 hours. No results for input(s): AMMONIA in the last 168 hours. CBC:  Recent Labs Lab 05/31/15 0217 06/01/15 0452 06/02/15 0232 06/03/15 0330  WBC 25.6* 28.3* 22.2* 14.0*  HGB 9.8* 10.3* 10.8* 11.1*  HCT 33.9* 35.0* 36.3* 37.9*  MCV 96.0 96.2 94.5 93.8  PLT 410* 383 416* 352   Cardiac Enzymes: No results for input(s): CKTOTAL, CKMB, CKMBINDEX, TROPONINI in the last 168 hours. BNP: BNP (last 3 results)  Recent Labs  01/31/15 1445 05/26/15 1410  BNP 63.0 317.0*    ProBNP (last 3 results) No results for input(s): PROBNP in the last 8760 hours.  CBG:  Recent Labs Lab 06/05/15 0750 06/05/15 1218 06/05/15 1639 06/05/15 2040 06/06/15 0809  GLUCAP 91 142* 104* 159* 95    Discussed in detail with patient's daughter at  bedside.   Signed:  Vernell Leep, MD, FACP, FHM. [facility] Pager [phone]  If 7PM-7AM, please contact night-coverage [email or web address] Password [email or web address] 06/06/2015, 11:17 AM

## 2015-06-06 NOTE — Progress Notes (Signed)
PULMONARY / CRITICAL CARE MEDICINE   Name: Benjamin [name] MRN: [id number] DOB: 1955/06/06    ADMISSION DATE:  05/26/2015  REFERRING MD:  Forestine Na   CHIEF COMPLAINT:  Respiratory failure   HISTORY OF PRESENT ILLNESS:   60 y/o male with hx COPD, CAD, AFib, combined CHF presented to [facility] ER 4/27 with 2 day hx SOB.  He initially sought care at his PCP's office who sent him urgently to ER with respiratory distress.  Pt with worsening respiratory acidosis and lethargy despite bipap attempts in ER.  He was intubated and tx to [facility] with AECOPD and probable HCAP.  Had recent admission 01/31/15 - 02/02/15 for AECOPD, discharged on azithro and prednisone taper.  SUBJECTIVE:  Pt reports feeling better but not back to where he was before admit.  "It took a lot out of me to get dressed this am".  Pt reports mobilization of secretions with flutter.  Pending discharge to rehab facility when bed available.    VITAL SIGNS: BP 117/74 mmHg  Pulse 64  Temp(Src) 97.7 F (36.5 C) (Oral)  Resp 18  Ht 5\' 11"  (1.803 m)  Wt 210 lb 15.7 oz (95.7 kg)  BMI 29.44 kg/m2  SpO2 97%      INTAKE / OUTPUT: I/O last 3 completed shifts: In: 640 [P.O.:640] Out: 1855 [Urine:1855]  PHYSICAL EXAMINATION: General: obese male in NAD, sitting up in chair Neuro: Alert and interactive, moving all ext to command. HEENT: [place]/AT. PERRL, sclerae anicteric. Cardiovascular: RRR, no M/R/G.  Lungs: Respiratory rate even non labored, distant breath sounds, no wheezing / crackles Abdomen: BSx4 hypoactive, large abd  Musculoskeletal: No gross deformities, no edema.  Skin: Intact, warm, no rashes.  LABS:  BMET  Recent Labs Lab 06/01/15 0452 06/02/15 0232 06/03/15 0330  NA 142 139 143  K 4.7 5.3* 4.6  CL 97* 95* 94*  CO2 35* 32 34*  BUN 46* 41* 38*  CREATININE 0.73 0.80 0.87  GLUCOSE 121* 134* 105*   Electrolytes  Recent Labs Lab 05/31/15 0217 06/01/15 0452 06/02/15 0232 06/03/15 0330  CALCIUM 9.3  9.0 9.2 9.5  MG 2.9* 2.6* 2.5*  --   PHOS 3.4 4.3 5.8*  --    CBC  Recent Labs Lab 06/01/15 0452 06/02/15 0232 06/03/15 0330  WBC 28.3* 22.2* 14.0*  HGB 10.3* 10.8* 11.1*  HCT 35.0* 36.3* 37.9*  PLT 383 416* 352    Coag's  Recent Labs Lab 05/30/15 1846 05/31/15 0219 06/01/15 0452  APTT 88* 76* 45*    ABG  Recent Labs Lab 05/31/15 0324  PHART 7.374  PCO2ART 74.6*  PO2ART 97.6    Liver Enzymes  Recent Labs Lab 06/03/15 0330  AST 17  ALT 18  ALKPHOS 52  BILITOT 0.7  ALBUMIN 2.6*   Glucose  Recent Labs Lab 06/04/15 2348 06/05/15 0750 06/05/15 1218 06/05/15 1639 06/05/15 2040 06/06/15 0809  GLUCAP 120* 91 142* 104* 159* 95    Imaging No results found.   STUDIES:  CXR 4/27 >> emphysematous changes. CXR 5/5 >> improving B infiltrates  CULTURES: MRSA PCR 4/28 >> negative Sputum 4/27 >> H flu, b-lactamase positive BC x 2 4/27 >> negative Urine 4/29 > negative BAL 5/4 >> No growth MRSA PCR 4/28 >> negative  ANTIBIOTICS: Vanc 4/27 >> 5/3 Aztreonam 4/27 >> 5/3 Levofloxacin 4/28 >> 5/5  SIGNIFICANT EVENTS: 4/27- Admit, possible HCAP, ett placed 5/02  Extubated  LINES/TUBES: ETT 4/27>>5/2   DISCUSSION: 60 y.o. M admitted with AECOPD and possible  HCAP, failed BiPAP and required intubation. H flu PNA (note b-lactamase positive). He feels home meds adequate when ready for discharge home.  ASSESSMENT / PLAN:  PULMONARY A:  Acute on chronic hypoxic / hypercarbic respiratory failure - followed by Dr. Luan Pulling, on O2  4L at baseline AECOPD. HCAP > H flu P:   Titrate O2 for sat of 88-92%. Agree with planned steroid taper > 20 mg BID x 4 days, then 30 mg QD x4 days, then 20 mg QD x4 days, then 10 mg QD x4 days Will need pulmonary rehab upon discharge from SNF rehab. Follow up with Dr. Luan Pulling in [place] / Budesonide / Albuterol while inpatient Discharge on home regimen of spiriva, symbicort, daliresp + Q3 PRN xopenex for  wheezing Intermittent CXR  CARDIOVASCULAR A: Combined CHF - last echo from 04/13/13 with EF 40-45%, grade 1DD. ECHO>>>EF 55-60% and no tamponade. Hx CAD, AFib. P:  Continue eliquis  Continue outpatient plavix, atorvastatin. Agree with restart bisoprolol, lasix, losartan soon; BP and renal fxn should tolerate.  INFECTIOUS A: H flu CAP - last hospitalization in Jan 2017 PCN allergy P:   8 days abx completed on 5/5  NEUROLOGIC A: Hx anxiety P:   PRN Alprazolam q6hr prn for anxiety Hold outpatient gabapentin, restart per [facility] plans.  FAMILY  Updates:  Patient and daughter updated 5/8.     PCCM will sign off.  Please call if we can be of further assistance.    Noe Gens, NP-C  Pulmonary & Critical Care Pgr: [phone] or if no answer [phone] 06/06/2015, 9:30 AM    STAFF NOTE: I, Merrie Roof, MD FACP have personally reviewed patient's available data, including medical history, events of note, physical examination and test results as part of my evaluation. I have discussed with resident/NP and other care providers such as pharmacist, RN and RRT. In addition, I personally evaluated patient and elicited key findings of: copd, resolving likely pna vs bronchitis, weka, no active wheezing, pred reduction over next 10 days, ambulation as able, see above and agree for BDer regimen, will sign off, WBC down,    Lavon Paganini. Titus Mould, MD, [place] Pgr: [facility] 06/06/2015 11:17 AM

## 2015-06-06 NOTE — Evaluation (Signed)
Occupational Therapy Evaluation Patient Details Name: Benjamin [name] MRN: [id number] DOB: [birth year] Today's Date: 06/06/2015    History of Present Illness Pt is a 60 y/o M who presented to [facility] 4/27 w/ SOB.  Pt w/ worsening respiratory acidosis and lethargy despite BiPap and was intubated 4/27-5/2.  Pt's PMH includes COPD, CAD, A-fib, combined CHF, STEMI 10/2012.   Clinical Impression   .Pt admitted with above.  He presents to OT with generalized weakness and deconditioning.  He is able to perform ADLs with min A, but fatigues rapidly and unable to perform other activities without substantial rest break.  He is planning for discharge to SNF today.  All other OT needs can be addressed at SNF.  Acute OT will sign off.     Follow Up Recommendations  SNF    Equipment Recommendations  None recommended by OT    Recommendations for Other Services       Precautions / Restrictions Precautions Precautions: Fall      Mobility Bed Mobility                  Transfers Overall transfer level: Needs assistance   Transfers: Sit to/from Stand Sit to Stand: Min guard Stand pivot transfers: Min guard       General transfer comment: min guard for safety     Balance Overall balance assessment: Needs assistance Sitting-balance support: Feet supported Sitting balance-Leahy Scale: Fair     Standing balance support: During functional activity;Single extremity supported Standing balance-Leahy Scale: Fair                              ADL Overall ADL's : Needs assistance/impaired Eating/Feeding: Independent   Grooming: Wash/dry hands;Wash/dry face;Oral care;Brushing hair;Set up;Sitting   Upper Body Bathing: Set up;Sitting   Lower Body Bathing: Minimal assistance;Sit to/from stand   Upper Body Dressing : Set up;Sitting   Lower Body Dressing: Min guard;Sit to/from stand   Toilet Transfer: Min guard;Comfort height toilet;RW;Grab bars;Stand-pivot    Toileting- Water quality scientist and Hygiene: Min guard;Sit to/from stand       Functional mobility during ADLs: Min guard;Rolling walker General ADL Comments: Pt fatigues raqpidly with activity.  DOE 3/4      Vision     Perception     Praxis      Pertinent Vitals/Pain Pain Assessment: No/denies pain     Hand Dominance     Extremity/Trunk Assessment Upper Extremity Assessment Upper Extremity Assessment: Generalized weakness   Lower Extremity Assessment Lower Extremity Assessment: Defer to PT evaluation   Cervical / Trunk Assessment Cervical / Trunk Assessment: Normal   Communication Communication Communication: No difficulties   Cognition Arousal/Alertness: Awake/alert Behavior During Therapy: Flat affect Overall Cognitive Status: Within Functional Limits for tasks assessed                     General Comments       Exercises       Shoulder Instructions      Home Living Family/patient expects to be discharged to:: Skilled nursing facility                                        Prior Functioning/Environment Level of Independence: Independent        Comments: Pt used 4L supplemental 02 PTA, but was driving and independent with  community activities PTA     OT Diagnosis: Generalized weakness   OT Problem List: Decreased strength;Decreased activity tolerance;Impaired balance (sitting and/or standing);Decreased knowledge of use of DME or AE;Cardiopulmonary status limiting activity   OT Treatment/Interventions:      OT Goals(Current goals can be found in the care plan section) Acute Rehab OT Goals Patient Stated Goal: to return to PLOF  OT Goal Formulation: All assessment and education complete, DC therapy  OT Frequency:     Barriers to D/C:            Co-evaluation              End of Session Equipment Utilized During Treatment: Rolling walker;Oxygen  Activity Tolerance: Patient limited by fatigue Patient left:  in chair;with call bell/phone within reach;with chair alarm set   Time: 1101-1110 OT Time Calculation (min): 9 min Charges:  OT General Charges $OT Visit: 1 Procedure OT Evaluation $OT Eval Moderate Complexity: 1 Procedure G-Codes:    Baillie Mohammad M 06/19/15, 11:13 AM

## 2015-06-06 NOTE — Telephone Encounter (Signed)
Rx request sent to pharmacy.  

## 2015-06-06 NOTE — Progress Notes (Signed)
Patient will DC to: [facility] Anticipated DC date: 06/06/15 Family notified: Daughter Transport by: Daughter   Per MD patient ready for DC to [facility]. RN, patient, patient's family, and facility notified of DC. RN given number for report.   CSW signing off.  Cedric Fishman, [place] Social Worker [phone]

## 2015-06-06 NOTE — Consult Note (Signed)
   [facility] CM Inpatient Consult   06/06/2015  Benjamin [name] [birth year] [id number] Follow up note:  Patient screened for potential [facility] services. Patient is eligible for [facility] Care Management services under patient's [facility]  plan.  Patient is currently being recommended for a skilled nursing facility for his rehab.  Spoke with inpatient RNCM who states this is the ongoing plan currently.  No current [facility] Care Management needs for community follow up.  Please call for questions contact:   Natividad Brood, RN BSN [facility] Liaison  [phone] business mobile phone Toll free office [phone]

## 2015-06-06 NOTE — Clinical Social Work Placement (Signed)
   CLINICAL SOCIAL WORK PLACEMENT  NOTE  Date:  06/06/2015  Patient Details  Name: Benjamin [name] MRN: [id number] Date of Birth: [birth year]  Clinical Social Work is seeking post-discharge placement for this patient at the [place] level of care (*CSW will initial, date and re-position this form in  chart as items are completed):  Yes   Patient/family provided with [facility] Work Department's list of facilities offering this level of care within the geographic area requested by the patient (or if unable, by the patient's family).  Yes   Patient/family informed of their freedom to choose among providers that offer the needed level of care, that participate in Medicare, Medicaid or managed care program needed by the patient, have an available bed and are willing to accept the patient.  Yes   Patient/family informed of [place]'s ownership interest in [facility] and [facility], as well as of the fact that they are under no obligation to receive care at these facilities.  PASRR submitted to EDS on 06/03/15     PASRR number received on 06/03/15     Existing PASRR number confirmed on       FL2 transmitted to all facilities in geographic area requested by pt/family on 06/03/15     FL2 transmitted to all facilities within larger geographic area on       Patient informed that his/her managed care company has contracts with or will negotiate with certain facilities, including the following:        Yes   Patient/family informed of bed offers received.  Patient [facility] bed at [facility]     Physician recommends and patient chooses bed at      Patient to be transferred to [facility] on 06/06/15.  Patient to be transferred to facility by Daughter car     Patient family notified on 06/06/15 of transfer.  Name of family member notified:  Daughter     PHYSICIAN       Additional Comment:     _______________________________________________ Benard Halsted, [place] 06/06/2015, 12:03 PM

## 2015-06-07 DIAGNOSIS — J441 Chronic obstructive pulmonary disease with (acute) exacerbation: Secondary | ICD-10-CM | POA: Diagnosis not present

## 2015-06-07 DIAGNOSIS — I25118 Atherosclerotic heart disease of native coronary artery with other forms of angina pectoris: Secondary | ICD-10-CM | POA: Diagnosis not present

## 2015-06-07 DIAGNOSIS — I5042 Chronic combined systolic (congestive) and diastolic (congestive) heart failure: Secondary | ICD-10-CM | POA: Diagnosis not present

## 2015-06-07 DIAGNOSIS — I480 Paroxysmal atrial fibrillation: Secondary | ICD-10-CM | POA: Diagnosis not present

## 2015-06-23 ENCOUNTER — Encounter: Payer: Self-pay | Admitting: Pulmonary Disease

## 2015-06-26 DIAGNOSIS — Z9981 Dependence on supplemental oxygen: Secondary | ICD-10-CM | POA: Diagnosis not present

## 2015-06-26 DIAGNOSIS — J9611 Chronic respiratory failure with hypoxia: Secondary | ICD-10-CM | POA: Diagnosis not present

## 2015-06-26 DIAGNOSIS — J441 Chronic obstructive pulmonary disease with (acute) exacerbation: Secondary | ICD-10-CM | POA: Diagnosis not present

## 2015-06-26 DIAGNOSIS — I2510 Atherosclerotic heart disease of native coronary artery without angina pectoris: Secondary | ICD-10-CM | POA: Diagnosis not present

## 2015-06-26 DIAGNOSIS — E1122 Type 2 diabetes mellitus with diabetic chronic kidney disease: Secondary | ICD-10-CM | POA: Diagnosis not present

## 2015-06-26 DIAGNOSIS — I130 Hypertensive heart and chronic kidney disease with heart failure and stage 1 through stage 4 chronic kidney disease, or unspecified chronic kidney disease: Secondary | ICD-10-CM | POA: Diagnosis not present

## 2015-06-26 DIAGNOSIS — Z7901 Long term (current) use of anticoagulants: Secondary | ICD-10-CM | POA: Diagnosis not present

## 2015-06-26 DIAGNOSIS — N189 Chronic kidney disease, unspecified: Secondary | ICD-10-CM | POA: Diagnosis not present

## 2015-06-26 DIAGNOSIS — I5032 Chronic diastolic (congestive) heart failure: Secondary | ICD-10-CM | POA: Diagnosis not present

## 2015-06-26 DIAGNOSIS — I480 Paroxysmal atrial fibrillation: Secondary | ICD-10-CM | POA: Diagnosis not present

## 2015-06-26 DIAGNOSIS — J9612 Chronic respiratory failure with hypercapnia: Secondary | ICD-10-CM | POA: Diagnosis not present

## 2015-06-29 DIAGNOSIS — E1122 Type 2 diabetes mellitus with diabetic chronic kidney disease: Secondary | ICD-10-CM | POA: Diagnosis not present

## 2015-06-29 DIAGNOSIS — I130 Hypertensive heart and chronic kidney disease with heart failure and stage 1 through stage 4 chronic kidney disease, or unspecified chronic kidney disease: Secondary | ICD-10-CM | POA: Diagnosis not present

## 2015-06-29 DIAGNOSIS — J9611 Chronic respiratory failure with hypoxia: Secondary | ICD-10-CM | POA: Diagnosis not present

## 2015-06-29 DIAGNOSIS — J9612 Chronic respiratory failure with hypercapnia: Secondary | ICD-10-CM | POA: Diagnosis not present

## 2015-06-29 DIAGNOSIS — Z9981 Dependence on supplemental oxygen: Secondary | ICD-10-CM | POA: Diagnosis not present

## 2015-06-29 DIAGNOSIS — I5032 Chronic diastolic (congestive) heart failure: Secondary | ICD-10-CM | POA: Diagnosis not present

## 2015-06-29 DIAGNOSIS — I480 Paroxysmal atrial fibrillation: Secondary | ICD-10-CM | POA: Diagnosis not present

## 2015-06-29 DIAGNOSIS — J441 Chronic obstructive pulmonary disease with (acute) exacerbation: Secondary | ICD-10-CM | POA: Diagnosis not present

## 2015-06-29 DIAGNOSIS — I2510 Atherosclerotic heart disease of native coronary artery without angina pectoris: Secondary | ICD-10-CM | POA: Diagnosis not present

## 2015-06-29 DIAGNOSIS — N189 Chronic kidney disease, unspecified: Secondary | ICD-10-CM | POA: Diagnosis not present

## 2015-06-29 DIAGNOSIS — Z7901 Long term (current) use of anticoagulants: Secondary | ICD-10-CM | POA: Diagnosis not present

## 2015-06-30 ENCOUNTER — Ambulatory Visit (HOSPITAL_COMMUNITY)
Admission: RE | Admit: 2015-06-30 | Discharge: 2015-06-30 | Disposition: A | Discharge status: Home / Self Care | Payer: Medicare Other | Source: Ambulatory Visit | Attending: Pulmonary Disease | Admitting: Pulmonary Disease

## 2015-06-30 ENCOUNTER — Other Ambulatory Visit (HOSPITAL_COMMUNITY): Payer: Self-pay | Admitting: Pulmonary Disease

## 2015-06-30 DIAGNOSIS — J189 Pneumonia, unspecified organism: Secondary | ICD-10-CM | POA: Diagnosis not present

## 2015-06-30 DIAGNOSIS — I110 Hypertensive heart disease with heart failure: Secondary | ICD-10-CM | POA: Diagnosis not present

## 2015-06-30 DIAGNOSIS — J690 Pneumonitis due to inhalation of food and vomit: Secondary | ICD-10-CM | POA: Diagnosis not present

## 2015-06-30 DIAGNOSIS — J9611 Chronic respiratory failure with hypoxia: Secondary | ICD-10-CM | POA: Diagnosis not present

## 2015-06-30 DIAGNOSIS — J449 Chronic obstructive pulmonary disease, unspecified: Secondary | ICD-10-CM | POA: Insufficient documentation

## 2015-06-30 DIAGNOSIS — I10 Essential (primary) hypertension: Secondary | ICD-10-CM | POA: Diagnosis not present

## 2015-07-01 DIAGNOSIS — Z9981 Dependence on supplemental oxygen: Secondary | ICD-10-CM | POA: Diagnosis not present

## 2015-07-01 DIAGNOSIS — I10 Essential (primary) hypertension: Secondary | ICD-10-CM | POA: Diagnosis not present

## 2015-07-01 DIAGNOSIS — I4891 Unspecified atrial fibrillation: Secondary | ICD-10-CM | POA: Diagnosis not present

## 2015-07-04 DIAGNOSIS — N189 Chronic kidney disease, unspecified: Secondary | ICD-10-CM | POA: Diagnosis not present

## 2015-07-04 DIAGNOSIS — I2510 Atherosclerotic heart disease of native coronary artery without angina pectoris: Secondary | ICD-10-CM | POA: Diagnosis not present

## 2015-07-04 DIAGNOSIS — E1122 Type 2 diabetes mellitus with diabetic chronic kidney disease: Secondary | ICD-10-CM | POA: Diagnosis not present

## 2015-07-04 DIAGNOSIS — I480 Paroxysmal atrial fibrillation: Secondary | ICD-10-CM | POA: Diagnosis not present

## 2015-07-04 DIAGNOSIS — J9611 Chronic respiratory failure with hypoxia: Secondary | ICD-10-CM | POA: Diagnosis not present

## 2015-07-04 DIAGNOSIS — J441 Chronic obstructive pulmonary disease with (acute) exacerbation: Secondary | ICD-10-CM | POA: Diagnosis not present

## 2015-07-04 DIAGNOSIS — Z9981 Dependence on supplemental oxygen: Secondary | ICD-10-CM | POA: Diagnosis not present

## 2015-07-04 DIAGNOSIS — I5032 Chronic diastolic (congestive) heart failure: Secondary | ICD-10-CM | POA: Diagnosis not present

## 2015-07-04 DIAGNOSIS — Z7901 Long term (current) use of anticoagulants: Secondary | ICD-10-CM | POA: Diagnosis not present

## 2015-07-04 DIAGNOSIS — J9612 Chronic respiratory failure with hypercapnia: Secondary | ICD-10-CM | POA: Diagnosis not present

## 2015-07-04 DIAGNOSIS — I130 Hypertensive heart and chronic kidney disease with heart failure and stage 1 through stage 4 chronic kidney disease, or unspecified chronic kidney disease: Secondary | ICD-10-CM | POA: Diagnosis not present

## 2015-07-05 ENCOUNTER — Ambulatory Visit (INDEPENDENT_AMBULATORY_CARE_PROVIDER_SITE_OTHER): Payer: Medicare Other | Admitting: Cardiology

## 2015-07-05 ENCOUNTER — Encounter: Payer: Self-pay | Admitting: Cardiology

## 2015-07-05 VITALS — BP 100/72 | HR 73 | Ht 71.0 in | Wt 209.6 lb

## 2015-07-05 DIAGNOSIS — I255 Ischemic cardiomyopathy: Secondary | ICD-10-CM | POA: Diagnosis not present

## 2015-07-05 DIAGNOSIS — I480 Paroxysmal atrial fibrillation: Secondary | ICD-10-CM

## 2015-07-05 DIAGNOSIS — I2119 ST elevation (STEMI) myocardial infarction involving other coronary artery of inferior wall: Secondary | ICD-10-CM | POA: Diagnosis not present

## 2015-07-05 DIAGNOSIS — I10 Essential (primary) hypertension: Secondary | ICD-10-CM | POA: Diagnosis not present

## 2015-07-05 DIAGNOSIS — I5042 Chronic combined systolic (congestive) and diastolic (congestive) heart failure: Secondary | ICD-10-CM

## 2015-07-05 DIAGNOSIS — E669 Obesity, unspecified: Secondary | ICD-10-CM

## 2015-07-05 NOTE — Progress Notes (Signed)
PCP: [name], MD  Clinic Note: Chief Complaint  Patient presents with  . Follow-up    6 months  pt c/o SOB constant--getting better; no other Sx.  . Coronary Artery Disease    History of STEMI with PCI RV. This was completed by shock.  . Atrial Fibrillation  . Cardiomyopathy    HPI: Benjamin [name] is a 60 y.o. male with a PMH below who presents today for three-month followup for his CAD he has chronic ischemic cardiomyopathy and chronic combined systolic/ diastolic heart failure along with long-standing COPD from a 40-pack-year smoking history. He is on home oxygen at 4 L.  He had an inferior STEMI in October of 2014 with very large RCA was occluded and old thrombus --> PCI with 5 mm x 24 mm VeriFlex BMS post dilated about 5.5 mm. It was relatively difficult post MI hospital course and cardiac shock and atrial fibrillation that required cardioversion. NO recurrent angina since - but has had some CHF Sx of combined systolic / diastolic CHF. He has a moderate ischemic EF of roughly 40-45% it was roughly stable on followup echo.  [facility] Dry Wgt = 212 lbs. (has gained weight - from lack of exercise - was 235 last visit).  I saw him in Dec 2017, - have not made significant adjustments since..  No recent Hospitalizations or studies reviewed. -- [facility] for PNA 4/27-5/8:  Intubated ICU. 6 COPD exacerbation with H. influenzae pneumonia. After initial attempt at BiPAP, he was intubated and placed on ventilator. Extubated on May 2 and back on home oxygen 4 L. Treated with antibiotics, steroids and nebulizers. Discharge to SNF - now back home.  Studies Reviewed:  - Left ventricle: TNormal LV size & function.  EF 55% to 60%. ECHO dense space in anterior pericardium and apex that may be pericardial fat, cannot exclude effusion. NO tamponade.  Interval History:   Benjamin [name] presents today actually feeling better than he has no a while now. He's lost a lot of weight from his  hospital stay, and notes his dyspnea has improved. He is back taking Lasix daily with sliding scale but only notes having to take rare additional doses. He is doing relatively well with his on home PT. He still having some wheezing and coughing, but no significant improvement in his breathing. Despite all this hospital stay and hypoxia/dyspnea, he is not had any chest tightness or pressure rest or exertion. He is not in any rapid irregular heartbeats or palpitations. There was no comment of having RVR with his A. fib. No PND, orthopnea, with stable mild edema. Improved with support stockings and being back on home Lasix.  Cardiovascular ROS: positive for - dyspnea on exertion, irregular heartbeat, shortness of breath and Stable edema. Breathing notably improved. negative for - chest pain, irregular heartbeat, orthopnea, paroxysmal nocturnal dyspnea, rapid heart rate, shortness of breath or Syncope/near syncope, TIA/amaurosis fugax.   Past Medical History  Diagnosis Date  . Tobacco abuse     40 Pk-yr (1- 1 1/2 PPD) --> Quit 11/18/2012 when presented with STEMI  . COPD (chronic obstructive pulmonary disease) ([place])   . STEMI of inferior wall 11/18/12- RCA BMS 11/17/2012    Cardiogenic shock - post MI with RV infarction [785.51]  . CAD S/P percutaneous coronary angioplasty  11/18/2012    - PCI mid RCA - 5.0 mm x 24 mm VeriFlex BMS (5.6 mm)  . Atrial fibrillation with RVR - peri-MI; recurrent after DCCV 11/19/2012  . Ischemic  cardiomyopathy 11/19/2012    EF 40-45% with inferoseptal HK, Gr 2DD - high LVEDP/LAP. --> confirmed by f/u Echo 03/2013.  [name] Dyslipidemia- low HDL 11/19/2012  . Chronic combined systolic and diastolic heart failure, NYHA class 2 ([place]) 11/17/2012  . Colon polyp     Status post polypectomy x3.   Past Surgical History  Procedure Laterality Date  . Dental extractions    . Coronary angioplasty with stent placement Right 11/17/12    Mid RCA aspiration thrombectomy and PCI with  VeriFlex BMS 5.0 mm x 24 mm (5.6 mm; also PTCA of distal RPL 4 distal embolization.  . Transthoracic echocardiogram  11/18/2012    Normal LV size, moderate reduced function 40-45% with severe HK-AK of the inferoseptal wall with incoordinate septal motion.  . Transthoracic echocardiogram  March 15    Moderately reduced LVEF of 40 at 45% with inferoseptal hypokinesis and high filling pressures.  . Colonoscopy N/A 11/20/2013    Procedure: COLONOSCOPY;  Surgeon: Jamesetta So, MD;  Location[facility];  Service: Gastroenterology;  Laterality: N/A;  . Left heart catheterization with coronary angiogram N/A 11/17/2012    Procedure: LEFT HEART CATHETERIZATION WITH CORONARY ANGIOGRAM;  Surgeon: Leonie Man, MD;  Location: [facility] CATH LAB;  Service: Cardiovascular;  Laterality: N/A;  . Transthoracic echocardiogram  May 2017    EF 55-60%.     ROS: A comprehensive was performed. Review of Systems  Constitutional: Negative for malaise/fatigue.  HENT: Negative for nosebleeds.   Respiratory: Positive for cough, shortness of breath and wheezing (No more than usual). Negative for sputum production (Am cough -- just finished Abx).   Cardiovascular: Negative for chest pain, palpitations, orthopnea, claudication, leg swelling and PND.  Gastrointestinal: Negative for heartburn, blood in stool and melena.  Genitourinary: Negative for dysuria and hematuria.  Musculoskeletal: Positive for back pain (occasional  - nothing major). Negative for falls.       Cramps helped with vinegar  Neurological: Negative for dizziness, sensory change, seizures, loss of consciousness and weakness.  Endo/Heme/Allergies: Bruises/bleeds easily (mild).  Psychiatric/Behavioral: Negative for depression and memory loss. The patient has insomnia (Sleeping better recently.). The patient is not nervous/anxious.   All other systems reviewed and are negative.  Prior to Admission medications   Medication Sig Start Date End Date Taking?  Authorizing Provider  acetaminophen (TYLENOL) 500 MG tablet Take by mouth every 6 (six) hours as needed for pain.   Yes Historical Provider, MD  albuterol (PROVENTIL HFA;VENTOLIN HFA) 108 (90 BASE) MCG/ACT inhaler Inhale 2 puffs into the lungs every 6 (six) hours as needed for wheezing.   Yes Historical Provider, MD  apixaban (ELIQUIS) 5 MG TABS tablet Take 1 tablet (5 mg total) by mouth 2 (two) times daily. 03/05/14  Yes Leonie Man, MD  atorvastatin (LIPITOR) 40 MG tablet TAKE 1 TABLET BY MOUTH EVERY DAY 06/06/15  Yes Leonie Man, MD  bisoprolol (ZEBETA) 5 MG tablet TAKE 1/2 TABLET BY MOUTH DAILY 05/06/15  Yes Leonie Man, MD  clopidogrel (PLAVIX) 75 MG tablet TAKE 1 TABLET BY MOUTH EVERY DAY 03/07/15  Yes Troy Sine, MD  Coenzyme Q10 (CO Q-10) 100 MG CAPS Take 300 mg by mouth daily.   Yes Historical Provider, MD  DALIRESP 500 MCG TABS tablet Take 500 mcg by mouth daily.  08/04/13  Yes Historical Provider, MD  Dextromethorphan-Guaifenesin (MUCINEX DM MAXIMUM STRENGTH PO) Take 1 tablet by mouth every 4 (four) hours as needed (congestion).    Yes Historical Provider,  MD  feeding supplement, ENSURE ENLIVE, (ENSURE ENLIVE) LIQD Take 237 mLs by mouth 2 (two) times daily between meals. 06/06/15  Yes Modena Jansky, MD  fluticasone (FLONASE) 50 MCG/ACT nasal spray Place 2 sprays into both nostrils daily. 06/06/15  Yes Modena Jansky, MD  furosemide (LASIX) 40 MG tablet Take 1 tablet (40 mg total) by mouth daily. 06/06/15  Yes Modena Jansky, MD  gabapentin (NEURONTIN) 300 MG capsule Take 600 mg by mouth 4 (four) times daily.   Yes Historical Provider, MD  levalbuterol (XOPENEX) 0.63 MG/3ML nebulizer solution Take 3 mLs (0.63 mg total) by nebulization every 3 (three) hours as needed for wheezing or shortness of breath. 06/06/15  Yes Modena Jansky, MD  losartan (COZAAR) 50 MG tablet TAKE 1 TABLET BY MOUTH EVERY DAY 06/06/15  Yes Leonie Man, MD  nitroGLYCERIN (NITROSTAT) 0.4 MG SL tablet Place 1  tablet (0.4 mg total) under the tongue every 5 (five) minutes x 3 doses as needed for chest pain. 11/22/12  Yes Brittainy Erie Noe, PA-C  OXYGEN-HELIUM IN Inhale into the lungs. Using 4 liters nasal cannula ,continuous   Yes Historical Provider, MD  potassium chloride (K-DUR) 10 MEQ tablet Take 10 mEq by mouth daily.   Yes Historical Provider, MD  predniSONE (DELTASONE) 10 MG tablet TAKE 4 TAB FOR 3 DAYS; 3 TAB FOR 3 DAYS; 2 TAB FOR 3 DAYS; THEN 1 TAB FOR 3 DAYS 06/30/15  Yes Historical Provider, MD  sodium chloride (OCEAN) 0.65 % SOLN nasal spray Place 1 spray into both nostrils as needed for congestion. 06/06/15  Yes Modena Jansky, MD  SPIRIVA HANDIHALER 18 MCG inhalation capsule Place 18 mcg into inhaler and inhale daily.  06/20/13  Yes Historical Provider, MD  sulfamethoxazole-trimethoprim (BACTRIM DS,SEPTRA DS) 800-160 MG tablet Take 1 tablet by mouth 2 (two) times daily. 06/30/15  Yes Historical Provider, MD  SYMBICORT 160-4.5 MCG/ACT inhaler Inhale 2 puffs into the lungs 2 (two) times daily.  06/18/13  Yes Historical Provider, MD    Allergies  Allergen Reactions  . Codeine     Red blotches  . Penicillins     Childhood reaction   Social History  Substance Use Topics  . Smoking status: Former Smoker -- 1.00 packs/day for 40 years  . Smokeless tobacco: Never Used     Comment: Angelina 10/2102  . Alcohol Use: No   Family History  Problem Relation Age of Onset  . Heart attack Mother   . Heart attack Father   . Cancer Sister     Wt Readings from Last 3 Encounters:  07/05/15 209 lb 9.6 oz (95.074 kg)  06/05/15 210 lb 15.7 oz (95.7 kg)  02/02/15 231 lb 3.2 oz (104.872 kg)  204 @ home (lost lots of wgt while in ICU)  PHYSICAL EXAM BP 100/72 mmHg  Pulse 73  Ht 5\' 11"  (1.803 m)  Wt 209 lb 9.6 oz (95.074 kg)  BMI 29.25 kg/m2  SpO2 96% General appearance: alert, cooperative, appears older than stated age, chronically ill appearing in mild distress. More  noticeable increased work of breathing.; on home oxygen and was breathing hard when walking into the clinic  Neck: mildlythe elevated JVP; supple no LAD and no carotid bruit.  Lungs: Diffusely reduced breath sounds bilateral lung bases to mid lung with crackles but no rales. Also diffuse wheezing noted. Normal respiratory for, but uses accessory muscles. He has increased AP diameter with prolonged expiratory phase with expiratory wheezing.  Heart: RRR, S1, S2 normal, no murmur, click, rub or gallop and normal apical impulse  Abdomen: soft, non-tender; bowel sounds normal; no masses, no organomegaly  Extremities: extremities normal, atraumatic, no cyanosis; 1-2+ bilateral LE edema; Pulses: 2+ and symmetric  Neurologic: Grossly normal   Adult ECG Report Not checked  Recent Labs:    Lab Results  Component Value Date   CHOL 168 07/23/2014   HDL 63 07/23/2014   LDLCALC 71 07/23/2014   TRIG 228* 06/01/2015   CHOLHDL 2.7 07/23/2014      Chemistry      Component Value Date/Time   NA 143 06/03/2015 0330   K 4.6 06/03/2015 0330   CL 94* 06/03/2015 0330   CO2 34* 06/03/2015 0330   BUN 38* 06/03/2015 0330   CREATININE 0.87 06/03/2015 0330   CREATININE 0.87 07/23/2014 0715      Component Value Date/Time   CALCIUM 9.5 06/03/2015 0330   ALKPHOS 52 06/03/2015 0330   AST 17 06/03/2015 0330   ALT 18 06/03/2015 0330   BILITOT 0.7 06/03/2015 0330     ASSESSMENT / PLAN: Problem List Items Addressed This Visit    STEMI of inferior wall 11/18/12- RCA BMS (Chronic)    Relatively large MI with reduction in ejection fraction on echo cardiac exam. Thankfully this is improved now he is not had any further anginal symptoms. He does have some chronic combined heart failure symptoms, but relatively well stabilized. It would appear that his EF has improved on echocardiogram with no significant regional wall motion abnormalities which is quite unexpected given his prior inferior hypokinesis. I suspect he  still has inferior hypokinesis of his EF is maybe 45-50%.      Paroxysmal Atrial fibrillation with RVR - peri-MI; recurrent after DCCV (Chronic)    As far as I can tell, no recurrence. He is on ELIQUIS for full anticoagulation. On beta blocker for rate control.      Obesity (BMI 30.0-34.9) (Chronic)    He actually now seems to lost enough weight to be below the "obesity threshold. He lost weight loss in the hospital back to encourage him to take Avinza this weight loss and to keep it that way. He is trying to do some walking as best he can with rehabilitation.      Essential hypertension (Chronic)    Well-controlled, the borderline hypertensive. Continue current meds. I told him he could potentially cut his dose of Cozaar in half or whole dose if he is exceedingly dyspneic in the evening.      Chronic combined systolic and diastolic heart failure, NYHA class 2 (HCC) (Chronic)    Becoming more more diastolic heart failure as opposed to combined heart failure. On stable regimen. If anything we may need to back down his medications because of hypotension. He is currently on stable dose of beta blocker, ARB and Lasix.      Cardiomyopathy, ischemic - EF 40-45%; now up to 55-60% - Primary (Chronic)    Interestingly now his EF has improved. I suspect is probably more in the 45-50 range, but still this is a notable improvement. He is not having much the way of significant heart failure symptoms, it is difficult to tell his dyspnea being COPD versus some heart failure. He is on Zebeta and losartan along with standing dose of Lasix. He also takes sliding scale dosing.         Your physician discussed the importance of regular exercise and recommended that you start or continue  a regular exercise program for good health.  No medication changes    Your physician wants you to follow-up in [place] - 30 MIN No orders of the defined types were placed in this encounter.    Glenetta Hew, M.D., M.S. Interventional Cardiologist   Pager # [phone]

## 2015-07-05 NOTE — Patient Instructions (Signed)
NO CHANGES WITH CURRENT MEDICATIONS    Your physician wants you to follow-up in 6 MONTHS WITH DR HARDING.You will receive a reminder letter in the mail two months in advance. If you don't receive a letter, please call our office to schedule the follow-up appointment.    If you need a refill on your cardiac medications before your next appointment, please call your pharmacy.  

## 2015-07-06 DIAGNOSIS — I480 Paroxysmal atrial fibrillation: Secondary | ICD-10-CM | POA: Diagnosis not present

## 2015-07-06 DIAGNOSIS — Z7901 Long term (current) use of anticoagulants: Secondary | ICD-10-CM | POA: Diagnosis not present

## 2015-07-06 DIAGNOSIS — J9612 Chronic respiratory failure with hypercapnia: Secondary | ICD-10-CM | POA: Diagnosis not present

## 2015-07-06 DIAGNOSIS — J441 Chronic obstructive pulmonary disease with (acute) exacerbation: Secondary | ICD-10-CM | POA: Diagnosis not present

## 2015-07-06 DIAGNOSIS — I5032 Chronic diastolic (congestive) heart failure: Secondary | ICD-10-CM | POA: Diagnosis not present

## 2015-07-06 DIAGNOSIS — I130 Hypertensive heart and chronic kidney disease with heart failure and stage 1 through stage 4 chronic kidney disease, or unspecified chronic kidney disease: Secondary | ICD-10-CM | POA: Diagnosis not present

## 2015-07-06 DIAGNOSIS — N189 Chronic kidney disease, unspecified: Secondary | ICD-10-CM | POA: Diagnosis not present

## 2015-07-06 DIAGNOSIS — I2510 Atherosclerotic heart disease of native coronary artery without angina pectoris: Secondary | ICD-10-CM | POA: Diagnosis not present

## 2015-07-06 DIAGNOSIS — E1122 Type 2 diabetes mellitus with diabetic chronic kidney disease: Secondary | ICD-10-CM | POA: Diagnosis not present

## 2015-07-06 DIAGNOSIS — J9611 Chronic respiratory failure with hypoxia: Secondary | ICD-10-CM | POA: Diagnosis not present

## 2015-07-06 DIAGNOSIS — Z9981 Dependence on supplemental oxygen: Secondary | ICD-10-CM | POA: Diagnosis not present

## 2015-07-07 ENCOUNTER — Encounter: Payer: Self-pay | Admitting: Cardiology

## 2015-07-07 NOTE — Assessment & Plan Note (Signed)
As far as I can tell, no recurrence. He is on ELIQUIS for full anticoagulation. On beta blocker for rate control.

## 2015-07-07 NOTE — Assessment & Plan Note (Signed)
Interestingly now his EF has improved. I suspect is probably more in the 45-50 range, but still this is a notable improvement. He is not having much the way of significant heart failure symptoms, it is difficult to tell his dyspnea being COPD versus some heart failure. He is on Zebeta and losartan along with standing dose of Lasix. He also takes sliding scale dosing.

## 2015-07-07 NOTE — Assessment & Plan Note (Signed)
Relatively large MI with reduction in ejection fraction on echo cardiac exam. Thankfully this is improved now he is not had any further anginal symptoms. He does have some chronic combined heart failure symptoms, but relatively well stabilized. It would appear that his EF has improved on echocardiogram with no significant regional wall motion abnormalities which is quite unexpected given his prior inferior hypokinesis. I suspect he still has inferior hypokinesis of his EF is maybe 45-50%.

## 2015-07-07 NOTE — Assessment & Plan Note (Signed)
He actually now seems to lost enough weight to be below the "obesity threshold. He lost weight loss in the hospital back to encourage him to take Avinza this weight loss and to keep it that way. He is trying to do some walking as best he can with rehabilitation.

## 2015-07-07 NOTE — Assessment & Plan Note (Signed)
Becoming more more diastolic heart failure as opposed to combined heart failure. On stable regimen. If anything we may need to back down his medications because of hypotension. He is currently on stable dose of beta blocker, ARB and Lasix.

## 2015-07-07 NOTE — Assessment & Plan Note (Addendum)
Well-controlled, the borderline hypertensive. Continue current meds. I told him he could potentially cut his dose of Cozaar in half or whole dose if he is exceedingly dyspneic in the evening.

## 2015-07-08 DIAGNOSIS — I5032 Chronic diastolic (congestive) heart failure: Secondary | ICD-10-CM | POA: Diagnosis not present

## 2015-07-08 DIAGNOSIS — E1122 Type 2 diabetes mellitus with diabetic chronic kidney disease: Secondary | ICD-10-CM | POA: Diagnosis not present

## 2015-07-08 DIAGNOSIS — J441 Chronic obstructive pulmonary disease with (acute) exacerbation: Secondary | ICD-10-CM | POA: Diagnosis not present

## 2015-07-08 DIAGNOSIS — Z7901 Long term (current) use of anticoagulants: Secondary | ICD-10-CM | POA: Diagnosis not present

## 2015-07-08 DIAGNOSIS — I480 Paroxysmal atrial fibrillation: Secondary | ICD-10-CM | POA: Diagnosis not present

## 2015-07-08 DIAGNOSIS — I2510 Atherosclerotic heart disease of native coronary artery without angina pectoris: Secondary | ICD-10-CM | POA: Diagnosis not present

## 2015-07-08 DIAGNOSIS — J9612 Chronic respiratory failure with hypercapnia: Secondary | ICD-10-CM | POA: Diagnosis not present

## 2015-07-08 DIAGNOSIS — Z9981 Dependence on supplemental oxygen: Secondary | ICD-10-CM | POA: Diagnosis not present

## 2015-07-08 DIAGNOSIS — N189 Chronic kidney disease, unspecified: Secondary | ICD-10-CM | POA: Diagnosis not present

## 2015-07-08 DIAGNOSIS — J9611 Chronic respiratory failure with hypoxia: Secondary | ICD-10-CM | POA: Diagnosis not present

## 2015-07-08 DIAGNOSIS — I130 Hypertensive heart and chronic kidney disease with heart failure and stage 1 through stage 4 chronic kidney disease, or unspecified chronic kidney disease: Secondary | ICD-10-CM | POA: Diagnosis not present

## 2015-07-12 DIAGNOSIS — J9611 Chronic respiratory failure with hypoxia: Secondary | ICD-10-CM | POA: Diagnosis not present

## 2015-07-12 DIAGNOSIS — I130 Hypertensive heart and chronic kidney disease with heart failure and stage 1 through stage 4 chronic kidney disease, or unspecified chronic kidney disease: Secondary | ICD-10-CM | POA: Diagnosis not present

## 2015-07-12 DIAGNOSIS — E1122 Type 2 diabetes mellitus with diabetic chronic kidney disease: Secondary | ICD-10-CM | POA: Diagnosis not present

## 2015-07-12 DIAGNOSIS — J441 Chronic obstructive pulmonary disease with (acute) exacerbation: Secondary | ICD-10-CM | POA: Diagnosis not present

## 2015-07-12 DIAGNOSIS — I2510 Atherosclerotic heart disease of native coronary artery without angina pectoris: Secondary | ICD-10-CM | POA: Diagnosis not present

## 2015-07-12 DIAGNOSIS — J9612 Chronic respiratory failure with hypercapnia: Secondary | ICD-10-CM | POA: Diagnosis not present

## 2015-07-12 DIAGNOSIS — Z9981 Dependence on supplemental oxygen: Secondary | ICD-10-CM | POA: Diagnosis not present

## 2015-07-12 DIAGNOSIS — Z7901 Long term (current) use of anticoagulants: Secondary | ICD-10-CM | POA: Diagnosis not present

## 2015-07-12 DIAGNOSIS — N189 Chronic kidney disease, unspecified: Secondary | ICD-10-CM | POA: Diagnosis not present

## 2015-07-12 DIAGNOSIS — I5032 Chronic diastolic (congestive) heart failure: Secondary | ICD-10-CM | POA: Diagnosis not present

## 2015-07-12 DIAGNOSIS — I480 Paroxysmal atrial fibrillation: Secondary | ICD-10-CM | POA: Diagnosis not present

## 2015-07-14 DIAGNOSIS — I2510 Atherosclerotic heart disease of native coronary artery without angina pectoris: Secondary | ICD-10-CM | POA: Diagnosis not present

## 2015-07-14 DIAGNOSIS — I130 Hypertensive heart and chronic kidney disease with heart failure and stage 1 through stage 4 chronic kidney disease, or unspecified chronic kidney disease: Secondary | ICD-10-CM | POA: Diagnosis not present

## 2015-07-14 DIAGNOSIS — N189 Chronic kidney disease, unspecified: Secondary | ICD-10-CM | POA: Diagnosis not present

## 2015-07-14 DIAGNOSIS — Z9981 Dependence on supplemental oxygen: Secondary | ICD-10-CM | POA: Diagnosis not present

## 2015-07-14 DIAGNOSIS — Z7901 Long term (current) use of anticoagulants: Secondary | ICD-10-CM | POA: Diagnosis not present

## 2015-07-14 DIAGNOSIS — I5032 Chronic diastolic (congestive) heart failure: Secondary | ICD-10-CM | POA: Diagnosis not present

## 2015-07-14 DIAGNOSIS — I480 Paroxysmal atrial fibrillation: Secondary | ICD-10-CM | POA: Diagnosis not present

## 2015-07-14 DIAGNOSIS — J441 Chronic obstructive pulmonary disease with (acute) exacerbation: Secondary | ICD-10-CM | POA: Diagnosis not present

## 2015-07-14 DIAGNOSIS — J9611 Chronic respiratory failure with hypoxia: Secondary | ICD-10-CM | POA: Diagnosis not present

## 2015-07-14 DIAGNOSIS — J9612 Chronic respiratory failure with hypercapnia: Secondary | ICD-10-CM | POA: Diagnosis not present

## 2015-07-14 DIAGNOSIS — E1122 Type 2 diabetes mellitus with diabetic chronic kidney disease: Secondary | ICD-10-CM | POA: Diagnosis not present

## 2015-07-15 DIAGNOSIS — I5032 Chronic diastolic (congestive) heart failure: Secondary | ICD-10-CM | POA: Diagnosis not present

## 2015-07-15 DIAGNOSIS — N189 Chronic kidney disease, unspecified: Secondary | ICD-10-CM | POA: Diagnosis not present

## 2015-07-15 DIAGNOSIS — Z7901 Long term (current) use of anticoagulants: Secondary | ICD-10-CM | POA: Diagnosis not present

## 2015-07-15 DIAGNOSIS — J9611 Chronic respiratory failure with hypoxia: Secondary | ICD-10-CM | POA: Diagnosis not present

## 2015-07-15 DIAGNOSIS — J441 Chronic obstructive pulmonary disease with (acute) exacerbation: Secondary | ICD-10-CM | POA: Diagnosis not present

## 2015-07-15 DIAGNOSIS — E1122 Type 2 diabetes mellitus with diabetic chronic kidney disease: Secondary | ICD-10-CM | POA: Diagnosis not present

## 2015-07-15 DIAGNOSIS — I480 Paroxysmal atrial fibrillation: Secondary | ICD-10-CM | POA: Diagnosis not present

## 2015-07-15 DIAGNOSIS — J9612 Chronic respiratory failure with hypercapnia: Secondary | ICD-10-CM | POA: Diagnosis not present

## 2015-07-15 DIAGNOSIS — I2510 Atherosclerotic heart disease of native coronary artery without angina pectoris: Secondary | ICD-10-CM | POA: Diagnosis not present

## 2015-07-15 DIAGNOSIS — Z9981 Dependence on supplemental oxygen: Secondary | ICD-10-CM | POA: Diagnosis not present

## 2015-07-15 DIAGNOSIS — I130 Hypertensive heart and chronic kidney disease with heart failure and stage 1 through stage 4 chronic kidney disease, or unspecified chronic kidney disease: Secondary | ICD-10-CM | POA: Diagnosis not present

## 2015-07-19 DIAGNOSIS — J9611 Chronic respiratory failure with hypoxia: Secondary | ICD-10-CM | POA: Diagnosis not present

## 2015-07-19 DIAGNOSIS — Z7901 Long term (current) use of anticoagulants: Secondary | ICD-10-CM | POA: Diagnosis not present

## 2015-07-19 DIAGNOSIS — I480 Paroxysmal atrial fibrillation: Secondary | ICD-10-CM | POA: Diagnosis not present

## 2015-07-19 DIAGNOSIS — Z9981 Dependence on supplemental oxygen: Secondary | ICD-10-CM | POA: Diagnosis not present

## 2015-07-19 DIAGNOSIS — J441 Chronic obstructive pulmonary disease with (acute) exacerbation: Secondary | ICD-10-CM | POA: Diagnosis not present

## 2015-07-19 DIAGNOSIS — I5032 Chronic diastolic (congestive) heart failure: Secondary | ICD-10-CM | POA: Diagnosis not present

## 2015-07-19 DIAGNOSIS — N189 Chronic kidney disease, unspecified: Secondary | ICD-10-CM | POA: Diagnosis not present

## 2015-07-19 DIAGNOSIS — J9612 Chronic respiratory failure with hypercapnia: Secondary | ICD-10-CM | POA: Diagnosis not present

## 2015-07-19 DIAGNOSIS — I130 Hypertensive heart and chronic kidney disease with heart failure and stage 1 through stage 4 chronic kidney disease, or unspecified chronic kidney disease: Secondary | ICD-10-CM | POA: Diagnosis not present

## 2015-07-19 DIAGNOSIS — I2510 Atherosclerotic heart disease of native coronary artery without angina pectoris: Secondary | ICD-10-CM | POA: Diagnosis not present

## 2015-07-19 DIAGNOSIS — E1122 Type 2 diabetes mellitus with diabetic chronic kidney disease: Secondary | ICD-10-CM | POA: Diagnosis not present

## 2015-07-20 DIAGNOSIS — J9611 Chronic respiratory failure with hypoxia: Secondary | ICD-10-CM | POA: Diagnosis not present

## 2015-07-20 DIAGNOSIS — J441 Chronic obstructive pulmonary disease with (acute) exacerbation: Secondary | ICD-10-CM | POA: Diagnosis not present

## 2015-07-20 DIAGNOSIS — I130 Hypertensive heart and chronic kidney disease with heart failure and stage 1 through stage 4 chronic kidney disease, or unspecified chronic kidney disease: Secondary | ICD-10-CM | POA: Diagnosis not present

## 2015-07-20 DIAGNOSIS — E1122 Type 2 diabetes mellitus with diabetic chronic kidney disease: Secondary | ICD-10-CM | POA: Diagnosis not present

## 2015-07-20 DIAGNOSIS — I2510 Atherosclerotic heart disease of native coronary artery without angina pectoris: Secondary | ICD-10-CM | POA: Diagnosis not present

## 2015-07-20 DIAGNOSIS — J9612 Chronic respiratory failure with hypercapnia: Secondary | ICD-10-CM | POA: Diagnosis not present

## 2015-07-20 DIAGNOSIS — N189 Chronic kidney disease, unspecified: Secondary | ICD-10-CM | POA: Diagnosis not present

## 2015-07-20 DIAGNOSIS — I5032 Chronic diastolic (congestive) heart failure: Secondary | ICD-10-CM | POA: Diagnosis not present

## 2015-07-20 DIAGNOSIS — Z9981 Dependence on supplemental oxygen: Secondary | ICD-10-CM | POA: Diagnosis not present

## 2015-07-20 DIAGNOSIS — I480 Paroxysmal atrial fibrillation: Secondary | ICD-10-CM | POA: Diagnosis not present

## 2015-07-20 DIAGNOSIS — Z7901 Long term (current) use of anticoagulants: Secondary | ICD-10-CM | POA: Diagnosis not present

## 2015-07-22 DIAGNOSIS — Z7901 Long term (current) use of anticoagulants: Secondary | ICD-10-CM | POA: Diagnosis not present

## 2015-07-22 DIAGNOSIS — I5032 Chronic diastolic (congestive) heart failure: Secondary | ICD-10-CM | POA: Diagnosis not present

## 2015-07-22 DIAGNOSIS — I480 Paroxysmal atrial fibrillation: Secondary | ICD-10-CM | POA: Diagnosis not present

## 2015-07-22 DIAGNOSIS — J9612 Chronic respiratory failure with hypercapnia: Secondary | ICD-10-CM | POA: Diagnosis not present

## 2015-07-22 DIAGNOSIS — I130 Hypertensive heart and chronic kidney disease with heart failure and stage 1 through stage 4 chronic kidney disease, or unspecified chronic kidney disease: Secondary | ICD-10-CM | POA: Diagnosis not present

## 2015-07-22 DIAGNOSIS — Z9981 Dependence on supplemental oxygen: Secondary | ICD-10-CM | POA: Diagnosis not present

## 2015-07-22 DIAGNOSIS — J441 Chronic obstructive pulmonary disease with (acute) exacerbation: Secondary | ICD-10-CM | POA: Diagnosis not present

## 2015-07-22 DIAGNOSIS — J9611 Chronic respiratory failure with hypoxia: Secondary | ICD-10-CM | POA: Diagnosis not present

## 2015-07-22 DIAGNOSIS — I2510 Atherosclerotic heart disease of native coronary artery without angina pectoris: Secondary | ICD-10-CM | POA: Diagnosis not present

## 2015-07-22 DIAGNOSIS — N189 Chronic kidney disease, unspecified: Secondary | ICD-10-CM | POA: Diagnosis not present

## 2015-07-22 DIAGNOSIS — E1122 Type 2 diabetes mellitus with diabetic chronic kidney disease: Secondary | ICD-10-CM | POA: Diagnosis not present

## 2015-07-26 DIAGNOSIS — I2510 Atherosclerotic heart disease of native coronary artery without angina pectoris: Secondary | ICD-10-CM | POA: Diagnosis not present

## 2015-07-26 DIAGNOSIS — I480 Paroxysmal atrial fibrillation: Secondary | ICD-10-CM | POA: Diagnosis not present

## 2015-07-26 DIAGNOSIS — I130 Hypertensive heart and chronic kidney disease with heart failure and stage 1 through stage 4 chronic kidney disease, or unspecified chronic kidney disease: Secondary | ICD-10-CM | POA: Diagnosis not present

## 2015-07-26 DIAGNOSIS — I5032 Chronic diastolic (congestive) heart failure: Secondary | ICD-10-CM | POA: Diagnosis not present

## 2015-07-26 DIAGNOSIS — Z9981 Dependence on supplemental oxygen: Secondary | ICD-10-CM | POA: Diagnosis not present

## 2015-07-26 DIAGNOSIS — Z7901 Long term (current) use of anticoagulants: Secondary | ICD-10-CM | POA: Diagnosis not present

## 2015-07-26 DIAGNOSIS — J9611 Chronic respiratory failure with hypoxia: Secondary | ICD-10-CM | POA: Diagnosis not present

## 2015-07-26 DIAGNOSIS — E1122 Type 2 diabetes mellitus with diabetic chronic kidney disease: Secondary | ICD-10-CM | POA: Diagnosis not present

## 2015-07-26 DIAGNOSIS — J9612 Chronic respiratory failure with hypercapnia: Secondary | ICD-10-CM | POA: Diagnosis not present

## 2015-07-26 DIAGNOSIS — N189 Chronic kidney disease, unspecified: Secondary | ICD-10-CM | POA: Diagnosis not present

## 2015-07-26 DIAGNOSIS — J449 Chronic obstructive pulmonary disease, unspecified: Secondary | ICD-10-CM | POA: Diagnosis not present

## 2015-07-26 DIAGNOSIS — J441 Chronic obstructive pulmonary disease with (acute) exacerbation: Secondary | ICD-10-CM | POA: Diagnosis not present

## 2015-07-28 DIAGNOSIS — N189 Chronic kidney disease, unspecified: Secondary | ICD-10-CM | POA: Diagnosis not present

## 2015-07-28 DIAGNOSIS — J9611 Chronic respiratory failure with hypoxia: Secondary | ICD-10-CM | POA: Diagnosis not present

## 2015-07-28 DIAGNOSIS — J441 Chronic obstructive pulmonary disease with (acute) exacerbation: Secondary | ICD-10-CM | POA: Diagnosis not present

## 2015-07-28 DIAGNOSIS — E1122 Type 2 diabetes mellitus with diabetic chronic kidney disease: Secondary | ICD-10-CM | POA: Diagnosis not present

## 2015-07-28 DIAGNOSIS — I5032 Chronic diastolic (congestive) heart failure: Secondary | ICD-10-CM | POA: Diagnosis not present

## 2015-07-28 DIAGNOSIS — Z7901 Long term (current) use of anticoagulants: Secondary | ICD-10-CM | POA: Diagnosis not present

## 2015-07-28 DIAGNOSIS — Z9981 Dependence on supplemental oxygen: Secondary | ICD-10-CM | POA: Diagnosis not present

## 2015-07-28 DIAGNOSIS — J9612 Chronic respiratory failure with hypercapnia: Secondary | ICD-10-CM | POA: Diagnosis not present

## 2015-07-28 DIAGNOSIS — I480 Paroxysmal atrial fibrillation: Secondary | ICD-10-CM | POA: Diagnosis not present

## 2015-07-28 DIAGNOSIS — I130 Hypertensive heart and chronic kidney disease with heart failure and stage 1 through stage 4 chronic kidney disease, or unspecified chronic kidney disease: Secondary | ICD-10-CM | POA: Diagnosis not present

## 2015-07-28 DIAGNOSIS — I2510 Atherosclerotic heart disease of native coronary artery without angina pectoris: Secondary | ICD-10-CM | POA: Diagnosis not present

## 2015-07-29 DIAGNOSIS — I110 Hypertensive heart disease with heart failure: Secondary | ICD-10-CM | POA: Diagnosis not present

## 2015-07-29 DIAGNOSIS — J188 Other pneumonia, unspecified organism: Secondary | ICD-10-CM | POA: Diagnosis not present

## 2015-07-29 DIAGNOSIS — I10 Essential (primary) hypertension: Secondary | ICD-10-CM | POA: Diagnosis not present

## 2015-07-29 DIAGNOSIS — J449 Chronic obstructive pulmonary disease, unspecified: Secondary | ICD-10-CM | POA: Diagnosis not present

## 2015-08-01 ENCOUNTER — Other Ambulatory Visit (HOSPITAL_COMMUNITY): Payer: Self-pay | Admitting: Pulmonary Disease

## 2015-08-01 ENCOUNTER — Ambulatory Visit (HOSPITAL_COMMUNITY)
Admission: RE | Admit: 2015-08-01 | Discharge: 2015-08-01 | Disposition: A | Discharge status: Home / Self Care | Payer: Medicare Other | Source: Ambulatory Visit | Attending: Pulmonary Disease | Admitting: Pulmonary Disease

## 2015-08-01 DIAGNOSIS — E1122 Type 2 diabetes mellitus with diabetic chronic kidney disease: Secondary | ICD-10-CM | POA: Diagnosis not present

## 2015-08-01 DIAGNOSIS — J9611 Chronic respiratory failure with hypoxia: Secondary | ICD-10-CM | POA: Diagnosis not present

## 2015-08-01 DIAGNOSIS — J441 Chronic obstructive pulmonary disease with (acute) exacerbation: Secondary | ICD-10-CM | POA: Diagnosis not present

## 2015-08-01 DIAGNOSIS — I480 Paroxysmal atrial fibrillation: Secondary | ICD-10-CM | POA: Diagnosis not present

## 2015-08-01 DIAGNOSIS — Z7901 Long term (current) use of anticoagulants: Secondary | ICD-10-CM | POA: Diagnosis not present

## 2015-08-01 DIAGNOSIS — J449 Chronic obstructive pulmonary disease, unspecified: Secondary | ICD-10-CM | POA: Diagnosis not present

## 2015-08-01 DIAGNOSIS — R05 Cough: Secondary | ICD-10-CM

## 2015-08-01 DIAGNOSIS — J9612 Chronic respiratory failure with hypercapnia: Secondary | ICD-10-CM | POA: Diagnosis not present

## 2015-08-01 DIAGNOSIS — I5032 Chronic diastolic (congestive) heart failure: Secondary | ICD-10-CM | POA: Diagnosis not present

## 2015-08-01 DIAGNOSIS — N189 Chronic kidney disease, unspecified: Secondary | ICD-10-CM | POA: Diagnosis not present

## 2015-08-01 DIAGNOSIS — R0602 Shortness of breath: Secondary | ICD-10-CM | POA: Diagnosis not present

## 2015-08-01 DIAGNOSIS — R059 Cough, unspecified: Secondary | ICD-10-CM

## 2015-08-01 DIAGNOSIS — Z9981 Dependence on supplemental oxygen: Secondary | ICD-10-CM | POA: Diagnosis not present

## 2015-08-01 DIAGNOSIS — I2510 Atherosclerotic heart disease of native coronary artery without angina pectoris: Secondary | ICD-10-CM | POA: Diagnosis not present

## 2015-08-01 DIAGNOSIS — I130 Hypertensive heart and chronic kidney disease with heart failure and stage 1 through stage 4 chronic kidney disease, or unspecified chronic kidney disease: Secondary | ICD-10-CM | POA: Diagnosis not present

## 2015-08-03 DIAGNOSIS — J9611 Chronic respiratory failure with hypoxia: Secondary | ICD-10-CM | POA: Diagnosis not present

## 2015-08-03 DIAGNOSIS — E1122 Type 2 diabetes mellitus with diabetic chronic kidney disease: Secondary | ICD-10-CM | POA: Diagnosis not present

## 2015-08-03 DIAGNOSIS — I5032 Chronic diastolic (congestive) heart failure: Secondary | ICD-10-CM | POA: Diagnosis not present

## 2015-08-03 DIAGNOSIS — J441 Chronic obstructive pulmonary disease with (acute) exacerbation: Secondary | ICD-10-CM | POA: Diagnosis not present

## 2015-08-03 DIAGNOSIS — I130 Hypertensive heart and chronic kidney disease with heart failure and stage 1 through stage 4 chronic kidney disease, or unspecified chronic kidney disease: Secondary | ICD-10-CM | POA: Diagnosis not present

## 2015-08-03 DIAGNOSIS — Z9981 Dependence on supplemental oxygen: Secondary | ICD-10-CM | POA: Diagnosis not present

## 2015-08-03 DIAGNOSIS — I480 Paroxysmal atrial fibrillation: Secondary | ICD-10-CM | POA: Diagnosis not present

## 2015-08-03 DIAGNOSIS — J9612 Chronic respiratory failure with hypercapnia: Secondary | ICD-10-CM | POA: Diagnosis not present

## 2015-08-03 DIAGNOSIS — I2510 Atherosclerotic heart disease of native coronary artery without angina pectoris: Secondary | ICD-10-CM | POA: Diagnosis not present

## 2015-08-03 DIAGNOSIS — Z7901 Long term (current) use of anticoagulants: Secondary | ICD-10-CM | POA: Diagnosis not present

## 2015-08-03 DIAGNOSIS — N189 Chronic kidney disease, unspecified: Secondary | ICD-10-CM | POA: Diagnosis not present

## 2015-08-04 ENCOUNTER — Other Ambulatory Visit: Payer: Self-pay | Admitting: Cardiology

## 2015-08-04 DIAGNOSIS — Z7901 Long term (current) use of anticoagulants: Secondary | ICD-10-CM | POA: Diagnosis not present

## 2015-08-04 DIAGNOSIS — N189 Chronic kidney disease, unspecified: Secondary | ICD-10-CM | POA: Diagnosis not present

## 2015-08-04 DIAGNOSIS — I130 Hypertensive heart and chronic kidney disease with heart failure and stage 1 through stage 4 chronic kidney disease, or unspecified chronic kidney disease: Secondary | ICD-10-CM | POA: Diagnosis not present

## 2015-08-04 DIAGNOSIS — I2510 Atherosclerotic heart disease of native coronary artery without angina pectoris: Secondary | ICD-10-CM | POA: Diagnosis not present

## 2015-08-04 DIAGNOSIS — J9611 Chronic respiratory failure with hypoxia: Secondary | ICD-10-CM | POA: Diagnosis not present

## 2015-08-04 DIAGNOSIS — J9612 Chronic respiratory failure with hypercapnia: Secondary | ICD-10-CM | POA: Diagnosis not present

## 2015-08-04 DIAGNOSIS — E1122 Type 2 diabetes mellitus with diabetic chronic kidney disease: Secondary | ICD-10-CM | POA: Diagnosis not present

## 2015-08-04 DIAGNOSIS — I5032 Chronic diastolic (congestive) heart failure: Secondary | ICD-10-CM | POA: Diagnosis not present

## 2015-08-04 DIAGNOSIS — Z9981 Dependence on supplemental oxygen: Secondary | ICD-10-CM | POA: Diagnosis not present

## 2015-08-04 DIAGNOSIS — J441 Chronic obstructive pulmonary disease with (acute) exacerbation: Secondary | ICD-10-CM | POA: Diagnosis not present

## 2015-08-04 DIAGNOSIS — I480 Paroxysmal atrial fibrillation: Secondary | ICD-10-CM | POA: Diagnosis not present

## 2015-08-04 NOTE — Telephone Encounter (Signed)
Potassium refilled eliquis routed to CVRR team

## 2015-08-05 DIAGNOSIS — E1122 Type 2 diabetes mellitus with diabetic chronic kidney disease: Secondary | ICD-10-CM | POA: Diagnosis not present

## 2015-08-05 DIAGNOSIS — I480 Paroxysmal atrial fibrillation: Secondary | ICD-10-CM | POA: Diagnosis not present

## 2015-08-05 DIAGNOSIS — J441 Chronic obstructive pulmonary disease with (acute) exacerbation: Secondary | ICD-10-CM | POA: Diagnosis not present

## 2015-08-05 DIAGNOSIS — Z7901 Long term (current) use of anticoagulants: Secondary | ICD-10-CM | POA: Diagnosis not present

## 2015-08-05 DIAGNOSIS — J9611 Chronic respiratory failure with hypoxia: Secondary | ICD-10-CM | POA: Diagnosis not present

## 2015-08-05 DIAGNOSIS — I5032 Chronic diastolic (congestive) heart failure: Secondary | ICD-10-CM | POA: Diagnosis not present

## 2015-08-05 DIAGNOSIS — I2510 Atherosclerotic heart disease of native coronary artery without angina pectoris: Secondary | ICD-10-CM | POA: Diagnosis not present

## 2015-08-05 DIAGNOSIS — I130 Hypertensive heart and chronic kidney disease with heart failure and stage 1 through stage 4 chronic kidney disease, or unspecified chronic kidney disease: Secondary | ICD-10-CM | POA: Diagnosis not present

## 2015-08-05 DIAGNOSIS — N189 Chronic kidney disease, unspecified: Secondary | ICD-10-CM | POA: Diagnosis not present

## 2015-08-05 DIAGNOSIS — Z9981 Dependence on supplemental oxygen: Secondary | ICD-10-CM | POA: Diagnosis not present

## 2015-08-05 DIAGNOSIS — J9612 Chronic respiratory failure with hypercapnia: Secondary | ICD-10-CM | POA: Diagnosis not present

## 2015-08-09 DIAGNOSIS — I130 Hypertensive heart and chronic kidney disease with heart failure and stage 1 through stage 4 chronic kidney disease, or unspecified chronic kidney disease: Secondary | ICD-10-CM | POA: Diagnosis not present

## 2015-08-09 DIAGNOSIS — Z9981 Dependence on supplemental oxygen: Secondary | ICD-10-CM | POA: Diagnosis not present

## 2015-08-09 DIAGNOSIS — I480 Paroxysmal atrial fibrillation: Secondary | ICD-10-CM | POA: Diagnosis not present

## 2015-08-09 DIAGNOSIS — J441 Chronic obstructive pulmonary disease with (acute) exacerbation: Secondary | ICD-10-CM | POA: Diagnosis not present

## 2015-08-09 DIAGNOSIS — N189 Chronic kidney disease, unspecified: Secondary | ICD-10-CM | POA: Diagnosis not present

## 2015-08-09 DIAGNOSIS — Z7901 Long term (current) use of anticoagulants: Secondary | ICD-10-CM | POA: Diagnosis not present

## 2015-08-09 DIAGNOSIS — J9611 Chronic respiratory failure with hypoxia: Secondary | ICD-10-CM | POA: Diagnosis not present

## 2015-08-09 DIAGNOSIS — E1122 Type 2 diabetes mellitus with diabetic chronic kidney disease: Secondary | ICD-10-CM | POA: Diagnosis not present

## 2015-08-09 DIAGNOSIS — I2510 Atherosclerotic heart disease of native coronary artery without angina pectoris: Secondary | ICD-10-CM | POA: Diagnosis not present

## 2015-08-09 DIAGNOSIS — I5032 Chronic diastolic (congestive) heart failure: Secondary | ICD-10-CM | POA: Diagnosis not present

## 2015-08-09 DIAGNOSIS — J9612 Chronic respiratory failure with hypercapnia: Secondary | ICD-10-CM | POA: Diagnosis not present

## 2015-08-10 DIAGNOSIS — Z9981 Dependence on supplemental oxygen: Secondary | ICD-10-CM | POA: Diagnosis not present

## 2015-08-10 DIAGNOSIS — I2510 Atherosclerotic heart disease of native coronary artery without angina pectoris: Secondary | ICD-10-CM | POA: Diagnosis not present

## 2015-08-10 DIAGNOSIS — J9612 Chronic respiratory failure with hypercapnia: Secondary | ICD-10-CM | POA: Diagnosis not present

## 2015-08-10 DIAGNOSIS — E1122 Type 2 diabetes mellitus with diabetic chronic kidney disease: Secondary | ICD-10-CM | POA: Diagnosis not present

## 2015-08-10 DIAGNOSIS — I5032 Chronic diastolic (congestive) heart failure: Secondary | ICD-10-CM | POA: Diagnosis not present

## 2015-08-10 DIAGNOSIS — I130 Hypertensive heart and chronic kidney disease with heart failure and stage 1 through stage 4 chronic kidney disease, or unspecified chronic kidney disease: Secondary | ICD-10-CM | POA: Diagnosis not present

## 2015-08-10 DIAGNOSIS — Z7901 Long term (current) use of anticoagulants: Secondary | ICD-10-CM | POA: Diagnosis not present

## 2015-08-10 DIAGNOSIS — J441 Chronic obstructive pulmonary disease with (acute) exacerbation: Secondary | ICD-10-CM | POA: Diagnosis not present

## 2015-08-10 DIAGNOSIS — J9611 Chronic respiratory failure with hypoxia: Secondary | ICD-10-CM | POA: Diagnosis not present

## 2015-08-10 DIAGNOSIS — N189 Chronic kidney disease, unspecified: Secondary | ICD-10-CM | POA: Diagnosis not present

## 2015-08-10 DIAGNOSIS — I480 Paroxysmal atrial fibrillation: Secondary | ICD-10-CM | POA: Diagnosis not present

## 2015-08-11 DIAGNOSIS — J9612 Chronic respiratory failure with hypercapnia: Secondary | ICD-10-CM | POA: Diagnosis not present

## 2015-08-11 DIAGNOSIS — I5032 Chronic diastolic (congestive) heart failure: Secondary | ICD-10-CM | POA: Diagnosis not present

## 2015-08-11 DIAGNOSIS — I130 Hypertensive heart and chronic kidney disease with heart failure and stage 1 through stage 4 chronic kidney disease, or unspecified chronic kidney disease: Secondary | ICD-10-CM | POA: Diagnosis not present

## 2015-08-11 DIAGNOSIS — N189 Chronic kidney disease, unspecified: Secondary | ICD-10-CM | POA: Diagnosis not present

## 2015-08-11 DIAGNOSIS — J441 Chronic obstructive pulmonary disease with (acute) exacerbation: Secondary | ICD-10-CM | POA: Diagnosis not present

## 2015-08-11 DIAGNOSIS — I2510 Atherosclerotic heart disease of native coronary artery without angina pectoris: Secondary | ICD-10-CM | POA: Diagnosis not present

## 2015-08-11 DIAGNOSIS — J9611 Chronic respiratory failure with hypoxia: Secondary | ICD-10-CM | POA: Diagnosis not present

## 2015-08-11 DIAGNOSIS — E1122 Type 2 diabetes mellitus with diabetic chronic kidney disease: Secondary | ICD-10-CM | POA: Diagnosis not present

## 2015-08-11 DIAGNOSIS — Z9981 Dependence on supplemental oxygen: Secondary | ICD-10-CM | POA: Diagnosis not present

## 2015-08-11 DIAGNOSIS — Z7901 Long term (current) use of anticoagulants: Secondary | ICD-10-CM | POA: Diagnosis not present

## 2015-08-11 DIAGNOSIS — I480 Paroxysmal atrial fibrillation: Secondary | ICD-10-CM | POA: Diagnosis not present

## 2015-08-12 DIAGNOSIS — I130 Hypertensive heart and chronic kidney disease with heart failure and stage 1 through stage 4 chronic kidney disease, or unspecified chronic kidney disease: Secondary | ICD-10-CM | POA: Diagnosis not present

## 2015-08-12 DIAGNOSIS — J9612 Chronic respiratory failure with hypercapnia: Secondary | ICD-10-CM | POA: Diagnosis not present

## 2015-08-12 DIAGNOSIS — N189 Chronic kidney disease, unspecified: Secondary | ICD-10-CM | POA: Diagnosis not present

## 2015-08-12 DIAGNOSIS — I2510 Atherosclerotic heart disease of native coronary artery without angina pectoris: Secondary | ICD-10-CM | POA: Diagnosis not present

## 2015-08-12 DIAGNOSIS — J9611 Chronic respiratory failure with hypoxia: Secondary | ICD-10-CM | POA: Diagnosis not present

## 2015-08-12 DIAGNOSIS — E1122 Type 2 diabetes mellitus with diabetic chronic kidney disease: Secondary | ICD-10-CM | POA: Diagnosis not present

## 2015-08-12 DIAGNOSIS — Z9981 Dependence on supplemental oxygen: Secondary | ICD-10-CM | POA: Diagnosis not present

## 2015-08-12 DIAGNOSIS — J441 Chronic obstructive pulmonary disease with (acute) exacerbation: Secondary | ICD-10-CM | POA: Diagnosis not present

## 2015-08-12 DIAGNOSIS — I480 Paroxysmal atrial fibrillation: Secondary | ICD-10-CM | POA: Diagnosis not present

## 2015-08-12 DIAGNOSIS — Z7901 Long term (current) use of anticoagulants: Secondary | ICD-10-CM | POA: Diagnosis not present

## 2015-08-12 DIAGNOSIS — I5032 Chronic diastolic (congestive) heart failure: Secondary | ICD-10-CM | POA: Diagnosis not present

## 2015-08-16 DIAGNOSIS — J9611 Chronic respiratory failure with hypoxia: Secondary | ICD-10-CM | POA: Diagnosis not present

## 2015-08-16 DIAGNOSIS — Z7901 Long term (current) use of anticoagulants: Secondary | ICD-10-CM | POA: Diagnosis not present

## 2015-08-16 DIAGNOSIS — J441 Chronic obstructive pulmonary disease with (acute) exacerbation: Secondary | ICD-10-CM | POA: Diagnosis not present

## 2015-08-16 DIAGNOSIS — I480 Paroxysmal atrial fibrillation: Secondary | ICD-10-CM | POA: Diagnosis not present

## 2015-08-16 DIAGNOSIS — J9612 Chronic respiratory failure with hypercapnia: Secondary | ICD-10-CM | POA: Diagnosis not present

## 2015-08-16 DIAGNOSIS — N189 Chronic kidney disease, unspecified: Secondary | ICD-10-CM | POA: Diagnosis not present

## 2015-08-16 DIAGNOSIS — Z9981 Dependence on supplemental oxygen: Secondary | ICD-10-CM | POA: Diagnosis not present

## 2015-08-16 DIAGNOSIS — E1122 Type 2 diabetes mellitus with diabetic chronic kidney disease: Secondary | ICD-10-CM | POA: Diagnosis not present

## 2015-08-16 DIAGNOSIS — I2510 Atherosclerotic heart disease of native coronary artery without angina pectoris: Secondary | ICD-10-CM | POA: Diagnosis not present

## 2015-08-16 DIAGNOSIS — I5032 Chronic diastolic (congestive) heart failure: Secondary | ICD-10-CM | POA: Diagnosis not present

## 2015-08-16 DIAGNOSIS — I130 Hypertensive heart and chronic kidney disease with heart failure and stage 1 through stage 4 chronic kidney disease, or unspecified chronic kidney disease: Secondary | ICD-10-CM | POA: Diagnosis not present

## 2015-08-18 DIAGNOSIS — I2510 Atherosclerotic heart disease of native coronary artery without angina pectoris: Secondary | ICD-10-CM | POA: Diagnosis not present

## 2015-08-18 DIAGNOSIS — I480 Paroxysmal atrial fibrillation: Secondary | ICD-10-CM | POA: Diagnosis not present

## 2015-08-18 DIAGNOSIS — Z9981 Dependence on supplemental oxygen: Secondary | ICD-10-CM | POA: Diagnosis not present

## 2015-08-18 DIAGNOSIS — E1122 Type 2 diabetes mellitus with diabetic chronic kidney disease: Secondary | ICD-10-CM | POA: Diagnosis not present

## 2015-08-18 DIAGNOSIS — Z7901 Long term (current) use of anticoagulants: Secondary | ICD-10-CM | POA: Diagnosis not present

## 2015-08-18 DIAGNOSIS — N189 Chronic kidney disease, unspecified: Secondary | ICD-10-CM | POA: Diagnosis not present

## 2015-08-18 DIAGNOSIS — I5032 Chronic diastolic (congestive) heart failure: Secondary | ICD-10-CM | POA: Diagnosis not present

## 2015-08-18 DIAGNOSIS — I130 Hypertensive heart and chronic kidney disease with heart failure and stage 1 through stage 4 chronic kidney disease, or unspecified chronic kidney disease: Secondary | ICD-10-CM | POA: Diagnosis not present

## 2015-08-18 DIAGNOSIS — J9612 Chronic respiratory failure with hypercapnia: Secondary | ICD-10-CM | POA: Diagnosis not present

## 2015-08-18 DIAGNOSIS — J9611 Chronic respiratory failure with hypoxia: Secondary | ICD-10-CM | POA: Diagnosis not present

## 2015-08-18 DIAGNOSIS — J441 Chronic obstructive pulmonary disease with (acute) exacerbation: Secondary | ICD-10-CM | POA: Diagnosis not present

## 2015-08-19 DIAGNOSIS — J9612 Chronic respiratory failure with hypercapnia: Secondary | ICD-10-CM | POA: Diagnosis not present

## 2015-08-19 DIAGNOSIS — J441 Chronic obstructive pulmonary disease with (acute) exacerbation: Secondary | ICD-10-CM | POA: Diagnosis not present

## 2015-08-19 DIAGNOSIS — Z9981 Dependence on supplemental oxygen: Secondary | ICD-10-CM | POA: Diagnosis not present

## 2015-08-19 DIAGNOSIS — Z7901 Long term (current) use of anticoagulants: Secondary | ICD-10-CM | POA: Diagnosis not present

## 2015-08-19 DIAGNOSIS — I130 Hypertensive heart and chronic kidney disease with heart failure and stage 1 through stage 4 chronic kidney disease, or unspecified chronic kidney disease: Secondary | ICD-10-CM | POA: Diagnosis not present

## 2015-08-19 DIAGNOSIS — J9611 Chronic respiratory failure with hypoxia: Secondary | ICD-10-CM | POA: Diagnosis not present

## 2015-08-19 DIAGNOSIS — I480 Paroxysmal atrial fibrillation: Secondary | ICD-10-CM | POA: Diagnosis not present

## 2015-08-19 DIAGNOSIS — N189 Chronic kidney disease, unspecified: Secondary | ICD-10-CM | POA: Diagnosis not present

## 2015-08-19 DIAGNOSIS — I5032 Chronic diastolic (congestive) heart failure: Secondary | ICD-10-CM | POA: Diagnosis not present

## 2015-08-19 DIAGNOSIS — E1122 Type 2 diabetes mellitus with diabetic chronic kidney disease: Secondary | ICD-10-CM | POA: Diagnosis not present

## 2015-08-19 DIAGNOSIS — I2510 Atherosclerotic heart disease of native coronary artery without angina pectoris: Secondary | ICD-10-CM | POA: Diagnosis not present

## 2015-08-25 DIAGNOSIS — J449 Chronic obstructive pulmonary disease, unspecified: Secondary | ICD-10-CM | POA: Diagnosis not present

## 2015-09-05 ENCOUNTER — Other Ambulatory Visit: Payer: Self-pay | Admitting: Cardiology

## 2015-09-05 ENCOUNTER — Other Ambulatory Visit: Payer: Self-pay | Admitting: Cardiovascular Disease

## 2015-09-23 DIAGNOSIS — J9611 Chronic respiratory failure with hypoxia: Secondary | ICD-10-CM | POA: Diagnosis not present

## 2015-09-23 DIAGNOSIS — I4891 Unspecified atrial fibrillation: Secondary | ICD-10-CM | POA: Diagnosis not present

## 2015-09-23 DIAGNOSIS — J449 Chronic obstructive pulmonary disease, unspecified: Secondary | ICD-10-CM | POA: Diagnosis not present

## 2015-09-23 DIAGNOSIS — I10 Essential (primary) hypertension: Secondary | ICD-10-CM | POA: Diagnosis not present

## 2015-09-25 DIAGNOSIS — J449 Chronic obstructive pulmonary disease, unspecified: Secondary | ICD-10-CM | POA: Diagnosis not present

## 2015-10-26 DIAGNOSIS — J449 Chronic obstructive pulmonary disease, unspecified: Secondary | ICD-10-CM | POA: Diagnosis not present

## 2015-11-02 ENCOUNTER — Other Ambulatory Visit: Payer: Self-pay | Admitting: Cardiology

## 2015-11-17 ENCOUNTER — Telehealth: Payer: Self-pay | Admitting: Cardiology

## 2015-11-17 NOTE — Telephone Encounter (Signed)
SPOKE TO PATIENT   RN ASKED IF LIPID HAVE BEEN CHECKED THIS YEAR BY PRIMARY  DR Gerarda Fraction PATIENT STATE HE WAS NOT SURE BU PATIENT WAS GOING TO PRIMARY ON Thursday  FOR FLU INJECTION. RN INFORMED PATIENT ( NOT EAT OR DRINK THAT MORNING)TO HAVE PRIMARY TO DRAW LABS ALONG WITH OTHERS AND SEND A COPY TO DR HARDING PATIENT VERBALIZED UNDERSTANDING.

## 2015-11-17 NOTE — Telephone Encounter (Signed)
Pt would like to know if he needs lab work before his 01-02-16 appt?

## 2015-11-23 DIAGNOSIS — E782 Mixed hyperlipidemia: Secondary | ICD-10-CM | POA: Diagnosis not present

## 2015-11-23 DIAGNOSIS — J449 Chronic obstructive pulmonary disease, unspecified: Secondary | ICD-10-CM | POA: Diagnosis not present

## 2015-11-23 DIAGNOSIS — I4891 Unspecified atrial fibrillation: Secondary | ICD-10-CM | POA: Diagnosis not present

## 2015-11-23 DIAGNOSIS — Z1389 Encounter for screening for other disorder: Secondary | ICD-10-CM | POA: Diagnosis not present

## 2015-11-23 DIAGNOSIS — Z23 Encounter for immunization: Secondary | ICD-10-CM | POA: Diagnosis not present

## 2015-11-23 DIAGNOSIS — Z79899 Other long term (current) drug therapy: Secondary | ICD-10-CM | POA: Diagnosis not present

## 2015-11-23 DIAGNOSIS — I10 Essential (primary) hypertension: Secondary | ICD-10-CM | POA: Diagnosis not present

## 2015-11-25 DIAGNOSIS — J449 Chronic obstructive pulmonary disease, unspecified: Secondary | ICD-10-CM | POA: Diagnosis not present

## 2015-12-09 DIAGNOSIS — D649 Anemia, unspecified: Secondary | ICD-10-CM | POA: Diagnosis not present

## 2015-12-26 DIAGNOSIS — J449 Chronic obstructive pulmonary disease, unspecified: Secondary | ICD-10-CM | POA: Diagnosis not present

## 2015-12-29 DIAGNOSIS — I10 Essential (primary) hypertension: Secondary | ICD-10-CM | POA: Diagnosis not present

## 2015-12-29 DIAGNOSIS — J449 Chronic obstructive pulmonary disease, unspecified: Secondary | ICD-10-CM | POA: Diagnosis not present

## 2015-12-29 DIAGNOSIS — J9611 Chronic respiratory failure with hypoxia: Secondary | ICD-10-CM | POA: Diagnosis not present

## 2015-12-29 DIAGNOSIS — I2510 Atherosclerotic heart disease of native coronary artery without angina pectoris: Secondary | ICD-10-CM | POA: Diagnosis not present

## 2016-01-02 ENCOUNTER — Ambulatory Visit (INDEPENDENT_AMBULATORY_CARE_PROVIDER_SITE_OTHER): Payer: Medicare Other | Admitting: Cardiology

## 2016-01-02 ENCOUNTER — Encounter: Payer: Self-pay | Admitting: Cardiology

## 2016-01-02 ENCOUNTER — Other Ambulatory Visit: Payer: Self-pay | Admitting: Cardiology

## 2016-01-02 VITALS — BP 128/74 | HR 80 | Ht 71.0 in | Wt 232.0 lb

## 2016-01-02 DIAGNOSIS — I2510 Atherosclerotic heart disease of native coronary artery without angina pectoris: Secondary | ICD-10-CM | POA: Diagnosis not present

## 2016-01-02 DIAGNOSIS — E785 Hyperlipidemia, unspecified: Secondary | ICD-10-CM | POA: Diagnosis not present

## 2016-01-02 DIAGNOSIS — I255 Ischemic cardiomyopathy: Secondary | ICD-10-CM | POA: Diagnosis not present

## 2016-01-02 DIAGNOSIS — Z9861 Coronary angioplasty status: Secondary | ICD-10-CM

## 2016-01-02 DIAGNOSIS — E669 Obesity, unspecified: Secondary | ICD-10-CM

## 2016-01-02 DIAGNOSIS — I480 Paroxysmal atrial fibrillation: Secondary | ICD-10-CM

## 2016-01-02 DIAGNOSIS — I10 Essential (primary) hypertension: Secondary | ICD-10-CM

## 2016-01-02 DIAGNOSIS — I5032 Chronic diastolic (congestive) heart failure: Secondary | ICD-10-CM | POA: Diagnosis not present

## 2016-01-02 DIAGNOSIS — E66811 Obesity, class 1: Secondary | ICD-10-CM

## 2016-01-02 DIAGNOSIS — R0609 Other forms of dyspnea: Secondary | ICD-10-CM

## 2016-01-02 NOTE — Patient Instructions (Signed)
HOLD Clopidogrel until primary doctor does anemia evaluation and determine the cause of your anemia.  NO OTHER CHANGES   Your physician wants you to follow-up in:6 MONTHS WITH DR HARDING.- 30 MIN You will receive a reminder letter in the mail two months in advance. If you don't receive a letter, please call our office to schedule the follow-up appointment.   If you need a refill on your cardiac medications before your next appointment, please call your pharmacy.

## 2016-01-02 NOTE — Progress Notes (Signed)
PCP: [name], MD  Clinic Note: Chief Complaint  Patient presents with  . Follow-up    6 mo follow up  . Coronary Artery Disease    History of inferior MI status post PCI to RCA    HPI: Benjamin [name] is a 60 y.o. male with a PMH below who presents today for six-month follow-up of his CAD with history of inferior MI. He has chronic ischemic cardiomyopathy with chronic combined systolic and diastolic heart failure and long-standing COPD. He wears 4 L home oxygen. On Eliquis for anticoagulation  Inferior STEMI October 2014 very large RCA occluded, cardiac shock and atrial fibrillation requiring cardioversion.  PCL very large BMS stent 5 x 24 mm postdilated to 5.5 mm  Moderate Ischemic Cardiomyopathy with EF of 40-45% --> improved to 55-60%  CASYN TORRENCE was last seen in June 2017. No medication changes  Recent Hospitalizations: None since April 2017  Studies Reviewed: None since April 2017 echo  Normal EF: 55-60%. Pericardial fat versus pericardial effusion. No tamponade  Interval History: [name] exophytically well from our perspective. He denies any recurrent chest tightness pressure rest or exertion. He has baseline dyspnea but nothing changes. Avoided illnesses so far. One thing he does note is: No stamina to do anything -- hoping to do Pulm Rehab. He says that his edema is pretty well-controlled and he only takes an additional dose of Lasix 1-2 times per week, usually doubling up the morning dose.  He apparently was evaluated for anemia by his PCP and is waiting for the results. He denies any melena, hematochezia or hematuria. No PND, orthopnea or edema.  No palpitations, lightheadedness, dizziness, weakness or syncope/near syncope. No TIA/amaurosis fugax symptoms. No melena, hematochezia, hematuria, or epstaxis. No claudication.  ROS: A comprehensive was performed. Review of Systems  Constitutional: Positive for malaise/fatigue (no Energy to exercise -  but scared to do non-supervised). Negative for chills and fever.  HENT: Positive for congestion (chronic). Negative for nosebleeds and sinus pain.   Eyes: Negative for blurred vision.  Respiratory: Positive for cough, shortness of breath and wheezing. Negative for sputum production.   Cardiovascular: Negative for claudication.       Per history of present illness  Gastrointestinal: Negative for blood in stool and melena.  Genitourinary: Negative for hematuria.  Musculoskeletal: Positive for back pain.  Neurological: Negative for dizziness and headaches.  Endo/Heme/Allergies: Bruises/bleeds easily.  Psychiatric/Behavioral: The patient is nervous/anxious and has insomnia (troubled with anxiety; hard to stay asleep).   All other systems reviewed and are negative.   Past Medical History:  Diagnosis Date  . Atrial fibrillation with RVR - peri-MI; recurrent after DCCV 11/19/2012  . CAD S/P percutaneous coronary angioplasty  11/18/2012   - PCI mid RCA - 5.0 mm x 24 mm VeriFlex BMS (5.6 mm)  . Chronic combined systolic and diastolic heart failure, NYHA class 2 ([place]) 11/17/2012  . Colon polyp    Status post polypectomy x3.  [name] COPD (chronic obstructive pulmonary disease) ([place])   . Dyslipidemia- low HDL 11/19/2012  . Ischemic cardiomyopathy 11/19/2012   EF 40-45% with inferoseptal HK, Gr 2DD - high LVEDP/LAP. --> confirmed by f/u Echo 03/2013.  [name] STEMI of inferior wall 11/18/12- RCA BMS 11/17/2012   Cardiogenic shock - post MI with RV infarction [785.51]  . Tobacco abuse    40 Pk-yr (1- 1 1/2 PPD) --> Quit 11/18/2012 when presented with STEMI    Past Surgical History:  Procedure Laterality Date  . COLONOSCOPY N/A  11/20/2013   Procedure: COLONOSCOPY;  Surgeon: Jamesetta So, MD;  Location: [facility];  Service: Gastroenterology;  Laterality: N/A;  . CORONARY ANGIOPLASTY WITH STENT PLACEMENT Right 11/17/12   Mid RCA aspiration thrombectomy and PCI with VeriFlex BMS 5.0 mm x 24 mm (5.6 mm;  also PTCA of distal RPL 4 distal embolization.  . Dental extractions    . LEFT HEART CATHETERIZATION WITH CORONARY ANGIOGRAM N/A 11/17/2012   Procedure: LEFT HEART CATHETERIZATION WITH CORONARY ANGIOGRAM;  Surgeon: Leonie Man, MD;  Location: [facility] CATH LAB;  Service: Cardiovascular;  Laterality: N/A;  . TRANSTHORACIC ECHOCARDIOGRAM  11/18/2012   Normal LV size, moderate reduced function 40-45% with severe HK-AK of the inferoseptal wall with incoordinate septal motion.  Domingo Dimes ECHOCARDIOGRAM  March 15   Moderately reduced LVEF of 40 at 45% with inferoseptal hypokinesis and high filling pressures.  . TRANSTHORACIC ECHOCARDIOGRAM  04/2015   EF 55-60%.; Pericardial fat pad versus effusion; no tamponade    Current Meds  Medication Sig  . acetaminophen (TYLENOL) 500 MG tablet Take by mouth every 6 (six) hours as needed for pain.  [name] albuterol (PROVENTIL HFA;VENTOLIN HFA) 108 (90 BASE) MCG/ACT inhaler Inhale 2 puffs into the lungs every 6 (six) hours as needed for wheezing.  [name] atorvastatin (LIPITOR) 40 MG tablet Take 1 tablet (40 mg total) by mouth daily.  . bisoprolol (ZEBETA) 5 MG tablet Take 0.5 tablets (2.5 mg total) by mouth daily.  . clopidogrel (PLAVIX) 75 MG tablet TAKE 1 TABLET BY MOUTH EVERY DAY  . Coenzyme Q10 (CO Q-10) 100 MG CAPS Take 300 mg by mouth daily.  [name] DALIRESP 500 MCG TABS tablet Take 500 mcg by mouth daily.   [name] Dextromethorphan-Guaifenesin (MUCINEX DM MAXIMUM STRENGTH PO) Take 1 tablet by mouth every 4 (four) hours as needed (congestion).   [name] ELIQUIS 5 MG TABS tablet TAKE ONE TABLET BY MOUTH TWICE DAILY  . feeding supplement, ENSURE ENLIVE, (ENSURE ENLIVE) LIQD Take 237 mLs by mouth 2 (two) times daily between meals.  . fluticasone (FLONASE) 50 MCG/ACT nasal spray Place 2 sprays into both nostrils daily.  . furosemide (LASIX) 40 MG tablet Take 1 tablet (40 mg total) by mouth daily.  [name] gabapentin (NEURONTIN) 300 MG capsule Take 600 mg by mouth 4 (four) times daily.    [name] levalbuterol (XOPENEX) 0.63 MG/3ML nebulizer solution Take 3 mLs (0.63 mg total) by nebulization every 3 (three) hours as needed for wheezing or shortness of breath.  . losartan (COZAAR) 50 MG tablet Take 1 tablet (50 mg total) by mouth daily.  . nitroGLYCERIN (NITROSTAT) 0.4 MG SL tablet Place 1 tablet (0.4 mg total) under the tongue every 5 (five) minutes x 3 doses as needed for chest pain.  Donell Sievert IN Inhale into the lungs. Using 4 liters nasal cannula ,continuous  . potassium chloride (K-DUR) 10 MEQ tablet Take 10 mEq by mouth daily.  . sodium chloride (OCEAN) 0.65 % SOLN nasal spray Place 1 spray into both nostrils as needed for congestion.  [name] SPIRIVA HANDIHALER 18 MCG inhalation capsule Place 18 mcg into inhaler and inhale daily.   [name] sulfamethoxazole-trimethoprim (BACTRIM DS,SEPTRA DS) 800-160 MG tablet Take 1 tablet by mouth 2 (two) times daily as needed (for sickness).   . SYMBICORT 160-4.5 MCG/ACT inhaler Inhale 2 puffs into the lungs 2 (two) times daily.    Allergies  Allergen Reactions  . Codeine     Red blotches  . Penicillins     Childhood reaction  Social History   Social History  . Marital status: Divorced    Spouse name: N/A  . Number of children: N/A  . Years of education: N/A   Occupational History  . Disabled secondary to COPD    Social History Main Topics  . Smoking status: Former Smoker    Packs/day: 1.00    Years: 40.00  . Smokeless tobacco: Never Used     Comment: [place] 10/2102  . Alcohol use No  . Drug use: No  . Sexual activity: Not Asked   Other Topics Concern  . None   Social History Narrative   Divorced father of 2 (one daughter one son).   Lives alone, but his daughter who lives nearby is "watching him like a Engineer, drilling ".   He quit smoking during his hospitalization after a 40-pack-year smoking history.   He is now gradually building up his exercise level walking 6 minutes at a time several times a day. He  really a waiting Cardiac Rehabilitation.   Does not drink alcohol.   family history includes Cancer in his sister; Heart attack in his father and mother.  Wt Readings from Last 3 Encounters:  01/02/16 105.2 kg (232 lb)  07/05/15 95.1 kg (209 lb 9.6 oz)  06/05/15 95.7 kg (210 lb 15.7 oz)    PHYSICAL EXAM BP 128/74   Pulse 80   Ht 5\' 11"  (1.803 m)   Wt 105.2 kg (232 lb)   BMI 32.36 kg/m   General appearance: alert, cooperative, appears older than stated age, chronically ill appearing in mild distress. noticeable increased work of breathing.; on home oxygen and was breathing hard when walking into the clinic   Neck: mildlythe elevated JVP; supple no LAD and no carotid bruit.   Lungs: Diffusely reduced breath sounds bilateral lung bases to mid lung with crackles but no rales. Also diffuse wheezing noted. Normal respiratory for, but uses accessory muscles. He has increased AP diameter with prolonged expiratory phase with expiratory wheezing.   Heart: RRR, S1, S2 normal, no murmur, click, rub or gallop and normal apical impulse   Abdomen: soft, non-tender; bowel sounds normal; no masses, no organomegaly   Extremities: extremities normal, atraumatic, no cyanosis; 1-2+ bilateral LE edema; Pulses: 2+ and symmetric   Neurologic: Grossly normal   Adult ECG Report  Rate: 80 ;  Rhythm: normal sinus rhythm and Inferior infarct, age undetermined. Otherwise normal axis, intervals and durations.;   Narrative Interpretation: Essentially stable EKG the lateral infarct Q waves are no longer present  Other studies Reviewed: Additional studies/ records that were reviewed today include:  Recent Labs:  Lab Results  Component Value Date   CREATININE 0.87 06/03/2015   BUN 38 (H) 06/03/2015   NA 143 06/03/2015   K 4.6 06/03/2015   CL 94 (L) 06/03/2015   CO2 34 (H) 06/03/2015   Lab Results  Component Value Date   CHOL 168 07/23/2014   HDL 63 07/23/2014   LDLCALC 71 07/23/2014   TRIG 228 (H)  06/01/2015   CHOLHDL 2.7 07/23/2014  - checked @ [facility] ~1 month ago; working up anemia  ASSESSMENT / PLAN: Problem List Items Addressed This Visit    CAD S/P percutaneous coronary angioplasty - PCI mid RCA - 5.0 mm x 24 mm VeriFlex BMS (5.6 mm) - Primary (Chronic)    No active symptoms with 2 very large caliber stents in the RCA. Based on their size, unlikely to have in-stent stenosis or thrombosis that  will be significant. On Plavix, statin plus coenzyme Q 10 along with bisoprolol (beta 1 selective) and ARB.  Would continue Plavix for secondary prevention, but would be okay to stop for procedures.  It sounds like he is being evaluated for her anemia. I recommend that he hold his Plavix until his PCP is evaluate his anemia and we are sure that there is no sign of bleeding.      Relevant Orders   EKG 12-Lead (Completed)   Cardiomyopathy, ischemic - EF 40-45%; now up to 55-60% (Chronic)    EF improved to 55-60% as of the April 2017 echo. This is better than I would've expected based on the extent of RCA disease at the time of his MI. He actually isn't really acting like someone cardiomyopathy. I would suspect most of his dyspnea is related to COPD. Remains on stable regimen with Zebeta and losartan as well as standing dose of Lasix.      Relevant Orders   EKG 12-Lead (Completed)   Chronic diastolic HF (heart failure), NYHA class 2 (H[facility]) (Chronic)    Probably more now related to diastolic dysfunction then combined heart failure as his EF is back to normal. On stable dose of ARB and beta blocker. Taking standing dose of Lasix with occasional doses doubled up in the morning. Relatively stable      Relevant Orders   EKG 12-Lead (Completed)   Dyslipidemia, goal LDL below 70 (Chronic)    Relatively well-controlled on current dose of statin as of June last year. I don't have recent labs. They're checked at his PCPs office. Continues to be on atorvastatin 40 mg daily.      Relevant  Orders   EKG 12-Lead (Completed)   Paroxysmal Atrial fibrillation with RVR - peri-MI; recurrent after DCCV (Chronic)    No further recurrence. Remains on Eliquis for anticoagulation and beta blocker for rate control.  This patients CHA2DS2-VASc Score and unadjusted Ischemic Stroke Rate (% per year) is equal to 3.2 % stroke rate/year from a score of 3.      Relevant Orders   EKG 12-Lead (Completed)   Essential hypertension (Chronic)    Well-controlled on current regimen. Would not adjust further at this point.      Relevant Orders   EKG 12-Lead (Completed)   Obesity (BMI 30.0-34.9) (Chronic)    He had been doing fairly well with weight loss, is now put back on some weight, partly because he is limited as far as mobility goes with arthritis pains and dyspnea. He is hoping to try to get into pulmonary rehabilitation. We also talked about the importance of dietary modification.      Relevant Orders   EKG 12-Lead (Completed)   DOE (dyspnea on exertion) (Chronic)    Chronic is a combination of COPD, deconditioning and some diastolic dysfunction.  Continue beta blocker, ARB for afterload reduction as well as Lasix to treat diastolic dysfunction portion.      Relevant Orders   EKG 12-Lead (Completed)      Current medicines are reviewed at length with the patient today. (+/- concerns) None The following changes have been made: Stopped Plavix until seen by PCP to determine cause of anemia  Patient Instructions  HOLD Clopidogrel until primary doctor does anemia evaluation and determine the cause of your anemia.  NO OTHER CHANGES   Your physician wants you to follow-up in:6 MONTHS WITH DR HARDING.- 30 MIN You will receive a reminder letter in the mail two months in advance.  If you don't receive a letter, please call our office to schedule the follow-up appointment.   If you need a refill on your cardiac medications before your next appointment, please call your  pharmacy.     Studies Ordered:   Orders Placed This Encounter  Procedures  . EKG 12-Lead      Glenetta Hew, M.D., M.S. Interventional Cardiologist   Pager # [phone] Phone # [phone] [address] [place], [place] [address]

## 2016-01-03 ENCOUNTER — Encounter: Payer: Self-pay | Admitting: Cardiology

## 2016-01-03 NOTE — Assessment & Plan Note (Signed)
Well-controlled on current regimen. Would not adjust further at this point.

## 2016-01-03 NOTE — Assessment & Plan Note (Signed)
He had been doing fairly well with weight loss, is now put back on some weight, partly because he is limited as far as mobility goes with arthritis pains and dyspnea. He is hoping to try to get into pulmonary rehabilitation. We also talked about the importance of dietary modification.

## 2016-01-03 NOTE — Assessment & Plan Note (Signed)
Relatively well-controlled on current dose of statin as of June last year. I don't have recent labs. They're checked at his PCPs office. Continues to be on atorvastatin 40 mg daily.

## 2016-01-03 NOTE — Assessment & Plan Note (Signed)
EF improved to 55-60% as of the April 2017 echo. This is better than I would've expected based on the extent of RCA disease at the time of his MI. He actually isn't really acting like someone cardiomyopathy. I would suspect most of his dyspnea is related to COPD. Remains on stable regimen with Zebeta and losartan as well as standing dose of Lasix.

## 2016-01-03 NOTE — Assessment & Plan Note (Signed)
Probably more now related to diastolic dysfunction then combined heart failure as his EF is back to normal. On stable dose of ARB and beta blocker. Taking standing dose of Lasix with occasional doses doubled up in the morning. Relatively stable

## 2016-01-03 NOTE — Assessment & Plan Note (Signed)
Chronic is a combination of COPD, deconditioning and some diastolic dysfunction.  Continue beta blocker, ARB for afterload reduction as well as Lasix to treat diastolic dysfunction portion.

## 2016-01-03 NOTE — Assessment & Plan Note (Addendum)
No further recurrence. Remains on Eliquis for anticoagulation and beta blocker for rate control.  This patients CHA2DS2-VASc Score and unadjusted Ischemic Stroke Rate (% per year) is equal to 3.2 % stroke rate/year from a score of 3.

## 2016-01-03 NOTE — Assessment & Plan Note (Addendum)
No active symptoms with 2 very large caliber stents in the RCA. Based on their size, unlikely to have in-stent stenosis or thrombosis that will be significant. On Plavix, statin plus coenzyme Q 10 along with bisoprolol (beta 1 selective) and ARB.  Would continue Plavix for secondary prevention, but would be okay to stop for procedures.  It sounds like he is being evaluated for her anemia. I recommend that he hold his Plavix until his PCP is evaluate his anemia and we are sure that there is no sign of bleeding.

## 2016-01-07 DIAGNOSIS — Z0000 Encounter for general adult medical examination without abnormal findings: Secondary | ICD-10-CM | POA: Diagnosis not present

## 2016-01-25 DIAGNOSIS — J449 Chronic obstructive pulmonary disease, unspecified: Secondary | ICD-10-CM | POA: Diagnosis not present

## 2016-02-01 ENCOUNTER — Other Ambulatory Visit: Payer: Self-pay | Admitting: Cardiology

## 2016-02-01 ENCOUNTER — Other Ambulatory Visit: Payer: Self-pay | Admitting: Cardiovascular Disease

## 2016-02-01 NOTE — Telephone Encounter (Signed)
REFILL 

## 2016-02-23 DIAGNOSIS — I5032 Chronic diastolic (congestive) heart failure: Secondary | ICD-10-CM | POA: Diagnosis not present

## 2016-02-23 DIAGNOSIS — D509 Iron deficiency anemia, unspecified: Secondary | ICD-10-CM | POA: Diagnosis not present

## 2016-02-23 DIAGNOSIS — I10 Essential (primary) hypertension: Secondary | ICD-10-CM | POA: Diagnosis not present

## 2016-02-23 DIAGNOSIS — J9610 Chronic respiratory failure, unspecified whether with hypoxia or hypercapnia: Secondary | ICD-10-CM | POA: Diagnosis not present

## 2016-02-23 DIAGNOSIS — Z1389 Encounter for screening for other disorder: Secondary | ICD-10-CM | POA: Diagnosis not present

## 2016-02-23 DIAGNOSIS — J449 Chronic obstructive pulmonary disease, unspecified: Secondary | ICD-10-CM | POA: Diagnosis not present

## 2016-02-23 DIAGNOSIS — D649 Anemia, unspecified: Secondary | ICD-10-CM | POA: Diagnosis not present

## 2016-02-23 DIAGNOSIS — I4891 Unspecified atrial fibrillation: Secondary | ICD-10-CM | POA: Diagnosis not present

## 2016-02-25 DIAGNOSIS — J449 Chronic obstructive pulmonary disease, unspecified: Secondary | ICD-10-CM | POA: Diagnosis not present

## 2016-03-13 DIAGNOSIS — D649 Anemia, unspecified: Secondary | ICD-10-CM | POA: Diagnosis not present

## 2016-03-27 DIAGNOSIS — J449 Chronic obstructive pulmonary disease, unspecified: Secondary | ICD-10-CM | POA: Diagnosis not present

## 2016-03-29 DIAGNOSIS — J449 Chronic obstructive pulmonary disease, unspecified: Secondary | ICD-10-CM | POA: Diagnosis not present

## 2016-03-29 DIAGNOSIS — I2510 Atherosclerotic heart disease of native coronary artery without angina pectoris: Secondary | ICD-10-CM | POA: Diagnosis not present

## 2016-03-29 DIAGNOSIS — I10 Essential (primary) hypertension: Secondary | ICD-10-CM | POA: Diagnosis not present

## 2016-03-29 DIAGNOSIS — J9611 Chronic respiratory failure with hypoxia: Secondary | ICD-10-CM | POA: Diagnosis not present

## 2016-04-09 ENCOUNTER — Encounter (INDEPENDENT_AMBULATORY_CARE_PROVIDER_SITE_OTHER): Payer: Self-pay

## 2016-04-09 ENCOUNTER — Ambulatory Visit (INDEPENDENT_AMBULATORY_CARE_PROVIDER_SITE_OTHER): Payer: Self-pay | Admitting: Vascular Surgery

## 2016-04-24 DIAGNOSIS — J449 Chronic obstructive pulmonary disease, unspecified: Secondary | ICD-10-CM | POA: Diagnosis not present

## 2016-04-30 DIAGNOSIS — Z1389 Encounter for screening for other disorder: Secondary | ICD-10-CM | POA: Diagnosis not present

## 2016-04-30 DIAGNOSIS — I4891 Unspecified atrial fibrillation: Secondary | ICD-10-CM | POA: Diagnosis not present

## 2016-04-30 DIAGNOSIS — J9610 Chronic respiratory failure, unspecified whether with hypoxia or hypercapnia: Secondary | ICD-10-CM | POA: Diagnosis not present

## 2016-04-30 DIAGNOSIS — Z0001 Encounter for general adult medical examination with abnormal findings: Secondary | ICD-10-CM | POA: Diagnosis not present

## 2016-05-25 DIAGNOSIS — J449 Chronic obstructive pulmonary disease, unspecified: Secondary | ICD-10-CM | POA: Diagnosis not present

## 2016-06-12 DIAGNOSIS — J449 Chronic obstructive pulmonary disease, unspecified: Secondary | ICD-10-CM | POA: Diagnosis not present

## 2016-06-24 ENCOUNTER — Other Ambulatory Visit: Payer: Self-pay | Admitting: Cardiology

## 2016-06-24 DIAGNOSIS — J449 Chronic obstructive pulmonary disease, unspecified: Secondary | ICD-10-CM | POA: Diagnosis not present

## 2016-06-27 ENCOUNTER — Telehealth: Payer: Self-pay | Admitting: Cardiology

## 2016-06-27 DIAGNOSIS — I2510 Atherosclerotic heart disease of native coronary artery without angina pectoris: Secondary | ICD-10-CM

## 2016-06-27 DIAGNOSIS — E782 Mixed hyperlipidemia: Secondary | ICD-10-CM | POA: Diagnosis not present

## 2016-06-27 DIAGNOSIS — Z9861 Coronary angioplasty status: Secondary | ICD-10-CM

## 2016-06-27 DIAGNOSIS — I10 Essential (primary) hypertension: Secondary | ICD-10-CM

## 2016-06-27 NOTE — Telephone Encounter (Signed)
Returned call to patient.He will have fasting lipid panel and cmet done at [facility] lab in [place] 06/28/16.

## 2016-06-27 NOTE — Telephone Encounter (Signed)
Patient calling to verify if he needs to have blood work completed before his appt on Monday 07-02-16. Thanks.

## 2016-06-27 NOTE — Addendum Note (Signed)
Addended by: Kathyrn Lass on: 06/27/2016 12:15 PM   Modules accepted: Orders

## 2016-06-27 NOTE — Telephone Encounter (Signed)
Returned call to patient no answer.LMTC. 

## 2016-06-29 LAB — COMPREHENSIVE METABOLIC PANEL
ALT: 34 U/L (ref 9–46)
AST: 26 U/L (ref 10–35)
Albumin: 4.4 g/dL (ref 3.6–5.1)
Alkaline Phosphatase: 71 U/L (ref 40–115)
BUN: 22 mg/dL (ref 7–25)
CALCIUM: 9.2 mg/dL (ref 8.6–10.3)
CHLORIDE: 96 mmol/L — AB (ref 98–110)
CO2: 32 mmol/L — AB (ref 20–31)
Creat: 0.99 mg/dL (ref 0.70–1.25)
GLUCOSE: 103 mg/dL — AB (ref 65–99)
POTASSIUM: 4 mmol/L (ref 3.5–5.3)
Sodium: 138 mmol/L (ref 135–146)
Total Bilirubin: 0.4 mg/dL (ref 0.2–1.2)
Total Protein: 7 g/dL (ref 6.1–8.1)

## 2016-06-29 LAB — LIPID PANEL
CHOL/HDL RATIO: 2.2 ratio (ref <5.0)
Cholesterol: 154 mg/dL (ref <200)
HDL: 71 mg/dL (ref >40)
LDL CALC: 61 mg/dL (ref <100)
Triglycerides: 109 mg/dL (ref <150)
VLDL: 22 mg/dL (ref <30)

## 2016-07-02 ENCOUNTER — Encounter: Payer: Self-pay | Admitting: Cardiology

## 2016-07-02 ENCOUNTER — Ambulatory Visit (INDEPENDENT_AMBULATORY_CARE_PROVIDER_SITE_OTHER): Payer: Medicare Other | Admitting: Cardiology

## 2016-07-02 VITALS — BP 102/70 | HR 62 | Ht 71.0 in | Wt 225.4 lb

## 2016-07-02 DIAGNOSIS — E66811 Obesity, class 1: Secondary | ICD-10-CM

## 2016-07-02 DIAGNOSIS — E669 Obesity, unspecified: Secondary | ICD-10-CM | POA: Diagnosis not present

## 2016-07-02 DIAGNOSIS — I5032 Chronic diastolic (congestive) heart failure: Secondary | ICD-10-CM | POA: Diagnosis not present

## 2016-07-02 DIAGNOSIS — I480 Paroxysmal atrial fibrillation: Secondary | ICD-10-CM

## 2016-07-02 DIAGNOSIS — I2119 ST elevation (STEMI) myocardial infarction involving other coronary artery of inferior wall: Secondary | ICD-10-CM

## 2016-07-02 DIAGNOSIS — I255 Ischemic cardiomyopathy: Secondary | ICD-10-CM

## 2016-07-02 DIAGNOSIS — I2510 Atherosclerotic heart disease of native coronary artery without angina pectoris: Secondary | ICD-10-CM | POA: Diagnosis not present

## 2016-07-02 DIAGNOSIS — I10 Essential (primary) hypertension: Secondary | ICD-10-CM

## 2016-07-02 DIAGNOSIS — E785 Hyperlipidemia, unspecified: Secondary | ICD-10-CM | POA: Diagnosis not present

## 2016-07-02 DIAGNOSIS — Z9861 Coronary angioplasty status: Secondary | ICD-10-CM | POA: Diagnosis not present

## 2016-07-02 NOTE — Progress Notes (Signed)
PCP: Redmond School, MD  Clinic Note: Chief Complaint  Patient presents with  . Follow-up    CAD-PCI for inferior STEMI, mild cardiac myopathy    HPI: Benjamin [name] is a 61 y.o. male with a PMH below who presents today for six-month follow-up of CAD-PCI,-ischemic cardiomyopathy with A. Fib.he also has chronic combined systolic and diastolic heart failure with long-standing COPD on 4 L of home oxygen. Uses ELIQUIS For anticoagulation  Inferior STEMI October 2014 very large RCA occluded, cardiac shock and atrial fibrillation requiring cardioversion. ? PCL very large BMS stent 5 x 24 mm postdilated to 5.5 mm ? Moderate Ischemic Cardiomyopathy with EF of 40-45% --> improved to 55-60%  [name] was last seen on 01/02/2016. Was doing relatively well at that time. Was using intermittent additional dose of Lasix between 1-2 times a week.  Recent Hospitalizations: none  Studies Personally Reviewed - (if available, images/films reviewed: From Epic Chart or Care Everywhere)  none  Interval History: [name] is doing well today - he has continued to loose weight -- eating healthier & trying to get out walking.  He is hoping to get an O2 concentrator for portable O2 instead of the small canister in order to allow him more mobility.  His dyspnea is stable from COPD, but no exertional chest pain or pressure c/w angina.  It has been over a year since his last COPD related hospitalization - but did just complete a steroid taper for a mnior exacerbation.  He denies any PND, orthopnea or edema. No palpitations, lightheadedness, dizziness, weakness or syncope/near syncope. No TIA/amaurosis fugax symptoms. No melena, hematochezia, hematuria, or epstaxis. No claudication.  ROS: A comprehensive was performed. Review of Systems  Constitutional: Positive for weight loss (He had gained up to 235 lb - has lost 10, still not back to where he was).  HENT: Negative for congestion and nosebleeds.     Respiratory: Positive for cough (chronic - but better since he quit smoking), shortness of breath (at baseline) and wheezing (much less).        Just getting over a COPD flare -- finished prednisone.  Gastrointestinal: Negative for blood in stool and melena.  Genitourinary: Negative for hematuria.  Neurological: Negative for dizziness, seizures and loss of consciousness.  Psychiatric/Behavioral: Negative for depression and memory loss. The patient is not nervous/anxious and does not have insomnia.   All other systems reviewed and are negative.   I have reviewed and (if needed) personally updated the patient's problem list, medications, allergies, past medical and surgical history, social and family history.   Past Medical History:  Diagnosis Date  . Atrial fibrillation with RVR - peri-MI; recurrent after DCCV 11/19/2012  . CAD S/P percutaneous coronary angioplasty  11/18/2012   - PCI mid RCA - 5.0 mm x 24 mm VeriFlex BMS (5.6 mm)  . Chronic combined systolic and diastolic heart failure, NYHA class 2 ([place]) 11/17/2012  . Colon polyp    Status post polypectomy x3.  [name] COPD (chronic obstructive pulmonary disease) ([place])   . Dyslipidemia- low HDL 11/19/2012  . Ischemic cardiomyopathy 11/19/2012   EF 40-45% with inferoseptal HK, Gr 2DD - high LVEDP/LAP. --> confirmed by f/u Echo 03/2013.  [name] STEMI of inferior wall 11/18/12- RCA BMS 11/17/2012   Cardiogenic shock - post MI with RV infarction [785.51]  . Tobacco abuse    40 Pk-yr (1- 1 1/2 PPD) --> Quit 11/18/2012 when presented with STEMI    Past Surgical History:  Procedure Laterality  Date  . COLONOSCOPY N/A 11/20/2013   Procedure: COLONOSCOPY;  Surgeon: Jamesetta So, MD;  Location: [facility];  Service: Gastroenterology;  Laterality: N/A;  . CORONARY ANGIOPLASTY WITH STENT PLACEMENT Right 11/17/12   Mid RCA aspiration thrombectomy and PCI with VeriFlex BMS 5.0 mm x 24 mm (5.6 mm; also PTCA of distal RPL 4 distal embolization.  .  Dental extractions    . LEFT HEART CATHETERIZATION WITH CORONARY ANGIOGRAM N/A 11/17/2012   Procedure: LEFT HEART CATHETERIZATION WITH CORONARY ANGIOGRAM;  Surgeon: Leonie Man, MD;  Location: [facility] CATH LAB;  Service: Cardiovascular;  Laterality: N/A;  . TRANSTHORACIC ECHOCARDIOGRAM  11/18/2012   Normal LV size, moderate reduced function 40-45% with severe HK-AK of the inferoseptal wall with incoordinate septal motion.  Domingo Dimes ECHOCARDIOGRAM  March 15   Moderately reduced LVEF of 40 at 45% with inferoseptal hypokinesis and high filling pressures.  . TRANSTHORACIC ECHOCARDIOGRAM  04/2015   EF 55-60%.; Pericardial fat pad versus effusion; no tamponade    Current Meds  Medication Sig  . acetaminophen (TYLENOL) 500 MG tablet Take by mouth every 6 (six) hours as needed for pain.  [name] albuterol (PROVENTIL HFA;VENTOLIN HFA) 108 (90 BASE) MCG/ACT inhaler Inhale 2 puffs into the lungs every 6 (six) hours as needed for wheezing.  [name] atorvastatin (LIPITOR) 40 MG tablet TAKE 1 TABLET BY MOUTH EVERY DAY  . bisoprolol (ZEBETA) 5 MG tablet TAKE 1/2 OF A TABLET BY MOUTH EVERY DAY  . clopidogrel (PLAVIX) 75 MG tablet TAKE 1 TABLET BY MOUTH DAILY  . Coenzyme Q10 (CO Q-10) 100 MG CAPS Take 300 mg by mouth daily.  [name] Dextromethorphan-Guaifenesin (MUCINEX DM MAXIMUM STRENGTH PO) Take 1 tablet by mouth every 4 (four) hours as needed (congestion).   [name] ELIQUIS 5 MG TABS tablet TAKE 1 TABLET BY MOUTH TWICE DAILY  . feeding supplement, ENSURE ENLIVE, (ENSURE ENLIVE) LIQD Take 237 mLs by mouth 2 (two) times daily between meals.  . ferrous sulfate 325 (65 FE) MG tablet Take 325 mg by mouth 4 (four) times daily.  . furosemide (LASIX) 40 MG tablet TAKE ONE TO TWO TABLETS BY MOUTH EVERY MORNING AS NEEDED AND TAKE 1 TABLET BY MOUTH EVERY EVENING  . gabapentin (NEURONTIN) 300 MG capsule Take 600 mg by mouth 4 (four) times daily.  [name] levalbuterol (XOPENEX) 0.63 MG/3ML nebulizer solution Take 3 mLs (0.63 mg total) by  nebulization every 3 (three) hours as needed for wheezing or shortness of breath.  . losartan (COZAAR) 50 MG tablet TAKE 1 TABLET BY MOUTH EVERY DAY  . nitroGLYCERIN (NITROSTAT) 0.4 MG SL tablet Place 1 tablet (0.4 mg total) under the tongue every 5 (five) minutes x 3 doses as needed for chest pain.  Donell Sievert IN Inhale into the lungs. Using 4 liters nasal cannula ,continuous  . potassium chloride (K-DUR,KLOR-CON) 10 MEQ tablet TAKE 1 TABLET BY MOUTH EVERY DAY  . sodium chloride (OCEAN) 0.65 % SOLN nasal spray Place 1 spray into both nostrils as needed for congestion.  [name] SPIRIVA HANDIHALER 18 MCG inhalation capsule Place 18 mcg into inhaler and inhale daily.   [name] sulfamethoxazole-trimethoprim (BACTRIM DS,SEPTRA DS) 800-160 MG tablet Take 1 tablet by mouth 2 (two) times daily as needed (for sickness).   . SYMBICORT 160-4.5 MCG/ACT inhaler Inhale 2 puffs into the lungs 2 (two) times daily.     Allergies  Allergen Reactions  . Codeine     Red blotches  . Penicillins     Childhood reaction  Social History   Social History  . Marital status: Divorced    Spouse name: N/A  . Number of children: N/A  . Years of education: N/A   Occupational History  . Disabled secondary to COPD    Social History Main Topics  . Smoking status: Former Smoker    Packs/day: 1.00    Years: 40.00  . Smokeless tobacco: Never Used     Comment: [place] 10/2102  . Alcohol use No  . Drug use: No  . Sexual activity: Not Asked   Other Topics Concern  . None   Social History Narrative   Divorced father of 2 (one daughter one son).   Lives alone, but his daughter who lives nearby is "watching him like a Engineer, drilling ".   He quit smoking during his hospitalization after a 40-pack-year smoking history.   He is now gradually building up his exercise level walking 6 minutes at a time several times a day. He really a waiting Cardiac Rehabilitation.   Does not drink alcohol.    family  history includes Cancer in his sister; Heart attack in his father and mother.  Wt Readings from Last 3 Encounters:  07/02/16 225 lb 6.4 oz (102.2 kg)  01/02/16 232 lb (105.2 kg)  07/05/15 209 lb 9.6 oz (95.1 kg)    PHYSICAL EXAM BP 102/70   Pulse 62   Ht 5\' 11"  (1.803 m)   Wt 225 lb 6.4 oz (102.2 kg)   BMI 31.44 kg/m  General appearance: chronically ill (non-toxic) appearing gentleman in NAD. On home O2 with baseline accessory muscle use.  Well groomed.   HEENT: [place]/AT, EOMI, MMM, anicteric sclera - puffy face from recent prednisone taper. Neck: no adenopathy, no carotid bruit and no JVD Lungs: distant/ diminished BS bilaterally with increased AP diameter.  Mild interstitial sounds, but no W/R/R/.  Actually non-labored.  Heart: RRR with normal S1 and S2 (distant). No M/R/G. Nondisplaced PMI. Abdomen: soft, non-tender; bowel sounds normal; no masses,  no organomegaly; no HJR Extremities: extremities normal, atraumatic, no cyanosis, and edema - trivial pedal Pulses: 2+ and symmetric; diminished pedal pulses bilaterally. Skin: Thin skin from chronic prednisone, but no significant rashes or lesions.  Neurologic: Mental status: Alert & oriented x 3, thought content appropriate; non-focal exam.  Pleasant mood & affect.    Adult ECG Report  Rate: 62 ;  Rhythm: normal sinus rhythm and iRBBB. CRO Inferior MI, age udetermined. Normal axis, intervals & duration.;   Narrative Interpretation: stable EKG.   Other studies Reviewed: Additional studies/ records that were reviewed today include:  Recent Labs:   Lab Results  Component Value Date   CHOL 154 06/27/2016   HDL 71 06/27/2016   LDLCALC 61 06/27/2016   TRIG 109 06/27/2016   CHOLHDL 2.2 06/27/2016   Lab Results  Component Value Date   CREATININE 0.99 06/27/2016   BUN 22 06/27/2016   NA 138 06/27/2016   K 4.0 06/27/2016   CL 96 (L) 06/27/2016   CO2 32 (H) 06/27/2016    ASSESSMENT / PLAN: Problem List Items Addressed This  Visit    CAD S/P percutaneous coronary angioplasty - PCI mid RCA - 5.0 mm x 24 mm VeriFlex BMS (5.6 mm) (Chronic)    No active anginal symptoms. He is to large caliber bare metal stents in the RCA. He had significant thrombus burden, and after discussion we decided to continue Plavix per second are prevention, but without aspirin. He is on stable  dose of beta blocker and ARB along with statin.      Relevant Orders   EKG 12-Lead (Completed)   Cardiomyopathy, ischemic - EF 40-45%; now up to 55-60% (Chronic)    Now resolved by recent echo. Continues to be on stable regimen with beta blocker and ARB. He also is on standing dose of Lasix for diastolic dysfunction. He has not had use any additional dosing in the last several weeks.      Chronic diastolic HF (heart failure), NYHA class 2 (H[facility]) (Chronic)    Stable now on good regimen. Take standing dose of Lasix with intermittent urine dosing. None recently.      Relevant Orders   EKG 12-Lead (Completed)   Dyslipidemia, goal LDL below 70 (Chronic)    Excellent control by recent labs. Remains on atorvastatin with no myalgias.      Essential hypertension (Chronic)    If anything a little bit hypotensive. I basically told him that if he is starting to feel a little bit lightheaded or dizzy, he should hold his ARB dose. He takes that in the evening and his bisoprolol in the morning.      Obesity (BMI 30.0-34.9) (Chronic)    He took a setback with his weight loss, but is now back on track. He still intends to try to lose another 20 pounds. Hopefully if he can get the oxygen canister set up correctly so that he can have some more freedom to walk around, he will be more free to do exercise..      Paroxysmal Atrial fibrillation with RVR - peri-MI; recurrent after DCCV (Chronic)    No recurrence that I can tell. He is still on ELIQUIS based on CHA2DS2Vasc score of 3. Low threshold for stopping Plavix.      Relevant Orders   EKG 12-Lead (Completed)    STEMI of inferior wall 11/18/12- RCA BMS (Chronic)    He had a pretty large inferior MI and initially had reduced ejection fraction on LV gram and was initially, located by post infarct cardiac shock with RV infarct.[employer], follow-up echo showed notable improvement with the most recent echo in April 2017 showing EF 55-60%. He had a very large bare-metal stent placed in the RCA. Remains on Plavix.         Current medicines are reviewed at length with the patient today. (+/- concerns) n/a The following changes have been made: n/a  Patient Instructions  MEDICATION CHANGE  IF YO U HAVE SOME DIZZINESS IN THE MORNING , MAY HOLD LOSARTAN FOR THAT DAY. OTHERWISE NO CHANGES       Your physician wants you to follow-up in [place]. You will receive a reminder letter in the mail two months in advance. If you don't receive a letter, please call our office to schedule the follow-up appointment.     If you need a refill on your cardiac medications before your next appointment, please call your pharmacy.     Studies Ordered:   Orders Placed This Encounter  Procedures  . EKG 12-Lead      Glenetta Hew, M.D., M.S. Interventional Cardiologist   Pager # [phone] Phone # [phone] [address]. [place], [place] [address]

## 2016-07-02 NOTE — Patient Instructions (Signed)
MEDICATION CHANGE  IF YO U HAVE SOME DIZZINESS IN THE MORNING , MAY HOLD LOSARTAN FOR THAT DAY. OTHERWISE NO CHANGES       Your physician wants you to follow-up in [place]. You will receive a reminder letter in the mail two months in advance. If you don't receive a letter, please call our office to schedule the follow-up appointment.     If you need a refill on your cardiac medications before your next appointment, please call your pharmacy.

## 2016-07-03 ENCOUNTER — Encounter: Payer: Self-pay | Admitting: Cardiology

## 2016-07-03 NOTE — Assessment & Plan Note (Signed)
He took a setback with his weight loss, but is now back on track. He still intends to try to lose another 20 pounds. Hopefully if he can get the oxygen canister set up correctly so that he can have some more freedom to walk around, he will be more free to do exercise.[name]

## 2016-07-03 NOTE — Assessment & Plan Note (Signed)
Now resolved by recent echo. Continues to be on stable regimen with beta blocker and ARB. He also is on standing dose of Lasix for diastolic dysfunction. He has not had use any additional dosing in the last several weeks.

## 2016-07-03 NOTE — Assessment & Plan Note (Signed)
If anything a little bit hypotensive. I basically told him that if he is starting to feel a little bit lightheaded or dizzy, he should hold his ARB dose. He takes that in the evening and his bisoprolol in the morning.

## 2016-07-03 NOTE — Assessment & Plan Note (Signed)
No recurrence that I can tell. He is still on ELIQUIS based on CHA2DS2Vasc score of 3. Low threshold for stopping Plavix.

## 2016-07-03 NOTE — Assessment & Plan Note (Signed)
He had a pretty large inferior MI and initially had reduced ejection fraction on LV gram and was initially, located by post infarct cardiac shock with RV infarct.[employer], follow-up echo showed notable improvement with the most recent echo in April 2017 showing EF 55-60%. He had a very large bare-metal stent placed in the RCA. Remains on Plavix.

## 2016-07-03 NOTE — Assessment & Plan Note (Signed)
Stable now on good regimen. Take standing dose of Lasix with intermittent urine dosing. None recently.

## 2016-07-03 NOTE — Assessment & Plan Note (Signed)
Excellent control by recent labs. Remains on atorvastatin with no myalgias.

## 2016-07-03 NOTE — Assessment & Plan Note (Signed)
No active anginal symptoms. He is to large caliber bare metal stents in the RCA. He had significant thrombus burden, and after discussion we decided to continue Plavix per second are prevention, but without aspirin. He is on stable dose of beta blocker and ARB along with statin.

## 2016-07-19 ENCOUNTER — Other Ambulatory Visit: Payer: Self-pay | Admitting: Cardiology

## 2016-07-25 DIAGNOSIS — J449 Chronic obstructive pulmonary disease, unspecified: Secondary | ICD-10-CM | POA: Diagnosis not present

## 2016-07-30 DIAGNOSIS — I10 Essential (primary) hypertension: Secondary | ICD-10-CM | POA: Diagnosis not present

## 2016-07-30 DIAGNOSIS — I4891 Unspecified atrial fibrillation: Secondary | ICD-10-CM | POA: Diagnosis not present

## 2016-07-30 DIAGNOSIS — J449 Chronic obstructive pulmonary disease, unspecified: Secondary | ICD-10-CM | POA: Diagnosis not present

## 2016-07-30 DIAGNOSIS — J9611 Chronic respiratory failure with hypoxia: Secondary | ICD-10-CM | POA: Diagnosis not present

## 2016-08-14 DIAGNOSIS — J9611 Chronic respiratory failure with hypoxia: Secondary | ICD-10-CM | POA: Diagnosis not present

## 2016-08-14 DIAGNOSIS — J449 Chronic obstructive pulmonary disease, unspecified: Secondary | ICD-10-CM | POA: Diagnosis not present

## 2016-09-14 DIAGNOSIS — J449 Chronic obstructive pulmonary disease, unspecified: Secondary | ICD-10-CM | POA: Diagnosis not present

## 2016-09-14 DIAGNOSIS — J9611 Chronic respiratory failure with hypoxia: Secondary | ICD-10-CM | POA: Diagnosis not present

## 2016-10-15 DIAGNOSIS — J9611 Chronic respiratory failure with hypoxia: Secondary | ICD-10-CM | POA: Diagnosis not present

## 2016-10-15 DIAGNOSIS — J449 Chronic obstructive pulmonary disease, unspecified: Secondary | ICD-10-CM | POA: Diagnosis not present

## 2016-10-17 ENCOUNTER — Other Ambulatory Visit: Payer: Self-pay | Admitting: Cardiovascular Disease

## 2016-11-01 NOTE — Telephone Encounter (Signed)
REFILL 

## 2016-11-01 NOTE — Telephone Encounter (Signed)
°*  STAT* If patient is at the pharmacy, call can be transferred to refill team.   1. Which medications need to be refilled? (please list name of each medication and dose if known) Plavix  2. Which pharmacy/location (including street and city if local pharmacy) is medication to be sent to? [facility]  3. Do they need a 30 day or 90 day supply? [facility]

## 2016-11-07 DIAGNOSIS — J9611 Chronic respiratory failure with hypoxia: Secondary | ICD-10-CM | POA: Diagnosis not present

## 2016-11-07 DIAGNOSIS — J449 Chronic obstructive pulmonary disease, unspecified: Secondary | ICD-10-CM | POA: Diagnosis not present

## 2016-11-07 DIAGNOSIS — I2510 Atherosclerotic heart disease of native coronary artery without angina pectoris: Secondary | ICD-10-CM | POA: Diagnosis not present

## 2016-11-07 DIAGNOSIS — I4891 Unspecified atrial fibrillation: Secondary | ICD-10-CM | POA: Diagnosis not present

## 2016-11-14 DIAGNOSIS — J449 Chronic obstructive pulmonary disease, unspecified: Secondary | ICD-10-CM | POA: Diagnosis not present

## 2016-11-14 DIAGNOSIS — J9611 Chronic respiratory failure with hypoxia: Secondary | ICD-10-CM | POA: Diagnosis not present

## 2016-11-22 ENCOUNTER — Telehealth: Payer: Self-pay | Admitting: *Deleted

## 2016-11-22 NOTE — Telephone Encounter (Signed)
LEFT MESSAGE TO CALL BACK - QUESTION IN REGARDS TO MEDICATION CLOPIDOGREL

## 2016-11-27 NOTE — Telephone Encounter (Signed)
PATIENT IS TAKING MEDICATION  MEDICATION HAD BEEN REFILLED.

## 2016-12-03 NOTE — Telephone Encounter (Signed)
Spoke to patient on 11/22/16. patient states he is taking clopidogrel - and has received rx refilled. See other notation medication refill

## 2016-12-15 DIAGNOSIS — J9611 Chronic respiratory failure with hypoxia: Secondary | ICD-10-CM | POA: Diagnosis not present

## 2016-12-15 DIAGNOSIS — J449 Chronic obstructive pulmonary disease, unspecified: Secondary | ICD-10-CM | POA: Diagnosis not present

## 2016-12-16 ENCOUNTER — Other Ambulatory Visit: Payer: Self-pay | Admitting: Cardiology

## 2016-12-31 ENCOUNTER — Encounter: Payer: Self-pay | Admitting: Cardiology

## 2016-12-31 ENCOUNTER — Ambulatory Visit: Payer: Medicare Other | Admitting: Cardiology

## 2016-12-31 VITALS — BP 120/76 | HR 68 | Ht 71.0 in | Wt 225.4 lb

## 2016-12-31 DIAGNOSIS — E785 Hyperlipidemia, unspecified: Secondary | ICD-10-CM

## 2016-12-31 DIAGNOSIS — I2119 ST elevation (STEMI) myocardial infarction involving other coronary artery of inferior wall: Secondary | ICD-10-CM | POA: Diagnosis not present

## 2016-12-31 DIAGNOSIS — I5032 Chronic diastolic (congestive) heart failure: Secondary | ICD-10-CM | POA: Diagnosis not present

## 2016-12-31 DIAGNOSIS — I2510 Atherosclerotic heart disease of native coronary artery without angina pectoris: Secondary | ICD-10-CM | POA: Diagnosis not present

## 2016-12-31 DIAGNOSIS — Z9861 Coronary angioplasty status: Secondary | ICD-10-CM | POA: Diagnosis not present

## 2016-12-31 DIAGNOSIS — I480 Paroxysmal atrial fibrillation: Secondary | ICD-10-CM | POA: Diagnosis not present

## 2016-12-31 DIAGNOSIS — I10 Essential (primary) hypertension: Secondary | ICD-10-CM | POA: Diagnosis not present

## 2016-12-31 NOTE — Progress Notes (Signed)
PCP: Redmond School, MD  Clinic Note: Chief Complaint  Patient presents with  . Follow-up    no chest pain  . Coronary Artery Disease  . Atrial Fibrillation    No noted recurrence since post    HPI: Benjamin [name] is a 61 y.o. male with a PMH below who presents today for six-month follow-up of CAD-PCI,-ischemic cardiomyopathy with A. Fib.he also has chronic combined systolic and diastolic heart failure with long-standing COPD on 4 L of home oxygen. Uses ELIQUIS For anticoagulation  Inferior STEMI October 2014 very large RCA occluded, cardiac shock and atrial fibrillation requiring cardioversion. ? PCL very large BMS stent 5 x 24 mm postdilated to 5.5 mm ? Moderate Ischemic Cardiomyopathy with EF of 40-45% --> improved to 55-60%  Benjamin [name] was last seen on July 02, 2016.  Doing well.  Losing weight.  Eating healthy..  was doing relatively well at that time. Was using intermittent additional dose of Lasix between 1-2 times a week.  Recent Hospitalizations: none  Studies Personally Reviewed - (if available, images/films reviewed: From Epic Chart or Care Everywhere)  none  Interval History: Benjamin [name] returns today doing quite well.  He is in great spirits.  He is very happy because he now has his oxygen concentrator.  He is noting that this allows some much greater freedom to move around.  He seems to have plateaued with his weight loss (acknowledging that he probably overate over Thanksgiving).  But he is still trying to do walking unfortunately when the weather is bad he has a hard time going outside to walk.  When it is cold or very hot, he notes worsening dyspnea.  He also notes that he probably is "gotten a bit lazy ".  He acknowledges that he needs to get back into this effort.  He does walk ss much she can however especially when going to the store.  He really denies any resting or exertional anginal symptoms.  Despite having his normal exertional dyspnea, he really does  not notice progression of dyspnea symptoms with rest or exertion.  He has not had a COPD exacerbation in over a year.  He now has as needed supplies of prednisone and antibiotics start if symptoms do worsen.  This seems to be keeping him out of the hospital. He has not had any heart failure symptoms of PND, orthopnea or edema.  When he does take prednisone he notes the swelling does get a little bit worse. He has not had any recurrent symptoms to suggest any recurrence of A. fib.  No rapid irregular heartbeats or palpitations.  No irregular heartbeats.  No syncope/near syncope or TIA/amaurosis fugax. No significant bleeding issues such as melena, hematochezia, hematuria, or epistaxis.  He does note easy bruising. No claudication symptoms, but he probably does not walk enough to really get that sense.  ROS: A comprehensive was performed. Review of Systems  Constitutional: Negative for weight loss (Seems to have plateaued because of losing focus with his diet and exercise.).  HENT: Negative for congestion and nosebleeds.   Respiratory: Positive for cough (chronic - but better since he quit smoking), shortness of breath (at baseline) and wheezing (much Benjamin).         baseline COPD symptoms at present  Cardiovascular: Negative for leg swelling.  Gastrointestinal: Negative for blood in stool and melena.  Genitourinary: Negative for hematuria.  Musculoskeletal: Positive for back pain and joint pain. Negative for falls.  Neurological: Negative for dizziness, seizures and  loss of consciousness.  Psychiatric/Behavioral: Negative for depression and memory loss. The patient is not nervous/anxious and does not have insomnia.   All other systems reviewed and are negative.   I have reviewed and (if needed) personally updated the patient's problem list, medications, allergies, past medical and surgical history, social and family history.   Past Medical History:  Diagnosis Date  . Atrial fibrillation with  RVR - peri-MI; recurrent after DCCV 11/19/2012  . CAD S/P percutaneous coronary angioplasty  11/18/2012   - PCI mid RCA - 5.0 mm x 24 mm VeriFlex BMS (5.6 mm)  . Chronic combined systolic and diastolic heart failure, NYHA class 2 ([place]) 11/17/2012  . Colon polyp    Status post polypectomy x3.  [name] COPD (chronic obstructive pulmonary disease) ([place])   . Dyslipidemia- low HDL 11/19/2012  . Ischemic cardiomyopathy 11/19/2012   EF 40-45% with inferoseptal HK, Gr 2DD - high LVEDP/LAP. --> confirmed by f/u Echo 03/2013.  [name] STEMI of inferior wall 11/18/12- RCA BMS 11/17/2012   Cardiogenic shock - post MI with RV infarction [785.51]  . Tobacco abuse    40 Pk-yr (1- 1 1/2 PPD) --> Quit 11/18/2012 when presented with STEMI    Past Surgical History:  Procedure Laterality Date  . COLONOSCOPY N/A 11/20/2013   Procedure: COLONOSCOPY;  Surgeon: Jamesetta So, MD;  Location: [facility];  Service: Gastroenterology;  Laterality: N/A;  . CORONARY ANGIOPLASTY WITH STENT PLACEMENT Right 11/17/12   Mid RCA aspiration thrombectomy and PCI with VeriFlex BMS 5.0 mm x 24 mm (5.6 mm; also PTCA of distal RPL 4 distal embolization.  . Dental extractions    . LEFT HEART CATHETERIZATION WITH CORONARY ANGIOGRAM N/A 11/17/2012   Procedure: LEFT HEART CATHETERIZATION WITH CORONARY ANGIOGRAM;  Surgeon: Leonie Man, MD;  [facility] CATH LAB: for Inferior STEMI -> EF 50-55%-mod basal -apical inferior HK. (LCA- Normal) LM -very large -->Tortuous, wraparound LAD -> Large bifurcating D1. Cx -large, no-dom - large LatOM1, small AVG &2 small OMs.  RCA (Very Large) - m90% subtototal -> RPAV(large PL2) & rPDA mild distal embolization  . TRANSTHORACIC ECHOCARDIOGRAM  11/18/2012   Normal LV size, moderate reduced function 40-45% with severe HK-AK of the inferoseptal wall with incoordinate septal motion.  Benjamin [name] ECHOCARDIOGRAM  March 15   Moderately reduced LVEF of 40 at 45% with inferoseptal hypokinesis and high filling  pressures.  . TRANSTHORACIC ECHOCARDIOGRAM  04/2015   EF 55-60%.; Pericardial fat pad versus effusion; no tamponade    Current Meds  Medication Sig  . acetaminophen (TYLENOL) 500 MG tablet Take by mouth every 6 (six) hours as needed for pain.  [name] albuterol (PROVENTIL HFA;VENTOLIN HFA) 108 (90 BASE) MCG/ACT inhaler Inhale 2 puffs into the lungs every 6 (six) hours as needed for wheezing.  [name] atorvastatin (LIPITOR) 40 MG tablet TAKE ONE TABLET BY MOUTH EVERY DAY  . bisoprolol (ZEBETA) 5 MG tablet TAKE 1/2 TABLET BY MOUTH EVERY DAY  . clopidogrel (PLAVIX) 75 MG tablet Take 1 tablet (75 mg total) by mouth daily.  . Coenzyme Q10 (CO Q-10) 100 MG CAPS Take 300 mg by mouth daily.  [name] Dextromethorphan-Guaifenesin (MUCINEX DM MAXIMUM STRENGTH PO) Take 1 tablet by mouth every 4 (four) hours as needed (congestion).   [name] ELIQUIS 5 MG TABS tablet TAKE 1 TABLET BY MOUTH TWICE DAILY  . ferrous sulfate 325 (65 FE) MG tablet Take 325 mg by mouth 4 (four) times daily.  . furosemide (LASIX) 40 MG tablet TAKE ONE TO TWO  TABLETS BY MOUTH EVERY MORNING AS NEEDED AND TAKE 1 TABLET BY MOUTH EVERY EVENING  . levalbuterol (XOPENEX) 0.63 MG/3ML nebulizer solution Take 3 mLs (0.63 mg total) by nebulization every 3 (three) hours as needed for wheezing or shortness of breath.  . losartan (COZAAR) 50 MG tablet TAKE ONE TABLET BY MOUTH EVERY DAY  . nitroGLYCERIN (NITROSTAT) 0.4 MG SL tablet Place 1 tablet (0.4 mg total) under the tongue every 5 (five) minutes x 3 doses as needed for chest pain.  Donell Sievert IN Inhale into the lungs. Using 4 liters nasal cannula ,continuous  . potassium chloride (K-DUR,KLOR-CON) 10 MEQ tablet TAKE ONE TABLET BY MOUTH EVERY DAY  . sodium chloride (OCEAN) 0.65 % SOLN nasal spray Place 1 spray into both nostrils as needed for congestion.  [name] SPIRIVA HANDIHALER 18 MCG inhalation capsule Place 18 mcg into inhaler and inhale daily.   [name] sulfamethoxazole-trimethoprim (BACTRIM DS,SEPTRA DS) 800-160 MG  tablet Take 1 tablet by mouth 2 (two) times daily as needed (for sickness).   . SYMBICORT 160-4.5 MCG/ACT inhaler Inhale 2 puffs into the lungs 2 (two) times daily.     Allergies  Allergen Reactions  . Codeine     Red blotches  . Penicillins     Childhood reaction    Social History   Socioeconomic History  . Marital status: Divorced    Spouse name: None  . Number of children: None  . Years of education: None  . Highest education level: None  Social Needs  . Financial resource strain: None  . Food insecurity - worry: None  . Food insecurity - inability: None  . Transportation needs - medical: None  . Transportation needs - non-medical: None  Occupational History  . Occupation: Disabled secondary to COPD  Tobacco Use  . Smoking status: Former Smoker    Packs/day: 1.00    Years: 40.00    Pack years: 40.00  . Smokeless tobacco: Never Used  . Tobacco comment: [address] 10/2102  Substance and Sexual Activity  . Alcohol use: No  . Drug use: No  . Sexual activity: None  Other Topics Concern  . None  Social History Narrative   Divorced father of 2 (one daughter one son).   Lives alone, but his daughter who lives nearby is "watching him like a Engineer, drilling ".   He quit smoking during his hospitalization after a 40-pack-year smoking history.   He is now gradually building up his exercise level walking 6 minutes at a time several times a day. He really a waiting Cardiac Rehabilitation.   Does not drink alcohol.    family history includes Cancer in his sister; Heart attack in his father and mother.  Wt Readings from Last 3 Encounters:  12/31/16 225 lb 6.4 oz (102.2 kg)  07/02/16 225 lb 6.4 oz (102.2 kg)  01/02/16 232 lb (105.2 kg)    PHYSICAL EXAM BP 120/76   Pulse 68   Ht 5\' 11"  (1.803 m)   Wt 225 lb 6.4 oz (102.2 kg)   SpO2 98%   BMI 31.44 kg/m   Physical Exam  Constitutional: He is oriented to person, place, and time. He appears well-developed and  well-nourished. No distress.  HENT:  Head: Normocephalic and atraumatic.  Neck: No hepatojugular reflux and no JVD present. Carotid bruit is not present.  Cardiovascular: Normal rate, regular rhythm, S1 normal, S2 normal and intact distal pulses.  No extrasystoles are present. PMI is not displaced. Exam reveals distant heart  sounds. Exam reveals no gallop and no friction rub.  No murmur heard. Barely audible S1 & S2.   Pulmonary/Chest: Effort normal. No respiratory distress. He has wheezes (Diffuse late expiratory wheezing.). He has no rales.  Baseline accessory muscle use. On O2 concentrator.Looks comfortable   Very diminished BS  Abdominal: Soft. Bowel sounds are normal. He exhibits no distension. There is no tenderness. There is no rebound.  Musculoskeletal: Normal range of motion. He exhibits no edema.  Neurological: He is alert and oriented to person, place, and time.  Skin: Skin is warm and dry. No rash noted. No erythema.  Psychiatric: He has a normal mood and affect. His behavior is normal. Judgment and thought content normal.     Adult ECG Report  not checked  Other studies Reviewed: Additional studies/ records that were reviewed today include:  Recent Labs:  PCP (due for check up in Jan) Lab Results  Component Value Date   CHOL 154 06/27/2016   HDL 71 06/27/2016   LDLCALC 61 06/27/2016   TRIG 109 06/27/2016   CHOLHDL 2.2 06/27/2016   Lab Results  Component Value Date   CREATININE 0.99 06/27/2016   BUN 22 06/27/2016   NA 138 06/27/2016   K 4.0 06/27/2016   CL 96 (L) 06/27/2016   CO2 32 (H) 06/27/2016    ASSESSMENT / PLAN:  Overall Teigen looks much better than his baseline has been.  He is very well groomed.  He is very happy on his oxygen concentrator.  He is trying to do his best exercise.  Today's appearance clinically, emotionally, socially is probably the best I have seen him in the 4 years that I have known to him.  I think he is finally stable enough for [place]  to talk about Annual follow-ups as opposed t to 6 months.   Problem List Items Addressed This Visit    CAD S/P percutaneous coronary angioplasty - PCI mid RCA - 5.0 mm x 24 mm VeriFlex BMS (5.6 mm) - Primary (Chronic)    History of PCI with large bare-metal stent to the RCA in the setting of an inferior STEMI back in 2014.  He has not had any further anginal symptoms.  Very large bare-metal stent.  He remains on Plavix which we could probably switch him simply to aspirin next visit since he is on full dose Eliquis. He is on a beta-blocker and statin along with ARB.  Stable doses.        Chronic diastolic HF (heart failure), NYHA class 2 ([facility]) (Chronic)    His EF improved much back to baseline after initial drop post MI.  Still has standing diastolic dysfunction but doing well on current dose of bisoprolol and losartan.  He is on standing furosemide that he usually takes one once a day with rarely take an additional dose. Stable.  No change      Dyslipidemia, goal LDL below 70 (Chronic)    Lipid panel from May looks great on current dose of atorvastatin.  No changes for now.  Continue with co-Q10 for symptom relief.  No myalgias.      Essential hypertension (Chronic)    Well-controlled on current medications.  He was borderline hypotensive and I saw him last time, much better now.  We talked about holding medications for when he felt dizzy.  He has not had to do so recently.  We also split up his dosing of the beta-blocker versus ARB.  Since doing this he has not had  any further issues.      Paroxysmal Atrial fibrillation with RVR - peri-MI; recurrent after DCCV (Chronic)    As far as I can tell, no recurrence since his MI cardioversion.  Despite this, with his ongoing risk (This patients CHA2DS2-VASc Score and unadjusted Ischemic Stroke Rate (% per year) is equal to 3.2 % stroke rate/year from a score of 3) he remains on Eliquis. No bleeding issues.  On stable dose of loll for rate control.        STEMI of inferior wall 11/18/12- RCA BMS (Chronic)      Current medicines are reviewed at length with the patient today. (+/- concerns) n/a The following changes have been made: n/a  Patient Instructions  NO CHANGE WITH CURRENT MEDICATIONS.      Your physician wants you to follow-up in [place].You will receive a reminder letter in the mail two months in advance. If you don't receive a letter, please call our office to schedule the follow-up appointment.     If you need a refill on your cardiac medications before your next appointment, please call your pharmacy.    Studies Ordered:   No orders of the defined types were placed in this encounter.     Glenetta Hew, M.D., M.S. Interventional Cardiologist   Pager # [phone] Phone # [phone] [address] [place], [place] [address]

## 2016-12-31 NOTE — Patient Instructions (Signed)
NO CHANGE WITH CURRENT MEDICATIONS   Your physician wants you to follow-up in 12 MONTHS WITH DR HARDING. You will receive a reminder letter in the mail two months in advance. If you don't receive a letter, please call our office to schedule the follow-up appointment.     If you need a refill on your cardiac medications before your next appointment, please call your pharmacy.  

## 2017-01-01 ENCOUNTER — Encounter: Payer: Self-pay | Admitting: Cardiology

## 2017-01-01 NOTE — Assessment & Plan Note (Addendum)
As far as I can tell, no recurrence since his MI cardioversion.  Despite this, with his ongoing risk (This patients CHA2DS2-VASc Score and unadjusted Ischemic Stroke Rate (% per year) is equal to 3.2 % stroke rate/year from a score of 3) he remains on Eliquis. No bleeding issues.  On stable dose of loll for rate control.

## 2017-01-01 NOTE — Assessment & Plan Note (Signed)
Well-controlled on current medications.  He was borderline hypotensive and I saw him last time, much better now.  We talked about holding medications for when he felt dizzy.  He has not had to do so recently.  We also split up his dosing of the beta-blocker versus ARB.  Since doing this he has not had any further issues.

## 2017-01-01 NOTE — Assessment & Plan Note (Addendum)
History of PCI with large bare-metal stent to the RCA in the setting of an inferior STEMI back in 2014.  He has not had any further anginal symptoms.  Very large bare-metal stent.  He remains on Plavix which we could probably switch him simply to aspirin next visit since he is on full dose Eliquis. He is on a beta-blocker and statin along with ARB.  Stable doses.

## 2017-01-01 NOTE — Assessment & Plan Note (Signed)
Lipid panel from May looks great on current dose of atorvastatin.  No changes for now.  Continue with co-Q10 for symptom relief.  No myalgias.

## 2017-01-01 NOTE — Assessment & Plan Note (Addendum)
His EF improved much back to baseline after initial drop post MI.  Still has standing diastolic dysfunction but doing well on current dose of bisoprolol and losartan.  He is on standing furosemide that he usually takes one once a day with rarely take an additional dose. Stable.  No change

## 2017-01-14 ENCOUNTER — Other Ambulatory Visit: Payer: Self-pay | Admitting: Cardiology

## 2017-01-14 DIAGNOSIS — J449 Chronic obstructive pulmonary disease, unspecified: Secondary | ICD-10-CM | POA: Diagnosis not present

## 2017-01-14 DIAGNOSIS — J9611 Chronic respiratory failure with hypoxia: Secondary | ICD-10-CM | POA: Diagnosis not present

## 2017-01-25 ENCOUNTER — Other Ambulatory Visit: Payer: Self-pay | Admitting: Cardiology

## 2017-02-14 DIAGNOSIS — J9611 Chronic respiratory failure with hypoxia: Secondary | ICD-10-CM | POA: Diagnosis not present

## 2017-02-14 DIAGNOSIS — J449 Chronic obstructive pulmonary disease, unspecified: Secondary | ICD-10-CM | POA: Diagnosis not present

## 2017-03-11 DIAGNOSIS — I10 Essential (primary) hypertension: Secondary | ICD-10-CM | POA: Diagnosis not present

## 2017-03-11 DIAGNOSIS — J449 Chronic obstructive pulmonary disease, unspecified: Secondary | ICD-10-CM | POA: Diagnosis not present

## 2017-03-11 DIAGNOSIS — I2510 Atherosclerotic heart disease of native coronary artery without angina pectoris: Secondary | ICD-10-CM | POA: Diagnosis not present

## 2017-03-11 DIAGNOSIS — I4891 Unspecified atrial fibrillation: Secondary | ICD-10-CM | POA: Diagnosis not present

## 2017-03-17 DIAGNOSIS — J9611 Chronic respiratory failure with hypoxia: Secondary | ICD-10-CM | POA: Diagnosis not present

## 2017-03-17 DIAGNOSIS — J449 Chronic obstructive pulmonary disease, unspecified: Secondary | ICD-10-CM | POA: Diagnosis not present

## 2017-04-14 DIAGNOSIS — J9611 Chronic respiratory failure with hypoxia: Secondary | ICD-10-CM | POA: Diagnosis not present

## 2017-04-14 DIAGNOSIS — J449 Chronic obstructive pulmonary disease, unspecified: Secondary | ICD-10-CM | POA: Diagnosis not present

## 2017-05-15 ENCOUNTER — Other Ambulatory Visit: Payer: Self-pay | Admitting: Cardiology

## 2017-05-15 ENCOUNTER — Other Ambulatory Visit: Payer: Self-pay | Admitting: Cardiovascular Disease

## 2017-05-15 DIAGNOSIS — J449 Chronic obstructive pulmonary disease, unspecified: Secondary | ICD-10-CM | POA: Diagnosis not present

## 2017-05-15 DIAGNOSIS — J9611 Chronic respiratory failure with hypoxia: Secondary | ICD-10-CM | POA: Diagnosis not present

## 2017-06-08 IMAGING — CR DG CHEST 1V PORT
1 series · 1 of 1 positions shown · non-contrast
Comparison: 03/08/2015

CLINICAL DATA: Shortness of breath for 2 days

EXAM:
PORTABLE CHEST 1 VIEW

[ap portable]
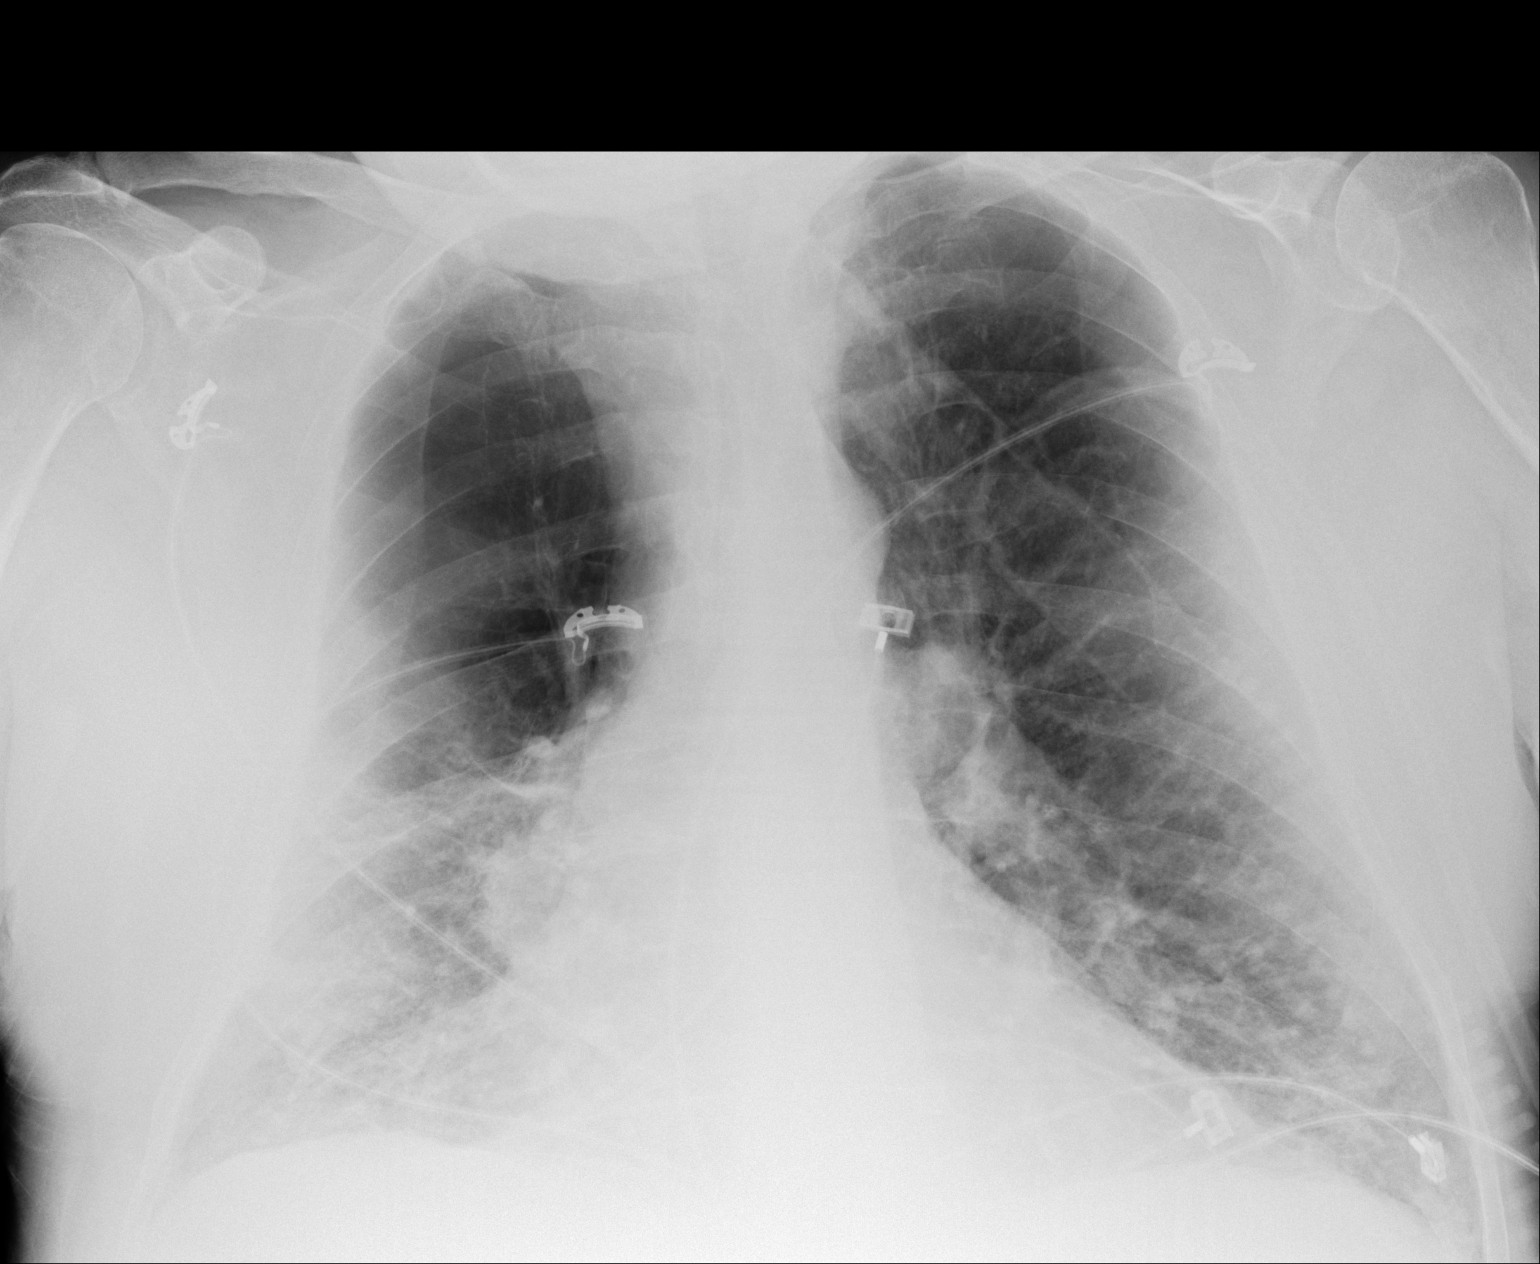

[1 of 1 positions shown; findings below may reference images not displayed]

FINDINGS: Cardiac shadow is stable. Diffuse emphysematous changes are seen.
Increased density is noted in the bases bilaterally consistent with
some bibasilar atelectasis/infiltrate. No definitive effusion or
pneumothorax is noted.
IMPRESSION: Emphysematous changes with bibasilar opacities

## 2017-06-08 IMAGING — CR DG CHEST 1V PORT
2 series · 2 of 2 positions shown · non-contrast
Comparison: 05/26/2015

CLINICAL DATA: Endotracheal tube adjustment

EXAM:
PORTABLE CHEST 1 VIEW

[AP (1 of 2)]
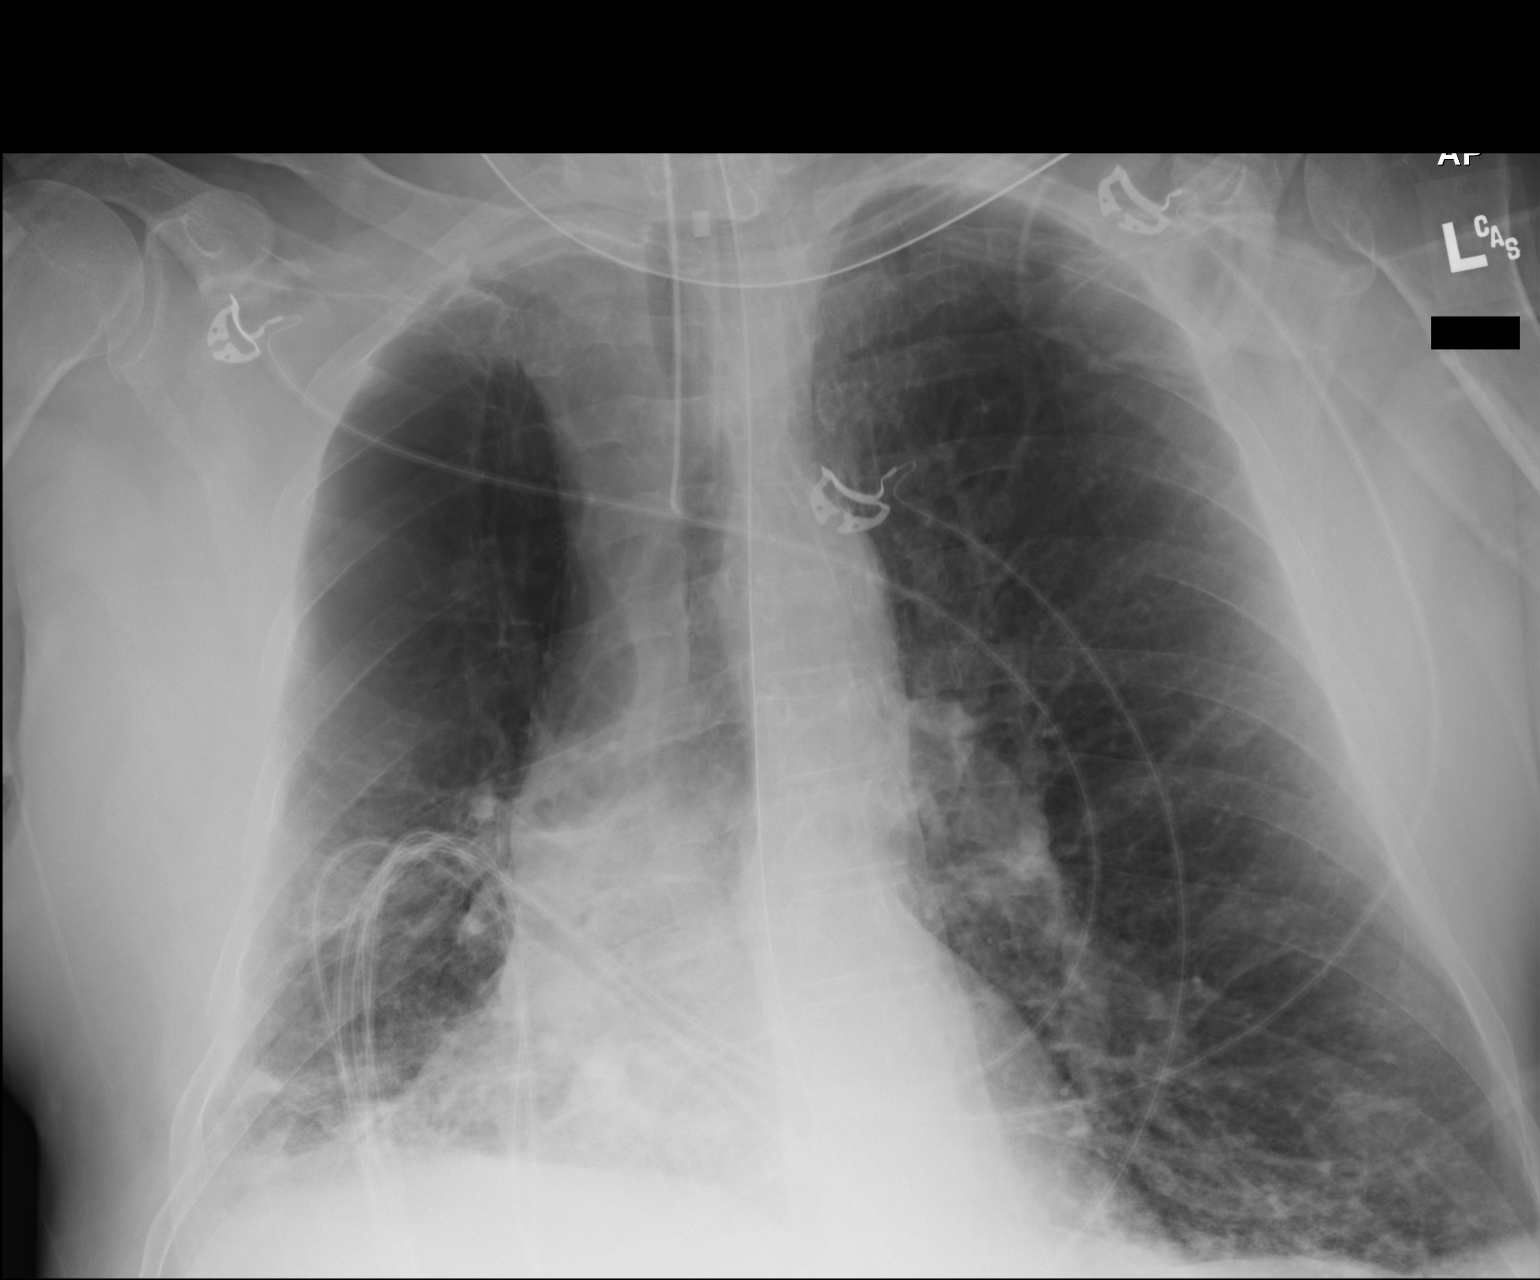

[AP (2 of 2)]
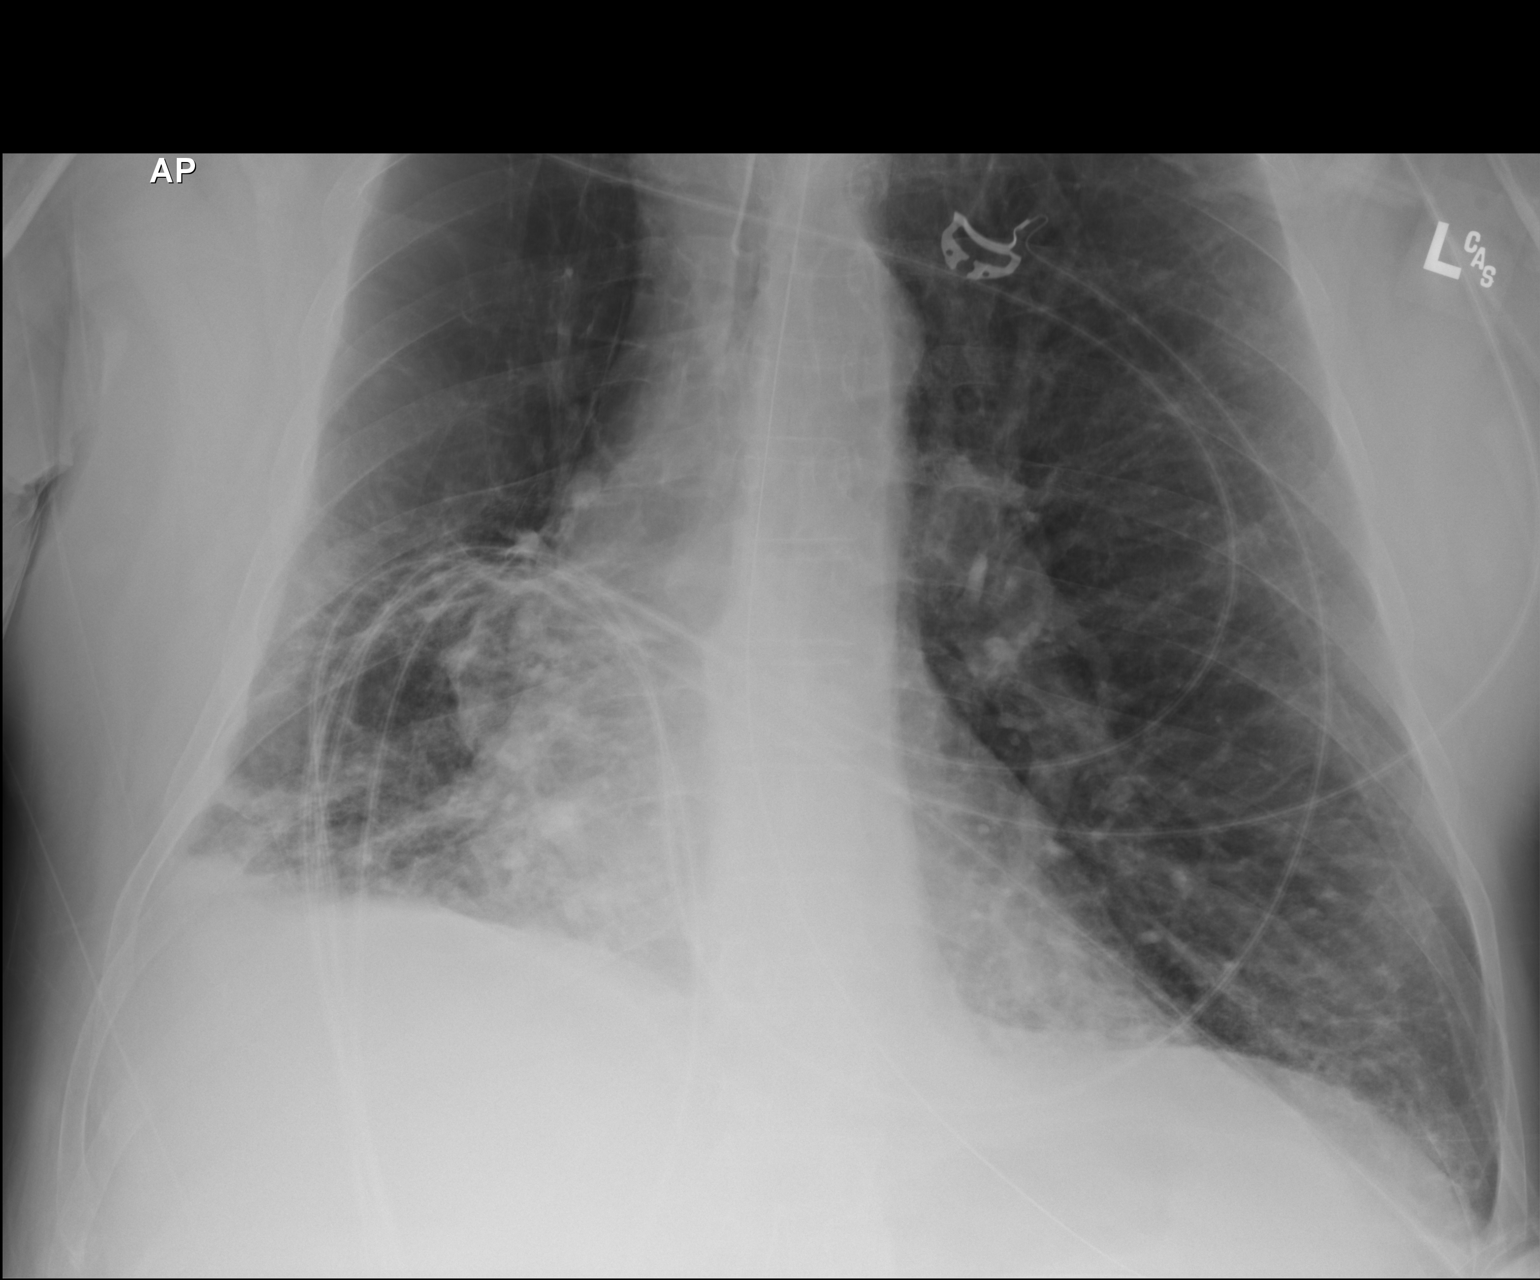

[2 of 2 positions shown; findings below may reference images not displayed]

FINDINGS: Advanced endotracheal tube, tip in good position between the
clavicular heads and carina.

An orogastric tube reaches the stomach at least.

Emphysema with bibasilar pneumonia. Stable heart size and
mediastinal contours, distorted by rightward rotation. No effusion
or air leak. Remote appearing left posterior third rib fracture
IMPRESSION: 1. Endotracheal and orogastric tubes are in good position.
2. Emphysema and bibasilar pneumonia.

## 2017-06-08 IMAGING — CR DG CHEST 1V PORT
1 series · 1 of 1 positions shown · non-contrast
Comparison: 05/26/2015 at 5294 hours

CLINICAL DATA: ET TUBE PLACEMENT, OG TUBE PLACEMENT

EXAM:
PORTABLE CHEST 1 VIEW

[ap portable]
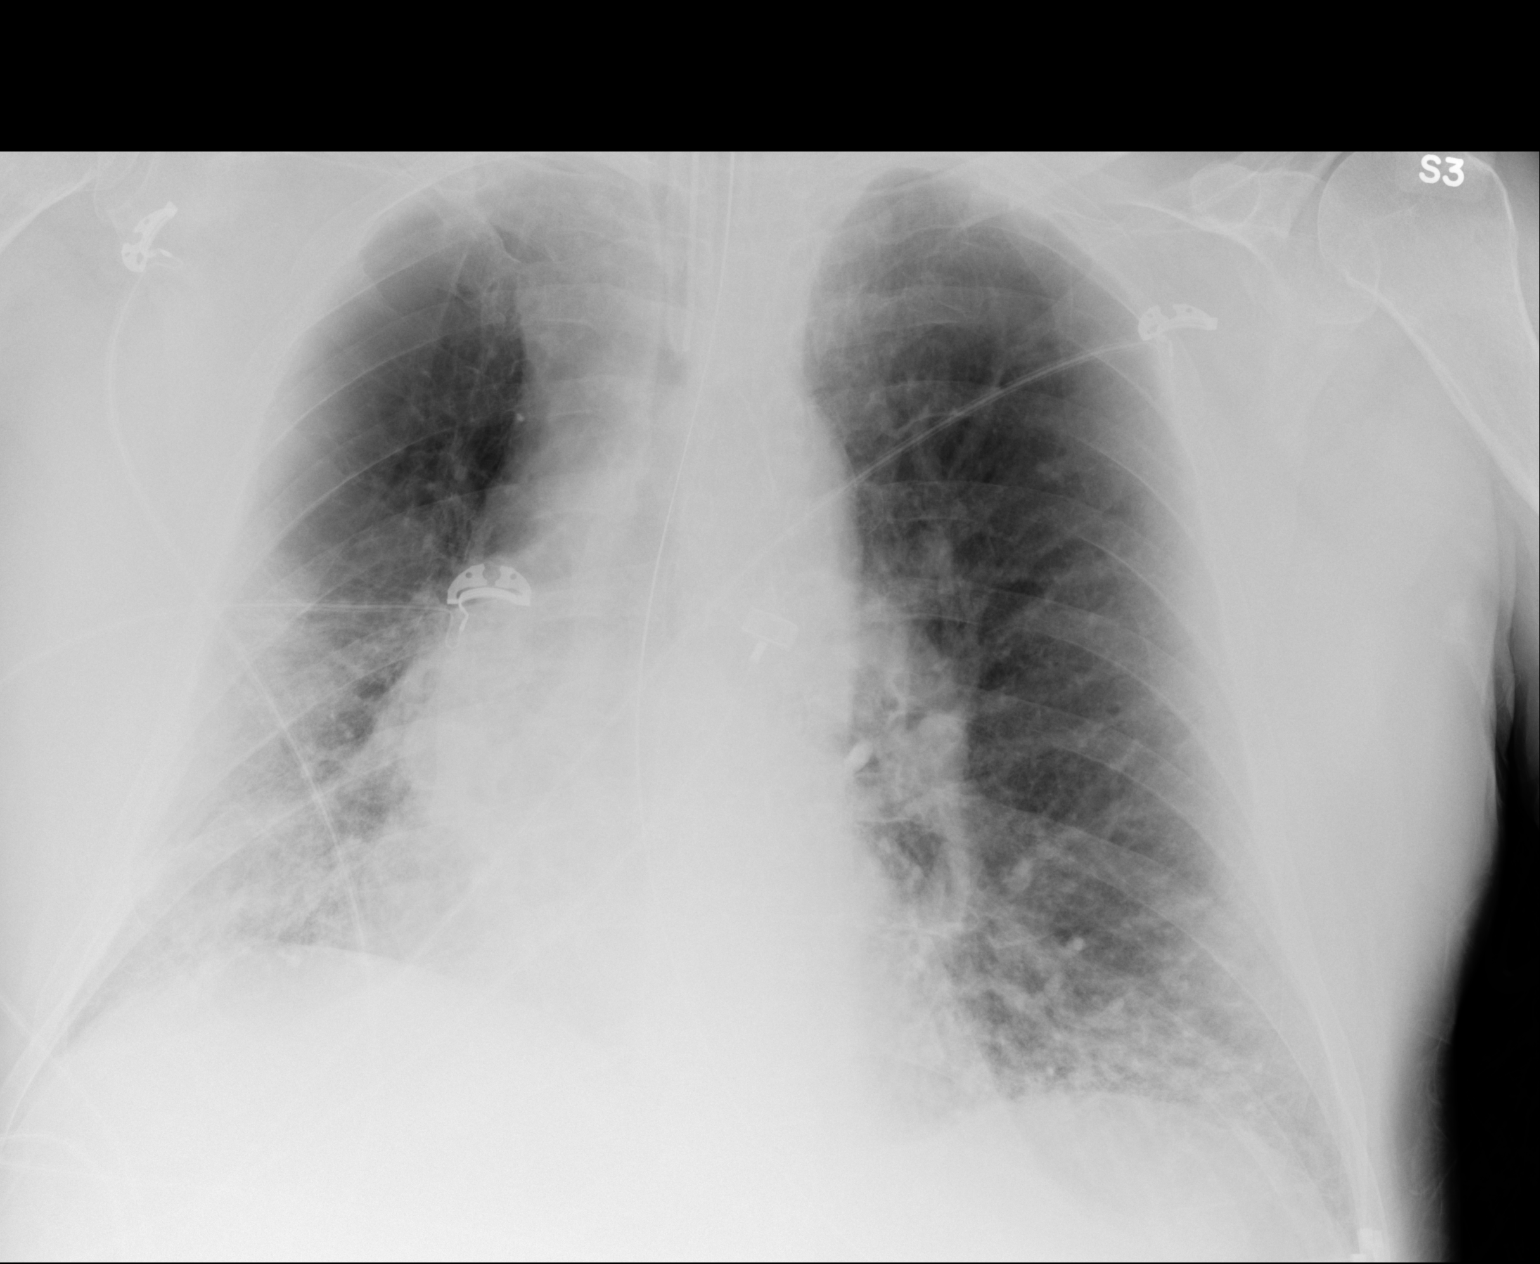

[1 of 1 positions shown; findings below may reference images not displayed]

FINDINGS: New endotracheal tube has its tip projecting 5.6 cm above the
carina.

Nasal/orogastric tube passes below the diaphragm into the stomach.
New

Changes of emphysema and lung base airspace interstitial opacities
are without change from the earlier study.
IMPRESSION: 1. Endotracheal tube tip projects 5.6 cm above the carina.
2. Nasal/orogastric tube is well positioned passing into the
stomach.

## 2017-06-09 IMAGING — CR DG CHEST 1V PORT
2 series · 2 of 2 positions shown · non-contrast
Comparison: 05/26/2015 and 03/08/2015

CLINICAL DATA: Respiratory failure.

EXAM:
PORTABLE CHEST 1 VIEW

[AP (1 of 2)]
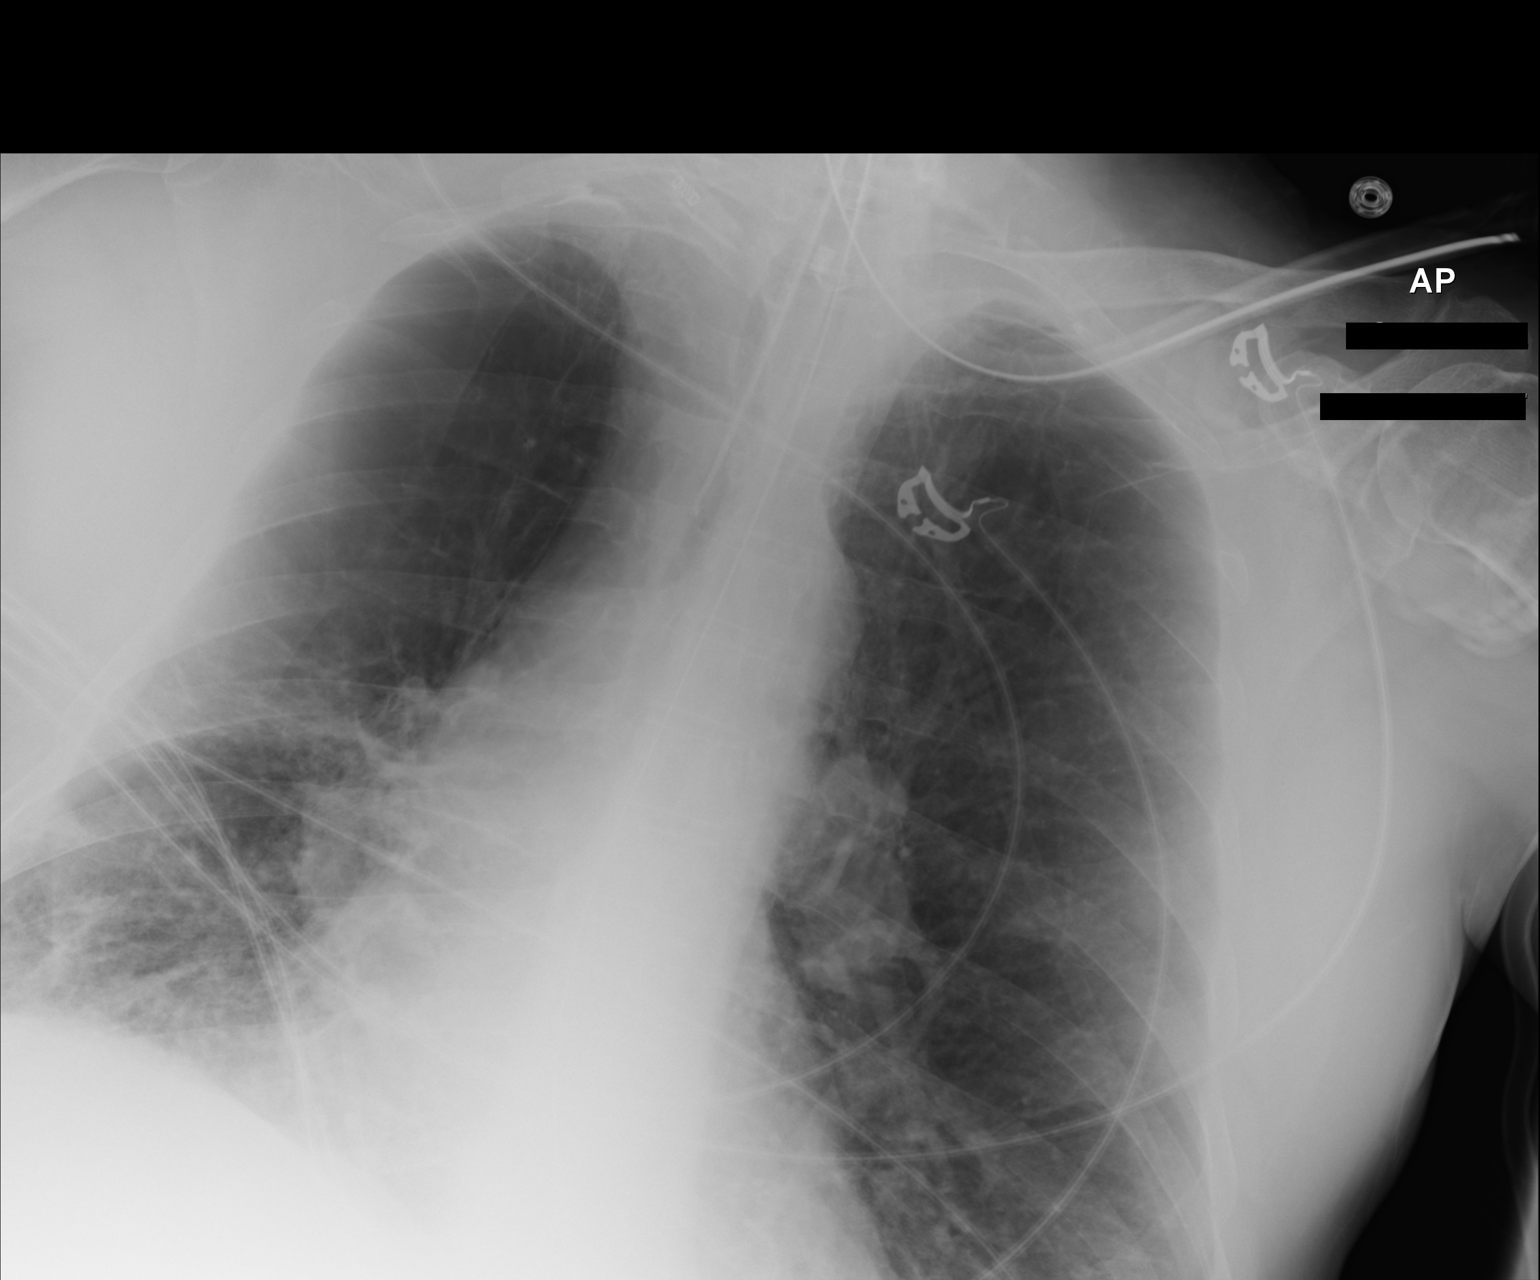

[AP (2 of 2)]
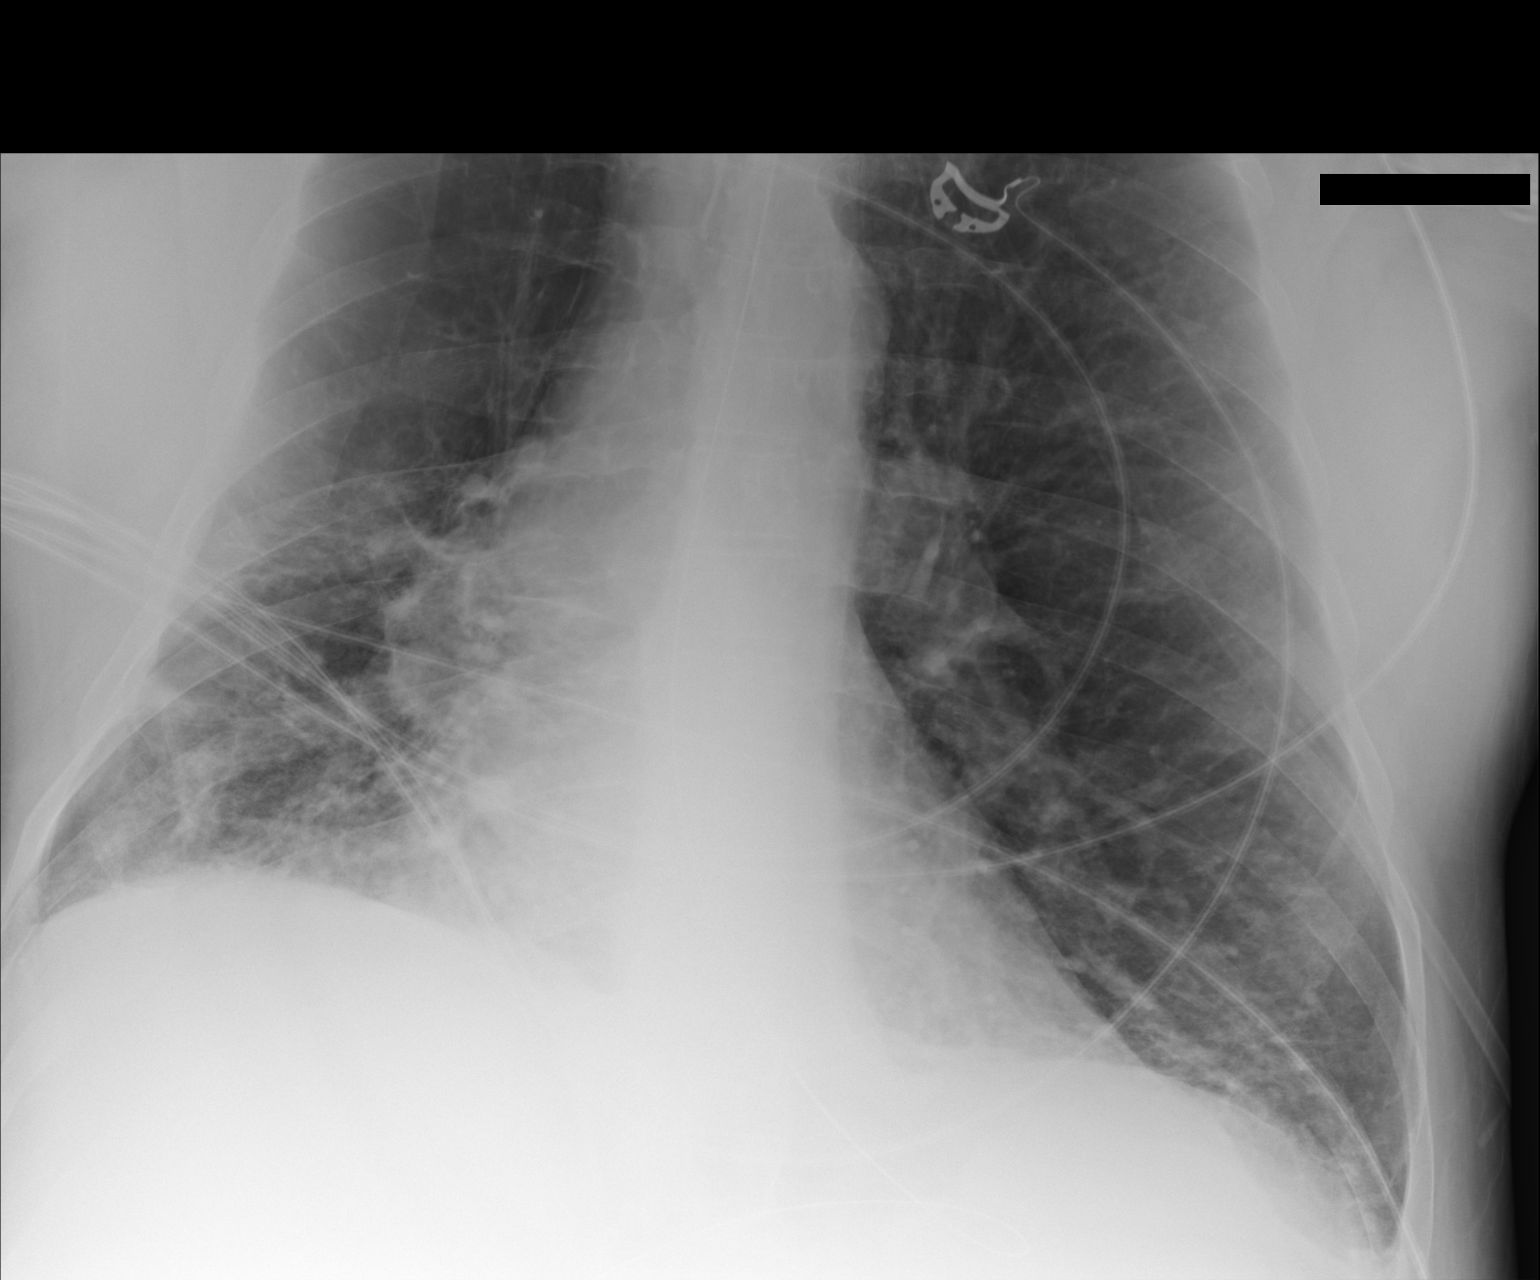

[2 of 2 positions shown; findings below may reference images not displayed]

FINDINGS: Endotracheal tube and OG tube appear unchanged. Severe emphysematous
disease is again noted.

There has been slight improvement in the infiltrate at the right
lung base. No significant change of the infiltrate in the left base,
both superimposed on chronic lung disease.
IMPRESSION: Severe emphysema with bibasilar infiltrates, slightly improved on
the right.

## 2017-06-11 ENCOUNTER — Other Ambulatory Visit: Payer: Self-pay | Admitting: Cardiology

## 2017-06-14 DIAGNOSIS — J9611 Chronic respiratory failure with hypoxia: Secondary | ICD-10-CM | POA: Diagnosis not present

## 2017-06-14 DIAGNOSIS — J449 Chronic obstructive pulmonary disease, unspecified: Secondary | ICD-10-CM | POA: Diagnosis not present

## 2017-07-15 DIAGNOSIS — I10 Essential (primary) hypertension: Secondary | ICD-10-CM | POA: Diagnosis not present

## 2017-07-15 DIAGNOSIS — J9611 Chronic respiratory failure with hypoxia: Secondary | ICD-10-CM | POA: Diagnosis not present

## 2017-07-15 DIAGNOSIS — Z1389 Encounter for screening for other disorder: Secondary | ICD-10-CM | POA: Diagnosis not present

## 2017-07-15 DIAGNOSIS — E748 Other specified disorders of carbohydrate metabolism: Secondary | ICD-10-CM | POA: Diagnosis not present

## 2017-07-15 DIAGNOSIS — Z0001 Encounter for general adult medical examination with abnormal findings: Secondary | ICD-10-CM | POA: Diagnosis not present

## 2017-07-15 DIAGNOSIS — I2510 Atherosclerotic heart disease of native coronary artery without angina pectoris: Secondary | ICD-10-CM | POA: Diagnosis not present

## 2017-07-15 DIAGNOSIS — E782 Mixed hyperlipidemia: Secondary | ICD-10-CM | POA: Diagnosis not present

## 2017-07-15 DIAGNOSIS — Z13220 Encounter for screening for lipoid disorders: Secondary | ICD-10-CM | POA: Diagnosis not present

## 2017-07-15 DIAGNOSIS — J449 Chronic obstructive pulmonary disease, unspecified: Secondary | ICD-10-CM | POA: Diagnosis not present

## 2017-09-09 DIAGNOSIS — J9611 Chronic respiratory failure with hypoxia: Secondary | ICD-10-CM | POA: Diagnosis not present

## 2017-09-09 DIAGNOSIS — I4891 Unspecified atrial fibrillation: Secondary | ICD-10-CM | POA: Diagnosis not present

## 2017-09-09 DIAGNOSIS — I2510 Atherosclerotic heart disease of native coronary artery without angina pectoris: Secondary | ICD-10-CM | POA: Diagnosis not present

## 2017-09-09 DIAGNOSIS — J449 Chronic obstructive pulmonary disease, unspecified: Secondary | ICD-10-CM | POA: Diagnosis not present

## 2017-10-15 DIAGNOSIS — J449 Chronic obstructive pulmonary disease, unspecified: Secondary | ICD-10-CM | POA: Diagnosis not present

## 2017-11-12 ENCOUNTER — Other Ambulatory Visit: Payer: Self-pay | Admitting: Cardiology

## 2017-11-14 DIAGNOSIS — J449 Chronic obstructive pulmonary disease, unspecified: Secondary | ICD-10-CM | POA: Diagnosis not present

## 2017-11-15 DIAGNOSIS — I4891 Unspecified atrial fibrillation: Secondary | ICD-10-CM | POA: Diagnosis not present

## 2017-11-15 DIAGNOSIS — I10 Essential (primary) hypertension: Secondary | ICD-10-CM | POA: Diagnosis not present

## 2017-11-15 DIAGNOSIS — J9611 Chronic respiratory failure with hypoxia: Secondary | ICD-10-CM | POA: Diagnosis not present

## 2017-11-15 DIAGNOSIS — J441 Chronic obstructive pulmonary disease with (acute) exacerbation: Secondary | ICD-10-CM | POA: Diagnosis not present

## 2017-12-09 ENCOUNTER — Other Ambulatory Visit: Payer: Self-pay | Admitting: Cardiology

## 2017-12-15 DIAGNOSIS — J449 Chronic obstructive pulmonary disease, unspecified: Secondary | ICD-10-CM | POA: Diagnosis not present

## 2018-01-08 ENCOUNTER — Other Ambulatory Visit: Payer: Self-pay | Admitting: Cardiology

## 2018-01-10 ENCOUNTER — Encounter: Payer: Self-pay | Admitting: Cardiology

## 2018-01-10 ENCOUNTER — Ambulatory Visit: Payer: Medicare Other | Admitting: Cardiology

## 2018-01-10 VITALS — BP 126/78 | HR 72 | Ht 71.0 in | Wt 230.0 lb

## 2018-01-10 DIAGNOSIS — E669 Obesity, unspecified: Secondary | ICD-10-CM

## 2018-01-10 DIAGNOSIS — I255 Ischemic cardiomyopathy: Secondary | ICD-10-CM | POA: Diagnosis not present

## 2018-01-10 DIAGNOSIS — I2119 ST elevation (STEMI) myocardial infarction involving other coronary artery of inferior wall: Secondary | ICD-10-CM | POA: Diagnosis not present

## 2018-01-10 DIAGNOSIS — I2510 Atherosclerotic heart disease of native coronary artery without angina pectoris: Secondary | ICD-10-CM

## 2018-01-10 DIAGNOSIS — I480 Paroxysmal atrial fibrillation: Secondary | ICD-10-CM

## 2018-01-10 DIAGNOSIS — E785 Hyperlipidemia, unspecified: Secondary | ICD-10-CM

## 2018-01-10 DIAGNOSIS — Z9861 Coronary angioplasty status: Secondary | ICD-10-CM

## 2018-01-10 DIAGNOSIS — I10 Essential (primary) hypertension: Secondary | ICD-10-CM

## 2018-01-10 DIAGNOSIS — I5032 Chronic diastolic (congestive) heart failure: Secondary | ICD-10-CM | POA: Diagnosis not present

## 2018-01-10 DIAGNOSIS — E66811 Obesity, class 1: Secondary | ICD-10-CM

## 2018-01-10 MED ORDER — ROSUVASTATIN CALCIUM 40 MG PO TABS: 40.0000 mg | ORAL_TABLET | Freq: Every day | ORAL | 3 refills | Status: DC

## 2018-01-10 NOTE — Patient Instructions (Addendum)
Medication Instructions:   STOP TAKING PLAVIX - CLOPIDOGREL STOP TAKING ATORVASTATIN 40 MG  START  ROSUVASTATIN 40 MG ONE TABLET DAILY If you need a refill on your cardiac medications before your next appointment, please call your pharmacy.   Lab work: NOT NEEDED If you have labs (blood work) drawn today and your tests are completely normal, you will receive your results only by: [name] MyChart Message (if you have MyChart) OR . A paper copy in the mail If you have any lab test that is abnormal or we need to change your treatment, we will call you to review the results.  Testing/Procedures: NOT NEEDED  Follow-Up: At [facility], you and your health needs are our priority.  As part of our continuing mission to provide you with exceptional heart care, we have created designated Provider Care Teams.  These Care Teams include your primary Cardiologist (physician) and Advanced Practice Providers (APPs -  Physician Assistants and Nurse Practitioners) who all work together to provide you with the care you need, when you need it. You will need a follow up appointment in 12 months.  Please call our office 2 months in advance to schedule this appointment.  You may see Glenetta Hew, MD or one of the following Advanced Practice Providers on your designated Care Team:   Rosaria Ferries, PA-C . Jory Sims, DNP, ANP  Any Other Special Instructions Will Be Listed Below (If Applicable).

## 2018-01-10 NOTE — Progress Notes (Signed)
PCP: Redmond School, MD  Clinic Note: Chief Complaint  Patient presents with  . Follow-up    12 months.  Just started treatment for URI (prednisone and steroids)  . Coronary Artery Disease    No angina  . Atrial Fibrillation    No recurrence    HPI: Benjamin [name] is a 62 y.o. male with a PMH notable for Inferior STEMI-CAD-PCI with resolved ICM, PAF & severe COPD on home O2 who presents today for annual f/u (accompanied, as usual by his daughter).   CAD: Inferior STEMI October 2014 very large RCA occluded, cardiac shock and atrial fibrillation requiring cardioversion. ? PCL very large BMS stent 5 x 24 mm postdilated to 5.5 mm ? Moderate Ischemic Cardiomyopathy with EF of 40-45% --> improved to 55-60% -> chronic HFpEF  PAF - no recurrence in > 18 months. - On Eliquis & Bisoprolol.  COPD - on home O2 4L.   [name] was last seen on Dec 31, 2016.  Doing well.  Was trying to lose weight by eating healthy.  Was in great spirits.  Happy that he was on his home oxygen concentrator.  Acknowledges that he does not do well in very hot or very cold.  Stable use of intermittent Lasix.  No symptoms of A. fib.  Recent Hospitalizations:  none  Studies Personally Reviewed - (if available, images/films reviewed: From Epic Chart or Care Everywhere)  none  Interval History: Benjamin [name] returns today again doing quite well.  No major complaints.  He has gone to taking his Lasix 1 tablet daily and may be every so often will take an additional dose, but not more than that.  Edema is pretty well controlled.  He does have some pretty significant bruising on the combination of clopidogrel and Eliquis and asked about potentially stopping 1 of them.  Other than his baseline exertional dyspnea which is pretty stable, he has gotten a little bit "lazy and a little bit lax on his diet" and therefore has gained back some weight.  He is hoping that once the current holiday season is complete, he can  get back into an exercise regimen.  He is Frustrated with himself but otherwise doing well from a cardiac standpoint.  He has no PND, or orthopnea, with only mild trace edema.  He may have a few skipped beats here and there, but certainly has no sensation whatsoever of recurrent A. fib symptoms with rapid irregular heartbeats.  No syncope/near syncope or TIA/amaurosis fugax.  As long as he is taking care of his pulmonary issues, he seems like is stable walking with his current level of dyspnea.  He has not had any COPD exacerbations that have caused him to go to the hospital.  He is currently now starting cycle of prednisone and antibiotics for possible URI symptoms that seems to have already started getting better. He does not really walk enough to note claudication, but with what he does he does not notice any.  ROS: A comprehensive was performed. Review of Systems  Constitutional: Negative for malaise/fatigue (Just notes exertional dyspnea.) and weight loss (Has taken backward slide in his weight loss efforts.[name]).  HENT: Negative for congestion and nosebleeds.   Respiratory: Positive for cough (Chronic cough.  Usually better since quitting smoking.  However he is now having symptoms of a cold.), shortness of breath (at baseline) and wheezing (much less). Negative for sputum production.        He has his baseline COPD, but currently  dealing with a mild URI episode.  Cardiovascular: Negative for leg swelling.  Gastrointestinal: Negative for blood in stool and melena.  Genitourinary: Negative for hematuria.  Musculoskeletal: Positive for back pain and joint pain. Negative for falls.  Neurological: Negative for dizziness, focal weakness, seizures and loss of consciousness.  Endo/Heme/Allergies: Bruises/bleeds easily.  Psychiatric/Behavioral: Negative for depression and memory loss. The patient is not nervous/anxious and does not have insomnia.   All other systems reviewed and are negative.   I  have reviewed and (if needed) personally updated the patient's problem list, medications, allergies, past medical and surgical history, social and family history.   Past Medical History:  Diagnosis Date  . Atrial fibrillation with RVR - peri-MI; recurrent after DCCV 11/19/2012  . CAD S/P percutaneous coronary angioplasty  11/18/2012   - PCI mid RCA - 5.0 mm x 24 mm VeriFlex BMS (5.6 mm)  . Chronic combined systolic and diastolic heart failure, NYHA class 2 ([place]) 11/17/2012  . Colon polyp    Status post polypectomy x3.  [name] COPD (chronic obstructive pulmonary disease) ([place])   . Dyslipidemia- low HDL 11/19/2012  . Ischemic cardiomyopathy 11/19/2012   EF 40-45% with inferoseptal HK, Gr 2DD - high LVEDP/LAP. --> confirmed by f/u Echo 03/2013.  [name] STEMI of inferior wall 11/18/12- RCA BMS 11/17/2012   Cardiogenic shock - post MI with RV infarction [785.51]  . Tobacco abuse    40 Pk-yr (1- 1 1/2 PPD) --> Quit 11/18/2012 when presented with STEMI    Past Surgical History:  Procedure Laterality Date  . COLONOSCOPY N/A 11/20/2013   Procedure: COLONOSCOPY;  Surgeon: Jamesetta So, MD;  Location: [facility];  Service: Gastroenterology;  Laterality: N/A;  . CORONARY ANGIOPLASTY WITH STENT PLACEMENT Right 11/17/12   Mid RCA aspiration thrombectomy and PCI with VeriFlex BMS 5.0 mm x 24 mm (5.6 mm; also PTCA of distal RPL 4 distal embolization.  . Dental extractions    . LEFT HEART CATHETERIZATION WITH CORONARY ANGIOGRAM N/A 11/17/2012   Procedure: LEFT HEART CATHETERIZATION WITH CORONARY ANGIOGRAM;  Surgeon: Leonie Man, MD;  [facility] CATH LAB: for Inferior STEMI -> EF 50-55%-mod basal -apical inferior HK. (LCA- Normal) LM -very large -->Tortuous, wraparound LAD -> Large bifurcating D1. Cx -large, no-dom - large LatOM1, small AVG &2 small OMs.  RCA (Very Large) - m90% subtototal -> RPAV(large PL2) & rPDA mild distal embolization  . TRANSTHORACIC ECHOCARDIOGRAM  11/18/2012   Normal LV size, moderate  reduced function 40-45% with severe HK-AK of the inferoseptal wall with incoordinate septal motion.  Domingo Dimes ECHOCARDIOGRAM  March 15   Moderately reduced LVEF of 40 at 45% with inferoseptal hypokinesis and high filling pressures.  . TRANSTHORACIC ECHOCARDIOGRAM  04/2015   EF 55-60%.; Pericardial fat pad versus effusion; no tamponade    Current Meds  Medication Sig  . acetaminophen (TYLENOL) 500 MG tablet Take by mouth every 6 (six) hours as needed for pain.  [name] albuterol (PROVENTIL HFA;VENTOLIN HFA) 108 (90 BASE) MCG/ACT inhaler Inhale 2 puffs into the lungs every 6 (six) hours as needed for wheezing.  . bisoprolol (ZEBETA) 5 MG tablet TAKE 1/2 TABLET BY MOUTH EVERY DAY  . Coenzyme Q10 (CO Q-10) 100 MG CAPS Take 300 mg by mouth daily.  [name] ELIQUIS 5 MG TABS tablet TAKE 1 TABLET BY MOUTH TWICE DAILY  . furosemide (LASIX) 40 MG tablet TAKE ONE TO TWO TABLETS BY MOUTH EVERY MORNING AS NEEDED AND TAKE 1 TABLET BY MOUTH EVERY EVENING  . levalbuterol (  XOPENEX) 0.63 MG/3ML nebulizer solution Take 3 mLs (0.63 mg total) by nebulization every 3 (three) hours as needed for wheezing or shortness of breath.  . levofloxacin (LEVAQUIN) 500 MG tablet Take 500 mg by mouth daily.  [name] losartan (COZAAR) 50 MG tablet TAKE 1 TABLET BY MOUTH EVERY DAY  . nitroGLYCERIN (NITROSTAT) 0.4 MG SL tablet Place 1 tablet (0.4 mg total) under the tongue every 5 (five) minutes x 3 doses as needed for chest pain.  Donell Sievert IN Inhale into the lungs. Using 4 liters nasal cannula ,continuous  . potassium chloride (K-DUR,KLOR-CON) 10 MEQ tablet TAKE 1 TABLET BY MOUTH EVERY DAY  . predniSONE (DELTASONE) 10 MG tablet Take 10 mg by mouth daily with breakfast.  . sodium chloride (OCEAN) 0.65 % SOLN nasal spray Place 1 spray into both nostrils as needed for congestion.  [name] SPIRIVA HANDIHALER 18 MCG inhalation capsule Place 18 mcg into inhaler and inhale daily.   . SYMBICORT 160-4.5 MCG/ACT inhaler Inhale 2 puffs into the  lungs 2 (two) times daily.   . [DISCONTINUED] atorvastatin (LIPITOR) 40 MG tablet TAKE 1 TABLET BY MOUTH EVERY DAY  . [DISCONTINUED] clopidogrel (PLAVIX) 75 MG tablet TAKE ONE TABLET BY MOUTH DAILY    Allergies  Allergen Reactions  . Codeine     Red blotches  . Penicillins     Childhood reaction   Social History   Tobacco Use  . Smoking status: Former Smoker    Packs/day: 1.00    Years: 40.00    Pack years: 40.00  . Smokeless tobacco: Never Used  . Tobacco comment: [facility] 10/2102  Substance Use Topics  . Alcohol use: No  . Drug use: No   Social History   Social History Narrative   Divorced father of 2 (one daughter one son).   Lives alone, but his daughter who lives nearby is "watching him like a Engineer, drilling ".   He quit smoking during his hospitalization after a 40-pack-year smoking history.   He is now gradually building up his exercise level walking 6 minutes at a time several times a day. He really a waiting Cardiac Rehabilitation.   Does not drink alcohol.    Family History family history includes Cancer in his sister; Heart attack in his father and mother.  Wt Readings from Last 3 Encounters:  01/10/18 230 lb (104.3 kg)  12/31/16 225 lb 6.4 oz (102.2 kg)  07/02/16 225 lb 6.4 oz (102.2 kg)    PHYSICAL EXAM BP 126/78 (BP Location: Left Arm, Patient Position: Sitting, Cuff Size: Normal)   Pulse 72   Ht 5\' 11"  (1.803 m)   Wt 230 lb (104.3 kg)   BMI 32.08 kg/m   Physical Exam  Constitutional: He is oriented to person, place, and time. He appears well-developed and well-nourished. No distress.  Chronically ill gentleman.  Well-groomed, is on home oxygen.  HENT:  Head: Normocephalic and atraumatic.  Neck: Normal range of motion. Neck supple. No hepatojugular reflux and no JVD present. Carotid bruit is not present.  Cardiovascular: Normal rate, regular rhythm, S1 normal, S2 normal and intact distal pulses.  No extrasystoles are present. PMI is  not displaced. Exam reveals distant heart sounds. Exam reveals no gallop and no friction rub.  No murmur heard. Barely audible S1 & S2.   Pulmonary/Chest: Effort normal. No respiratory distress. He has wheezes (Diffuse late expiratory wheezing.). He has no rales.  Baseline accessory muscle use. On O2 concentrator.Looks comfortable   Very diminished  BS  Abdominal: Soft. Bowel sounds are normal. He exhibits no distension. There is no abdominal tenderness. There is no rebound.  Truncal obesity.  Unable to palpate HSM  Musculoskeletal: Normal range of motion.        General: Edema (Trivial) present.  Neurological: He is alert and oriented to person, place, and time.  Skin:  Mild diffuse ecchymosis on bilateral arms  Psychiatric: He has a normal mood and affect. His behavior is normal. Judgment and thought content normal.  Vitals reviewed.    Adult ECG Report  not checked  Other studies Reviewed: Additional studies/ records that were reviewed today include:  Recent Labs: From K PN dated 07/15/2017: TC 155, TG 155, HDL 46, LDL 78.  A1c 6.0.  BUN 16, Cr 0.79.  TSH 2.45    ASSESSMENT / PLAN:  Overall Wallis looks much better than his baseline has been.  He is very well groomed.  He is very happy on his oxygen concentrator.  He is trying to do his best exercise.  Today's appearance clinically, emotionally, socially is probably the best I have seen him in the 4 years that I have known to him.  I think he is finally stable enough for [place] to talk about Annual follow-ups as opposed t to 6 months.     Problem List Items Addressed This Visit    CAD S/P percutaneous coronary angioplasty - PCI mid RCA - 5.0 mm x 24 mm VeriFlex BMS (5.6 mm) - Primary (Chronic)    Large caliber BMS stent to RCA. As previously discussed, we will simply discontinue Plavix now continue Eliquis alone. Continue beta-blocker and statin along with ARB.      Relevant Medications   rosuvastatin (CRESTOR) 40 MG tablet    Cardiomyopathy, ischemic - EF 40-45%; now up to 55-60% (Chronic)    EF improved now.  Basically resolved.  Is on beta-blocker and ARB at current doses.  Relatively euvolemic on exam with standing low-dose Lasix with occasional doses PRN.      Relevant Medications   rosuvastatin (CRESTOR) 40 MG tablet   Other Relevant Orders   EKG 12-Lead   Chronic diastolic HF (heart failure), NYHA class 2 ([facility]) (Chronic)    Stable.  On good regimen of ARB and beta-blocker as most tolerated.  Relatively euvolemic on standing dose of Lasix.  Plan was to take an additional dose as needed for weight gain, dyspnea, orthopnea.      Relevant Medications   rosuvastatin (CRESTOR) 40 MG tablet   Other Relevant Orders   EKG 12-Lead   Dyslipidemia, goal LDL below 70 (Chronic)    LDL on most recent check was pretty well controlled, but not quite at goal.  Will convert from atorvastatin to rosuvastatin to see if that would reach his goal.  If not, will need to consider adding Zetia or potentially PCSK9 inhibitor.      Relevant Medications   rosuvastatin (CRESTOR) 40 MG tablet   Essential hypertension (Chronic)    Well-controlled on current meds.  No change      Relevant Medications   rosuvastatin (CRESTOR) 40 MG tablet   Obesity (BMI 30.0-34.9) (Chronic)    Unfortunately, now going backwards.  We discussed the importance of weight loss with diet exercise.      Paroxysmal Atrial fibrillation with RVR - peri-MI; recurrent after DCCV (Chronic)    It would appear that he has not had a breakthrough episode since his MI and cardioversion.  For now I think we  will continue on Eliquis with low-dose beta-blocker.  Stopping Plavix will help avoid diffuse bruising.      Relevant Medications   rosuvastatin (CRESTOR) 40 MG tablet   Other Relevant Orders   EKG 12-Lead   STEMI of inferior wall 11/18/12- RCA BMS (Chronic)    Pretty large inferior MI with initially reduced EF now back to baseline normal.  Large stent in  the RCA.  Thankfully, no recurrent anginal symptoms.  In fact his CHF seems relatively stable as well.  Is mostly now HFpEF.      Relevant Medications   rosuvastatin (CRESTOR) 40 MG tablet      Current medicines are reviewed at length with the patient today. (+/- concerns) n/a The following changes have been made: n/a  Patient Instructions  Medication Instructions:   STOP TAKING PLAVIX - CLOPIDOGREL STOP TAKING ATORVASTATIN 40 MG  START  ROSUVASTATIN 40 MG ONE TABLET DAILY If you need a refill on your cardiac medications before your next appointment, please call your pharmacy.   Lab work: NOT NEEDED If you have labs (blood work) drawn today and your tests are completely normal, you will receive your results only by: [name] MyChart Message (if you have MyChart) OR . A paper copy in the mail If you have any lab test that is abnormal or we need to change your treatment, we will call you to review the results.  Testing/Procedures: NOT NEEDED  Follow-Up: At [facility], you and your health needs are our priority.  As part of our continuing mission to provide you with exceptional heart care, we have created designated Provider Care Teams.  These Care Teams include your primary Cardiologist (physician) and Advanced Practice Providers (APPs -  Physician Assistants and Nurse Practitioners) who all work together to provide you with the care you need, when you need it. You will need a follow up appointment in 12 months.  Please call our office 2 months in advance to schedule this appointment.  You may see Glenetta Hew, MD or one of the following Advanced Practice Providers on your designated Care Team:   Rosaria Ferries, PA-C . Jory Sims, DNP, ANP  Any Other Special Instructions Will Be Listed Below (If Applicable).      Studies Ordered:   Orders Placed This Encounter  Procedures  . EKG 12-Lead      Glenetta Hew, M.D., M.S. Interventional Cardiologist   Pager #  [phone] Phone # [phone] [address] [place], [place] [address]

## 2018-01-13 ENCOUNTER — Encounter: Payer: Self-pay | Admitting: Cardiology

## 2018-01-13 NOTE — Assessment & Plan Note (Signed)
Well-controlled on current meds.  No change 

## 2018-01-13 NOTE — Assessment & Plan Note (Signed)
LDL on most recent check was pretty well controlled, but not quite at goal.  Will convert from atorvastatin to rosuvastatin to see if that would reach his goal.  If not, will need to consider adding Zetia or potentially PCSK9 inhibitor.

## 2018-01-13 NOTE — Assessment & Plan Note (Signed)
Pretty large inferior MI with initially reduced EF now back to baseline normal.  Large stent in the RCA.  Thankfully, no recurrent anginal symptoms.  In fact his CHF seems relatively stable as well.  Is mostly now HFpEF.

## 2018-01-13 NOTE — Assessment & Plan Note (Signed)
Unfortunately, now going backwards.  We discussed the importance of weight loss with diet exercise.

## 2018-01-13 NOTE — Assessment & Plan Note (Signed)
Stable.  On good regimen of ARB and beta-blocker as most tolerated.  Relatively euvolemic on standing dose of Lasix.  Plan was to take an additional dose as needed for weight gain, dyspnea, orthopnea.

## 2018-01-13 NOTE — Assessment & Plan Note (Signed)
Large caliber BMS stent to RCA. As previously discussed, we will simply discontinue Plavix now continue Eliquis alone. Continue beta-blocker and statin along with ARB.

## 2018-01-13 NOTE — Assessment & Plan Note (Signed)
It would appear that he has not had a breakthrough episode since his MI and cardioversion.  For now I think we will continue on Eliquis with low-dose beta-blocker.  Stopping Plavix will help avoid diffuse bruising.

## 2018-01-13 NOTE — Assessment & Plan Note (Signed)
EF improved now.  Basically resolved.  Is on beta-blocker and ARB at current doses.  Relatively euvolemic on exam with standing low-dose Lasix with occasional doses PRN.

## 2018-01-14 DIAGNOSIS — J449 Chronic obstructive pulmonary disease, unspecified: Secondary | ICD-10-CM | POA: Diagnosis not present

## 2018-02-14 DIAGNOSIS — J449 Chronic obstructive pulmonary disease, unspecified: Secondary | ICD-10-CM | POA: Diagnosis not present

## 2018-03-12 DIAGNOSIS — J449 Chronic obstructive pulmonary disease, unspecified: Secondary | ICD-10-CM | POA: Diagnosis not present

## 2018-03-12 DIAGNOSIS — J9611 Chronic respiratory failure with hypoxia: Secondary | ICD-10-CM | POA: Diagnosis not present

## 2018-03-12 DIAGNOSIS — I4891 Unspecified atrial fibrillation: Secondary | ICD-10-CM | POA: Diagnosis not present

## 2018-03-12 DIAGNOSIS — I2510 Atherosclerotic heart disease of native coronary artery without angina pectoris: Secondary | ICD-10-CM | POA: Diagnosis not present

## 2018-03-17 DIAGNOSIS — J449 Chronic obstructive pulmonary disease, unspecified: Secondary | ICD-10-CM | POA: Diagnosis not present

## 2018-04-15 DIAGNOSIS — J449 Chronic obstructive pulmonary disease, unspecified: Secondary | ICD-10-CM | POA: Diagnosis not present

## 2018-05-07 ENCOUNTER — Other Ambulatory Visit: Payer: Self-pay | Admitting: Cardiology

## 2018-05-07 NOTE — Telephone Encounter (Signed)
Eliquis refilled.  

## 2018-05-16 DIAGNOSIS — J449 Chronic obstructive pulmonary disease, unspecified: Secondary | ICD-10-CM | POA: Diagnosis not present

## 2018-05-26 DIAGNOSIS — Z0001 Encounter for general adult medical examination with abnormal findings: Secondary | ICD-10-CM | POA: Diagnosis not present

## 2018-05-26 DIAGNOSIS — I10 Essential (primary) hypertension: Secondary | ICD-10-CM | POA: Diagnosis not present

## 2018-05-26 DIAGNOSIS — Z1389 Encounter for screening for other disorder: Secondary | ICD-10-CM | POA: Diagnosis not present

## 2018-05-26 DIAGNOSIS — J449 Chronic obstructive pulmonary disease, unspecified: Secondary | ICD-10-CM | POA: Diagnosis not present

## 2018-05-26 DIAGNOSIS — I4891 Unspecified atrial fibrillation: Secondary | ICD-10-CM | POA: Diagnosis not present

## 2018-06-10 DIAGNOSIS — J301 Allergic rhinitis due to pollen: Secondary | ICD-10-CM | POA: Diagnosis not present

## 2018-06-10 DIAGNOSIS — J9611 Chronic respiratory failure with hypoxia: Secondary | ICD-10-CM | POA: Diagnosis not present

## 2018-06-10 DIAGNOSIS — J449 Chronic obstructive pulmonary disease, unspecified: Secondary | ICD-10-CM | POA: Diagnosis not present

## 2018-06-10 DIAGNOSIS — I4891 Unspecified atrial fibrillation: Secondary | ICD-10-CM | POA: Diagnosis not present

## 2018-06-15 DIAGNOSIS — J449 Chronic obstructive pulmonary disease, unspecified: Secondary | ICD-10-CM | POA: Diagnosis not present

## 2018-07-16 DIAGNOSIS — Z1389 Encounter for screening for other disorder: Secondary | ICD-10-CM | POA: Diagnosis not present

## 2018-07-16 DIAGNOSIS — D649 Anemia, unspecified: Secondary | ICD-10-CM | POA: Diagnosis not present

## 2018-07-16 DIAGNOSIS — Z0001 Encounter for general adult medical examination with abnormal findings: Secondary | ICD-10-CM | POA: Diagnosis not present

## 2018-07-16 DIAGNOSIS — J449 Chronic obstructive pulmonary disease, unspecified: Secondary | ICD-10-CM | POA: Diagnosis not present

## 2018-07-16 DIAGNOSIS — E7849 Other hyperlipidemia: Secondary | ICD-10-CM | POA: Diagnosis not present

## 2018-08-11 DIAGNOSIS — J449 Chronic obstructive pulmonary disease, unspecified: Secondary | ICD-10-CM | POA: Diagnosis not present

## 2018-08-11 DIAGNOSIS — J9611 Chronic respiratory failure with hypoxia: Secondary | ICD-10-CM | POA: Diagnosis not present

## 2018-08-11 DIAGNOSIS — I2510 Atherosclerotic heart disease of native coronary artery without angina pectoris: Secondary | ICD-10-CM | POA: Diagnosis not present

## 2018-08-11 DIAGNOSIS — I10 Essential (primary) hypertension: Secondary | ICD-10-CM | POA: Diagnosis not present

## 2018-08-14 ENCOUNTER — Other Ambulatory Visit: Payer: Self-pay | Admitting: Cardiology

## 2018-08-15 DIAGNOSIS — J449 Chronic obstructive pulmonary disease, unspecified: Secondary | ICD-10-CM | POA: Diagnosis not present

## 2018-09-15 DIAGNOSIS — J449 Chronic obstructive pulmonary disease, unspecified: Secondary | ICD-10-CM | POA: Diagnosis not present

## 2018-10-16 DIAGNOSIS — J449 Chronic obstructive pulmonary disease, unspecified: Secondary | ICD-10-CM | POA: Diagnosis not present

## 2018-10-29 DIAGNOSIS — I10 Essential (primary) hypertension: Secondary | ICD-10-CM | POA: Diagnosis not present

## 2018-10-29 DIAGNOSIS — E782 Mixed hyperlipidemia: Secondary | ICD-10-CM | POA: Diagnosis not present

## 2018-10-29 DIAGNOSIS — J449 Chronic obstructive pulmonary disease, unspecified: Secondary | ICD-10-CM | POA: Diagnosis not present

## 2018-10-29 DIAGNOSIS — I4891 Unspecified atrial fibrillation: Secondary | ICD-10-CM | POA: Diagnosis not present

## 2018-11-03 ENCOUNTER — Other Ambulatory Visit: Payer: Self-pay | Admitting: Cardiology

## 2018-11-10 ENCOUNTER — Other Ambulatory Visit (HOSPITAL_COMMUNITY): Payer: Self-pay | Admitting: Internal Medicine

## 2018-11-10 DIAGNOSIS — J449 Chronic obstructive pulmonary disease, unspecified: Secondary | ICD-10-CM | POA: Diagnosis not present

## 2018-11-10 DIAGNOSIS — R079 Chest pain, unspecified: Secondary | ICD-10-CM

## 2018-11-10 DIAGNOSIS — Z23 Encounter for immunization: Secondary | ICD-10-CM | POA: Diagnosis not present

## 2018-11-10 DIAGNOSIS — R0789 Other chest pain: Secondary | ICD-10-CM | POA: Diagnosis not present

## 2018-11-10 DIAGNOSIS — I5032 Chronic diastolic (congestive) heart failure: Secondary | ICD-10-CM | POA: Diagnosis not present

## 2018-11-10 DIAGNOSIS — I10 Essential (primary) hypertension: Secondary | ICD-10-CM | POA: Diagnosis not present

## 2018-11-11 ENCOUNTER — Ambulatory Visit (HOSPITAL_COMMUNITY)
Admission: RE | Admit: 2018-11-11 | Discharge: 2018-11-11 | Disposition: A | Discharge status: Home / Self Care | Payer: Medicare Other | Source: Ambulatory Visit | Attending: Internal Medicine | Admitting: Internal Medicine

## 2018-11-11 ENCOUNTER — Other Ambulatory Visit: Payer: Self-pay

## 2018-11-11 DIAGNOSIS — R079 Chest pain, unspecified: Secondary | ICD-10-CM | POA: Diagnosis not present

## 2018-11-15 DIAGNOSIS — J449 Chronic obstructive pulmonary disease, unspecified: Secondary | ICD-10-CM | POA: Diagnosis not present

## 2018-12-16 DIAGNOSIS — J449 Chronic obstructive pulmonary disease, unspecified: Secondary | ICD-10-CM | POA: Diagnosis not present

## 2018-12-24 DIAGNOSIS — K279 Peptic ulcer, site unspecified, unspecified as acute or chronic, without hemorrhage or perforation: Secondary | ICD-10-CM | POA: Diagnosis not present

## 2018-12-24 DIAGNOSIS — K219 Gastro-esophageal reflux disease without esophagitis: Secondary | ICD-10-CM | POA: Diagnosis not present

## 2018-12-24 DIAGNOSIS — I10 Essential (primary) hypertension: Secondary | ICD-10-CM | POA: Diagnosis not present

## 2018-12-24 DIAGNOSIS — R1013 Epigastric pain: Secondary | ICD-10-CM | POA: Diagnosis not present

## 2018-12-30 DIAGNOSIS — R109 Unspecified abdominal pain: Secondary | ICD-10-CM | POA: Diagnosis not present

## 2018-12-31 ENCOUNTER — Other Ambulatory Visit: Payer: Self-pay | Admitting: Cardiology

## 2019-01-12 DIAGNOSIS — I5032 Chronic diastolic (congestive) heart failure: Secondary | ICD-10-CM | POA: Diagnosis not present

## 2019-01-12 DIAGNOSIS — K859 Acute pancreatitis without necrosis or infection, unspecified: Secondary | ICD-10-CM | POA: Diagnosis not present

## 2019-01-12 DIAGNOSIS — K219 Gastro-esophageal reflux disease without esophagitis: Secondary | ICD-10-CM | POA: Diagnosis not present

## 2019-01-13 ENCOUNTER — Other Ambulatory Visit (HOSPITAL_COMMUNITY): Payer: Self-pay | Admitting: Internal Medicine

## 2019-01-13 ENCOUNTER — Ambulatory Visit (HOSPITAL_COMMUNITY)
Admission: RE | Admit: 2019-01-13 | Discharge: 2019-01-13 | Disposition: A | Discharge status: Home / Self Care | Payer: Medicare Other | Source: Ambulatory Visit | Attending: Internal Medicine | Admitting: Internal Medicine

## 2019-01-13 ENCOUNTER — Other Ambulatory Visit: Payer: Self-pay

## 2019-01-13 DIAGNOSIS — K859 Acute pancreatitis without necrosis or infection, unspecified: Secondary | ICD-10-CM | POA: Diagnosis not present

## 2019-01-13 DIAGNOSIS — R109 Unspecified abdominal pain: Secondary | ICD-10-CM | POA: Insufficient documentation

## 2019-01-13 MED ORDER — IOHEXOL 300 MG/ML  SOLN
100.0000 mL | Freq: Once | INTRAMUSCULAR | Status: AC | PRN
  Administered 2019-01-13: 13:00:00 100 mL via INTRAVENOUS

## 2019-01-13 MED ORDER — IOHEXOL 9 MG/ML PO SOLN: 500.0000 mL | ORAL | Status: AC

## 2019-01-14 ENCOUNTER — Other Ambulatory Visit: Payer: Self-pay | Admitting: Internal Medicine

## 2019-01-14 DIAGNOSIS — K8689 Other specified diseases of pancreas: Secondary | ICD-10-CM

## 2019-01-14 DIAGNOSIS — N2889 Other specified disorders of kidney and ureter: Secondary | ICD-10-CM

## 2019-01-15 DIAGNOSIS — J449 Chronic obstructive pulmonary disease, unspecified: Secondary | ICD-10-CM | POA: Diagnosis not present

## 2019-01-21 ENCOUNTER — Ambulatory Visit (HOSPITAL_COMMUNITY)
Admission: RE | Admit: 2019-01-21 | Discharge: 2019-01-21 | Disposition: A | Discharge status: Home / Self Care | Payer: Medicare Other | Source: Ambulatory Visit | Attending: Internal Medicine | Admitting: Internal Medicine

## 2019-01-21 ENCOUNTER — Other Ambulatory Visit: Payer: Self-pay | Admitting: Internal Medicine

## 2019-01-21 ENCOUNTER — Other Ambulatory Visit: Payer: Self-pay

## 2019-01-21 DIAGNOSIS — N2889 Other specified disorders of kidney and ureter: Secondary | ICD-10-CM | POA: Insufficient documentation

## 2019-01-21 DIAGNOSIS — K760 Fatty (change of) liver, not elsewhere classified: Secondary | ICD-10-CM | POA: Diagnosis not present

## 2019-01-21 DIAGNOSIS — K8689 Other specified diseases of pancreas: Secondary | ICD-10-CM | POA: Diagnosis not present

## 2019-01-21 DIAGNOSIS — K862 Cyst of pancreas: Secondary | ICD-10-CM | POA: Diagnosis not present

## 2019-01-21 DIAGNOSIS — N281 Cyst of kidney, acquired: Secondary | ICD-10-CM | POA: Diagnosis not present

## 2019-01-21 LAB — POCT I-STAT CREATININE: Creatinine, Ser: 1 mg/dL (ref 0.61–1.24)

## 2019-01-21 MED ORDER — GADOBUTROL 1 MMOL/ML IV SOLN
10.0000 mL | Freq: Once | INTRAVENOUS | Status: AC | PRN
  Administered 2019-01-21: 10 mL via INTRAVENOUS

## 2019-01-29 DIAGNOSIS — I2510 Atherosclerotic heart disease of native coronary artery without angina pectoris: Secondary | ICD-10-CM | POA: Diagnosis not present

## 2019-01-29 DIAGNOSIS — I5032 Chronic diastolic (congestive) heart failure: Secondary | ICD-10-CM | POA: Diagnosis not present

## 2019-01-29 DIAGNOSIS — I480 Paroxysmal atrial fibrillation: Secondary | ICD-10-CM | POA: Diagnosis not present

## 2019-01-29 DIAGNOSIS — I110 Hypertensive heart disease with heart failure: Secondary | ICD-10-CM | POA: Diagnosis not present

## 2019-02-02 ENCOUNTER — Other Ambulatory Visit: Payer: Self-pay | Admitting: Cardiology

## 2019-02-04 ENCOUNTER — Ambulatory Visit: Payer: Medicare Other | Admitting: Cardiology

## 2019-02-15 DIAGNOSIS — J449 Chronic obstructive pulmonary disease, unspecified: Secondary | ICD-10-CM | POA: Diagnosis not present

## 2019-02-16 ENCOUNTER — Encounter (INDEPENDENT_AMBULATORY_CARE_PROVIDER_SITE_OTHER): Payer: Self-pay

## 2019-02-20 ENCOUNTER — Ambulatory Visit: Payer: Medicare Other | Admitting: Cardiology

## 2019-02-20 ENCOUNTER — Encounter: Payer: Self-pay | Admitting: Cardiology

## 2019-02-20 ENCOUNTER — Other Ambulatory Visit: Payer: Self-pay

## 2019-02-20 VITALS — BP 124/78 | HR 70 | Ht 71.0 in | Wt 227.2 lb

## 2019-02-20 DIAGNOSIS — I2119 ST elevation (STEMI) myocardial infarction involving other coronary artery of inferior wall: Secondary | ICD-10-CM | POA: Diagnosis not present

## 2019-02-20 DIAGNOSIS — I255 Ischemic cardiomyopathy: Secondary | ICD-10-CM | POA: Diagnosis not present

## 2019-02-20 DIAGNOSIS — I2510 Atherosclerotic heart disease of native coronary artery without angina pectoris: Secondary | ICD-10-CM

## 2019-02-20 DIAGNOSIS — I5032 Chronic diastolic (congestive) heart failure: Secondary | ICD-10-CM | POA: Diagnosis not present

## 2019-02-20 DIAGNOSIS — R0609 Other forms of dyspnea: Secondary | ICD-10-CM

## 2019-02-20 DIAGNOSIS — E785 Hyperlipidemia, unspecified: Secondary | ICD-10-CM

## 2019-02-20 DIAGNOSIS — Z9861 Coronary angioplasty status: Secondary | ICD-10-CM | POA: Diagnosis not present

## 2019-02-20 DIAGNOSIS — I480 Paroxysmal atrial fibrillation: Secondary | ICD-10-CM

## 2019-02-20 DIAGNOSIS — Z01810 Encounter for preprocedural cardiovascular examination: Secondary | ICD-10-CM

## 2019-02-20 DIAGNOSIS — R0600 Dyspnea, unspecified: Secondary | ICD-10-CM

## 2019-02-20 NOTE — H&P (View-Only) (Signed)
Primary Care Provider: Redmond School, MD Cardiologist: Glenetta Hew, MD  Upcoming Consults:  Ferdie Ping, [place]  Urology: Dr. Diona Fanti  Clinic Note: Chief Complaint  Patient presents with  . Follow-up    Annual  . Coronary Artery Disease    No angina  . Cardiomyopathy    Other than dyspnea, no CHF symptoms.  . Pre-op Exam    Anticipates further procedures for pancreatic head mass    HPI:    Benjamin [name] is a 64 y.o. male with a complicated CAD history with ischemic cardiomyopathy, PAF and severe oxygen dependent COPD noted below who presents today for essentially annual follow-up..   CAD: Inferior STEMI October 2014 very large RCA occluded, cardiac shock and atrial fibrillation requiring cardioversion. ? PCL very large BMS stent 5 x 24 mm postdilated to 5.56mm ? Moderate Ischemic Cardiomyopathy with EF of 40-45% -->improved to 55-60% -> chronic HFpEF  PAF - no recurrence in > 18 months. - On Eliquis & Bisoprolol.  COPD - on home O2 4L.   Benjamin [name] was last seen on January 10, 2018 and was doing quite well with no major cardiac issues.  Was taking only once a day Lasix with only rarely taking additional dosing.  We chose to stop clopidogrel and continue Eliquis only.  Baseline exertional dyspnea with severe COPD.  Recent Hospitalizations: None  Reviewed  CV studies:    The following studies were reviewed today: (if available, images/films reviewed: From Epic Chart or Care Everywhere) . 01/03/2019-CT Abdomen Pelvis W/contrast:  o Suspect mass in the head of the pancreas measuring~3.5 x 3 cm.  Extensive pancreatic duct dilatation distal to the mass.  Cystic area noted in body of pancreas that appears to be connected to dilated pancreatic duct measuring 1.7 x 1.7 cm.  Pancreatic neoplasm suspected-advise MRI. o Solid-appearing mass rise in the medial upper pole of left kidney-2.2 x 1.8 cm.  Bilateral renal scarring with calcification in  the periphery of the lower pole the right kidney secondary to scarring.  Multiple prostatic calculi. o Sigmoid diverticula without diverticulitis.  Hepatic steatosis. o Extensive aortic and pelvic arterial calcification with foci of coronary calcification.  01/22/2019-MRI Abdomen Pelvis: Lobulated, nonenhancing cystic 2.4 cm pancreatic body lesion communicating directly with irregularly prominent dilated (7 mm) main pancreatic duct.  No biliary ductal dilation.  Hemorrhagic/proteinaceous left renal cyst.  Mild diffuse hepatic steatosis and aortic atherosclerosis.  Interval History:   Benjamin [name] returns here today for annual follow-up stating that his heart is doing okay.  He is not having any chest pain or pressure with the amount of walking he does.  He is not very active.  Really since Covid he has been pretty much housebound.  Does very little during the day just walking around the house.  Now that the weather is getting colder he does not like to go outside.  With what he does, he denies any angina or.  No heart failure symptoms or sensation of arrhythmias.  He has not felt any A. fib in quite some time.   Now that we have backed off on his "blood thinners "his blood bruising has improved.  CV Review of Symptoms (Summary): positive for - dyspnea on exertion, shortness of breath and Most of this is chronic predating his MI.  Related to COPD. negative for - chest pain, edema, irregular heartbeat, orthopnea, palpitations, paroxysmal nocturnal dyspnea, rapid heart rate or Syncope/near syncope, TIA/amaurosis fugax.  Does not walk enough to note claudication.[name]  The major issues that he brings up today are revolving around his PCP noting some lab abnormalities and then sending for CT of the abdomen followed by MRI of the abdomen.  They found what looks like a renal cyst but worsening that the pancreatic head mass.  He is now due to see both the urologist and gastroenterologist.  He is notably  dejected today worried about these upcoming visits.  The patient does not have symptoms concerning for COVID-19 infection (fever, chills, cough, or new shortness of breath).  His dyspnea and coughing is all chronic The patient is practicing social distancing and masking.  He basically stays in the house or around the yard.  Does not go out in public.  REVIEWED OF SYSTEMS   A comprehensive ROS was performed. Review of Systems  Constitutional: Positive for malaise/fatigue (Does not have much energy). Negative for weight loss.  HENT: Negative for congestion and nosebleeds.   Respiratory: Positive for cough, shortness of breath and wheezing.        Chronic persistent cough, but improved since smoking cessation.  Chronic baseline dyspnea and wheezing.  Gastrointestinal: Negative for blood in stool and melena. Abdominal pain: Off-and-on, but not significant.  Genitourinary: Negative for hematuria.  Musculoskeletal: Positive for back pain and joint pain.  Neurological: Positive for dizziness (Sometimes with standing up quickly) and weakness.  Psychiatric/Behavioral: Negative for depression (Seems dysthymic) and memory loss. The patient is not nervous/anxious and does not have insomnia.   All other systems reviewed and are negative.   I have reviewed and (if needed) personally updated the patient's problem list, medications, allergies, past medical and surgical history, social and family history.   PAST MEDICAL HISTORY   Past Medical History:  Diagnosis Date  . Atrial fibrillation with RVR - peri-MI; recurrent after DCCV 11/19/2012  . CAD S/P percutaneous coronary angioplasty  11/18/2012   - PCI mid RCA - 5.0 mm x 24 mm VeriFlex BMS (5.6 mm)  . Chronic combined systolic and diastolic heart failure, NYHA class 2 ([place]) 11/17/2012  . Colon polyp    Status post polypectomy x3.  [name] COPD (chronic obstructive pulmonary disease) ([place])   . Dyslipidemia- low HDL 11/19/2012  . Ischemic cardiomyopathy  11/19/2012   EF 40-45% with inferoseptal HK, Gr 2DD - high LVEDP/LAP. --> confirmed by f/u Echo 03/2013.  [name] STEMI of inferior wall 11/18/12- RCA BMS 11/17/2012   Cardiogenic shock - post MI with RV infarction [785.51]  . Tobacco abuse    40 Pk-yr (1- 1 1/2 PPD) --> Quit 11/18/2012 when presented with STEMI    PAST SURGICAL HISTORY   Past Surgical History:  Procedure Laterality Date  . COLONOSCOPY N/A 11/20/2013   Procedure: COLONOSCOPY;  Surgeon: Jamesetta So, MD;  Location: [facility];  Service: Gastroenterology;  Laterality: N/A;  . CORONARY ANGIOPLASTY WITH STENT PLACEMENT Right 11/17/12   Mid RCA aspiration thrombectomy and PCI with VeriFlex BMS 5.0 mm x 24 mm (5.6 mm; also PTCA of distal RPL 4 distal embolization.  . Dental extractions    . LEFT HEART CATHETERIZATION WITH CORONARY ANGIOGRAM N/A 11/17/2012   Procedure: LEFT HEART CATHETERIZATION WITH CORONARY ANGIOGRAM;  Surgeon: Leonie Man, MD;  [facility] CATH LAB: for Inferior STEMI -> EF 50-55%-mod basal -apical inferior HK. (LCA- Normal) LM -very large -->Tortuous, wraparound LAD -> Large bifurcating D1. Cx -large, no-dom - large LatOM1, small AVG &2 small OMs.  RCA (Very Large) - m90% subtototal -> RPAV(large PL2) & rPDA mild distal embolization  .  TRANSTHORACIC ECHOCARDIOGRAM  11/18/2012   Normal LV size, moderate reduced function 40-45% with severe HK-AK of the inferoseptal wall with incoordinate septal motion.  Domingo Dimes ECHOCARDIOGRAM  March 15   Moderately reduced LVEF of 40 at 45% with inferoseptal hypokinesis and high filling pressures.  . TRANSTHORACIC ECHOCARDIOGRAM  04/2015   EF 55-60%.; Pericardial fat pad versus effusion; no tamponade     MEDICATIONS/ALLERGIES   Current Meds  Medication Sig  . acetaminophen (TYLENOL) 500 MG tablet Take by mouth every 6 (six) hours as needed for pain.  [name] albuterol (PROVENTIL HFA;VENTOLIN HFA) 108 (90 BASE) MCG/ACT inhaler Inhale 2 puffs into the lungs every 6 (six) hours  as needed for wheezing.  . bisoprolol (ZEBETA) 5 MG tablet TAKE 1/2 TABLET BY MOUTH EVERY DAY  . Coenzyme Q10 (CO Q-10) 100 MG CAPS Take 300 mg by mouth daily.  [name] ELIQUIS 5 MG TABS tablet TAKE 1 TABLET BY MOUTH TWICE DAILY  . Fluticasone-Umeclidin-Vilant (TRELEGY ELLIPTA) 100-62.5-25 MCG/INH AEPB Inhale 1 puff into the lungs daily.  . furosemide (LASIX) 40 MG tablet TAKE ONE TO TWO TABLETS BY MOUTH EVERY MORNING AS NEEDED AND TAKE 1 TABLET BY MOUTH EVERY EVENING  . levalbuterol (XOPENEX) 0.63 MG/3ML nebulizer solution Take 3 mLs (0.63 mg total) by nebulization every 3 (three) hours as needed for wheezing or shortness of breath.  . levofloxacin (LEVAQUIN) 500 MG tablet Take 500 mg by mouth daily.  [name] losartan (COZAAR) 50 MG tablet TAKE 1 TABLET BY MOUTH EVERY DAY  . nitroGLYCERIN (NITROSTAT) 0.4 MG SL tablet Place 1 tablet (0.4 mg total) under the tongue every 5 (five) minutes x 3 doses as needed for chest pain.  Donell Sievert IN Inhale into the lungs. Using 4 liters nasal cannula ,continuous  . pantoprazole (PROTONIX) 40 MG tablet Take 40 mg by mouth daily.  . potassium chloride (K-DUR) 10 MEQ tablet TAKE 1 TABLET BY MOUTH EVERY DAY  . predniSONE (DELTASONE) 10 MG tablet Take 10 mg by mouth daily with breakfast.  . rosuvastatin (CRESTOR) 40 MG tablet TAKE 1 TABLET BY MOUTH EVERY DAY  . sodium chloride (OCEAN) 0.65 % SOLN nasal spray Place 1 spray into both nostrils as needed for congestion.    Allergies  Allergen Reactions  . Codeine     Red blotches  . Penicillins     Childhood reaction     SOCIAL HISTORY/FAMILY HISTORY   Social History   Tobacco Use  . Smoking status: Former Smoker    Packs/day: 1.00    Years: 40.00    Pack years: 40.00  . Smokeless tobacco: Never Used  . Tobacco comment: [place] 10/2102  Substance Use Topics  . Alcohol use: No  . Drug use: No   Social History   Social History Narrative   Divorced father of 2 (one daughter one  son).   Lives alone, but his daughter who lives nearby is "watching him like a Engineer, drilling ".   He quit smoking during his hospitalization after a 40-pack-year smoking history.   He is now gradually building up his exercise level walking 6 minutes at a time several times a day. He really a waiting Cardiac Rehabilitation.   Does not drink alcohol.    Family History family history includes Cancer in his sister; Heart attack in his father and mother.   OBJCTIVE -PE, EKG, labs   Wt Readings from Last 3 Encounters:  02/20/19 227 lb 3.2 oz (103.1 kg)  01/10/18 230 lb (104.3  kg)  12/31/16 225 lb 6.4 oz (102.2 kg)    Physical Exam: BP 124/78   Pulse 70   Ht 5\' 11"  (1.803 m)   Wt 227 lb 3.2 oz (103.1 kg)   SpO2 97%   BMI 31.69 kg/m  Physical Exam  Constitutional: He is oriented to person, place, and time. No distress.  Chronically ill, mildly obese gentleman with truncal obesity sitting somewhat tripod on the examining table with oxygen concentrator.  HENT:  Head: Normocephalic and atraumatic.  Eyes: No scleral icterus.  Neck: No hepatojugular reflux and no JVD present. Carotid bruit is not present.  Cardiovascular: Normal rate, regular rhythm, S1 normal and S2 normal.  Occasional extrasystoles are present. PMI is not displaced (Mildly sustained). Exam reveals distant heart sounds (Barely audible) and decreased pulses (Decreased, but palpable pedal pulses.). Exam reveals no gallop and no friction rub.  No murmur heard. Pulmonary/Chest: No respiratory distress. He has wheezes (Diffuse expiratory wheezing with expiratory rhonchi.). He has no rales. He exhibits no tenderness.  Baseline accessory muscle use, nonlabored.  Abdominal: Soft. Bowel sounds are normal. He exhibits no distension. There is no abdominal tenderness. There is no rebound.  Unable to assess HSM.  Musculoskeletal:        General: No edema. Normal range of motion.     Cervical back: Normal range of motion and neck supple.    Neurological: He is alert and oriented to person, place, and time.  Psychiatric: His behavior is normal. Judgment and thought content normal.  Somewhat down mood.  Seems a little bit upset  Vitals reviewed.   Adult ECG Report  Rate: 70;  Rhythm: normal sinus rhythm, premature ventricular contractions (PVC) and Incomplete right bundle branch block; inferior MI, age undetermined.;  Otherwise normal axis, intervals durations.  Narrative Interpretation: Stable EKG.  Recent Labs: July 16, 2018: TC 143, TG 176, HDL 53, LDL 55.  A1c 6.0.  Hgb 11.8, Cr 1.0.  K+ 3.8. Lab Results  Component Value Date   CHOL 154 06/27/2016   HDL 71 06/27/2016   LDLCALC 61 06/27/2016   TRIG 109 06/27/2016   CHOLHDL 2.2 06/27/2016   Lab Results  Component Value Date   CREATININE 1.00 01/21/2019   BUN 22 06/27/2016   NA 138 06/27/2016   K 4.0 06/27/2016   CL 96 (L) 06/27/2016   CO2 32 (H) 06/27/2016    ASSESSMENT/PLAN    Problem List Items Addressed This Visit    STEMI of inferior wall 11/18/12- RCA BMS (Chronic)    He is now over 6 years out from his large MI.  His EF essentially back to normal following PCI he is not really having significant heart failure symptoms.  No angina.  Is on stable regimen now.  Thankfully, his other vessels are relatively free of disease.      CAD S/P percutaneous coronary angioplasty - PCI mid RCA - 5.0 mm x 24 mm VeriFlex BMS (5.6 mm) - Primary (Chronic)    He had a very large caliber RCA with a bare-metal stent placed dilated up to 5.5 to 6 mm in diameter.  Some residual disease noted but has not had any anginal symptoms since then.  I really think his dyspnea is probably more related to COPD and not cardiac in nature.  At this point but was probably safe being off Plavix given the size of his bare-metal stent.  Continue on Eliquis alone.  He is on bisoprolol is a beta one selective beta-blocker along with  ARB at stable dose.  Also on stable dose of statin.         Relevant Orders   EKG 12-Lead   Cardiomyopathy, ischemic - EF 40-45%; now up to 55-60% (Chronic)    Notable improvement in EF following PCI, but still moderately reduced.  He does not really have a lot of edema and is on a pretty much standing dose of 40 mg once daily Lasix.  Has not had to take any additional doses.[name] He is on a stable dose of beta one selective beta-blocker and ARB. No PND or orthopnea and well-controlled swelling.      Relevant Orders   EKG 12-Lead   Chronic diastolic HF (heart failure), NYHA class 2 (HCC) (Chronic)    Hard to tell his NYHA status because of his COPD but at most likely NYHA Class II, no exacerbations.  EF pretty much back to normal following PCI but clearly has diastolic dysfunction.  I think most of his dyspnea symptoms are actually more related to COPD.  He is on stable medications with no exacerbations.      Relevant Orders   EKG 12-Lead   Dyslipidemia, goal LDL below 70 (Chronic)    Lipids are still well controlled as of last July.  He is on stable dose of rosuvastatin, tolerating it well.  We converted him from atorvastatin to rosuvastatin and have definitely seen a benefit.      Paroxysmal Atrial fibrillation with RVR - peri-MI; recurrent after DCCV (Chronic)    As far as I can tell, he has not had any breakthrough spells.  He was cardioverted following his MI and has not had another breakthrough.  He is on Eliquis now with low-dose beta-blocker for rate control.   Okay to hold Eliquis for procedures including biopsies and surgeries.      DOE (dyspnea on exertion) (Chronic)    Largely related to COPD and deconditioning.  I doubt diastolic dysfunction is contributing much.  Unfortunately COVID-19 has kept him very isolated and is contributing to more deconditioning.      Preop cardiovascular exam    Looking at his images, I suspect that there may be some consideration of potential ERCP or other diagnostic procedures.  Not sure if he would  be considered to be a surgical candidate or not. Except from a cardiac standpoint, he is relatively asymptomatic without any CHF or anginal symptoms.  With no history of stroke, diabetes or renal insufficiency, only the risk of surgery or procedure would play a role in cardiovascular stratification.  In fact, the most notable risk for him would likely be his pulmonary issues.  My assessment is that he does not require further cardiac evaluation prior to any procedures to delay procedures and not change current medical management.  His pulmonary issues other major concern..  It would be okay to hold Eliquis for 5 to 7 days prior to any procedures if necessary.          COVID-19 Education: The signs and symptoms of COVID-19 were discussed with the patient and how to seek care for testing (follow up with PCP or arrange E-visit).   The importance of social distancing was discussed today.  I also discussed the concern that he is at high risk for significant decompensation if infected with COVID-19.  Recommended that he go on to the health department or call health website to determine potential candidacy for COVID-19 vaccination.  I spent a total of 21 minutes with the patient. >  50% of the time was spent in direct patient consultation.  Additional time spent with chart review (studies, outside notes, etc): 8 Total Time: 29 min   Current medicines are reviewed at length with the patient today.  (+/- concerns) none   Patient Instructions / Medication Changes & Studies & Tests Ordered   Patient Instructions  Medication Instructions:  Your physician recommends that you continue on your current medications as directed. Please refer to the Current Medication list given to you today.  *If you need a refill on your cardiac medications before your next appointment, please call your pharmacy*  Follow-Up: At [facility], you and your health needs are our priority.  As part of our continuing  mission to provide you with exceptional heart care, we have created designated Provider Care Teams.  These Care Teams include your primary Cardiologist (physician) and Advanced Practice Providers (APPs -  Physician Assistants and Nurse Practitioners) who all work together to provide you with the care you need, when you need it.  Your next appointment:   12 month(s)  The format for your next appointment:   In Person  Provider:   Glenetta Hew, MD  Other Instructions  Per Dr. Ellyn Hack, it would be OK to hold Eliquis for 2-3 days for procedures     Studies Ordered:   Orders Placed This Encounter  Procedures  . EKG 12-Lead     Glenetta Hew, M.D., M.S. Interventional Cardiologist   Pager # [phone] Phone # [phone] [address] [place], [place] [address]   Thank you for choosing Heartcare at [facility]!!

## 2019-02-20 NOTE — Patient Instructions (Signed)
Medication Instructions:  Your physician recommends that you continue on your current medications as directed. Please refer to the Current Medication list given to you today.  *If you need a refill on your cardiac medications before your next appointment, please call your pharmacy*  Follow-Up: At [facility], you and your health needs are our priority.  As part of our continuing mission to provide you with exceptional heart care, we have created designated Provider Care Teams.  These Care Teams include your primary Cardiologist (physician) and Advanced Practice Providers (APPs -  Physician Assistants and Nurse Practitioners) who all work together to provide you with the care you need, when you need it.  Your next appointment:   12 month(s)  The format for your next appointment:   In Person  Provider:   Glenetta Hew, MD  Other Instructions  Per Dr. Ellyn Hack, it would be OK to hold Eliquis for 2-3 days for procedures

## 2019-02-20 NOTE — Progress Notes (Signed)
Primary Care Provider: Redmond School, MD Cardiologist: Glenetta Hew, MD  Upcoming Consults:  Ferdie Ping, [place]  Urology: Dr. Diona Fanti  Clinic Note: Chief Complaint  Patient presents with  . Follow-up    Annual  . Coronary Artery Disease    No angina  . Cardiomyopathy    Other than dyspnea, no CHF symptoms.  . Pre-op Exam    Anticipates further procedures for pancreatic head mass    HPI:    Benjamin [name] is a 64 y.o. male with a complicated CAD history with ischemic cardiomyopathy, PAF and severe oxygen dependent COPD noted below who presents today for essentially annual follow-up..   CAD: Inferior STEMI October 2014 very large RCA occluded, cardiac shock and atrial fibrillation requiring cardioversion. ? PCL very large BMS stent 5 x 24 mm postdilated to 5.84mm ? Moderate Ischemic Cardiomyopathy with EF of 40-45% -->improved to 55-60% -> chronic HFpEF  PAF - no recurrence in > 18 months. - On Eliquis & Bisoprolol.  COPD - on home O2 4L.   Benjamin [name] was last seen on January 10, 2018 and was doing quite well with no major cardiac issues.  Was taking only once a day Lasix with only rarely taking additional dosing.  We chose to stop clopidogrel and continue Eliquis only.  Baseline exertional dyspnea with severe COPD.  Recent Hospitalizations: None  Reviewed  CV studies:    The following studies were reviewed today: (if available, images/films reviewed: From Epic Chart or Care Everywhere) . 01/03/2019-CT Abdomen Pelvis W/contrast:  o Suspect mass in the head of the pancreas measuring~3.5 x 3 cm.  Extensive pancreatic duct dilatation distal to the mass.  Cystic area noted in body of pancreas that appears to be connected to dilated pancreatic duct measuring 1.7 x 1.7 cm.  Pancreatic neoplasm suspected-advise MRI. o Solid-appearing mass rise in the medial upper pole of left kidney-2.2 x 1.8 cm.  Bilateral renal scarring with calcification in  the periphery of the lower pole the right kidney secondary to scarring.  Multiple prostatic calculi. o Sigmoid diverticula without diverticulitis.  Hepatic steatosis. o Extensive aortic and pelvic arterial calcification with foci of coronary calcification.  01/22/2019-MRI Abdomen Pelvis: Lobulated, nonenhancing cystic 2.4 cm pancreatic body lesion communicating directly with irregularly prominent dilated (7 mm) main pancreatic duct.  No biliary ductal dilation.  Hemorrhagic/proteinaceous left renal cyst.  Mild diffuse hepatic steatosis and aortic atherosclerosis.  Interval History:   Benjamin [name] returns here today for annual follow-up stating that his heart is doing okay.  He is not having any chest pain or pressure with the amount of walking he does.  He is not very active.  Really since Covid he has been pretty much housebound.  Does very little during the day just walking around the house.  Now that the weather is getting colder he does not like to go outside.  With what he does, he denies any angina or.  No heart failure symptoms or sensation of arrhythmias.  He has not felt any A. fib in quite some time.   Now that we have backed off on his "blood thinners "his blood bruising has improved.  CV Review of Symptoms (Summary): positive for - dyspnea on exertion, shortness of breath and Most of this is chronic predating his MI.  Related to COPD. negative for - chest pain, edema, irregular heartbeat, orthopnea, palpitations, paroxysmal nocturnal dyspnea, rapid heart rate or Syncope/near syncope, TIA/amaurosis fugax.  Does not walk enough to note claudication.[name]  The major issues that he brings up today are revolving around his PCP noting some lab abnormalities and then sending for CT of the abdomen followed by MRI of the abdomen.  They found what looks like a renal cyst but worsening that the pancreatic head mass.  He is now due to see both the urologist and gastroenterologist.  He is notably  dejected today worried about these upcoming visits.  The patient does not have symptoms concerning for COVID-19 infection (fever, chills, cough, or new shortness of breath).  His dyspnea and coughing is all chronic The patient is practicing social distancing and masking.  He basically stays in the house or around the yard.  Does not go out in public.  REVIEWED OF SYSTEMS   A comprehensive ROS was performed. Review of Systems  Constitutional: Positive for malaise/fatigue (Does not have much energy). Negative for weight loss.  HENT: Negative for congestion and nosebleeds.   Respiratory: Positive for cough, shortness of breath and wheezing.        Chronic persistent cough, but improved since smoking cessation.  Chronic baseline dyspnea and wheezing.  Gastrointestinal: Negative for blood in stool and melena. Abdominal pain: Off-and-on, but not significant.  Genitourinary: Negative for hematuria.  Musculoskeletal: Positive for back pain and joint pain.  Neurological: Positive for dizziness (Sometimes with standing up quickly) and weakness.  Psychiatric/Behavioral: Negative for depression (Seems dysthymic) and memory loss. The patient is not nervous/anxious and does not have insomnia.   All other systems reviewed and are negative.   I have reviewed and (if needed) personally updated the patient's problem list, medications, allergies, past medical and surgical history, social and family history.   PAST MEDICAL HISTORY   Past Medical History:  Diagnosis Date  . Atrial fibrillation with RVR - peri-MI; recurrent after DCCV 11/19/2012  . CAD S/P percutaneous coronary angioplasty  11/18/2012   - PCI mid RCA - 5.0 mm x 24 mm VeriFlex BMS (5.6 mm)  . Chronic combined systolic and diastolic heart failure, NYHA class 2 ([place]) 11/17/2012  . Colon polyp    Status post polypectomy x3.  [name] COPD (chronic obstructive pulmonary disease) ([place])   . Dyslipidemia- low HDL 11/19/2012  . Ischemic cardiomyopathy  11/19/2012   EF 40-45% with inferoseptal HK, Gr 2DD - high LVEDP/LAP. --> confirmed by f/u Echo 03/2013.  [name] STEMI of inferior wall 11/18/12- RCA BMS 11/17/2012   Cardiogenic shock - post MI with RV infarction [785.51]  . Tobacco abuse    40 Pk-yr (1- 1 1/2 PPD) --> Quit 11/18/2012 when presented with STEMI    PAST SURGICAL HISTORY   Past Surgical History:  Procedure Laterality Date  . COLONOSCOPY N/A 11/20/2013   Procedure: COLONOSCOPY;  Surgeon: Jamesetta So, MD;  Location: [facility];  Service: Gastroenterology;  Laterality: N/A;  . CORONARY ANGIOPLASTY WITH STENT PLACEMENT Right 11/17/12   Mid RCA aspiration thrombectomy and PCI with VeriFlex BMS 5.0 mm x 24 mm (5.6 mm; also PTCA of distal RPL 4 distal embolization.  . Dental extractions    . LEFT HEART CATHETERIZATION WITH CORONARY ANGIOGRAM N/A 11/17/2012   Procedure: LEFT HEART CATHETERIZATION WITH CORONARY ANGIOGRAM;  Surgeon: Leonie Man, MD;  [facility] CATH LAB: for Inferior STEMI -> EF 50-55%-mod basal -apical inferior HK. (LCA- Normal) LM -very large -->Tortuous, wraparound LAD -> Large bifurcating D1. Cx -large, no-dom - large LatOM1, small AVG &2 small OMs.  RCA (Very Large) - m90% subtototal -> RPAV(large PL2) & rPDA mild distal embolization  .  TRANSTHORACIC ECHOCARDIOGRAM  11/18/2012   Normal LV size, moderate reduced function 40-45% with severe HK-AK of the inferoseptal wall with incoordinate septal motion.  Domingo Dimes ECHOCARDIOGRAM  March 15   Moderately reduced LVEF of 40 at 45% with inferoseptal hypokinesis and high filling pressures.  . TRANSTHORACIC ECHOCARDIOGRAM  04/2015   EF 55-60%.; Pericardial fat pad versus effusion; no tamponade     MEDICATIONS/ALLERGIES   Current Meds  Medication Sig  . acetaminophen (TYLENOL) 500 MG tablet Take by mouth every 6 (six) hours as needed for pain.  [name] albuterol (PROVENTIL HFA;VENTOLIN HFA) 108 (90 BASE) MCG/ACT inhaler Inhale 2 puffs into the lungs every 6 (six) hours  as needed for wheezing.  . bisoprolol (ZEBETA) 5 MG tablet TAKE 1/2 TABLET BY MOUTH EVERY DAY  . Coenzyme Q10 (CO Q-10) 100 MG CAPS Take 300 mg by mouth daily.  [name] ELIQUIS 5 MG TABS tablet TAKE 1 TABLET BY MOUTH TWICE DAILY  . Fluticasone-Umeclidin-Vilant (TRELEGY ELLIPTA) 100-62.5-25 MCG/INH AEPB Inhale 1 puff into the lungs daily.  . furosemide (LASIX) 40 MG tablet TAKE ONE TO TWO TABLETS BY MOUTH EVERY MORNING AS NEEDED AND TAKE 1 TABLET BY MOUTH EVERY EVENING  . levalbuterol (XOPENEX) 0.63 MG/3ML nebulizer solution Take 3 mLs (0.63 mg total) by nebulization every 3 (three) hours as needed for wheezing or shortness of breath.  . levofloxacin (LEVAQUIN) 500 MG tablet Take 500 mg by mouth daily.  [name] losartan (COZAAR) 50 MG tablet TAKE 1 TABLET BY MOUTH EVERY DAY  . nitroGLYCERIN (NITROSTAT) 0.4 MG SL tablet Place 1 tablet (0.4 mg total) under the tongue every 5 (five) minutes x 3 doses as needed for chest pain.  Donell Sievert IN Inhale into the lungs. Using 4 liters nasal cannula ,continuous  . pantoprazole (PROTONIX) 40 MG tablet Take 40 mg by mouth daily.  . potassium chloride (K-DUR) 10 MEQ tablet TAKE 1 TABLET BY MOUTH EVERY DAY  . predniSONE (DELTASONE) 10 MG tablet Take 10 mg by mouth daily with breakfast.  . rosuvastatin (CRESTOR) 40 MG tablet TAKE 1 TABLET BY MOUTH EVERY DAY  . sodium chloride (OCEAN) 0.65 % SOLN nasal spray Place 1 spray into both nostrils as needed for congestion.    Allergies  Allergen Reactions  . Codeine     Red blotches  . Penicillins     Childhood reaction     SOCIAL HISTORY/FAMILY HISTORY   Social History   Tobacco Use  . Smoking status: Former Smoker    Packs/day: 1.00    Years: 40.00    Pack years: 40.00  . Smokeless tobacco: Never Used  . Tobacco comment: [place] 10/2102  Substance Use Topics  . Alcohol use: No  . Drug use: No   Social History   Social History Narrative   Divorced father of 2 (one daughter one  son).   Lives alone, but his daughter who lives nearby is "watching him like a Engineer, drilling ".   He quit smoking during his hospitalization after a 40-pack-year smoking history.   He is now gradually building up his exercise level walking 6 minutes at a time several times a day. He really a waiting Cardiac Rehabilitation.   Does not drink alcohol.    Family History family history includes Cancer in his sister; Heart attack in his father and mother.   OBJCTIVE -PE, EKG, labs   Wt Readings from Last 3 Encounters:  02/20/19 227 lb 3.2 oz (103.1 kg)  01/10/18 230 lb (104.3  kg)  12/31/16 225 lb 6.4 oz (102.2 kg)    Physical Exam: BP 124/78   Pulse 70   Ht 5\' 11"  (1.803 m)   Wt 227 lb 3.2 oz (103.1 kg)   SpO2 97%   BMI 31.69 kg/m  Physical Exam  Constitutional: He is oriented to person, place, and time. No distress.  Chronically ill, mildly obese gentleman with truncal obesity sitting somewhat tripod on the examining table with oxygen concentrator.  HENT:  Head: Normocephalic and atraumatic.  Eyes: No scleral icterus.  Neck: No hepatojugular reflux and no JVD present. Carotid bruit is not present.  Cardiovascular: Normal rate, regular rhythm, S1 normal and S2 normal.  Occasional extrasystoles are present. PMI is not displaced (Mildly sustained). Exam reveals distant heart sounds (Barely audible) and decreased pulses (Decreased, but palpable pedal pulses.). Exam reveals no gallop and no friction rub.  No murmur heard. Pulmonary/Chest: No respiratory distress. He has wheezes (Diffuse expiratory wheezing with expiratory rhonchi.). He has no rales. He exhibits no tenderness.  Baseline accessory muscle use, nonlabored.  Abdominal: Soft. Bowel sounds are normal. He exhibits no distension. There is no abdominal tenderness. There is no rebound.  Unable to assess HSM.  Musculoskeletal:        General: No edema. Normal range of motion.     Cervical back: Normal range of motion and neck supple.    Neurological: He is alert and oriented to person, place, and time.  Psychiatric: His behavior is normal. Judgment and thought content normal.  Somewhat down mood.  Seems a little bit upset  Vitals reviewed.   Adult ECG Report  Rate: 70;  Rhythm: normal sinus rhythm, premature ventricular contractions (PVC) and Incomplete right bundle branch block; inferior MI, age undetermined.;  Otherwise normal axis, intervals durations.  Narrative Interpretation: Stable EKG.  Recent Labs: July 16, 2018: TC 143, TG 176, HDL 53, LDL 55.  A1c 6.0.  Hgb 11.8, Cr 1.0.  K+ 3.8. Lab Results  Component Value Date   CHOL 154 06/27/2016   HDL 71 06/27/2016   LDLCALC 61 06/27/2016   TRIG 109 06/27/2016   CHOLHDL 2.2 06/27/2016   Lab Results  Component Value Date   CREATININE 1.00 01/21/2019   BUN 22 06/27/2016   NA 138 06/27/2016   K 4.0 06/27/2016   CL 96 (L) 06/27/2016   CO2 32 (H) 06/27/2016    ASSESSMENT/PLAN    Problem List Items Addressed This Visit    STEMI of inferior wall 11/18/12- RCA BMS (Chronic)    He is now over 6 years out from his large MI.  His EF essentially back to normal following PCI he is not really having significant heart failure symptoms.  No angina.  Is on stable regimen now.  Thankfully, his other vessels are relatively free of disease.      CAD S/P percutaneous coronary angioplasty - PCI mid RCA - 5.0 mm x 24 mm VeriFlex BMS (5.6 mm) - Primary (Chronic)    He had a very large caliber RCA with a bare-metal stent placed dilated up to 5.5 to 6 mm in diameter.  Some residual disease noted but has not had any anginal symptoms since then.  I really think his dyspnea is probably more related to COPD and not cardiac in nature.  At this point but was probably safe being off Plavix given the size of his bare-metal stent.  Continue on Eliquis alone.  He is on bisoprolol is a beta one selective beta-blocker along with  ARB at stable dose.  Also on stable dose of statin.         Relevant Orders   EKG 12-Lead   Cardiomyopathy, ischemic - EF 40-45%; now up to 55-60% (Chronic)    Notable improvement in EF following PCI, but still moderately reduced.  He does not really have a lot of edema and is on a pretty much standing dose of 40 mg once daily Lasix.  Has not had to take any additional doses.[name] He is on a stable dose of beta one selective beta-blocker and ARB. No PND or orthopnea and well-controlled swelling.      Relevant Orders   EKG 12-Lead   Chronic diastolic HF (heart failure), NYHA class 2 (HCC) (Chronic)    Hard to tell his NYHA status because of his COPD but at most likely NYHA Class II, no exacerbations.  EF pretty much back to normal following PCI but clearly has diastolic dysfunction.  I think most of his dyspnea symptoms are actually more related to COPD.  He is on stable medications with no exacerbations.      Relevant Orders   EKG 12-Lead   Dyslipidemia, goal LDL below 70 (Chronic)    Lipids are still well controlled as of last July.  He is on stable dose of rosuvastatin, tolerating it well.  We converted him from atorvastatin to rosuvastatin and have definitely seen a benefit.      Paroxysmal Atrial fibrillation with RVR - peri-MI; recurrent after DCCV (Chronic)    As far as I can tell, he has not had any breakthrough spells.  He was cardioverted following his MI and has not had another breakthrough.  He is on Eliquis now with low-dose beta-blocker for rate control.   Okay to hold Eliquis for procedures including biopsies and surgeries.      DOE (dyspnea on exertion) (Chronic)    Largely related to COPD and deconditioning.  I doubt diastolic dysfunction is contributing much.  Unfortunately COVID-19 has kept him very isolated and is contributing to more deconditioning.      Preop cardiovascular exam    Looking at his images, I suspect that there may be some consideration of potential ERCP or other diagnostic procedures.  Not sure if he would  be considered to be a surgical candidate or not. Except from a cardiac standpoint, he is relatively asymptomatic without any CHF or anginal symptoms.  With no history of stroke, diabetes or renal insufficiency, only the risk of surgery or procedure would play a role in cardiovascular stratification.  In fact, the most notable risk for him would likely be his pulmonary issues.  My assessment is that he does not require further cardiac evaluation prior to any procedures to delay procedures and not change current medical management.  His pulmonary issues other major concern..  It would be okay to hold Eliquis for 5 to 7 days prior to any procedures if necessary.          COVID-19 Education: The signs and symptoms of COVID-19 were discussed with the patient and how to seek care for testing (follow up with PCP or arrange E-visit).   The importance of social distancing was discussed today.  I also discussed the concern that he is at high risk for significant decompensation if infected with COVID-19.  Recommended that he go on to the health department or call health website to determine potential candidacy for COVID-19 vaccination.  I spent a total of 21 minutes with the patient. >  50% of the time was spent in direct patient consultation.  Additional time spent with chart review (studies, outside notes, etc): 8 Total Time: 29 min   Current medicines are reviewed at length with the patient today.  (+/- concerns) none   Patient Instructions / Medication Changes & Studies & Tests Ordered   Patient Instructions  Medication Instructions:  Your physician recommends that you continue on your current medications as directed. Please refer to the Current Medication list given to you today.  *If you need a refill on your cardiac medications before your next appointment, please call your pharmacy*  Follow-Up: At [facility], you and your health needs are our priority.  As part of our continuing  mission to provide you with exceptional heart care, we have created designated Provider Care Teams.  These Care Teams include your primary Cardiologist (physician) and Advanced Practice Providers (APPs -  Physician Assistants and Nurse Practitioners) who all work together to provide you with the care you need, when you need it.  Your next appointment:   12 month(s)  The format for your next appointment:   In Person  Provider:   Glenetta Hew, MD  Other Instructions  Per Dr. Ellyn Hack, it would be OK to hold Eliquis for 2-3 days for procedures     Studies Ordered:   Orders Placed This Encounter  Procedures  . EKG 12-Lead     Glenetta Hew, M.D., M.S. Interventional Cardiologist   Pager # [phone] Phone # [phone] [address] [place], [place] [address]   Thank you for choosing [facility] at [facility]!!

## 2019-02-21 ENCOUNTER — Encounter: Payer: Self-pay | Admitting: Cardiology

## 2019-02-21 DIAGNOSIS — Z01810 Encounter for preprocedural cardiovascular examination: Secondary | ICD-10-CM | POA: Insufficient documentation

## 2019-02-21 NOTE — Assessment & Plan Note (Addendum)
Notable improvement in EF following PCI, but still moderately reduced.  He does not really have a lot of edema and is on a pretty much standing dose of 40 mg once daily Lasix.  Has not had to take any additional doses.[name] He is on a stable dose of beta one selective beta-blocker and ARB. No PND or orthopnea and well-controlled swelling.

## 2019-02-21 NOTE — Assessment & Plan Note (Signed)
Looking at his images, I suspect that there may be some consideration of potential ERCP or other diagnostic procedures.  Not sure if he would be considered to be a surgical candidate or not. Except from a cardiac standpoint, he is relatively asymptomatic without any CHF or anginal symptoms.  With no history of stroke, diabetes or renal insufficiency, only the risk of surgery or procedure would play a role in cardiovascular stratification.  In fact, the most notable risk for him would likely be his pulmonary issues.  My assessment is that he does not require further cardiac evaluation prior to any procedures to delay procedures and not change current medical management.  His pulmonary issues other major concern..  It would be okay to hold Eliquis for 5 to 7 days prior to any procedures if necessary.

## 2019-02-21 NOTE — Assessment & Plan Note (Signed)
Largely related to COPD and deconditioning.  I doubt diastolic dysfunction is contributing much.  Unfortunately COVID-19 has kept him very isolated and is contributing to more deconditioning.

## 2019-02-21 NOTE — Assessment & Plan Note (Signed)
Lipids are still well controlled as of last July.  He is on stable dose of rosuvastatin, tolerating it well.  We converted him from atorvastatin to rosuvastatin and have definitely seen a benefit.

## 2019-02-21 NOTE — Assessment & Plan Note (Signed)
He is now over 6 years out from his large MI.  His EF essentially back to normal following PCI he is not really having significant heart failure symptoms.  No angina.  Is on stable regimen now.  Thankfully, his other vessels are relatively free of disease.

## 2019-02-21 NOTE — Assessment & Plan Note (Signed)
He had a very large caliber RCA with a bare-metal stent placed dilated up to 5.5 to 6 mm in diameter.  Some residual disease noted but has not had any anginal symptoms since then.  I really think his dyspnea is probably more related to COPD and not cardiac in nature.  At this point but was probably safe being off Plavix given the size of his bare-metal stent.  Continue on Eliquis alone.  He is on bisoprolol is a beta one selective beta-blocker along with ARB at stable dose.  Also on stable dose of statin.

## 2019-02-21 NOTE — Assessment & Plan Note (Addendum)
Hard to tell his NYHA status because of his COPD but at most likely NYHA Class II, no exacerbations.  EF pretty much back to normal following PCI but clearly has diastolic dysfunction.  I think most of his dyspnea symptoms are actually more related to COPD.  He is on stable medications with no exacerbations.

## 2019-02-21 NOTE — Assessment & Plan Note (Signed)
As far as I can tell, he has not had any breakthrough spells.  He was cardioverted following his MI and has not had another breakthrough.  He is on Eliquis now with low-dose beta-blocker for rate control.   Okay to hold Eliquis for procedures including biopsies and surgeries.

## 2019-02-26 ENCOUNTER — Encounter: Payer: Medicare Other | Admitting: Cardiovascular Disease

## 2019-02-26 ENCOUNTER — Ambulatory Visit (INDEPENDENT_AMBULATORY_CARE_PROVIDER_SITE_OTHER): Payer: Medicare Other | Admitting: Gastroenterology

## 2019-02-26 ENCOUNTER — Encounter (INDEPENDENT_AMBULATORY_CARE_PROVIDER_SITE_OTHER): Payer: Self-pay | Admitting: Gastroenterology

## 2019-02-26 ENCOUNTER — Other Ambulatory Visit: Payer: Self-pay

## 2019-02-26 ENCOUNTER — Encounter: Payer: Self-pay | Admitting: Cardiovascular Disease

## 2019-02-26 VITALS — BP 143/74 | HR 60 | Temp 99.2°F | Ht 71.0 in | Wt 225.0 lb

## 2019-02-26 VITALS — BP 117/57 | HR 34 | Temp 97.2°F | Ht 71.0 in | Wt 225.4 lb

## 2019-02-26 DIAGNOSIS — R933 Abnormal findings on diagnostic imaging of other parts of digestive tract: Secondary | ICD-10-CM | POA: Diagnosis not present

## 2019-02-26 DIAGNOSIS — R748 Abnormal levels of other serum enzymes: Secondary | ICD-10-CM

## 2019-02-26 NOTE — Patient Instructions (Addendum)
Dr Laural Golden discussed case with Dr. Bronson Ing with cardiology - we recommend EGD and evaluation in their office    We will follow up on labs and endoscopic ultrasound

## 2019-02-26 NOTE — Progress Notes (Signed)
Patient profile: Benjamin [name] is a 64 y.o. male seen for evaluation of "pancreatitis". Referred by PCP Dr. Gerarda Fraction.   History of Present Illness: Benjamin [name] is seen today for evaluation of pancreatitis. Daughter accompanies and helps w/ hx.   He reports in November 2020 developing an episode of abdominal pain and vomiting.  He feels like this was short-lived but ultimately led to labs showing an increase amylase.  Ultimately had CT with MRCP as summarized extensively below. Also appears he was on treatment for H. pylori with amoxicillin, Flagyl and a PPI in November 2020.  He reports symptom wise having intermittent episodes of right upper quadrant pain over the past year.  When he does get these episodes approximately once a month they will be fairly severe, can last a few hours, do not seem to relate to eating.  Does feel a bowel movement makes them better.  Denies nausea vomiting with the episodes.  Otherwise he is feeling fairly well.  He has not vomited since November 2020 when treated for Hpylori.  He feels his appetite is good.  He is on Protonix 40 mg once a day and gets occasional GERD after spicy food but otherwise well controlled.  He denies any dysphagia, poor appetite.  No unintentional weight loss.  He has a bowel movement daily, usually 1-2 times a day and stool consistency is soft - loose.  Denies any bright red blood or melena.  No lower abdominal pain.  He denies any family history of pancreatitis colon polyps or colon cancer.  He is a non-smoker.  He has very occasional social alcohol intake remotely, none at all in past several years.  No prior history of heavy alcohol use.  He has been on 4 L of oxygen long-term since 2014, prior to 2014 was just on nighttime O2.  He has been on Eliquis since his stent was placed in 2014.  He stopped smoking in 2014 as well    Wt Readings from Last 3 Encounters:  02/26/19 225 lb (102.1 kg)  02/26/19 225 lb 6.4 oz (102.2 kg)    02/20/19 227 lb 3.2 oz (103.1 kg)     Last Colonoscopy: 2015-IMPRESSION 1.  Semi-pedunculated polyp ranging between 3-17mm in size was found in the ascending colon; polypectomy was performed using snare cautery 2.  Three semi-pedunculated polyps ranging between 3-19mm in size were found in the transverse colon; polypectomies were performed using snare cautery. Path w/ all TAs    Last Endoscopy: none prior    Past Medical History:  Past Medical History:  Diagnosis Date  . Atrial fibrillation with RVR - peri-MI; recurrent after DCCV 11/19/2012  . CAD S/P percutaneous coronary angioplasty  11/18/2012   - PCI mid RCA - 5.0 mm x 24 mm VeriFlex BMS (5.6 mm)  . Chronic combined systolic and diastolic heart failure, NYHA class 2 ([place]) 11/17/2012  . Colon polyp    Status post polypectomy x3.  [name] COPD (chronic obstructive pulmonary disease) ([place])   . Dyslipidemia- low HDL 11/19/2012  . Ischemic cardiomyopathy 11/19/2012   EF 40-45% with inferoseptal HK, Gr 2DD - high LVEDP/LAP. --> confirmed by f/u Echo 03/2013.  [name] STEMI of inferior wall 11/18/12- RCA BMS 11/17/2012   Cardiogenic shock - post MI with RV infarction [785.51]  . Tobacco abuse    40 Pk-yr (1- 1 1/2 PPD) --> Quit 11/18/2012 when presented with STEMI    Problem List: Patient Active Problem List   Diagnosis Date Noted  .  Preop cardiovascular exam 02/21/2019  . HCAP (healthcare-associated pneumonia) 05/26/2015  . Acute encephalopathy   . AKI (acute kidney injury) ([place])   . COPD exacerbation ([place]) 01/31/2015  . Obesity (BMI 30.0-34.9) 03/31/2014  . Essential hypertension 11/26/2013  . DOE (dyspnea on exertion) 07/18/2013  . Cardiomyopathy, ischemic - EF 40-45%; now up to 55-60% 12/07/2012  . Former heavy cigarette smoker (20-39 per day) 11/19/2012  . Dyslipidemia, goal LDL below 70 11/19/2012  . Paroxysmal Atrial fibrillation with RVR - peri-MI; recurrent after DCCV 11/19/2012  . Pneumonia- Rt 11/18/2012  . CAD S/P  percutaneous coronary angioplasty - PCI mid RCA - 5.0 mm x 24 mm VeriFlex BMS (5.6 mm) 11/18/2012    Class: Hospitalized for  . STEMI of inferior wall 11/18/12- RCA BMS 11/17/2012  . COPD (chronic obstructive pulmonary disease) - Now on Home O2 11/17/2012  . Chronic diastolic HF (heart failure), NYHA class 2 ([place]) 11/17/2012    Past Surgical History: Past Surgical History:  Procedure Laterality Date  . COLONOSCOPY N/A 11/20/2013   Procedure: COLONOSCOPY;  Surgeon: Jamesetta So, MD;  Location: [facility];  Service: Gastroenterology;  Laterality: N/A;  . CORONARY ANGIOPLASTY WITH STENT PLACEMENT Right 11/17/12   Mid RCA aspiration thrombectomy and PCI with VeriFlex BMS 5.0 mm x 24 mm (5.6 mm; also PTCA of distal RPL 4 distal embolization.  . Dental extractions    . LEFT HEART CATHETERIZATION WITH CORONARY ANGIOGRAM N/A 11/17/2012   Procedure: LEFT HEART CATHETERIZATION WITH CORONARY ANGIOGRAM;  Surgeon: Leonie Man, MD;  [facility] CATH LAB: for Inferior STEMI -> EF 50-55%-mod basal -apical inferior HK. (LCA- Normal) LM -very large -->Tortuous, wraparound LAD -> Large bifurcating D1. Cx -large, no-dom - large LatOM1, small AVG &2 small OMs.  RCA (Very Large) - m90% subtototal -> RPAV(large PL2) & rPDA mild distal embolization  . TRANSTHORACIC ECHOCARDIOGRAM  11/18/2012   Normal LV size, moderate reduced function 40-45% with severe HK-AK of the inferoseptal wall with incoordinate septal motion.  Domingo Dimes ECHOCARDIOGRAM  March 15   Moderately reduced LVEF of 40 at 45% with inferoseptal hypokinesis and high filling pressures.  . TRANSTHORACIC ECHOCARDIOGRAM  04/2015   EF 55-60%.; Pericardial fat pad versus effusion; no tamponade    Allergies: Allergies  Allergen Reactions  . Codeine     Red blotches  . Penicillins     Childhood reaction      Home Medications:  Current Outpatient Medications:  .  acetaminophen (TYLENOL) 500 MG tablet, Take by mouth every 6 (six) hours as needed  for pain., Disp: , Rfl:  .  albuterol (PROVENTIL HFA;VENTOLIN HFA) 108 (90 BASE) MCG/ACT inhaler, Inhale 2 puffs into the lungs every 6 (six) hours as needed for wheezing., Disp: , Rfl:  .  bisoprolol (ZEBETA) 5 MG tablet, TAKE 1/2 TABLET BY MOUTH EVERY DAY, Disp: 15 tablet, Rfl: 6 .  Coenzyme Q10 (CO Q-10) 100 MG CAPS, Take 300 mg by mouth daily., Disp: , Rfl:  .  ELIQUIS 5 MG TABS tablet, TAKE 1 TABLET BY MOUTH TWICE DAILY, Disp: 180 tablet, Rfl: 0 .  Fluticasone-Umeclidin-Vilant (TRELEGY ELLIPTA) 100-62.5-25 MCG/INH AEPB, Inhale 1 puff into the lungs daily., Disp: , Rfl:  .  furosemide (LASIX) 40 MG tablet, TAKE ONE TO TWO TABLETS BY MOUTH EVERY MORNING AS NEEDED AND TAKE 1 TABLET BY MOUTH EVERY EVENING, Disp: 90 tablet, Rfl: 11 .  losartan (COZAAR) 50 MG tablet, TAKE 1 TABLET BY MOUTH EVERY DAY, Disp: 30 tablet, Rfl: 6 .  nitroGLYCERIN (NITROSTAT) 0.4 MG SL tablet, Place 1 tablet (0.4 mg total) under the tongue every 5 (five) minutes x 3 doses as needed for chest pain., Disp: 25 tablet, Rfl: 2 .  OXYGEN-HELIUM IN, Inhale into the lungs. Using 4 liters nasal cannula ,continuous, Disp: , Rfl:  .  pantoprazole (PROTONIX) 40 MG tablet, Take 40 mg by mouth daily., Disp: , Rfl:  .  potassium chloride (K-DUR) 10 MEQ tablet, TAKE 1 TABLET BY MOUTH EVERY DAY, Disp: 30 tablet, Rfl: 6 .  rosuvastatin (CRESTOR) 40 MG tablet, TAKE 1 TABLET BY MOUTH EVERY DAY, Disp: 30 tablet, Rfl: 0 .  sodium chloride (OCEAN) 0.65 % SOLN nasal spray, Place 1 spray into both nostrils as needed for congestion., Disp: , Rfl:  .  levalbuterol (XOPENEX) 0.63 MG/3ML nebulizer solution, Take 3 mLs (0.63 mg total) by nebulization every 3 (three) hours as needed for wheezing or shortness of breath. (Patient not taking: Reported on 02/26/2019), Disp: , Rfl:  .  levofloxacin (LEVAQUIN) 500 MG tablet, Take 500 mg by mouth daily., Disp: , Rfl:  .  predniSONE (DELTASONE) 10 MG tablet, Take 10 mg by mouth daily with breakfast., Disp: , Rfl:      Family History: family history includes Cancer in his sister; Heart attack in his father and mother.    Social History:   reports that he has quit smoking. He has a 40.00 pack-year smoking history. He has never used smokeless tobacco. He reports that he does not drink alcohol or use drugs.   Review of Systems: Constitutional: Denies weight loss/weight gain  Eyes: No changes in vision. ENT: No oral lesions, sore throat.  GI: see HPI.  Heme/Lymph: No easy bruising.  CV: No chest pain.  GU: No hematuria.  Integumentary: No rashes.  Neuro: No headaches.  Psych: No depression/anxiety.  Endocrine: No heat/cold intolerance.  Allergic/Immunologic: No urticaria.  Resp: No cough, SOB.  Musculoskeletal: No joint swelling.    Physical Examination: BP (!) 117/57 (BP Location: Right Arm, Patient Position: Sitting, Cuff Size: Large)   Pulse (!) 34   Temp (!) 97.2 F (36.2 C) (Temporal)   Ht 5\' 11"  (1.803 m)   Wt 225 lb 6.4 oz (102.2 kg)   BMI 31.44 kg/m  Gen: NAD, alert and oriented x 4 HEENT: PEERLA, EOMI, Neck: supple, no JVD Chest: CTA bilaterally, no wheezes, crackles, or other adventitious sounds.  On 2 L oxygen in clinic CV: bradycardia, no m/g/c/r Abd: soft, NT, ND, +BS in all four quadrants; no HSM, guarding, ridigity, or rebound tenderness Ext: no edema, well perfused with 2+ pulses, Skin: no rash or lesions noted on observed skin Lymph: no noted LAD  Data:  Amylase December 2020-2500 (normal range 31-110)  CT scan January 13, 2019-IMPRESSION: 1. Suspect mass in head of pancreas measuring approximately 3.5 x 3.0 cm. Extensive pancreatic duct dilatation distal to this mass- like area. Cystic area noted in body of pancreas which appears to connect to the dilated pancreatic duct measuring 1.7 x 1.7 cm. Pancreatic neoplasm is suspected. Advise MR of the pancreas pre and post-contrast to further evaluate.  2. Solid-appearing mass arising from the medial upper pole  left kidney measuring 2.2 x 1.8 cm. Further evaluation of this mass is warranted. Further evaluation with pre and post contrast MRI or CT should be considered. MR examination of this left renal lesion could be performed at the same time as pancreatic MR.  3. Scarring in each kidney. Calcification along the periphery of the lower  pole of the right kidney is likely secondary to scarring. There are multiple prostatic calculi. Urinary bladder wall thickness is normal.  4. Sigmoid diverticula without diverticulitis. No bowel obstruction. No abscess in the abdomen or pelvis. Appendix appears normal.  5. Extensive aortic and pelvic arterial calcification. There also foci of coronary artery calcification.  6.  Hepatic steatosis  IMPRESSION: MRCP 12/2018 1. Lobulated nonenhancing cystic 2.4 cm pancreatic body lesion communicating directly with the irregularly prominently dilated main pancreatic duct. GI consultation for EUS/FNA recommended given main pancreatic duct dilation greater than 7 mm. No biliary ductal dilatation. 2. No suspicious renal masses. Bosniak category 2 hemorrhagic/proteinaceous left renal cyst. 3. Mild diffuse hepatic steatosis. 4.  Aortic Atherosclerosis (ICD10-I70.0  Assessment/Plan: Mr. Beecham is a 64 y.o. male  Bobbi was seen today for new patient (initial visit).  Diagnoses and all orders for this visit:  Abnormal CT scan, gastrointestinal tract -     Cancer antigen 19-9 -     Hepatic function panel -     CBC with Differential -     Amylase -     Lipase -     IgG 4  Abnormal magnetic resonance cholangiopancreatography (MRCP) -     Cancer antigen 19-9 -     Hepatic function panel -     CBC with Differential -     Amylase -     Lipase -     IgG 4  Elevated amylase -     Cancer antigen 19-9 -     Hepatic function panel -     CBC with Differential -     Amylase -     Lipase -     IgG 4     1.  Abnormal CT and abnormal MRCP as above-images  reviewed by Dr Laural Golden.  Needs EUS with FNA within the next 1 to 2 weeks for further clarification.  Will need propofol for procedure given his chronic co-morbidities, overall higher risk for sedation (on eliquis, chronic use 4L O2, etc).  He had an amylase elevation to 2500 about 2 months ago and I am unable to find any more recent labs in epic.  Will repeat labs CA 19-9, LFTs, amylase lipase, CBC, IgG4 today. Differential concerning for IPMM  2.  Bradycardia-heart rate noted in clinic to be 34.  Manually repeated by myself and Dr. Laural Golden.  Dr. Laural Golden discussed with cardiology Dr. Bronson Ing who has agreed to see patient in office today for EKG. Patient denies symptomatic dizziness, lightheadedness, etc. Addendum-EKG showing ventricular bigenemy.   Will send referral information to Dr. Ardis Hughs for EUS. Dr Ardis Hughs to determine Eliquis hold time.   60 min total pt care. >50% face to face  I personally performed the service, non-incident to. (WP)  Laurine Blazer, [facility]

## 2019-02-27 ENCOUNTER — Telehealth: Payer: Self-pay

## 2019-02-27 ENCOUNTER — Other Ambulatory Visit: Payer: Self-pay

## 2019-02-27 DIAGNOSIS — K862 Cyst of pancreas: Secondary | ICD-10-CM

## 2019-02-27 DIAGNOSIS — K838 Other specified diseases of biliary tract: Secondary | ICD-10-CM

## 2019-02-27 NOTE — Telephone Encounter (Signed)
The pt has been advised that he can hold eliquis 3 days prior to the procedure.

## 2019-02-27 NOTE — Progress Notes (Signed)
This encounter was created in error - please disregard.

## 2019-02-27 NOTE — H&P (View-Only) (Signed)
This encounter was created in error - please disregard.

## 2019-02-27 NOTE — Telephone Encounter (Signed)
EUS scheduled, pt instructed and medications reviewed.  Patient instructions mailed to home.  Patient to call with any questions or concerns. COVID testing also advised. Letter as been sent to cardiology to hold [facility]. The pt states he has already been told by cardiology MD that he can hold for 3 days.

## 2019-02-27 NOTE — Telephone Encounter (Signed)
[facility] Pre-operative Risk Assessment     Request for surgical clearance:     Endoscopy Procedure  What type of surgery is being performed?     EUS  When is this surgery scheduled?     03/12/19  What type of clearance is required ?   Pharmacy  Are there any medications that need to be held prior to surgery and how long? Eliquis  Practice name and name of physician performing surgery?      [facility]  What is your office phone and fax number?      Phone- [phone]  [phone]  Anesthesia type (None, local, MAC, general) ?       MAC

## 2019-02-27 NOTE — Telephone Encounter (Signed)
Patient with diagnosis of ATRIAL FIBRILLATION on ELIQUIS for anticoagulation.    Procedure: ENDOSCOPY Date of procedure: 03-12-2019  CHADS2-VASc score of  3 (CHF, HTN, CAD)  SCR = 1.0 ON DEC/23/2020   Per office protocol, patient can hold ELIQUIS for 3 days prior to procedure.     *Per Dr Ellyn Hack last note, patient can hold Eliquis 5-7 days around the procedure*

## 2019-02-27 NOTE — Telephone Encounter (Signed)
-----   Message from Milus Banister, MD sent at 02/27/2019  5:37 AM EST ----- Regarding: RE: EUS with FNA Cystic lesion in pancreas with associated dilated main pancreatic duct.  Chronically on 4L oxygen and his HR was very low in GI office yesterday.  Agree with EUS first available appt, hopefully he can be 'tuned up' from cardiology perspective by then.  Gargi Berch, He needs upper EUS, first available with Gabe or myself.  Add him to my 2/11 Thursday if he cannot be done sooner.  Thanks  Wynetta Fines  ----- Message ----- From: Timothy Lasso, RN Sent: 02/26/2019   4:59 PM EST To: Milus Banister, MD, # Subject: FW: EUS with FNA                                ----- Message ----- From: Worthy Keeler Sent: 02/26/2019   4:43 PM EST To: Timothy Lasso, RN Subject: EUS with FNA                                   Our PA Laurine Blazer saw patient today and he needs EUS with FNA. All records should be in Epic. If you have questions, let me know. Thanks, Lelon Frohlich

## 2019-03-03 ENCOUNTER — Encounter: Payer: Self-pay | Admitting: Urology

## 2019-03-03 ENCOUNTER — Other Ambulatory Visit: Payer: Self-pay

## 2019-03-03 ENCOUNTER — Other Ambulatory Visit: Payer: Self-pay | Admitting: Cardiology

## 2019-03-03 ENCOUNTER — Ambulatory Visit (INDEPENDENT_AMBULATORY_CARE_PROVIDER_SITE_OTHER): Payer: Medicare Other | Admitting: Urology

## 2019-03-03 VITALS — BP 132/85 | HR 66 | Temp 96.8°F | Ht 71.0 in | Wt 225.0 lb

## 2019-03-03 DIAGNOSIS — N281 Cyst of kidney, acquired: Secondary | ICD-10-CM

## 2019-03-03 DIAGNOSIS — R748 Abnormal levels of other serum enzymes: Secondary | ICD-10-CM | POA: Diagnosis not present

## 2019-03-03 DIAGNOSIS — R933 Abnormal findings on diagnostic imaging of other parts of digestive tract: Secondary | ICD-10-CM | POA: Diagnosis not present

## 2019-03-03 DIAGNOSIS — N179 Acute kidney failure, unspecified: Secondary | ICD-10-CM | POA: Diagnosis not present

## 2019-03-03 DIAGNOSIS — R978 Other abnormal tumor markers: Secondary | ICD-10-CM | POA: Diagnosis not present

## 2019-03-03 LAB — POCT URINALYSIS DIPSTICK
Bilirubin, UA: NEGATIVE
Blood, UA: NEGATIVE
Glucose, UA: NEGATIVE
Ketones, UA: NEGATIVE
Leukocytes, UA: NEGATIVE
Nitrite, UA: NEGATIVE
Protein, UA: NEGATIVE
Spec Grav, UA: 1.015 (ref 1.010–1.025)
Urobilinogen, UA: NEGATIVE E.U./dL — AB
pH, UA: 5 (ref 5.0–8.0)

## 2019-03-03 NOTE — Telephone Encounter (Signed)
Rx has been sent to the pharmacy electronically. ° °

## 2019-03-03 NOTE — Progress Notes (Signed)
Urological Symptom Review  Patient is experiencing the following symptoms: Frequent urination Stream starts and stops   Review of Systems  Gastrointestinal (upper)  : Indigestion/heartburn  Gastrointestinal (lower) : Negative for lower GI symptoms  Constitutional : Negative for symptoms  Skin: Negative for skin symptoms  Eyes: Negative for eye symptoms  Ear/Nose/Throat : Sinus problems  Hematologic/Lymphatic: Easy bruising  Cardiovascular : Negative for cardiovascular symptoms  Respiratory : Cough Shortness of breath  Endocrine: Excessive thirst  Musculoskeletal: Negative for musculoskeletal symptoms  Neurological: Negative for neurological symptoms  Psychologic: anxiety

## 2019-03-03 NOTE — Progress Notes (Signed)
H&P  Chief Complaint: Renal Mass (Left)  History of Present Illness:   2.2.2021: Here today for urological evaluation of left renal mass incidentally found on CT scan (12.15.20) for evaluation of RUQ pain that he had been experiencing for 3 mo's according to his records (he states it has been closer to years). He denies any hx of urinary issues.   Past Medical History:  Diagnosis Date  . Anxiety   . Asthma   . Atrial fibrillation with RVR - peri-MI; recurrent after DCCV 11/19/2012  . CAD S/P percutaneous coronary angioplasty  11/18/2012   - PCI mid RCA - 5.0 mm x 24 mm VeriFlex BMS (5.6 mm)  . Chronic combined systolic and diastolic heart failure, NYHA class 2 ([place]) 11/17/2012  . Colon polyp    Status post polypectomy x3.  [name] COPD (chronic obstructive pulmonary disease) ([place])   . Dyslipidemia- low HDL 11/19/2012  . Heart attack ([place])   . Heart disease   . Heartburn   . High cholesterol   . Hypertension   . Ischemic cardiomyopathy 11/19/2012   EF 40-45% with inferoseptal HK, Gr 2DD - high LVEDP/LAP. --> confirmed by f/u Echo 03/2013.  [name] STEMI of inferior wall 11/18/12- RCA BMS 11/17/2012   Cardiogenic shock - post MI with RV infarction [785.51]  . Stomach ulcer   . Tobacco abuse    40 Pk-yr (1- 1 1/2 PPD) --> Quit 11/18/2012 when presented with STEMI    Past Surgical History:  Procedure Laterality Date  . COLONOSCOPY N/A 11/20/2013   Procedure: COLONOSCOPY;  Surgeon: Jamesetta So, MD;  [facility]: [facility];  Service: Gastroenterology;  Laterality: N/A;  . CORONARY ANGIOPLASTY WITH STENT PLACEMENT Right 11/17/12   Mid RCA aspiration thrombectomy and PCI with VeriFlex BMS 5.0 mm x 24 mm (5.6 mm; also PTCA of distal RPL 4 distal embolization.  . Dental extractions    . HERNIA REPAIR  1997, 2010?  [name] LEFT HEART CATHETERIZATION WITH CORONARY ANGIOGRAM N/A 11/17/2012   Procedure: LEFT HEART CATHETERIZATION WITH CORONARY ANGIOGRAM;  Surgeon: Leonie Man, MD;  [facility] CATH LAB: for  Inferior STEMI -> EF 50-55%-mod basal -apical inferior HK. (LCA- Normal) LM -very large -->Tortuous, wraparound LAD -> Large bifurcating D1. Cx -large, no-dom - large LatOM1, small AVG &2 small OMs.  RCA (Very Large) - m90% subtototal -> RPAV(large PL2) & rPDA mild distal embolization  . TOE SURGERY     great toe  . TONSILLECTOMY    . TRANSTHORACIC ECHOCARDIOGRAM  11/18/2012   Normal LV size, moderate reduced function 40-45% with severe HK-AK of the inferoseptal wall with incoordinate septal motion.  Domingo Dimes ECHOCARDIOGRAM  March 15   Moderately reduced LVEF of 40 at 45% with inferoseptal hypokinesis and high filling pressures.  . TRANSTHORACIC ECHOCARDIOGRAM  04/2015   EF 55-60%.; Pericardial fat pad versus effusion; no tamponade    Home Medications:  Allergies as of 03/03/2019      Reactions   Codeine    Red blotches   Penicillins    Childhood reaction      Medication List       Accurate as of March 03, 2019 10:41 AM. If you have any questions, ask your nurse or doctor.        acetaminophen 500 MG tablet Commonly known as: TYLENOL Take by mouth every 6 (six) hours as needed for pain.   albuterol 108 (90 Base) MCG/ACT inhaler Commonly known as: VENTOLIN HFA Inhale 2 puffs into the lungs every  6 (six) hours as needed for wheezing.   bisoprolol 5 MG tablet Commonly known as: ZEBETA TAKE 1/2 TABLET BY MOUTH EVERY DAY   Co Q-10 100 MG Caps Take 300 mg by mouth daily.   Eliquis 5 MG Tabs tablet Generic drug: apixaban TAKE 1 TABLET BY MOUTH TWICE DAILY   furosemide 40 MG tablet Commonly known as: LASIX TAKE ONE TO TWO TABLETS BY MOUTH EVERY MORNING AS NEEDED AND TAKE 1 TABLET BY MOUTH EVERY EVENING   levalbuterol 0.63 MG/3ML nebulizer solution Commonly known as: XOPENEX Take 3 mLs (0.63 mg total) by nebulization every 3 (three) hours as needed for wheezing or shortness of breath.   levofloxacin 500 MG tablet Commonly known as: LEVAQUIN Take 500 mg by mouth  daily.   losartan 50 MG tablet Commonly known as: COZAAR TAKE 1 TABLET BY MOUTH EVERY DAY   nitroGLYCERIN 0.4 MG SL tablet Commonly known as: NITROSTAT Place 1 tablet (0.4 mg total) under the tongue every 5 (five) minutes x 3 doses as needed for chest pain.   OXYGEN Inhale into the lungs. Using 4 liters nasal cannula ,continuous   pantoprazole 40 MG tablet Commonly known as: PROTONIX Take 40 mg by mouth daily.   potassium chloride 10 MEQ tablet Commonly known as: KLOR-CON TAKE 1 TABLET BY MOUTH EVERY DAY   predniSONE 10 MG tablet Commonly known as: DELTASONE Take 10 mg by mouth daily with breakfast.   rosuvastatin 40 MG tablet Commonly known as: CRESTOR TAKE 1 TABLET BY MOUTH EVERY DAY   sodium chloride 0.65 % Soln nasal spray Commonly known as: OCEAN Place 1 spray into both nostrils as needed for congestion.   Trelegy Ellipta 100-62.5-25 MCG/INH Aepb Generic drug: Fluticasone-Umeclidin-Vilant Inhale 1 puff into the lungs daily.       Allergies:  Allergies  Allergen Reactions  . Codeine     Red blotches  . Penicillins     Childhood reaction    Family History  Problem Relation Age of Onset  . Heart attack Mother   . Heart attack Father   . Cancer Sister     Social History:  reports that he quit smoking about 7 years ago. He has a 40.00 pack-year smoking history. He has never used smokeless tobacco. He reports that he does not drink alcohol or use drugs.  ROS: A complete review of systems was performed.  All systems are negative except for pertinent findings as noted.  Physical Exam:  Vital signs in last 24 hours: BP 132/85   Pulse 66   Temp (!) 96.8 F (36 C)   Ht 5\' 11"  (1.803 m)   Wt 225 lb (102.1 kg)   BMI 31.38 kg/m  Constitutional:  Alert and oriented, No acute distress, Obese Cardiovascular: Regular rate  Respiratory: Normal respiratory effort. On supplemental oxygen (with large O2 tank) GI: Abdomen is soft, nontender, nondistended, no  abdominal masses. No CVAT.  Genitourinary: Normal male phallus, testes are descended bilaterally and non-tender and without masses, scrotum is normal in appearance without lesions or masses, perineum is normal on inspection. Lymphatic: No lymphadenopathy Neurologic: Grossly intact, no focal deficits Psychiatric: Normal mood and affect  I have reviewed prior pt notes  I have reviewed notes from referring/previous physicians  I have reviewed urinalysis results  I have independently reviewed prior imaging--MRI/CT images     Laboratory Data:  No results for input(s): WBC, HGB, HCT, PLT in the last 72 hours.  No results for input(s): NA, K, CL, GLUCOSE, BUN,  CALCIUM, CREATININE in the last 72 hours.  Invalid input(s): CO3   Results for orders placed or performed in visit on 03/03/19 (from the past 24 hour(s))  POCT urinalysis dipstick     Status: Abnormal   Collection Time: 03/03/19 10:27 AM  Result Value Ref Range   Color, UA yellow    Clarity, UA     Glucose, UA Negative Negative   Bilirubin, UA neg    Ketones, UA neg    Spec Grav, UA 1.015 1.010 - 1.025   Blood, UA neg    pH, UA 5.0 5.0 - 8.0   Protein, UA Negative Negative   Urobilinogen, UA negative (A) 0.2 or 1.0 E.U./dL   Nitrite, UA neg    Leukocytes, UA Negative Negative   Appearance     Odor     No results found for this or any previous visit (from the past 240 hour(s)).  Renal Function: No results for input(s): CREATININE in the last 168 hours. CrCl cannot be calculated (Patient's most recent lab result is older than the maximum 21 days allowed.).  Radiologic Imaging: No results found.  Impression/Assessment:  I discussed his recent CT (specifically appearance of left kidney) --cyst on lower  pole of left kidney more than likely a benign cyst (category I). Mass on apex of left kidney, as diagnosed by follow-up MRI, is more than likely a Bosniak category II cyst. These are both benign in presentation and he  is not currently symptomatic w/ any urinary issues. No need for further evaluation of these cysts.   Plan:   He was assured that these masses are more than likely benign cysts that should be no cause of concern for him.  cc: Dr Gerarda Fraction

## 2019-03-04 LAB — CBC WITH DIFFERENTIAL/PLATELET
Absolute Monocytes: 651 cells/uL (ref 200–950)
Basophils Absolute: 18 cells/uL (ref 0–200)
Basophils Relative: 0.2 %
Eosinophils Absolute: 79 cells/uL (ref 15–500)
Eosinophils Relative: 0.9 %
HCT: 38.6 % (ref 38.5–50.0)
Hemoglobin: 12.7 g/dL — ABNORMAL LOW (ref 13.2–17.1)
Lymphs Abs: 1681 cells/uL (ref 850–3900)
MCH: 29.6 pg (ref 27.0–33.0)
MCHC: 32.9 g/dL (ref 32.0–36.0)
MCV: 90 fL (ref 80.0–100.0)
MPV: 11.2 fL (ref 7.5–12.5)
Monocytes Relative: 7.4 %
Neutro Abs: 6371 cells/uL (ref 1500–7800)
Neutrophils Relative %: 72.4 %
Platelets: 338 10*3/uL (ref 140–400)
RBC: 4.29 10*6/uL (ref 4.20–5.80)
RDW: 13.1 % (ref 11.0–15.0)
Total Lymphocyte: 19.1 %
WBC: 8.8 10*3/uL (ref 3.8–10.8)

## 2019-03-04 LAB — HEPATIC FUNCTION PANEL
AG Ratio: 1.7 (calc) (ref 1.0–2.5)
ALT: 16 U/L (ref 9–46)
AST: 17 U/L (ref 10–35)
Albumin: 4.6 g/dL (ref 3.6–5.1)
Alkaline phosphatase (APISO): 73 U/L (ref 35–144)
Bilirubin, Direct: 0.1 mg/dL (ref 0.0–0.2)
Globulin: 2.7 g/dL (calc) (ref 1.9–3.7)
Indirect Bilirubin: 0.3 mg/dL (calc) (ref 0.2–1.2)
Total Bilirubin: 0.4 mg/dL (ref 0.2–1.2)
Total Protein: 7.3 g/dL (ref 6.1–8.1)

## 2019-03-04 LAB — LIPASE: Lipase: 156 U/L — ABNORMAL HIGH (ref 7–60)

## 2019-03-04 LAB — CANCER ANTIGEN 19-9: CA 19-9: 68 U/mL — ABNORMAL HIGH (ref <34)

## 2019-03-04 LAB — AMYLASE: Amylase: 88 U/L (ref 21–101)

## 2019-03-09 ENCOUNTER — Other Ambulatory Visit (HOSPITAL_COMMUNITY)
Admission: RE | Admit: 2019-03-09 | Discharge: 2019-03-09 | Disposition: A | Discharge status: Home / Self Care | Payer: Medicare Other | Source: Ambulatory Visit | Attending: Gastroenterology | Admitting: Gastroenterology

## 2019-03-09 DIAGNOSIS — Z01812 Encounter for preprocedural laboratory examination: Secondary | ICD-10-CM | POA: Insufficient documentation

## 2019-03-09 DIAGNOSIS — Z20822 Contact with and (suspected) exposure to covid-19: Secondary | ICD-10-CM | POA: Diagnosis not present

## 2019-03-09 LAB — SARS CORONAVIRUS 2 (TAT 6-24 HRS): SARS Coronavirus 2: NEGATIVE

## 2019-03-11 NOTE — Progress Notes (Signed)
Spoke with patient, has quarantined without new symptoms, answered questions regarding procedure tomorrow.  Arrival time 1130 am.

## 2019-03-12 ENCOUNTER — Ambulatory Visit (HOSPITAL_COMMUNITY): Payer: Medicare Other | Admitting: Anesthesiology

## 2019-03-12 ENCOUNTER — Encounter (HOSPITAL_COMMUNITY)
Admission: RE | Disposition: A | Discharge status: Home / Self Care | Payer: Self-pay | Source: Home / Self Care | Attending: Gastroenterology

## 2019-03-12 ENCOUNTER — Ambulatory Visit (HOSPITAL_COMMUNITY)
Admission: RE | Admit: 2019-03-12 | Discharge: 2019-03-12 | Disposition: A | Discharge status: Home / Self Care | Payer: Medicare Other | Attending: Gastroenterology | Admitting: Gastroenterology

## 2019-03-12 ENCOUNTER — Encounter (HOSPITAL_COMMUNITY): Payer: Self-pay | Admitting: Gastroenterology

## 2019-03-12 ENCOUNTER — Other Ambulatory Visit: Payer: Self-pay

## 2019-03-12 DIAGNOSIS — J449 Chronic obstructive pulmonary disease, unspecified: Secondary | ICD-10-CM | POA: Insufficient documentation

## 2019-03-12 DIAGNOSIS — Z7901 Long term (current) use of anticoagulants: Secondary | ICD-10-CM | POA: Insufficient documentation

## 2019-03-12 DIAGNOSIS — K8689 Other specified diseases of pancreas: Secondary | ICD-10-CM | POA: Diagnosis not present

## 2019-03-12 DIAGNOSIS — Z7951 Long term (current) use of inhaled steroids: Secondary | ICD-10-CM | POA: Insufficient documentation

## 2019-03-12 DIAGNOSIS — I110 Hypertensive heart disease with heart failure: Secondary | ICD-10-CM | POA: Diagnosis not present

## 2019-03-12 DIAGNOSIS — I255 Ischemic cardiomyopathy: Secondary | ICD-10-CM | POA: Diagnosis not present

## 2019-03-12 DIAGNOSIS — E785 Hyperlipidemia, unspecified: Secondary | ICD-10-CM | POA: Insufficient documentation

## 2019-03-12 DIAGNOSIS — S3633XA Laceration of stomach, initial encounter: Secondary | ICD-10-CM | POA: Insufficient documentation

## 2019-03-12 DIAGNOSIS — Z87891 Personal history of nicotine dependence: Secondary | ICD-10-CM | POA: Diagnosis not present

## 2019-03-12 DIAGNOSIS — I252 Old myocardial infarction: Secondary | ICD-10-CM | POA: Insufficient documentation

## 2019-03-12 DIAGNOSIS — X58XXXA Exposure to other specified factors, initial encounter: Secondary | ICD-10-CM | POA: Insufficient documentation

## 2019-03-12 DIAGNOSIS — Z7952 Long term (current) use of systemic steroids: Secondary | ICD-10-CM | POA: Insufficient documentation

## 2019-03-12 DIAGNOSIS — I5042 Chronic combined systolic (congestive) and diastolic (congestive) heart failure: Secondary | ICD-10-CM | POA: Insufficient documentation

## 2019-03-12 DIAGNOSIS — I5084 End stage heart failure: Secondary | ICD-10-CM | POA: Diagnosis not present

## 2019-03-12 DIAGNOSIS — K862 Cyst of pancreas: Secondary | ICD-10-CM

## 2019-03-12 DIAGNOSIS — I480 Paroxysmal atrial fibrillation: Secondary | ICD-10-CM | POA: Diagnosis not present

## 2019-03-12 DIAGNOSIS — R896 Abnormal cytological findings in specimens from other organs, systems and tissues: Secondary | ICD-10-CM | POA: Diagnosis not present

## 2019-03-12 DIAGNOSIS — R933 Abnormal findings on diagnostic imaging of other parts of digestive tract: Secondary | ICD-10-CM | POA: Diagnosis present

## 2019-03-12 DIAGNOSIS — I2510 Atherosclerotic heart disease of native coronary artery without angina pectoris: Secondary | ICD-10-CM | POA: Insufficient documentation

## 2019-03-12 DIAGNOSIS — Z9981 Dependence on supplemental oxygen: Secondary | ICD-10-CM | POA: Insufficient documentation

## 2019-03-12 DIAGNOSIS — Z79899 Other long term (current) drug therapy: Secondary | ICD-10-CM | POA: Diagnosis not present

## 2019-03-12 DIAGNOSIS — Z955 Presence of coronary angioplasty implant and graft: Secondary | ICD-10-CM | POA: Diagnosis not present

## 2019-03-12 DIAGNOSIS — K838 Other specified diseases of biliary tract: Secondary | ICD-10-CM

## 2019-03-12 HISTORY — PX: EUS: SHX5427

## 2019-03-12 HISTORY — PX: ESOPHAGOGASTRODUODENOSCOPY (EGD) WITH PROPOFOL: SHX5813

## 2019-03-12 HISTORY — PX: FINE NEEDLE ASPIRATION: SHX5430

## 2019-03-12 SURGERY — UPPER ENDOSCOPIC ULTRASOUND (EUS) RADIAL
Anesthesia: Monitor Anesthesia Care

## 2019-03-12 MED ORDER — SODIUM CHLORIDE 0.9 % IV SOLN: INTRAVENOUS | Status: DC

## 2019-03-12 MED ORDER — CIPROFLOXACIN IN D5W 400 MG/200ML IV SOLN
INTRAVENOUS | Status: DC | PRN
  Administered 2019-03-12: 400 mg via INTRAVENOUS

## 2019-03-12 MED ORDER — PROPOFOL 500 MG/50ML IV EMUL
INTRAVENOUS | Status: AC
  Filled 2019-03-12: qty 50

## 2019-03-12 MED ORDER — CIPROFLOXACIN IN D5W 400 MG/200ML IV SOLN
INTRAVENOUS | Status: AC
  Filled 2019-03-12: qty 200

## 2019-03-12 MED ORDER — PHENYLEPHRINE HCL-NACL 10-0.9 MG/250ML-% IV SOLN
INTRAVENOUS | Status: DC | PRN
  Administered 2019-03-12: 25 ug/min via INTRAVENOUS

## 2019-03-12 MED ORDER — PROPOFOL 1000 MG/100ML IV EMUL
INTRAVENOUS | Status: AC
  Filled 2019-03-12: qty 100

## 2019-03-12 MED ORDER — LIDOCAINE HCL (CARDIAC) PF 100 MG/5ML IV SOSY
PREFILLED_SYRINGE | INTRAVENOUS | Status: DC | PRN
  Administered 2019-03-12: 75 mg via INTRAVENOUS

## 2019-03-12 MED ORDER — ONDANSETRON HCL 4 MG/2ML IJ SOLN
INTRAMUSCULAR | Status: DC | PRN
  Administered 2019-03-12: 4 mg via INTRAVENOUS

## 2019-03-12 MED ORDER — ALBUTEROL SULFATE (2.5 MG/3ML) 0.083% IN NEBU
INHALATION_SOLUTION | RESPIRATORY_TRACT | Status: AC
  Filled 2019-03-12: qty 3

## 2019-03-12 MED ORDER — PROPOFOL 10 MG/ML IV BOLUS
INTRAVENOUS | Status: AC
  Filled 2019-03-12: qty 20

## 2019-03-12 MED ORDER — LACTATED RINGERS IV SOLN
INTRAVENOUS | Status: DC
  Administered 2019-03-12: 1000 mL via INTRAVENOUS

## 2019-03-12 MED ORDER — ALBUTEROL SULFATE (2.5 MG/3ML) 0.083% IN NEBU
2.5000 mg | INHALATION_SOLUTION | RESPIRATORY_TRACT | Status: AC
  Administered 2019-03-12: 2.5 mg via RESPIRATORY_TRACT

## 2019-03-12 MED ORDER — PROPOFOL 500 MG/50ML IV EMUL
INTRAVENOUS | Status: DC | PRN
  Administered 2019-03-12: 200 ug/kg/min via INTRAVENOUS

## 2019-03-12 SURGICAL SUPPLY — 15 items

## 2019-03-12 NOTE — Anesthesia Preprocedure Evaluation (Signed)
Anesthesia Evaluation  Patient identified by MRN, date of birth, ID band Patient awake    Reviewed: Allergy & Precautions, NPO status , Patient's Chart, lab work & pertinent test results  History of Anesthesia Complications Negative for: history of anesthetic complications  Airway Mallampati: II  TM Distance: >3 FB Neck ROM: Full    Dental  (+) Edentulous Upper, Edentulous Lower   Pulmonary COPD (4L home O2),  oxygen dependent, former smoker,    Pulmonary exam normal        Cardiovascular hypertension, + CAD, + Past MI, + Cardiac Stents and +CHF (EF recovered 2017)  Normal cardiovascular exam+ dysrhythmias Atrial Fibrillation      Neuro/Psych negative neurological ROS  negative psych ROS   GI/Hepatic Neg liver ROS, PUD, GERD  ,  Endo/Other  negative endocrine ROS  Renal/GU negative Renal ROS  negative genitourinary   Musculoskeletal negative musculoskeletal ROS (+)   Abdominal   Peds  Hematology negative hematology ROS (+)   Anesthesia Other Findings   Reproductive/Obstetrics                             Anesthesia Physical Anesthesia Plan  ASA: IV  Anesthesia Plan: MAC   Post-op Pain Management:    Induction: Intravenous  PONV Risk Score and Plan: Propofol infusion, TIVA and Treatment may vary due to age or medical condition  Airway Management Planned: Natural Airway, Nasal Cannula and Simple Face Mask  Additional Equipment: None  Intra-op Plan:   Post-operative Plan:   Informed Consent: I have reviewed the patients History and Physical, chart, labs and discussed the procedure including the risks, benefits and alternatives for the proposed anesthesia with the patient or authorized representative who has indicated his/her understanding and acceptance.       Plan Discussed with:   Anesthesia Plan Comments:         Anesthesia Quick Evaluation

## 2019-03-12 NOTE — Interval H&P Note (Signed)
History and Physical Interval Note:  03/12/2019 12:54 PM  Benjamin [name]  has presented today for surgery, with the diagnosis of pancreatic cyst dilated bile duct.  The various methods of treatment have been discussed with the patient and family. After consideration of risks, benefits and other options for treatment, the patient has consented to  Procedure(s): UPPER ENDOSCOPIC ULTRASOUND (EUS) RADIAL (N/A) ESOPHAGOGASTRODUODENOSCOPY (EGD) WITH PROPOFOL (N/A) as a surgical intervention.  The patient's history has been reviewed, patient examined, no change in status, stable for surgery.  I have reviewed the patient's chart and labs.  Questions were answered to the patient's satisfaction.     Milus Banister

## 2019-03-12 NOTE — Anesthesia Postprocedure Evaluation (Signed)
Anesthesia Post Note  Patient: Benjamin [name]  Procedure(s) Performed: UPPER ENDOSCOPIC ULTRASOUND (EUS) RADIAL (N/A ) ESOPHAGOGASTRODUODENOSCOPY (EGD) WITH PROPOFOL (N/A ) FINE NEEDLE ASPIRATION (FNA) LINEAR (N/A )     Patient location during evaluation: Endoscopy Anesthesia Type: MAC Level of consciousness: awake and alert Pain management: pain level controlled Vital Signs Assessment: post-procedure vital signs reviewed and stable Respiratory status: spontaneous breathing, nonlabored ventilation and respiratory function stable Cardiovascular status: blood pressure returned to baseline and stable Postop Assessment: no apparent nausea or vomiting Anesthetic complications: no    Last Vitals:  Vitals:   03/12/19 1520 03/12/19 1530  BP: 119/75   Pulse: (!) 58 62  Resp: 16 20  Temp:    SpO2: 100% 100%    Last Pain:  Vitals:   03/12/19 1520  TempSrc:   PainSc: 0-No pain                 Lidia Collum

## 2019-03-12 NOTE — Discharge Instructions (Signed)
YOU HAD AN ENDOSCOPIC PROCEDURE TODAY: Refer to the procedure report and other information in the discharge instructions given to you for any specific questions about what was found during the examination. If this information does not answer your questions, please call [place] office at [phone] to clarify.  ° °YOU SHOULD EXPECT: Some feelings of bloating in the abdomen. Passage of more gas than usual. Walking can help get rid of the air that was put into your GI tract during the procedure and reduce the bloating. If you had a lower endoscopy (such as a colonoscopy or flexible sigmoidoscopy) you may notice spotting of blood in your stool or on the toilet paper. Some abdominal soreness may be present for a day or two, also. ° °DIET: Your first meal following the procedure should be a light meal and then it is ok to progress to your normal diet. A half-sandwich or bowl of soup is an example of a good first meal. Heavy or fried foods are harder to digest and may make you feel nauseous or bloated. Drink plenty of fluids but you should avoid alcoholic beverages for 24 hours. If you had a esophageal dilation, please see attached instructions for diet.   ° °ACTIVITY: Your care partner should take you home directly after the procedure. You should plan to take it easy, moving slowly for the rest of the day. You can resume normal activity the day after the procedure however YOU SHOULD NOT DRIVE, use power tools, machinery or perform tasks that involve climbing or major physical exertion for 24 hours (because of the sedation medicines used during the test).  ° °SYMPTOMS TO REPORT IMMEDIATELY: °A gastroenterologist can be reached at any hour. Please call [phone]  for any of the following symptoms:  °Following lower endoscopy (colonoscopy, flexible sigmoidoscopy) °Excessive amounts of blood in the stool  °Significant tenderness, worsening of abdominal pains  °Swelling of the abdomen that is new, acute  °Fever of 100° or  higher  °Following upper endoscopy (EGD, EUS, ERCP, esophageal dilation) °Vomiting of blood or coffee ground material  °New, significant abdominal pain  °New, significant chest pain or pain under the shoulder blades  °Painful or persistently difficult swallowing  °New shortness of breath  °Black, tarry-looking or red, bloody stools ° °FOLLOW UP:  °If any biopsies were taken you will be contacted by phone or by letter within the next 1-3 weeks. Call [phone]  if you have not heard about the biopsies in 3 weeks.  °Please also call with any specific questions about appointments or follow up tests. ° °

## 2019-03-12 NOTE — Op Note (Addendum)
[facility] Patient Name: Benjamin [name] Procedure Date: 03/12/2019 MRN: [id number] Attending MD: Milus Banister , MD Date of Birth: 07/05/1955 CSN: [id number] Age: 64 Admit Type: Outpatient Procedure:                Upper EUS Indications:              abnormal main pancreatic duct on CT and MRI, severe                            COPD (chronic 4L [place] oxygen) Providers:                Milus Banister, MD, Cleda Daub, RN, Kary Kos, RN, Marguerita Merles, Technician, Theodora Blow, Technician Referring MD:              Medicines:                Monitored Anesthesia Care Complications:            No immediate complications. Estimated blood loss:                            None. Estimated Blood Loss:     Estimated blood loss: none. Procedure:                Pre-Anesthesia Assessment:                           - Prior to the procedure, a History and Physical                            was performed, and patient medications and                            allergies were reviewed. The patient's tolerance of                            previous anesthesia was also reviewed. The risks                            and benefits of the procedure and the sedation                            options and risks were discussed with the patient.                            All questions were answered, and informed consent                            was obtained. Prior Anticoagulants: The patient has  taken Eliquis (apixaban), last dose was 3 days                            prior to procedure. ASA Grade Assessment: IV - A                            patient with severe systemic disease that is a                            constant threat to life. After reviewing the risks                            and benefits, the patient was deemed in                            satisfactory condition to undergo the  procedure.                           After obtaining informed consent, the endoscope was                            passed under direct vision. Throughout the                            procedure, the patient's blood pressure, pulse, and                            oxygen saturations were monitored continuously. The                            [id number]) Olympus Radial EUS was                            introduced through the mouth, and advanced to the                            second part of duodenum. The [id number])                            Olympus duodenoscope was introduced through the                            mouth, and advanced to the second part of duodenum.                            The [id number]) Olympus Linear EUS was                            introduced through the mouth, and advanced to the                            second part of duodenum. The [id number] [id number])  Olympus gastroscope was introduced through the                            mouth, and advanced to the antrum of the stomach.                            The upper EUS was accomplished without difficulty.                            The patient tolerated the procedure well. Scope In: Scope Out: Findings:      Endoscopic findings: with duodenoscope and standard adult gastroscope:      1. Normal esophagus.      2. Normal major papilla.      3. Following evaluation with Olympus duodenoscope with disposible cap I       noticed a 5-6cm long superficial, oozing laceration along the lesser       curvature of the stomach with an associated tube of masserated gastric       mucosa. The bleeding resolved without need for intervention and resulted       in a 4-5cm blood clot in the stomach.      ENDOSONOGRAPHIC FINDING: :      1. The main pancreatic duct was dilated starting in the head of the       pancreas and continuing to the body/tail junction where it normalized        more distally in the tail. PD diameter in the head of pancreas was 33mm,       in the body of pancreas was 1.4cm and tortuous. There was soft tissue       within the main pancreatic duct, amorphous, and very soft, measuring       2-3cm maximally. I sampled the soft tissue within the main pancreatic       duct with two passes of a 22 gauge EUS FNA needle using color doppler to       avoid significant blood vessels. Final cytology results are pending.      2. Pancreatic parenchyma was otherwise normal.      3. No peripancratic adenopathy.      4. CBD was normal, non-dilated and contained no stones.      5. Normal gallbladder.      6. Limited views of the liver, spleen were normal. Impression:               - I believe he has IPMN of the main pancreatic                            duct. Await final pathology results. He has severe                            COPD (chronically on 4LNC) and so is not likely to                            be a surgical candidate.                           - Superficial laceration of the gastric mucosa  along the lesser curvature of the stomach. This was                            minor and the bleeding resolved without need for                            intervention. Moderate Sedation:      Not Applicable - Patient had care per Anesthesia. cipro 400mg  IV Recommendation:           - Discharge patient to home (ambulatory).                           - OK to resume your blood thinner on Saturday (in                            two days). Procedure Code(s):        --- Professional ---                           [phone], Esophagogastroduodenoscopy, flexible,                            transoral; with transendoscopic ultrasound-guided                            intramural or transmural fine needle                            aspiration/biopsy(s), (includes endoscopic                            ultrasound examination limited to the esophagus,                             stomach or duodenum, and adjacent structures) Diagnosis Code(s):        --- Professional ---                           K86.89, Other specified diseases of pancreas                           R93.3, Abnormal findings on diagnostic imaging of                            other parts of digestive tract CPT copyright 2019 American Medical Association. All rights reserved. The codes documented in this report are preliminary and upon coder review may  be revised to meet current compliance requirements. Milus Banister, MD 03/12/2019 2:44:15 PM This report has been signed electronically. Number of Addenda: 0

## 2019-03-12 NOTE — Anesthesia Procedure Notes (Signed)
Procedure Name: MAC Date/Time: 03/12/2019 1:02 PM Performed by: Lissa Morales, CRNA Pre-anesthesia Checklist: Patient identified, Emergency Drugs available, Suction available, Patient being monitored and Timeout performed Patient Re-evaluated:Patient Re-evaluated prior to induction Oxygen Delivery Method: Simple face mask Preoxygenation: Pre-oxygenation with 100% oxygen (POM mask) Placement Confirmation: positive ETCO2

## 2019-03-12 NOTE — Transfer of Care (Signed)
Immediate Anesthesia Transfer of Care Note  Patient: CURRY SEEFELDT  Procedure(s) Performed: UPPER ENDOSCOPIC ULTRASOUND (EUS) RADIAL (N/A ) ESOPHAGOGASTRODUODENOSCOPY (EGD) WITH PROPOFOL (N/A ) FINE NEEDLE ASPIRATION (FNA) LINEAR (N/A )  Patient Location: PACU  Anesthesia Type:MAC  Level of Consciousness: awake and patient cooperative  Airway & Oxygen Therapy: Patient Spontanous Breathing and Patient connected to face mask oxygen  Post-op Assessment: Report given to RN, Post -op Vital signs reviewed and stable and Patient moving all extremities X 4  Post vital signs: stable  Last Vitals:  Vitals Value Taken Time  BP 119/75 03/12/19 1520  Temp    Pulse 58 03/12/19 1520  Resp 16 03/12/19 1520  SpO2 100 % 03/12/19 1520    Last Pain:  Vitals:   03/12/19 1520  TempSrc:   PainSc: 0-No pain         Complications: No apparent anesthesia complications

## 2019-03-13 LAB — CYTOLOGY - NON PAP

## 2019-03-17 ENCOUNTER — Other Ambulatory Visit: Payer: Self-pay

## 2019-03-17 NOTE — Patient Outreach (Signed)
[facility]) Care Management  03/17/2019  Benjamin [name] November 03, 1955 [id number]   Medication Adherence call to Mr. Benjamin [name] [place] Identifiers Verify spoke with patient he is past due on Losartan 50 mg,patient explain he takes 1 tablet daily,patient has plenty at this time and receives and order every month from the pharmacy. [name] is showing past due under [place].   [facility] Direct Dial [phone]  Fax [phone] [email or web address]

## 2019-03-18 DIAGNOSIS — J449 Chronic obstructive pulmonary disease, unspecified: Secondary | ICD-10-CM | POA: Diagnosis not present

## 2019-03-25 ENCOUNTER — Other Ambulatory Visit: Payer: Self-pay

## 2019-04-15 DIAGNOSIS — J449 Chronic obstructive pulmonary disease, unspecified: Secondary | ICD-10-CM | POA: Diagnosis not present

## 2019-04-29 DIAGNOSIS — I5032 Chronic diastolic (congestive) heart failure: Secondary | ICD-10-CM | POA: Diagnosis not present

## 2019-04-29 DIAGNOSIS — I110 Hypertensive heart disease with heart failure: Secondary | ICD-10-CM | POA: Diagnosis not present

## 2019-04-29 DIAGNOSIS — J449 Chronic obstructive pulmonary disease, unspecified: Secondary | ICD-10-CM | POA: Diagnosis not present

## 2019-04-29 DIAGNOSIS — Z87891 Personal history of nicotine dependence: Secondary | ICD-10-CM | POA: Diagnosis not present

## 2019-05-04 ENCOUNTER — Institutional Professional Consult (permissible substitution): Payer: Medicare Other | Admitting: Internal Medicine

## 2019-05-16 DIAGNOSIS — J449 Chronic obstructive pulmonary disease, unspecified: Secondary | ICD-10-CM | POA: Diagnosis not present

## 2019-05-29 DIAGNOSIS — I5032 Chronic diastolic (congestive) heart failure: Secondary | ICD-10-CM | POA: Diagnosis not present

## 2019-05-29 DIAGNOSIS — I110 Hypertensive heart disease with heart failure: Secondary | ICD-10-CM | POA: Diagnosis not present

## 2019-05-29 DIAGNOSIS — J449 Chronic obstructive pulmonary disease, unspecified: Secondary | ICD-10-CM | POA: Diagnosis not present

## 2019-05-29 DIAGNOSIS — Z87891 Personal history of nicotine dependence: Secondary | ICD-10-CM | POA: Diagnosis not present

## 2019-06-03 DIAGNOSIS — Z0000 Encounter for general adult medical examination without abnormal findings: Secondary | ICD-10-CM | POA: Diagnosis not present

## 2019-06-03 DIAGNOSIS — I700 Atherosclerosis of aorta: Secondary | ICD-10-CM | POA: Diagnosis not present

## 2019-06-03 DIAGNOSIS — I110 Hypertensive heart disease with heart failure: Secondary | ICD-10-CM | POA: Diagnosis not present

## 2019-06-03 DIAGNOSIS — J441 Chronic obstructive pulmonary disease with (acute) exacerbation: Secondary | ICD-10-CM | POA: Diagnosis not present

## 2019-06-03 DIAGNOSIS — I10 Essential (primary) hypertension: Secondary | ICD-10-CM | POA: Diagnosis not present

## 2019-06-03 DIAGNOSIS — G894 Chronic pain syndrome: Secondary | ICD-10-CM | POA: Diagnosis not present

## 2019-06-03 DIAGNOSIS — K219 Gastro-esophageal reflux disease without esophagitis: Secondary | ICD-10-CM | POA: Diagnosis not present

## 2019-06-03 DIAGNOSIS — E782 Mixed hyperlipidemia: Secondary | ICD-10-CM | POA: Diagnosis not present

## 2019-06-03 DIAGNOSIS — Z1389 Encounter for screening for other disorder: Secondary | ICD-10-CM | POA: Diagnosis not present

## 2019-06-03 DIAGNOSIS — E039 Hypothyroidism, unspecified: Secondary | ICD-10-CM | POA: Diagnosis not present

## 2019-06-03 DIAGNOSIS — J9610 Chronic respiratory failure, unspecified whether with hypoxia or hypercapnia: Secondary | ICD-10-CM | POA: Diagnosis not present

## 2019-06-03 DIAGNOSIS — I480 Paroxysmal atrial fibrillation: Secondary | ICD-10-CM | POA: Diagnosis not present

## 2019-06-04 ENCOUNTER — Ambulatory Visit: Payer: Medicare Other | Admitting: Internal Medicine

## 2019-06-04 ENCOUNTER — Encounter: Payer: Self-pay | Admitting: Internal Medicine

## 2019-06-04 ENCOUNTER — Other Ambulatory Visit: Payer: Self-pay

## 2019-06-04 DIAGNOSIS — J449 Chronic obstructive pulmonary disease, unspecified: Secondary | ICD-10-CM | POA: Diagnosis not present

## 2019-06-04 DIAGNOSIS — J9612 Chronic respiratory failure with hypercapnia: Secondary | ICD-10-CM | POA: Insufficient documentation

## 2019-06-04 DIAGNOSIS — J9611 Chronic respiratory failure with hypoxia: Secondary | ICD-10-CM | POA: Insufficient documentation

## 2019-06-04 DIAGNOSIS — Z1389 Encounter for screening for other disorder: Secondary | ICD-10-CM | POA: Diagnosis not present

## 2019-06-04 DIAGNOSIS — Z0000 Encounter for general adult medical examination without abnormal findings: Secondary | ICD-10-CM | POA: Diagnosis not present

## 2019-06-04 DIAGNOSIS — E039 Hypothyroidism, unspecified: Secondary | ICD-10-CM | POA: Diagnosis not present

## 2019-06-04 NOTE — Patient Instructions (Signed)
Plan A = Automatic = Always=    Trelegy one click but take two deep drags then rinse and gargle   Plan B = Backup (to supplement plan A, not to replace it) Only use your albuterol inhaler as a rescue medication to be used if you can't catch your breath by resting or doing a relaxed purse lip breathing pattern.  - The less you use it, the better it will work when you need it. - Ok to use the inhaler up to 2 puffs  every 4 hours if you must but call for appointment if use goes up over your usual need - Don't leave home without it !!  (think of it like the spare tire for your car)   Work on inhaler technique:  relax and gently blow all the way out then take a nice smooth deep breath back in, triggering the inhaler at same time you start breathing in.  Hold for up to 5 seconds if you can.  Rinse and gargle with water when done      Plan C = Crisis (instead of Plan B but only if Plan B stops working) - only use your albuterol nebulizer if you first try Plan B and it fails to help > ok to use the nebulizer up to every 4 hours but if start needing it regularly call for immediate appointment   Plan D = Deltasone = prednisone = medrol - tak a course of medrol if not improving with ABC  Plan E = ER - go to ER or call 911 if all else fails    02 is 24/7 . Make sure you check your oxygen saturations at highest level of activity to be sure it stays over 90% and adjust upward to maintain this level if needed but remember to turn it back to previous settings when you stop (to conserve your supply).   Please schedule a follow up visit in 6 months but call sooner if needed

## 2019-06-04 NOTE — Assessment & Plan Note (Addendum)
02 dep since 2014 -  HC03  2018  = 32 to 34  - as of 06/04/2019 = 4lpm 24/7   Advised: Make sure you check your oxygen saturations at highest level of activity to be sure it stays over 90% and adjust upward to maintain this level if needed but remember to turn it back to previous settings when you stop (to conserve your supply).   Desperately needs to be more active to maintain neg cal balance and conditioning   Discussed in detail all the  indications, usual  risks and alternatives  relative to the benefits with patient who wants to put off surgery if at all possible    Would be very high risk for abd surgery so risk / benefit for now favors following conservatively/ note to Dr Ardis Hughs sent via Epic           Each maintenance medication was reviewed in detail including emphasizing most importantly the difference between maintenance and prns and under what circumstances the prns are to be triggered using an action plan format where appropriate.  Total time for H and P, chart review, counseling, teaching device (elipta and hfa)  and generating customized AVS unique to this office visit / charting = 60 min

## 2019-06-04 NOTE — Progress Notes (Addendum)
Alvia Grove, adult    DOB: 02-13-1955,     MRN: [id number]   Brief patient profile:  24 yowm  Quit smoking 2014 p onset around 2005 of sob and 02 dep at hs but since 2014 24/7 at 4 lpm and followed previously by Dr Luan Pulling so referred back  to pulmonary clinic in [facility]  06/04/2019  By Dr Ardis Hughs who has been following for Pancreatic cyst vs mass as pt has turned down lap.   History of Present Illness  06/04/2019  Pulmonary/ 1st office eval/Cornelius Schuitema / both s/p shots covid  Chief Complaint  Patient presents with  . Consult    head and nose congestion.  clear secretions when he blows his nose or coughs.  sob with any exertion.  Pollen has caused congestion.  establish with pulmonary doctor.  Dyspnea:  X  MMRC3 = can't walk 100 yards even at a slow pace at a flat grade s stopping due to sob / can't get groceries in from car s help/ still occ goes to [facility] x 200 ft flat  Cough: dry / lots of nasal congestion  Sleep: lie flat/ 4lpm / one pillow SABA use: proair 2-3 x day mostly p activity never neb  Wt 220 on year prior to OV  Up 15lb since quarantine  02 4lpm 24/7 not checking  No obvious day to day or daytime variability or assoc excess/ purulent sputum or mucus plugs or hemoptysis or cp or chest tightness, subjective wheeze or overt sinus or hb symptoms.   Sleeping as above  without nocturnal  or early am exacerbation  of respiratory  c/o's or need for noct saba. Also denies any obvious fluctuation of symptoms with weather or environmental changes or other aggravating or alleviating factors except as outlined above   No unusual exposure hx or h/o childhood pna/ asthma or knowledge of premature birth.  Current Allergies, Complete Past Medical History, Past Surgical History, Family History, and Social History were reviewed in [employer] record.  ROS  The following are not active complaints unless bolded Hoarseness, sore throat, dysphagia, dental problems, itching,  sneezing,  nasal congestion or discharge of excess mucus or purulent secretions, ear ache,   fever, chills, sweats, unintended wt loss or wt gain, classically pleuritic or exertional cp,  orthopnea pnd or arm/hand swelling  or leg swelling, presyncope, palpitations, abdominal pain, anorexia, nausea, vomiting, diarrhea  or change in bowel habits or change in bladder habits, change in stools or change in urine, dysuria, hematuria,  rash, arthralgias, visual complaints, headache, numbness, weakness or ataxia or problems with walking or coordination,  change in mood or  memory.              Past Medical History:  Diagnosis Date  . Anxiety   . Asthma   . Atrial fibrillation with RVR - peri-MI; recurrent after DCCV 11/19/2012  . CAD S/P percutaneous coronary angioplasty  11/18/2012   - PCI mid RCA - 5.0 mm x 24 mm VeriFlex BMS (5.6 mm)  . Chronic combined systolic and diastolic heart failure, NYHA class 2 ([place]) 11/17/2012  . Colon polyp    Status post polypectomy x3.  [name] COPD (chronic obstructive pulmonary disease) ([place])   . Dyslipidemia- low HDL 11/19/2012  . Heart attack ([place])   . Heart disease   . Heartburn   . High cholesterol   . Hypertension   . Ischemic cardiomyopathy 11/19/2012   EF 40-45% with inferoseptal HK, Gr 2DD -  high LVEDP/LAP. --> confirmed by f/u Echo 03/2013.  [name] STEMI of inferior wall 11/18/12- RCA BMS 11/17/2012   Cardiogenic shock - post MI with RV infarction [785.51]  . Stomach ulcer   . Tobacco abuse    40 Pk-yr (1- 1 1/2 PPD) --> Quit 11/18/2012 when presented with STEMI    Outpatient Medications Prior to Visit  Medication Sig Dispense Refill  . acetaminophen (TYLENOL) 500 MG tablet Take 1,000 mg by mouth every 6 (six) hours as needed for moderate pain or headache.     . albuterol (PROVENTIL HFA;VENTOLIN HFA) 108 (90 BASE) MCG/ACT inhaler Inhale 2 puffs into the lungs every 6 (six) hours as needed for wheezing.    . cetirizine (ZYRTEC) 10 MG tablet Take 10 mg by  mouth daily as needed for allergies.    [name] ELIQUIS 5 MG TABS tablet TAKE 1 TABLET BY MOUTH TWICE DAILY (Patient taking differently: Take 5 mg by mouth 2 (two) times daily. ) 180 tablet 0  . Fluticasone-Umeclidin-Vilant (TRELEGY ELLIPTA) 100-62.5-25 MCG/INH AEPB Inhale 1 puff into the lungs daily.    . furosemide (LASIX) 40 MG tablet TAKE ONE TO TWO TABLETS BY MOUTH EVERY MORNING AS NEEDED AND TAKE 1 TABLET BY MOUTH EVERY EVENING (Patient taking differently: Take 80 mg by mouth daily. ) 90 tablet 11  . losartan (COZAAR) 50 MG tablet TAKE 1 TABLET BY MOUTH EVERY DAY (Patient taking differently: Take 50 mg by mouth daily. ) 30 tablet 11  . OXYGEN-HELIUM IN Inhale into the lungs. Using 4 liters nasal cannula ,continuous    . pantoprazole (PROTONIX) 40 MG tablet Take 40 mg by mouth daily.    . potassium chloride (KLOR-CON) 10 MEQ tablet TAKE 1 TABLET BY MOUTH EVERY DAY (Patient taking differently: Take 10 mEq by mouth daily. ) 30 tablet 11  . rosuvastatin (CRESTOR) 40 MG tablet TAKE 1 TABLET BY MOUTH EVERY DAY (Patient taking differently: Take 40 mg by mouth daily. ) 30 tablet 11  . sodium chloride (OCEAN) 0.65 % SOLN nasal spray Place 1 spray into both nostrils as needed for congestion.    [name] alum & mag hydroxide-simeth (MAALOX/MYLANTA) 200-200-20 MG/5ML suspension Take 15 mLs by mouth every 6 (six) hours as needed for indigestion or heartburn.    . bisoprolol (ZEBETA) 5 MG tablet TAKE 1/2 TABLET BY MOUTH EVERY DAY (Patient taking differently: Take 2.5 mg by mouth daily. ) 15 tablet 11  . levalbuterol (XOPENEX) 0.63 MG/3ML nebulizer solution Take 3 mLs (0.63 mg total) by nebulization every 3 (three) hours as needed for wheezing or shortness of breath. (Patient not taking: Reported on 06/04/2019)    . nitroGLYCERIN (NITROSTAT) 0.4 MG SL tablet Place 1 tablet (0.4 mg total) under the tongue every 5 (five) minutes x 3 doses as needed for chest pain. (Patient not taking: Reported on 06/04/2019) 25 tablet 2    plus  medrol pack per Dr Virgina Jock   Objective:     BP 115/80 (BP Location: Right Arm, Cuff Size: Normal)   Pulse 72   Temp (!) 97 F (36.1 C) (Temporal)   Ht 5\' 11"  (1.803 m)   Wt 235 lb (106.6 kg)   SpO2 90%   BMI 32.78 kg/m   SpO2: 90 % O2 Type: Pulse O2 O2 Flow Rate (L/min): 4 L/min   Obese wm slt gruff voice      HEENT : pt wearing mask not removed for exam due to covid -19 concerns.    NECK :  without JVD/Nodes/TM/  nl carotid upstrokes bilaterally   LUNGS: no acc muscle use,  Mod barrel  contour chest wall with bilateral  Distant insp/exp rhonchi  and  without cough on insp or exp maneuvers and mod  Hyperresonant  to  percussion bilaterally     CV:  RRR  no s3 or murmur or increase in P2, and no edema   ABD: quite obese  soft and nontender with pos mid insp Hoover's  in the supine position. No bruits or organomegaly appreciated, bowel sounds nl  MS:     ext warm without deformities, calf tenderness, cyanosis or clubbing No obvious joint restrictions   SKIN: warm and dry without lesions    NEURO:  alert, approp, nl sensorium with  no motor or cerebellar deficits apparent.         I personally reviewed images and agree with radiology impression as follows:  CXR:   11/11/2018 PA and Lat Hyperinflated lungs with emphysema.  No change from 08/01/2015     Assessment   COPD GOLD IV/ 02 dep  Quit smoking 2014 Spirometry  11/11/13   FEV1 0.88 (22%)  Ratio 0.38 with classic concave curvature  - 06/04/2019  After extensive coaching inhaler device,  effectiveness =    75% (short Ti)    Group D in terms of symptom/risk and laba/lama/ICS  therefore appropriate rx at this point >>>  trelegy approp/ ABCD=deltasone reviewed   Advised: I spent extra time with pt today reviewing appropriate use of albuterol for prn use on exertion with the following points: 1) saba is for relief of sob that does not improve by walking a slower pace or resting but rather if the pt does not improve  after trying this first. 2) If the pt is convinced, as many are, that saba helps recover from activity faster then it's easy to tell if this is the case by re-challenging : ie stop, take the inhaler, then p 5 minutes try the exact same activity (intensity of workload) that just caused the symptoms and see if they are substantially diminished or not after saba 3) if there is an activity that reproducibly causes the symptoms, try the saba 15 min before the activity on alternate days   If in fact the saba really does help, then fine to continue to use it prn but advised may need to look closer at the maintenance regimen being used to achieve better control of airways disease with exertion.    >>> f/u q 6 m, sooner prn        Chronic respiratory failure with hypoxia and hypercapnia ([place]) 02 dep since 2014 -  HC03  2018  = 32 to 34  - as of 06/04/2019 = 4lpm 24/7   Advised: Make sure you check your oxygen saturations at highest level of activity to be sure it stays over 90% and adjust upward to maintain this level if needed but remember to turn it back to previous settings when you stop (to conserve your supply).   Desperately needs to be more active to maintain neg cal balance and conditioning   Discussed in detail all the  indications, usual  risks and alternatives  relative to the benefits with patient who wants to put off surgery if at all possible    Would be very high risk for abd surgery so risk / benefit for now favors following conservatively/ note to Dr Ardis Hughs sent via Epic       Each maintenance medication was reviewed in  detail including emphasizing most importantly the difference between maintenance and prns and under what circumstances the prns are to be triggered using an action plan format where appropriate.  Total time for H and P, chart review, counseling, teaching device (elipta and hfa)  and generating customized AVS unique to this office visit / charting = 60 min        Christinia Gully, MD 06/04/2019

## 2019-06-04 NOTE — Assessment & Plan Note (Signed)
Quit smoking 2014 Spirometry  11/11/13   FEV1 0.88 (22%)  Ratio 0.38 with classic concave curvature  - 06/04/2019  After extensive coaching inhaler device,  effectiveness =    75% (short Ti)    Group D in terms of symptom/risk and laba/lama/ICS  therefore appropriate rx at this point >>>  trelegy approp/ ABCD=deltasone reviewed   Advised: I spent extra time with pt today reviewing appropriate use of albuterol for prn use on exertion with the following points: 1) saba is for relief of sob that does not improve by walking a slower pace or resting but rather if the pt does not improve after trying this first. 2) If the pt is convinced, as many are, that saba helps recover from activity faster then it's easy to tell if this is the case by re-challenging : ie stop, take the inhaler, then p 5 minutes try the exact same activity (intensity of workload) that just caused the symptoms and see if they are substantially diminished or not after saba 3) if there is an activity that reproducibly causes the symptoms, try the saba 15 min before the activity on alternate days   If in fact the saba really does help, then fine to continue to use it prn but advised may need to look closer at the maintenance regimen being used to achieve better control of airways disease with exertion.    >>> f/u q 6 m, sooner prn

## 2019-06-08 ENCOUNTER — Other Ambulatory Visit: Payer: Self-pay | Admitting: Cardiology

## 2019-06-15 DIAGNOSIS — J449 Chronic obstructive pulmonary disease, unspecified: Secondary | ICD-10-CM | POA: Diagnosis not present

## 2019-06-23 ENCOUNTER — Telehealth: Payer: Self-pay

## 2019-06-23 DIAGNOSIS — K862 Cyst of pancreas: Secondary | ICD-10-CM

## 2019-06-23 DIAGNOSIS — K838 Other specified diseases of biliary tract: Secondary | ICD-10-CM

## 2019-06-23 DIAGNOSIS — R933 Abnormal findings on diagnostic imaging of other parts of digestive tract: Secondary | ICD-10-CM

## 2019-06-23 NOTE — Telephone Encounter (Signed)
-----   Message from Timothy Lasso, RN sent at 03/25/2019 12:19 PM EST ----- MRI of the pancreas in 3 months to follow-up on the abnormal main pancreatic duct seen recently

## 2019-06-24 NOTE — Telephone Encounter (Signed)
You have been scheduled for an MRI at [facility] on 07/08/19. Your appointment time is 4 pm. Please arrive 30 minutes prior to your appointment time for registration purposes. Please make certain not to have anything to eat or drink 6 hours prior to your test. In addition, if you have any metal in your body, have a pacemaker or defibrillator, please be sure to let your ordering physician know. This test typically takes 45 minutes to 1 hour to complete. Should you need to reschedule, please call [phone] to do so.   The patient has been notified of this information and all questions answered.

## 2019-07-08 ENCOUNTER — Ambulatory Visit (HOSPITAL_COMMUNITY): Payer: Medicare Other

## 2019-07-16 DIAGNOSIS — J449 Chronic obstructive pulmonary disease, unspecified: Secondary | ICD-10-CM | POA: Diagnosis not present

## 2019-07-17 ENCOUNTER — Ambulatory Visit (HOSPITAL_COMMUNITY): Payer: Medicare Other

## 2019-07-28 ENCOUNTER — Ambulatory Visit (HOSPITAL_COMMUNITY): Payer: Medicare Other

## 2019-08-15 DIAGNOSIS — J449 Chronic obstructive pulmonary disease, unspecified: Secondary | ICD-10-CM | POA: Diagnosis not present

## 2019-08-28 DIAGNOSIS — I5032 Chronic diastolic (congestive) heart failure: Secondary | ICD-10-CM | POA: Diagnosis not present

## 2019-08-28 DIAGNOSIS — I480 Paroxysmal atrial fibrillation: Secondary | ICD-10-CM | POA: Diagnosis not present

## 2019-08-28 DIAGNOSIS — J449 Chronic obstructive pulmonary disease, unspecified: Secondary | ICD-10-CM | POA: Diagnosis not present

## 2019-08-28 DIAGNOSIS — I110 Hypertensive heart disease with heart failure: Secondary | ICD-10-CM | POA: Diagnosis not present

## 2019-08-31 ENCOUNTER — Other Ambulatory Visit: Payer: Self-pay | Admitting: Cardiology

## 2019-09-15 DIAGNOSIS — J449 Chronic obstructive pulmonary disease, unspecified: Secondary | ICD-10-CM | POA: Diagnosis not present

## 2019-10-16 DIAGNOSIS — J449 Chronic obstructive pulmonary disease, unspecified: Secondary | ICD-10-CM | POA: Diagnosis not present

## 2019-10-29 DIAGNOSIS — I480 Paroxysmal atrial fibrillation: Secondary | ICD-10-CM | POA: Diagnosis not present

## 2019-10-29 DIAGNOSIS — I5032 Chronic diastolic (congestive) heart failure: Secondary | ICD-10-CM | POA: Diagnosis not present

## 2019-10-29 DIAGNOSIS — J449 Chronic obstructive pulmonary disease, unspecified: Secondary | ICD-10-CM | POA: Diagnosis not present

## 2019-10-29 DIAGNOSIS — I110 Hypertensive heart disease with heart failure: Secondary | ICD-10-CM | POA: Diagnosis not present

## 2019-11-15 DIAGNOSIS — J449 Chronic obstructive pulmonary disease, unspecified: Secondary | ICD-10-CM | POA: Diagnosis not present

## 2019-11-29 ENCOUNTER — Other Ambulatory Visit: Payer: Self-pay | Admitting: Cardiology

## 2019-12-16 DIAGNOSIS — J449 Chronic obstructive pulmonary disease, unspecified: Secondary | ICD-10-CM | POA: Diagnosis not present

## 2019-12-29 DIAGNOSIS — I5032 Chronic diastolic (congestive) heart failure: Secondary | ICD-10-CM | POA: Diagnosis not present

## 2019-12-29 DIAGNOSIS — J449 Chronic obstructive pulmonary disease, unspecified: Secondary | ICD-10-CM | POA: Diagnosis not present

## 2019-12-29 DIAGNOSIS — I480 Paroxysmal atrial fibrillation: Secondary | ICD-10-CM | POA: Diagnosis not present

## 2019-12-29 DIAGNOSIS — Z87891 Personal history of nicotine dependence: Secondary | ICD-10-CM | POA: Diagnosis not present

## 2020-01-15 DIAGNOSIS — J449 Chronic obstructive pulmonary disease, unspecified: Secondary | ICD-10-CM | POA: Diagnosis not present

## 2020-01-29 DIAGNOSIS — J449 Chronic obstructive pulmonary disease, unspecified: Secondary | ICD-10-CM | POA: Diagnosis not present

## 2020-01-29 DIAGNOSIS — I480 Paroxysmal atrial fibrillation: Secondary | ICD-10-CM | POA: Diagnosis not present

## 2020-01-29 DIAGNOSIS — I110 Hypertensive heart disease with heart failure: Secondary | ICD-10-CM | POA: Diagnosis not present

## 2020-01-29 DIAGNOSIS — I5032 Chronic diastolic (congestive) heart failure: Secondary | ICD-10-CM | POA: Diagnosis not present

## 2020-02-03 DIAGNOSIS — E876 Hypokalemia: Secondary | ICD-10-CM | POA: Diagnosis not present

## 2020-02-09 DIAGNOSIS — J9610 Chronic respiratory failure, unspecified whether with hypoxia or hypercapnia: Secondary | ICD-10-CM | POA: Diagnosis not present

## 2020-02-09 DIAGNOSIS — D649 Anemia, unspecified: Secondary | ICD-10-CM | POA: Diagnosis not present

## 2020-02-09 DIAGNOSIS — I10 Essential (primary) hypertension: Secondary | ICD-10-CM | POA: Diagnosis not present

## 2020-02-09 DIAGNOSIS — J9620 Acute and chronic respiratory failure, unspecified whether with hypoxia or hypercapnia: Secondary | ICD-10-CM | POA: Diagnosis not present

## 2020-02-10 DIAGNOSIS — E538 Deficiency of other specified B group vitamins: Secondary | ICD-10-CM | POA: Diagnosis not present

## 2020-02-10 DIAGNOSIS — D649 Anemia, unspecified: Secondary | ICD-10-CM | POA: Diagnosis not present

## 2020-02-15 DIAGNOSIS — J449 Chronic obstructive pulmonary disease, unspecified: Secondary | ICD-10-CM | POA: Diagnosis not present

## 2020-02-23 ENCOUNTER — Other Ambulatory Visit: Payer: Self-pay | Admitting: Cardiology

## 2020-02-24 NOTE — Telephone Encounter (Signed)
22f,  106.6kg, scr (outdated) will send refil for 76month requiring labs to be completed

## 2020-02-27 DIAGNOSIS — I110 Hypertensive heart disease with heart failure: Secondary | ICD-10-CM | POA: Diagnosis not present

## 2020-02-27 DIAGNOSIS — Z87891 Personal history of nicotine dependence: Secondary | ICD-10-CM | POA: Diagnosis not present

## 2020-02-27 DIAGNOSIS — J449 Chronic obstructive pulmonary disease, unspecified: Secondary | ICD-10-CM | POA: Diagnosis not present

## 2020-02-27 DIAGNOSIS — I5032 Chronic diastolic (congestive) heart failure: Secondary | ICD-10-CM | POA: Diagnosis not present

## 2020-03-07 ENCOUNTER — Ambulatory Visit: Payer: Medicare Other | Admitting: Cardiology

## 2020-03-08 ENCOUNTER — Telehealth: Payer: Self-pay | Admitting: *Deleted

## 2020-03-08 NOTE — Telephone Encounter (Signed)
Called patient , Rn spoke to patient . Virtual appointment reschedule for in peson office visit  Feb 11.2022 at 11:40 am  Patient verbalized understanding.

## 2020-03-10 ENCOUNTER — Telehealth: Payer: Medicare Other | Admitting: Cardiology

## 2020-03-11 ENCOUNTER — Ambulatory Visit: Payer: Medicare Other | Admitting: Cardiology

## 2020-03-11 ENCOUNTER — Other Ambulatory Visit: Payer: Self-pay

## 2020-03-11 ENCOUNTER — Encounter: Payer: Self-pay | Admitting: Cardiology

## 2020-03-11 VITALS — BP 120/72 | HR 69 | Ht 71.0 in | Wt 233.0 lb

## 2020-03-11 DIAGNOSIS — I2510 Atherosclerotic heart disease of native coronary artery without angina pectoris: Secondary | ICD-10-CM

## 2020-03-11 DIAGNOSIS — I255 Ischemic cardiomyopathy: Secondary | ICD-10-CM

## 2020-03-11 DIAGNOSIS — J9611 Chronic respiratory failure with hypoxia: Secondary | ICD-10-CM

## 2020-03-11 DIAGNOSIS — J449 Chronic obstructive pulmonary disease, unspecified: Secondary | ICD-10-CM | POA: Diagnosis not present

## 2020-03-11 DIAGNOSIS — I2119 ST elevation (STEMI) myocardial infarction involving other coronary artery of inferior wall: Secondary | ICD-10-CM | POA: Diagnosis not present

## 2020-03-11 DIAGNOSIS — I10 Essential (primary) hypertension: Secondary | ICD-10-CM

## 2020-03-11 DIAGNOSIS — Z9861 Coronary angioplasty status: Secondary | ICD-10-CM

## 2020-03-11 DIAGNOSIS — J9612 Chronic respiratory failure with hypercapnia: Secondary | ICD-10-CM

## 2020-03-11 DIAGNOSIS — E1165 Type 2 diabetes mellitus with hyperglycemia: Secondary | ICD-10-CM

## 2020-03-11 DIAGNOSIS — E1169 Type 2 diabetes mellitus with other specified complication: Secondary | ICD-10-CM

## 2020-03-11 DIAGNOSIS — E785 Hyperlipidemia, unspecified: Secondary | ICD-10-CM | POA: Diagnosis not present

## 2020-03-11 DIAGNOSIS — I5032 Chronic diastolic (congestive) heart failure: Secondary | ICD-10-CM

## 2020-03-11 DIAGNOSIS — I480 Paroxysmal atrial fibrillation: Secondary | ICD-10-CM | POA: Diagnosis not present

## 2020-03-11 NOTE — Progress Notes (Signed)
Primary Care Provider: Redmond School, MD Cardiologist: Glenetta Hew, MD Electrophysiologist: None  Clinic Note: Chief Complaint  Patient presents with  . Follow-up    Annual.  Breathing getting progressively worse.  . Coronary Artery Disease    No angina  . Edema    Has been on coming awake, and taking increased dose of Lasix.   ===================================  ASSESSMENT/PLAN   Problem List Items Addressed This Visit    Type 2 diabetes mellitus with hyperglycemia, without long-term current use of insulin (HCC)   STEMI of inferior wall 11/18/12- RCA BMS - Primary (Chronic)    7-1/2 years out from his MI.  He had a pretty sizable infarct.  EF returned back to normal following PCI, and he really only has diastolic heart failure and potentially cor pulmonale type symptoms of heart failure.  No recurrent angina symptoms.      Relevant Orders   EKG 12-Lead (Completed)   COPD GOLD IV/ 02 dep  (Chronic)    Has been followed by Dr. Melvyn Novas is debating whether or not he would like to have a second opinion.  Currently on Trelegy, home oxygen      CAD S/P percutaneous coronary angioplasty - PCI mid RCA - 5.0 mm x 24 mm VeriFlex BMS (5.6 mm) (Chronic)    Very large caliber RCA treated with a bare-metal stent dilated up to 5.56 mm in diameter.  He had otherwise diffuse residual disease.  He has not had any real anginal symptoms since his MI.  More troubled by dyspnea or COPD.  Plan:  On bisoprolol for beta-1 selectivity-2.5 mg, 50 mg losartan  On stable dose of rosuvastatin doing pretty well controlled lipids based on previous labs.  No longer on Plavix as he is on apixaban.  Not on aspirin either.  No change medications.      Relevant Orders   EKG 12-Lead (Completed)   Cardiomyopathy, ischemic - EF 40-45%; now up to 55-60% (Chronic)   Relevant Orders   EKG 12-Lead (Completed)   Chronic diastolic HF (heart failure), NYHA class 2 (HCC) (Chronic)    He has a  combination of NYHA class II CHF symptoms but more significant COPD symptoms.  He is definitely putting on fluid weight, but I think he also has probably some just general body weight because of lack of exercise.  History limited by COPD.  He is on low-dose beta-blocker and ARB standing dose Lasix.   He is adjusting his Lasix dose based on his weights.  I told him probably needs to take 80 mg twice daily for about a week and then go back to 80 mg a.m. and 40 mg p.m.      Relevant Orders   EKG 12-Lead (Completed)   Hyperlipidemia associated with type 2 diabetes mellitus (HCC) (Chronic)   Paroxysmal Atrial fibrillation with RVR - peri-MI; recurrent after DCCV (Chronic)   Relevant Orders   EKG 12-Lead (Completed)   Essential hypertension (Chronic)    Blood pressure looks great on low-dose medications.  I am leery of pushing his active abduction further, because of concern for orthostatic dizziness with his diuretics.      Chronic respiratory failure with hypoxia and hypercapnia (HCC) (Chronic)    Now on 4 L oxygen, continues to get worse.  Need to maintain steady weights and avoid exacerbation of CHF.  Continue with pulmonary medicine.        ===================================  HPI:    TAHJAY [name] is a 65 y.o. adult  with a relatively complicated medical history including CAD (inferior STEMI October 2014 with ISCHEMIC CARDIOMYOPATHY, PAF and Severe Oxygen Dependent COPD (Gold 4) below who presents today for annual follow-up.   GEZ:MOQHUTML STEMI October 2014 very large RCA occluded, cardiac shock and atrial fibrillation requiring cardioversion. ? PCL very large BMS stent 5 x 24 mm postdilated to 5.63mm ? Moderate Ischemic Cardiomyopathy with EF of 40-45% -->improved to 55-60%-> chronic HFpEF  PAF - no recurrence in > 18 months. - On Eliquis & Bisoprolol.  COPD - on home O2 4L.  GARNET OVERFIELD was last seen on 02/20/2019 -> been evaluated with imaging studies  showing a possible pancreatic mass.  He himself is doing TID by standpoint.  No chest pain or pressure.  Not very active, especially COVID housebound.  Does not like cold weather.  No angina.  No symptoms of A. Fib.  Recent Hospitalizations:   03/12/2019: Admitted with pancreatic cyst/dilated bile duct > EGD/EUS => pancreas cyst not felt to be cancer.  Reviewed  CV studies:    The following studies were reviewed today: (if available, images/films reviewed: From Epic Chart or Care Everywhere) . None:  Interval History:   Benjamin [name] returns here today with his daughter stating that his breathing has gotten progressively worse.  His weight is gone up as well.  This Is more edema.  He is definitely increase his dose of Lasix to the point where he is not taking 80 mg in the morning and 40 at night.  His prescription is that that he can take up to 4 tablets a day.  Which would be 80 mg twice daily.    He denies any chest pain or pressure with rest or exertion, she simply has baseline dyspnea which is worse with any minimal activity.  He sleeps on a recliner, partly because of orthopnea partly simply because of baseline dyspnea.  Other than a panic attack nearly about a month ago where he woke up with orthopnea and significant tachycardia.  He did not say that he felt like A. fib.  He has not any symptoms of A. fib.  CV Review of Symptoms (Summary): positive for - dyspnea on exertion, edema, orthopnea, shortness of breath and 1 episode of anxiety with tachycardia, but otherwise no rapid irregular heartbeats or palpitations. negative for - chest pain, irregular heartbeat or Syncope or near syncope, TIA/amaurosis fugax or claudication.  Not walking enough to no claudication.   The patient does not have symptoms concerning for COVID-19 infection (fever, chills, cough, or new shortness of breath).   REVIEWED OF SYSTEMS   Review of Systems  Constitutional: Positive for malaise/fatigue (No energy).  Negative for weight loss (Actually gained weight-).  HENT: Negative for congestion and nosebleeds.   Respiratory: Positive for cough (Chronic.), shortness of breath (Getting progressively worse.) and wheezing. Negative for sputum production.   Cardiovascular: Positive for leg swelling (A little bit he is now taking 40 mg twice daily except last month been taking 80 mg in the morning and 40 in the evening.).  Gastrointestinal: Negative for abdominal pain, blood in stool, constipation and melena.  Genitourinary: Negative for hematuria.  Musculoskeletal: Positive for joint pain. Negative for back pain and falls.  Neurological: Positive for dizziness and weakness (Global). Negative for focal weakness.  Psychiatric/Behavioral: Positive for depression. Negative for memory loss. The patient is nervous/anxious (He had a full [place] attack about a month ago.  Heart was racing, more short of breath than usual.  Was  able to calm himself down, and did not have A. fib symptoms.). The patient does not have insomnia.     I have reviewed and (if needed) personally updated the patient's problem list, medications, allergies, past medical and surgical history, social and family history.   PAST MEDICAL HISTORY   Past Medical History:  Diagnosis Date  . Anxiety   . Asthma   . Atrial fibrillation with RVR - peri-MI; recurrent after DCCV 11/19/2012  . CAD S/P percutaneous coronary angioplasty  11/18/2012   - PCI mid RCA - 5.0 mm x 24 mm VeriFlex BMS (5.6 mm)  . Chronic combined systolic and diastolic heart failure, NYHA class 2 ([place]) 11/17/2012  . Colon polyp    Status post polypectomy x3.  [name] COPD (chronic obstructive pulmonary disease) ([place])   . Dyslipidemia- low HDL 11/19/2012  . Heart attack ([place])   . Heart disease   . Heartburn   . High cholesterol   . Hypertension   . Ischemic cardiomyopathy 11/19/2012   EF 40-45% with inferoseptal HK, Gr 2DD - high LVEDP/LAP. --> confirmed by f/u Echo 03/2013.  [name]  STEMI of inferior wall 11/18/12- RCA BMS 11/17/2012   Cardiogenic shock - post MI with RV infarction [785.51]  . Stomach ulcer   . Tobacco abuse    40 Pk-yr (1- 1 1/2 PPD) --> Quit 11/18/2012 when presented with STEMI    PAST SURGICAL HISTORY   Past Surgical History:  Procedure Laterality Date  . COLONOSCOPY N/A 11/20/2013   Procedure: COLONOSCOPY;  Surgeon: Jamesetta So, MD;  [facility];  Service: Gastroenterology;  Laterality: N/A;  . CORONARY ANGIOPLASTY WITH STENT PLACEMENT Right 11/17/12   Mid RCA aspiration thrombectomy and PCI with VeriFlex BMS 5.0 mm x 24 mm (5.6 mm; also PTCA of distal RPL 4 distal embolization.  . Dental extractions    . ESOPHAGOGASTRODUODENOSCOPY (EGD) WITH PROPOFOL N/A 03/12/2019   Procedure: ESOPHAGOGASTRODUODENOSCOPY (EGD) WITH PROPOFOL;  Surgeon: Milus Banister, MD;  Location: [facility];  Service: Endoscopy;  Laterality: N/A;  . EUS N/A 03/12/2019   Procedure: UPPER ENDOSCOPIC ULTRASOUND (EUS) RADIAL;  Surgeon: Milus Banister, MD;  Location: [facility];  Service: Endoscopy;  Laterality: N/A;  . FINE NEEDLE ASPIRATION N/A 03/12/2019   Procedure: FINE NEEDLE ASPIRATION (FNA) LINEAR;  Surgeon: Milus Banister, MD;  Location: [facility];  Service: Endoscopy;  Laterality: N/A;  . HERNIA REPAIR  1997, 2010?  [name] LEFT HEART CATHETERIZATION WITH CORONARY ANGIOGRAM N/A 11/17/2012   Procedure: LEFT HEART CATHETERIZATION WITH CORONARY ANGIOGRAM;  Surgeon: Leonie Man, MD;  [facility] CATH LAB: for Inferior STEMI -> EF 50-55%-mod basal -apical inferior HK. (LCA- Normal) LM -very large -->Tortuous, wraparound LAD -> Large bifurcating D1. Cx -large, no-dom - large LatOM1, small AVG &2 small OMs.  RCA (Very Large) - m90% subtototal -> RPAV(large PL2) & rPDA mild distal embolization  . TOE SURGERY     great toe  . TONSILLECTOMY    . TRANSTHORACIC ECHOCARDIOGRAM  11/18/2012   Normal LV size, moderate reduced function 40-45% with severe HK-AK of the  inferoseptal wall with incoordinate septal motion.  Domingo Dimes ECHOCARDIOGRAM  March 15   Moderately reduced LVEF of 40 at 45% with inferoseptal hypokinesis and high filling pressures.  . TRANSTHORACIC ECHOCARDIOGRAM  04/2015   EF 55-60%.; Pericardial fat pad versus effusion; no tamponade    Immunization History  Administered Date(s) Administered  . Influenza,inj,Quad PF,6+ Mos 10/29/2017  . Moderna Sars-Covid-2 Vaccination 04/29/2019, 05/25/2019  MEDICATIONS/ALLERGIES   Current Meds  Medication Sig  . acetaminophen (TYLENOL) 500 MG tablet Take 1,000 mg by mouth every 6 (six) hours as needed for moderate pain or headache.   . albuterol (PROVENTIL HFA;VENTOLIN HFA) 108 (90 BASE) MCG/ACT inhaler Inhale 2 puffs into the lungs every 6 (six) hours as needed for wheezing.  [name] alum & mag hydroxide-simeth (MAALOX/MYLANTA) 200-200-20 MG/5ML suspension Take 15 mLs by mouth every 6 (six) hours as needed for indigestion or heartburn.  . cetirizine (ZYRTEC) 10 MG tablet Take 10 mg by mouth daily as needed for allergies.  . FEROSUL 325 (65 Fe) MG tablet Take 325 mg by mouth 3 (three) times daily.  . Fluticasone-Umeclidin-Vilant (TRELEGY ELLIPTA) 100-62.5-25 MCG/INH AEPB Inhale 1 puff into the lungs daily.  . furosemide (LASIX) 40 MG tablet TAKE ONE TO TWO TABLETS BY MOUTH EVERY MORNING AS NEEDED AND TAKE 1 TABLET BY MOUTH EVERY EVENING  . OXYGEN-HELIUM IN Inhale into the lungs. Using 4 liters nasal cannula ,continuous  . pantoprazole (PROTONIX) 40 MG tablet Take 40 mg by mouth daily.  . potassium chloride (KLOR-CON) 10 MEQ tablet TAKE 1 TABLET BY MOUTH EVERY DAY  . rosuvastatin (CRESTOR) 40 MG tablet TAKE 1 TABLET BY MOUTH EVERY DAY  . sodium chloride (OCEAN) 0.65 % SOLN nasal spray Place 1 spray into both nostrils as needed for congestion.  . [DISCONTINUED] apixaban (ELIQUIS) 5 MG TABS tablet Take 1 tablet (5 mg total) by mouth 2 (two) times daily. LABS AND AN APPOINTMENT WITH CARDIOLOGIST  NEEDED FOR FURTHER REFILLS  . [DISCONTINUED] bisoprolol (ZEBETA) 5 MG tablet TAKE 1/2 TABLET BY MOUTH EVERY DAY  . [DISCONTINUED] losartan (COZAAR) 50 MG tablet TAKE 1 TABLET BY MOUTH EVERY DAY  . [DISCONTINUED] nitroGLYCERIN (NITROSTAT) 0.4 MG SL tablet Place 1 tablet (0.4 mg total) under the tongue every 5 (five) minutes x 3 doses as needed for chest pain.    Allergies  Allergen Reactions  . Codeine     Red blotches  . Penicillins     Unknown reaction Did it involve swelling of the face/tongue/throat, SOB, or low BP? Unknown Did it involve sudden or severe rash/hives, skin peeling, or any reaction on the inside of your mouth or nose? Unknown Did you need to seek medical attention at a hospital or doctor's office? Unknown When did it last happen?childhood allergy If all above answers are "NO", may proceed with cephalosporin use.     SOCIAL HISTORY/FAMILY HISTORY   Reviewed in Epic:  Pertinent findings:  Social History   Tobacco Use  . Smoking status: Former Smoker    Packs/day: 2.00    Years: 40.00    Pack years: 80.00    Quit date: 03/02/2012    Years since quitting: 8.0  . Smokeless tobacco: Never Used  . Tobacco comment: [place] [facility] 10/2102  Substance Use Topics  . Alcohol use: No  . Drug use: No   Social History   Social History Narrative   Divorced father of 2 (one daughter one son).   Lives alone, but his daughter who lives nearby is "watching him like a Engineer, drilling ".   He quit smoking during his hospitalization after a 40-pack-year smoking history.      Does not drink alcohol.      Essentially unable to do much of any walking.  Is on home oxygen/oxygen concentrator.  Sleeps in a recliner.  Not exercising.    OBJCTIVE -PE, EKG, labs   Wt Readings from Last 3  Encounters:  03/11/20 233 lb (105.7 kg)  06/04/19 235 lb (106.6 kg)  03/03/19 225 lb (102.1 kg)    Physical Exam: BP 120/72   Pulse 69   Ht 5\' 11"  (1.803 m)   Wt 233 lb (105.7  kg)   SpO2 96%   BMI 32.50 kg/m  Physical Exam Vitals reviewed.  Constitutional:      General: She is not in acute distress.    Appearance: She is obese. She is ill-appearing. She is not toxic-appearing.     Comments: Chronically ill-appearing mildly obese gentleman return to obesity.  Sits tripod of the examining table with oxygen concentrator.  HENT:     Head: Normocephalic and atraumatic.  Eyes:     General: No scleral icterus.    Extraocular Movements: Extraocular movements intact.     Pupils: Pupils are equal, round, and reactive to light.  Neck:     Vascular: No carotid bruit, hepatojugular reflux (Unable to assess) or JVD.  Cardiovascular:     Rate and Rhythm: Normal rate and regular rhythm. Occasional extrasystoles are present.    Chest Wall: PMI is not displaced (But mildly sustained).     Pulses: Decreased pulses (Decreased but palpable.).     Heart sounds: S1 normal and S2 normal. Heart sounds are distant. No murmur heard. No friction rub. No gallop.   Pulmonary:     Effort: Accessory muscle usage and prolonged expiration present. No respiratory distress.     Breath sounds: Decreased air movement present. Examination of the right-lower field reveals decreased breath sounds. Examination of the left-lower field reveals decreased breath sounds. Decreased breath sounds (Throughout) and wheezing (Diffuse, faint end expiratory) present.  Abdominal:     General: Bowel sounds are normal. There is no distension (Protuberant abdomen. ).     Palpations: Abdomen is soft. There is no mass.     Tenderness: There is no abdominal tenderness.     Comments:  No obvious HSM.  Musculoskeletal:        General: Swelling (Trivial) present. Normal range of motion.     Cervical back: Normal range of motion and neck supple.  Skin:    General: Skin is warm and dry.     Coloration: Skin is pale.  Neurological:     General: No focal deficit present.     Mental Status: She is alert and oriented  to person, place, and time.     Motor: No weakness.  Psychiatric:        Behavior: Behavior normal.        Thought Content: Thought content normal.        Judgment: Judgment normal.     Comments: Still somewhat depressed mood.     Adult ECG Report  Rate: 69 ;  Rhythm: normal sinus rhythm and Borderline low voltage.  Inferior MI, age-indeterminate.  Cannot rule out anterior margin age-indeterminate.;   Narrative Interpretation: Stable  Recent Labs: Labs followed by PCP.  Lipids will be checked in May.  06/04/2019: TC 137, TG 140, HDL 54 (last LDL was from 2020-55); TSH 1.9.  02/03/2020 Hgb 10.9, K+ 4.4. Lab Results  Component Value Date   CHOL 154 06/27/2016   HDL 71 06/27/2016   LDLCALC 61 06/27/2016   TRIG 109 06/27/2016   CHOLHDL 2.2 06/27/2016   Lab Results  Component Value Date   CREATININE 1.00 01/21/2019   BUN 22 06/27/2016   NA 138 06/27/2016   K 4.0 06/27/2016   CL 96 (L) 06/27/2016  CO2 32 (H) 06/27/2016   CBC Latest Ref Rng & Units 03/03/2019 06/03/2015 06/02/2015  WBC 3.8 - 10.8 Thousand/uL 8.8 14.0(H) 22.2(H)  Hemoglobin 13.2 - 17.1 g/dL 12.7(L) 11.1(L) 10.8(L)  Hematocrit 38.5 - 50.0 % 38.6 37.9(L) 36.3(L)  Platelets 140 - 400 Thousand/uL 338 352 416(H)    Lab Results  Component Value Date   TSH 2.773 07/14/2013    ==================================================  COVID-19 Education: The signs and symptoms of COVID-19 were discussed with the patient and how to seek care for testing (follow up with PCP or arrange E-visit).   The importance of social distancing and COVID-19 vaccination was discussed today. The patient is practicing social distancing & Masking.   I spent a total of 46minutes with the patient spent in direct patient consultation.  Additional time spent with chart review  / charting (studies, outside notes, etc): 12 min Total Time: 45 min   Current medicines are reviewed at length with the patient today.  (+/- concerns) no medication  issues, Is wondering if maybe he needs a different pulmonologist.  This visit occurred during the SARS-CoV-2 public health emergency.  Safety protocols were in place, including screening questions prior to the visit, additional usage of staff PPE, and extensive cleaning of exam room while observing appropriate contact time as indicated for disinfecting solutions.  Notice: This dictation was prepared with Dragon dictation along with smaller phrase technology. Any transcriptional errors that result from this process are unintentional and may not be corrected upon review.  Patient Instructions / Medication Changes & Studies & Tests Ordered   Patient Instructions  Medication Instructions:  No changes   *If you need a refill on your cardiac medications before your next appointment, please call your pharmacy*   Lab Work: Not needed If you have labs (blood work) drawn today and your tests are completely normal, you will receive your results only by: [name] MyChart Message (if you have MyChart) OR . A paper copy in the mail If you have any lab test that is abnormal or we need to change your treatment, we will call you to review the results.   Testing/Procedures:  Not needed  Follow-Up: At [facility], you and your health needs are our priority.  As part of our continuing mission to provide you with exceptional heart care, we have created designated Provider Care Teams.  These Care Teams include your primary Cardiologist (physician) and Advanced Practice Providers (APPs -  Physician Assistants and Nurse Practitioners) who all work together to provide you with the care you need, when you need it.     Your next appointment:   7 month(s)  Sept 2022  The format for your next appointment:   In Person  Provider:   Glenetta Hew, MD   Other Instructions  let Dr Ellyn Hack know if you need a pulmonologist referral   Studies Ordered:   Orders Placed This Encounter  Procedures  . EKG  12-Lead     Glenetta Hew, M.D., M.S. Interventional Cardiologist   Pager # [phone] Phone # [phone] [address] [place], [place] [address]   Thank you for choosing Heartcare at [facility]!!

## 2020-03-11 NOTE — Patient Instructions (Addendum)
Medication Instructions:  No changes   *If you need a refill on your cardiac medications before your next appointment, please call your pharmacy*   Lab Work: Not needed If you have labs (blood work) drawn today and your tests are completely normal, you will receive your results only by: [name] MyChart Message (if you have MyChart) OR . A paper copy in the mail If you have any lab test that is abnormal or we need to change your treatment, we will call you to review the results.   Testing/Procedures:  Not needed  Follow-Up: At [facility], you and your health needs are our priority.  As part of our continuing mission to provide you with exceptional heart care, we have created designated Provider Care Teams.  These Care Teams include your primary Cardiologist (physician) and Advanced Practice Providers (APPs -  Physician Assistants and Nurse Practitioners) who all work together to provide you with the care you need, when you need it.     Your next appointment:   7 month(s)  Sept 2022  The format for your next appointment:   In Person  Provider:   Glenetta Hew, MD   Other Instructions  let Dr Ellyn Hack know if you need a pulmonologist referral

## 2020-03-14 ENCOUNTER — Telehealth: Payer: Self-pay | Admitting: Cardiology

## 2020-03-14 NOTE — Telephone Encounter (Signed)
*  STAT* If patient is at the pharmacy, call can be transferred to refill team.   1. Which medications need to be refilled? (please list name of each medication and dose if known) Nitroglycerin  2. Which pharmacy/location (including street and city if local pharmacy) is medication to be sent to? [facility]  3. Do they need a 30 day or 90 day supply? Not sure

## 2020-03-14 NOTE — Telephone Encounter (Signed)
This is Dr. Harding's pt. °

## 2020-03-15 MED ORDER — NITROGLYCERIN 0.4 MG SL SUBL: 0.4000 mg | SUBLINGUAL_TABLET | SUBLINGUAL | 2 refills | Status: DC | PRN

## 2020-03-15 NOTE — Addendum Note (Signed)
Addended by: Crissie Reese on: 03/15/2020 08:45 AM   Modules accepted: Orders

## 2020-03-17 DIAGNOSIS — J449 Chronic obstructive pulmonary disease, unspecified: Secondary | ICD-10-CM | POA: Diagnosis not present

## 2020-03-22 ENCOUNTER — Other Ambulatory Visit: Payer: Self-pay | Admitting: Cardiology

## 2020-03-22 NOTE — Telephone Encounter (Signed)
eliquis refilled

## 2020-04-03 ENCOUNTER — Encounter: Payer: Self-pay | Admitting: Cardiology

## 2020-04-03 NOTE — Progress Notes (Incomplete)
Primary Care Provider: Redmond School, MD Cardiologist: Glenetta Hew, MD Electrophysiologist: None  Clinic Note: Chief Complaint  Patient presents with  . Follow-up    Annual.  Breathing getting progressively worse.  . Coronary Artery Disease    No angina  . Edema    Has been on coming awake, and taking increased dose of Lasix.   ===================================  ASSESSMENT/PLAN   Problem List Items Addressed This Visit    Type 2 diabetes mellitus with hyperglycemia, without long-term current use of insulin (Benjamin [name])   STEMI of inferior wall 11/18/12- RCA BMS - Primary (Chronic)   Relevant Orders   EKG 12-Lead (Completed)   COPD GOLD IV/ 02 dep  (Chronic)   CAD S/P percutaneous coronary angioplasty - PCI mid RCA - 5.0 mm x 24 mm VeriFlex BMS (5.6 mm) (Chronic)   Relevant Orders   EKG 12-Lead (Completed)   Cardiomyopathy, ischemic - EF 40-45%; now up to 55-60% (Chronic)   Relevant Orders   EKG 12-Lead (Completed)   Chronic diastolic HF (heart failure), NYHA class 2 (HCC) (Chronic)   Relevant Orders   EKG 12-Lead (Completed)   Hyperlipidemia associated with type 2 diabetes mellitus (HCC) (Chronic)   Paroxysmal Atrial fibrillation with RVR - peri-MI; recurrent after DCCV (Chronic)   Relevant Orders   EKG 12-Lead (Completed)   Essential hypertension (Chronic)   Chronic respiratory failure with hypoxia and hypercapnia (HCC) (Chronic)     ===================================  HPI:    Benjamin [name] is a 65 y.o. adult with a relatively complicated medical history including CAD (inferior STEMI October 2014 with ISCHEMIC CARDIOMYOPATHY, PAF and Severe Oxygen Dependent COPD (Gold 4) below who presents today for annual follow-up.   [id number] STEMI October 2014 very large RCA occluded, cardiac shock and atrial fibrillation requiring cardioversion. ? PCL very large BMS stent 5 x 24 mm postdilated to 5.37mm ? Moderate Ischemic Cardiomyopathy with EF of 40-45%  -->improved to 55-60%-> chronic HFpEF  PAF - no recurrence in > 18 months. - On Eliquis & Bisoprolol.  COPD - on home O2 4L.  Benjamin [name] was last seen on 02/20/2019 -> been evaluated with imaging studies showing a possible pancreatic mass.  He himself is doing TID by standpoint.  No chest pain or pressure.  Not very active, especially COVID housebound.  Does not like cold weather.  No angina.  No symptoms of A. Fib.  Recent Hospitalizations:   03/12/2019: Admitted with pancreatic cyst/dilated bile duct > EGD/EUS => pancreas cyst not felt to be cancer.  Reviewed  CV studies:    The following studies were reviewed today: (if available, images/films reviewed: From Epic Chart or Care Everywhere) . None:  Interval History:   Benjamin [name] returns here today with his daughter stating that his breathing has gotten progressively worse.  His weight is gone up as well.  This Is more edema.  He is definitely increase his dose of Lasix to the point where he is not taking 80 mg in the morning and 40 at night.  His prescription is that that he can take up to 4 tablets a day.  Which would be 80 mg twice daily.    He denies any chest pain or pressure with rest or exertion, she simply has baseline dyspnea which is worse with any minimal activity.  He sleeps on a recliner, partly because of orthopnea partly simply because of baseline dyspnea.  Other than a panic attack nearly about a month ago where he woke up  with orthopnea and significant tachycardia.  He did not say that he felt like A. fib.  He has not any symptoms of A. fib.  CV Review of Symptoms (Summary): positive for - dyspnea on exertion, edema, orthopnea, shortness of breath and 1 episode of anxiety with tachycardia, but otherwise no rapid irregular heartbeats or palpitations. negative for - chest pain, irregular heartbeat or Syncope or near syncope, TIA/amaurosis fugax or claudication.  Not walking enough to no claudication.   The  patient does not have symptoms concerning for COVID-19 infection (fever, chills, cough, or new shortness of breath).   REVIEWED OF SYSTEMS   Review of Systems  Constitutional: Positive for malaise/fatigue (No energy). Negative for weight loss (Actually gained weight-).  HENT: Negative for congestion and nosebleeds.   Respiratory: Positive for cough (Chronic.), shortness of breath (Getting progressively worse.) and wheezing. Negative for sputum production.   Cardiovascular: Positive for leg swelling (A little bit he is now taking 40 mg twice daily except last month been taking 80 mg in the morning and 40 in the evening.).  Gastrointestinal: Negative for abdominal pain, blood in stool, constipation and melena.  Genitourinary: Negative for hematuria.  Musculoskeletal: Positive for joint pain. Negative for back pain and falls.  Neurological: Positive for dizziness and weakness (Global). Negative for focal weakness.  Psychiatric/Behavioral: Positive for depression. Negative for memory loss. The patient is nervous/anxious (He had a full [place] attack about a month ago.  Heart was racing, more short of breath than usual.  Was able to calm himself down, and did not have A. fib symptoms.). The patient does not have insomnia.     I have reviewed and (if needed) personally updated the patient's problem list, medications, allergies, past medical and surgical history, social and family history.   PAST MEDICAL HISTORY   Past Medical History:  Diagnosis Date  . Anxiety   . Asthma   . Atrial fibrillation with RVR - peri-MI; recurrent after DCCV 11/19/2012  . CAD S/P percutaneous coronary angioplasty  11/18/2012   - PCI mid RCA - 5.0 mm x 24 mm VeriFlex BMS (5.6 mm)  . Chronic combined systolic and diastolic heart failure, NYHA class 2 ([place]) 11/17/2012  . Colon polyp    Status post polypectomy x3.  [name] COPD (chronic obstructive pulmonary disease) ([place])   . Dyslipidemia- low HDL 11/19/2012  . Heart  attack ([place])   . Heart disease   . Heartburn   . High cholesterol   . Hypertension   . Ischemic cardiomyopathy 11/19/2012   EF 40-45% with inferoseptal HK, Gr 2DD - high LVEDP/LAP. --> confirmed by f/u Echo 03/2013.  [name] STEMI of inferior wall 11/18/12- RCA BMS 11/17/2012   Cardiogenic shock - post MI with RV infarction [785.51]  . Stomach ulcer   . Tobacco abuse    40 Pk-yr (1- 1 1/2 PPD) --> Quit 11/18/2012 when presented with STEMI    PAST SURGICAL HISTORY   Past Surgical History:  Procedure Laterality Date  . COLONOSCOPY N/A 11/20/2013   Procedure: COLONOSCOPY;  Surgeon: Jamesetta So, MD;  [facility];  Service: Gastroenterology;  Laterality: N/A;  . CORONARY ANGIOPLASTY WITH STENT PLACEMENT Right 11/17/12   Mid RCA aspiration thrombectomy and PCI with VeriFlex BMS 5.0 mm x 24 mm (5.6 mm; also PTCA of distal RPL 4 distal embolization.  . Dental extractions    . ESOPHAGOGASTRODUODENOSCOPY (EGD) WITH PROPOFOL N/A 03/12/2019   Procedure: ESOPHAGOGASTRODUODENOSCOPY (EGD) WITH PROPOFOL;  Surgeon: Milus Banister, MD;  Location: [facility];  Service: Endoscopy;  Laterality: N/A;  . EUS N/A 03/12/2019   Procedure: UPPER ENDOSCOPIC ULTRASOUND (EUS) RADIAL;  Surgeon: Milus Banister, MD;  Location: [facility];  Service: Endoscopy;  Laterality: N/A;  . FINE NEEDLE ASPIRATION N/A 03/12/2019   Procedure: FINE NEEDLE ASPIRATION (FNA) LINEAR;  Surgeon: Milus Banister, MD;  Location: [facility];  Service: Endoscopy;  Laterality: N/A;  . HERNIA REPAIR  1997, 2010?  [name] LEFT HEART CATHETERIZATION WITH CORONARY ANGIOGRAM N/A 11/17/2012   Procedure: LEFT HEART CATHETERIZATION WITH CORONARY ANGIOGRAM;  Surgeon: Leonie Man, MD;  [facility] CATH LAB: for Inferior STEMI -> EF 50-55%-mod basal -apical inferior HK. (LCA- Normal) LM -very large -->Tortuous, wraparound LAD -> Large bifurcating D1. Cx -large, no-dom - large LatOM1, small AVG &2 small OMs.  RCA (Very Large) - m90% subtototal ->  RPAV(large PL2) & rPDA mild distal embolization  . TOE SURGERY     great toe  . TONSILLECTOMY    . TRANSTHORACIC ECHOCARDIOGRAM  11/18/2012   Normal LV size, moderate reduced function 40-45% with severe HK-AK of the inferoseptal wall with incoordinate septal motion.  Domingo Dimes ECHOCARDIOGRAM  March 15   Moderately reduced LVEF of 40 at 45% with inferoseptal hypokinesis and high filling pressures.  . TRANSTHORACIC ECHOCARDIOGRAM  04/2015   EF 55-60%.; Pericardial fat pad versus effusion; no tamponade    Immunization History  Administered Date(s) Administered  . Influenza,inj,Quad PF,6+ Mos 10/29/2017  . Moderna Sars-Covid-2 Vaccination 04/29/2019, 05/25/2019    MEDICATIONS/ALLERGIES   Current Meds  Medication Sig  . acetaminophen (TYLENOL) 500 MG tablet Take 1,000 mg by mouth every 6 (six) hours as needed for moderate pain or headache.   . albuterol (PROVENTIL HFA;VENTOLIN HFA) 108 (90 BASE) MCG/ACT inhaler Inhale 2 puffs into the lungs every 6 (six) hours as needed for wheezing.  [name] alum & mag hydroxide-simeth (MAALOX/MYLANTA) 200-200-20 MG/5ML suspension Take 15 mLs by mouth every 6 (six) hours as needed for indigestion or heartburn.  . cetirizine (ZYRTEC) 10 MG tablet Take 10 mg by mouth daily as needed for allergies.  . FEROSUL 325 (65 Fe) MG tablet Take 325 mg by mouth 3 (three) times daily.  . Fluticasone-Umeclidin-Vilant (TRELEGY ELLIPTA) 100-62.5-25 MCG/INH AEPB Inhale 1 puff into the lungs daily.  . furosemide (LASIX) 40 MG tablet TAKE ONE TO TWO TABLETS BY MOUTH EVERY MORNING AS NEEDED AND TAKE 1 TABLET BY MOUTH EVERY EVENING  . OXYGEN-HELIUM IN Inhale into the lungs. Using 4 liters nasal cannula ,continuous  . pantoprazole (PROTONIX) 40 MG tablet Take 40 mg by mouth daily.  . potassium chloride (KLOR-CON) 10 MEQ tablet TAKE 1 TABLET BY MOUTH EVERY DAY  . rosuvastatin (CRESTOR) 40 MG tablet TAKE 1 TABLET BY MOUTH EVERY DAY  . sodium chloride (OCEAN) 0.65 % SOLN nasal  spray Place 1 spray into both nostrils as needed for congestion.  . [DISCONTINUED] apixaban (ELIQUIS) 5 MG TABS tablet Take 1 tablet (5 mg total) by mouth 2 (two) times daily. LABS AND AN APPOINTMENT WITH CARDIOLOGIST NEEDED FOR FURTHER REFILLS  . [DISCONTINUED] bisoprolol (ZEBETA) 5 MG tablet TAKE 1/2 TABLET BY MOUTH EVERY DAY  . [DISCONTINUED] losartan (COZAAR) 50 MG tablet TAKE 1 TABLET BY MOUTH EVERY DAY  . [DISCONTINUED] nitroGLYCERIN (NITROSTAT) 0.4 MG SL tablet Place 1 tablet (0.4 mg total) under the tongue every 5 (five) minutes x 3 doses as needed for chest pain.    Allergies  Allergen Reactions  . Codeine     Red  blotches  . Penicillins     Unknown reaction Did it involve swelling of the face/tongue/throat, SOB, or low BP? Unknown Did it involve sudden or severe rash/hives, skin peeling, or any reaction on the inside of your mouth or nose? Unknown Did you need to seek medical attention at a hospital or doctor's office? Unknown When did it last happen?childhood allergy If all above answers are "NO", may proceed with cephalosporin use.     SOCIAL HISTORY/FAMILY HISTORY   Reviewed in Epic:  Pertinent findings:  Social History   Tobacco Use  . Smoking status: Former Smoker    Packs/day: 2.00    Years: 40.00    Pack years: 80.00    Quit date: 03/02/2012    Years since quitting: 8.0  . Smokeless tobacco: Never Used  . Tobacco comment: [place] 10/2102  Substance Use Topics  . Alcohol use: No  . Drug use: No   Social History   Social History Narrative   Divorced father of 2 (one daughter one son).   Lives alone, but his daughter who lives nearby is "watching him like a Engineer, drilling ".   He quit smoking during his hospitalization after a 40-pack-year smoking history.      Does not drink alcohol.      Essentially unable to do much of any walking.  Is on home oxygen/oxygen concentrator.  Sleeps in a recliner.  Not exercising.    OBJCTIVE -PE, EKG,  labs   Wt Readings from Last 3 Encounters:  03/11/20 233 lb (105.7 kg)  06/04/19 235 lb (106.6 kg)  03/03/19 225 lb (102.1 kg)    Physical Exam: BP 120/72   Pulse 69   Ht 5\' 11"  (1.803 m)   Wt 233 lb (105.7 kg)   SpO2 96%   BMI 32.50 kg/m  Physical Exam Vitals reviewed.  Constitutional:      General: She is not in acute distress.    Appearance: She is obese. She is ill-appearing. She is not toxic-appearing.     Comments: Chronically ill-appearing mildly obese gentleman return to obesity.  Sits tripod of the examining table with oxygen concentrator.  HENT:     Head: Normocephalic and atraumatic.  Eyes:     General: No scleral icterus.    Extraocular Movements: Extraocular movements intact.     Pupils: Pupils are equal, round, and reactive to light.  Neck:     Vascular: No carotid bruit, hepatojugular reflux (Unable to assess) or JVD.  Cardiovascular:     Rate and Rhythm: Normal rate and regular rhythm. Occasional extrasystoles are present.    Chest Wall: PMI is not displaced (But mildly sustained).     Pulses: Decreased pulses (Decreased but palpable.).     Heart sounds: S1 normal and S2 normal. Heart sounds are distant. No murmur heard. No friction rub. No gallop.   Pulmonary:     Effort: Accessory muscle usage and prolonged expiration present. No respiratory distress.     Breath sounds: Decreased air movement present. Examination of the right-lower field reveals decreased breath sounds. Examination of the left-lower field reveals decreased breath sounds. Decreased breath sounds (Throughout) and wheezing (Diffuse, faint end expiratory) present.  Abdominal:     General: Bowel sounds are normal. There is no distension (Protuberant abdomen. ).     Palpations: Abdomen is soft. There is no mass.     Tenderness: There is no abdominal tenderness.     Comments:  No obvious HSM.  Musculoskeletal:  General: Swelling (Trivial) present. Normal range of motion.     Cervical  back: Normal range of motion and neck supple.  Skin:    General: Skin is warm and dry.     Coloration: Skin is pale.  Neurological:     General: No focal deficit present.     Mental Status: She is alert and oriented to person, place, and time.     Motor: No weakness.  Psychiatric:        Behavior: Behavior normal.        Thought Content: Thought content normal.        Judgment: Judgment normal.     Comments: Still somewhat depressed mood.     Adult ECG Report  Rate: 69 ;  Rhythm: normal sinus rhythm and Borderline low voltage.  Inferior MI, age-indeterminate.  Cannot rule out anterior margin age-indeterminate.;   Narrative Interpretation: Stable  Recent Labs: Labs followed by PCP.  Lipids will be checked in May.  06/04/2019: TC 137, TG 140, HDL 54 (last LDL was from 2020-55); TSH 1.9.  02/03/2020 Hgb 10.9, K+ 4.4. Lab Results  Component Value Date   CHOL 154 06/27/2016   HDL 71 06/27/2016   LDLCALC 61 06/27/2016   TRIG 109 06/27/2016   CHOLHDL 2.2 06/27/2016   Lab Results  Component Value Date   CREATININE 1.00 01/21/2019   BUN 22 06/27/2016   NA 138 06/27/2016   K 4.0 06/27/2016   CL 96 (L) 06/27/2016   CO2 32 (H) 06/27/2016   CBC Latest Ref Rng & Units 03/03/2019 06/03/2015 06/02/2015  WBC 3.8 - 10.8 Thousand/uL 8.8 14.0(H) 22.2(H)  Hemoglobin 13.2 - 17.1 g/dL 12.7(L) 11.1(L) 10.8(L)  Hematocrit 38.5 - 50.0 % 38.6 37.9(L) 36.3(L)  Platelets 140 - 400 Thousand/uL 338 352 416(H)    Lab Results  Component Value Date   TSH 2.773 07/14/2013    ==================================================  COVID-19 Education: The signs and symptoms of COVID-19 were discussed with the patient and how to seek care for testing (follow up with PCP or arrange E-visit).   The importance of social distancing and COVID-19 vaccination was discussed today. The patient is practicing social distancing & Masking.   I spent a total of 54minutes with the patient spent in direct patient  consultation.  Additional time spent with chart review  / charting (studies, outside notes, etc): 12 min Total Time: 45 min   Current medicines are reviewed at length with the patient today.  (+/- concerns) no medication issues, Is wondering if maybe he needs a different pulmonologist.  This visit occurred during the SARS-CoV-2 public health emergency.  Safety protocols were in place, including screening questions prior to the visit, additional usage of staff PPE, and extensive cleaning of exam room while observing appropriate contact time as indicated for disinfecting solutions.  Notice: This dictation was prepared with Dragon dictation along with smaller phrase technology. Any transcriptional errors that result from this process are unintentional and may not be corrected upon review.  Patient Instructions / Medication Changes & Studies & Tests Ordered   Patient Instructions  Medication Instructions:  No changes   *If you need a refill on your cardiac medications before your next appointment, please call your pharmacy*   Lab Work: Not needed If you have labs (blood work) drawn today and your tests are completely normal, you will receive your results only by: [name] MyChart Message (if you have MyChart) OR . A paper copy in the mail If you have any lab  test that is abnormal or we need to change your treatment, we will call you to review the results.   Testing/Procedures:  Not needed  Follow-Up: At [facility], you and your health needs are our priority.  As part of our continuing mission to provide you with exceptional heart care, we have created designated Provider Care Teams.  These Care Teams include your primary Cardiologist (physician) and Advanced Practice Providers (APPs -  Physician Assistants and Nurse Practitioners) who all work together to provide you with the care you need, when you need it.     Your next appointment:   7 month(s)  Sept 2022  The format for your  next appointment:   In Person  Provider:   Glenetta Hew, MD   Other Instructions  let Dr Ellyn Hack know if you need a pulmonologist referral   Studies Ordered:   Orders Placed This Encounter  Procedures  . EKG 12-Lead     Glenetta Hew, M.D., M.S. Interventional Cardiologist   Pager # [phone] Phone # [phone] [address]. [place], [place] [address]   Thank you for choosing Heart[facility] at [facility]!!

## 2020-04-04 ENCOUNTER — Encounter: Payer: Self-pay | Admitting: Cardiology

## 2020-04-04 NOTE — Assessment & Plan Note (Signed)
Very large caliber RCA treated with a bare-metal stent dilated up to 5.56 mm in diameter.  He had otherwise diffuse residual disease.  He has not had any real anginal symptoms since his MI.  More troubled by dyspnea or COPD.  Plan:  On bisoprolol for beta-1 selectivity-2.5 mg, 50 mg losartan  On stable dose of rosuvastatin doing pretty well controlled lipids based on previous labs.  No longer on Plavix as he is on apixaban.  Not on aspirin either.  No change medications.

## 2020-04-04 NOTE — Assessment & Plan Note (Signed)
Blood pressure looks great on low-dose medications.  I am leery of pushing his active abduction further, because of concern for orthostatic dizziness with his diuretics.

## 2020-04-04 NOTE — Assessment & Plan Note (Signed)
7-1/2 years out from his MI.  He had a pretty sizable infarct.  EF returned back to normal following PCI, and he really only has diastolic heart failure and potentially cor pulmonale type symptoms of heart failure.  No recurrent angina symptoms.

## 2020-04-04 NOTE — Assessment & Plan Note (Signed)
Has been followed by Dr. Melvyn Novas is debating whether or not he would like to have a second opinion.  Currently on Trelegy, home oxygen

## 2020-04-04 NOTE — Assessment & Plan Note (Signed)
Now on 4 L oxygen, continues to get worse.  Need to maintain steady weights and avoid exacerbation of CHF.  Continue with pulmonary medicine.

## 2020-04-04 NOTE — Assessment & Plan Note (Signed)
He has a combination of NYHA class II CHF symptoms but more significant COPD symptoms.  He is definitely putting on fluid weight, but I think he also has probably some just general body weight because of lack of exercise.  History limited by COPD.  He is on low-dose beta-blocker and ARB standing dose Lasix.   He is adjusting his Lasix dose based on his weights.  I told him probably needs to take 80 mg twice daily for about a week and then go back to 80 mg a.m. and 40 mg p.m.

## 2020-04-14 DIAGNOSIS — J449 Chronic obstructive pulmonary disease, unspecified: Secondary | ICD-10-CM | POA: Diagnosis not present

## 2020-04-20 ENCOUNTER — Other Ambulatory Visit: Payer: Self-pay | Admitting: Cardiology

## 2020-04-27 DIAGNOSIS — I5032 Chronic diastolic (congestive) heart failure: Secondary | ICD-10-CM | POA: Diagnosis not present

## 2020-04-27 DIAGNOSIS — I110 Hypertensive heart disease with heart failure: Secondary | ICD-10-CM | POA: Diagnosis not present

## 2020-04-27 DIAGNOSIS — I480 Paroxysmal atrial fibrillation: Secondary | ICD-10-CM | POA: Diagnosis not present

## 2020-04-27 DIAGNOSIS — J449 Chronic obstructive pulmonary disease, unspecified: Secondary | ICD-10-CM | POA: Diagnosis not present

## 2020-04-27 DIAGNOSIS — Z87891 Personal history of nicotine dependence: Secondary | ICD-10-CM | POA: Diagnosis not present

## 2020-05-15 DIAGNOSIS — J449 Chronic obstructive pulmonary disease, unspecified: Secondary | ICD-10-CM | POA: Diagnosis not present

## 2020-05-23 ENCOUNTER — Other Ambulatory Visit: Payer: Self-pay | Admitting: Cardiology

## 2020-05-23 NOTE — Telephone Encounter (Signed)
59m, 105.7kg, Creatinine, Serum 0.810 MG/ 02/03/2020, lovw/harding 03/11/20

## 2020-06-14 DIAGNOSIS — J449 Chronic obstructive pulmonary disease, unspecified: Secondary | ICD-10-CM | POA: Diagnosis not present

## 2020-06-22 DIAGNOSIS — J449 Chronic obstructive pulmonary disease, unspecified: Secondary | ICD-10-CM | POA: Diagnosis not present

## 2020-06-22 DIAGNOSIS — I10 Essential (primary) hypertension: Secondary | ICD-10-CM | POA: Diagnosis not present

## 2020-06-22 DIAGNOSIS — J9611 Chronic respiratory failure with hypoxia: Secondary | ICD-10-CM | POA: Diagnosis not present

## 2020-06-22 DIAGNOSIS — I700 Atherosclerosis of aorta: Secondary | ICD-10-CM | POA: Diagnosis not present

## 2020-06-22 DIAGNOSIS — Z0001 Encounter for general adult medical examination with abnormal findings: Secondary | ICD-10-CM | POA: Diagnosis not present

## 2020-06-23 DIAGNOSIS — Z0001 Encounter for general adult medical examination with abnormal findings: Secondary | ICD-10-CM | POA: Diagnosis not present

## 2020-06-23 DIAGNOSIS — E039 Hypothyroidism, unspecified: Secondary | ICD-10-CM | POA: Diagnosis not present

## 2020-06-23 DIAGNOSIS — E7849 Other hyperlipidemia: Secondary | ICD-10-CM | POA: Diagnosis not present

## 2020-06-23 DIAGNOSIS — Z1389 Encounter for screening for other disorder: Secondary | ICD-10-CM | POA: Diagnosis not present

## 2020-06-23 DIAGNOSIS — E559 Vitamin D deficiency, unspecified: Secondary | ICD-10-CM | POA: Diagnosis not present

## 2020-06-23 DIAGNOSIS — E538 Deficiency of other specified B group vitamins: Secondary | ICD-10-CM | POA: Diagnosis not present

## 2020-06-28 DIAGNOSIS — I5032 Chronic diastolic (congestive) heart failure: Secondary | ICD-10-CM | POA: Diagnosis not present

## 2020-06-28 DIAGNOSIS — I480 Paroxysmal atrial fibrillation: Secondary | ICD-10-CM | POA: Diagnosis not present

## 2020-06-28 DIAGNOSIS — J449 Chronic obstructive pulmonary disease, unspecified: Secondary | ICD-10-CM | POA: Diagnosis not present

## 2020-06-28 DIAGNOSIS — I110 Hypertensive heart disease with heart failure: Secondary | ICD-10-CM | POA: Diagnosis not present

## 2020-07-15 DIAGNOSIS — J449 Chronic obstructive pulmonary disease, unspecified: Secondary | ICD-10-CM | POA: Diagnosis not present

## 2020-07-28 DIAGNOSIS — I480 Paroxysmal atrial fibrillation: Secondary | ICD-10-CM | POA: Diagnosis not present

## 2020-07-28 DIAGNOSIS — I5032 Chronic diastolic (congestive) heart failure: Secondary | ICD-10-CM | POA: Diagnosis not present

## 2020-07-28 DIAGNOSIS — J449 Chronic obstructive pulmonary disease, unspecified: Secondary | ICD-10-CM | POA: Diagnosis not present

## 2020-07-28 DIAGNOSIS — I110 Hypertensive heart disease with heart failure: Secondary | ICD-10-CM | POA: Diagnosis not present

## 2020-08-14 DIAGNOSIS — J449 Chronic obstructive pulmonary disease, unspecified: Secondary | ICD-10-CM | POA: Diagnosis not present

## 2020-09-14 DIAGNOSIS — J449 Chronic obstructive pulmonary disease, unspecified: Secondary | ICD-10-CM | POA: Diagnosis not present

## 2020-09-29 ENCOUNTER — Other Ambulatory Visit: Payer: Self-pay

## 2020-09-29 ENCOUNTER — Encounter: Payer: Self-pay | Admitting: Internal Medicine

## 2020-09-29 ENCOUNTER — Ambulatory Visit: Payer: Medicare Other | Admitting: Internal Medicine

## 2020-09-29 DIAGNOSIS — J9611 Chronic respiratory failure with hypoxia: Secondary | ICD-10-CM | POA: Diagnosis not present

## 2020-09-29 DIAGNOSIS — J449 Chronic obstructive pulmonary disease, unspecified: Secondary | ICD-10-CM | POA: Diagnosis not present

## 2020-09-29 DIAGNOSIS — E669 Obesity, unspecified: Secondary | ICD-10-CM

## 2020-09-29 DIAGNOSIS — J9612 Chronic respiratory failure with hypercapnia: Secondary | ICD-10-CM | POA: Diagnosis not present

## 2020-09-29 MED ORDER — ALBUTEROL SULFATE (2.5 MG/3ML) 0.083% IN NEBU: 2.5000 mg | INHALATION_SOLUTION | RESPIRATORY_TRACT | 12 refills | Status: AC | PRN

## 2020-09-29 MED ORDER — METHYLPREDNISOLONE ACETATE 80 MG/ML IJ SUSP
120.0000 mg | Freq: Once | INTRAMUSCULAR | Status: AC
  Administered 2020-09-29: 120 mg via INTRAMUSCULAR

## 2020-09-29 MED ORDER — TRELEGY ELLIPTA 100-62.5-25 MCG/INH IN AEPB: 1.0000 | INHALATION_SPRAY | Freq: Every day | RESPIRATORY_TRACT | 0 refills | Status: AC

## 2020-09-29 NOTE — Addendum Note (Signed)
Addended by: Rosana Berger on: 09/29/2020 12:26 PM   Modules accepted: Orders

## 2020-09-29 NOTE — Assessment & Plan Note (Addendum)
Quit smoking 2014 - Alpha one  11/18/12  = 380  Spirometry  11/11/13   FEV1 0.88 (22%)  Ratio 0.38 with classic concave curvature  - 06/04/2019  After extensive coaching inhaler device,  effectiveness =    75% (short Ti)    Group D in terms of symptom/risk and laba/lama/ICS  therefore appropriate rx at this point >>>  Continue trelegy and approp saba   Re saba I spent extra time with pt today reviewing appropriate use of albuterol for prn use on exertion with the following points: 1) saba is for relief of sob that does not improve by walking a slower pace or resting but rather if the pt does not improve after trying this first. 2) If the pt is convinced, as many are, that saba helps recover from activity faster then it's easy to tell if this is the case by re-challenging : ie stop, take the inhaler, then p 5 minutes try the exact same activity (intensity of workload) that just caused the symptoms and see if they are substantially diminished or not after saba 3) if there is an activity that reproducibly causes the symptoms, try the saba 15 min before the activity on alternate days   If in fact the saba really does help, then fine to continue to use it prn but advised may need to look closer at the maintenance regimen being used to achieve better control of airways disease with exertion.   Re prednisone: He insists the depomedrol helps more than the pills (even medrol) but I see he is progressively gaining wt with systemic steroid exp and inactivity  so advised to wean off prednisone as tol using the "lowest effective dose" approach in this setting p yielding to his request for "one last shot of depo" today.

## 2020-09-29 NOTE — Assessment & Plan Note (Signed)
Advised re effects on abd compliance as well as burden in terms of workload when he walks, a double whammy that steroids are probably contributing to so rec lowest effective dose as above    >>>> f/u q 73m, call sooner if needed

## 2020-09-29 NOTE — Progress Notes (Signed)
Benjamin [name], adult    DOB: 03/03/1955,     MRN: [id number]   Brief patient profile:  89   yowm  Quit smoking 2014 p onset around 2005 of sob and 02 dep at hs but since 2014 24/7 at 4 lpm and followed previously by Dr Luan Pulling so referred back  to pulmonary clinic in [facility]  06/04/2019  By Dr Ardis Hughs who has been following for Pancreatic cyst vs mass as pt has turned down lap.   History of Present Illness  06/04/2019  Pulmonary/ 1st office eval/Josefina Rynders  Chief Complaint  Patient presents with   Consult    head and nose congestion.  clear secretions when he blows his nose or coughs.  sob with any exertion.  Pollen has caused congestion.  establish with pulmonary doctor.  Dyspnea:  X  MMRC3 = can't walk 100 yards even at a slow pace at a flat grade s stopping due to sob / can't get groceries in from car s help/ still occ goes to [facility] x 200 ft flat  Cough: dry / lots of nasal congestion  Sleep: lie flat/ 4lpm / one pillow SABA use: proair 2-3 x day mostly p activity never neb  Wt 220 on year prior to OV  Up 15lb since quarantine  02 4lpm 24/7 not checking sats Rec Plan A = Automatic = Always=    Trelegy one click but take two deep drags then rinse and gargle  Plan B = Backup (to supplement plan A, not to replace it) Only use your albuterol inhaler as a rescue medication Work on inhaler technique:  Plan C = Crisis (instead of Plan B but only if Plan B stops working) - only use your albuterol nebulizer if you first try Plan B and it fails to help  Plan D = Deltasone = prednisone = medrol - takea course of medrol if not improving with ABC 02 is 24/7 . Make sure you check your oxygen saturations at highest level of activity to be sure it stays over 90%      09/29/2020  f/u ov/[place] office/Shaely Gadberry re: GOLD IV hypercarbic/hypoxemic maint on trelegy   Chief Complaint  Patient presents with   Follow-up    Patient came into the office with shortness of breath. Needed a wheelchair to get to  the exam room. 4LO2 currently in the room, sats are 97. Hasn't noticed anymore coughing than what is normal for him occasionally coughs up clear mucus.Shortness of breath has worsened when he is using exertion and walking.    Dyspnea:  just in house / 50 ft on POC 4lpm no change Cough: none/ hoarse typical for him  Sleeping: flat bed and 2 pillows  SABA use: just using hfa occ/ has neb but no meds for it 02: 4lpm 24/7  Covid status: vax x 3     No obvious day to day or daytime variability or assoc excess/ purulent sputum or mucus plugs or hemoptysis or cp or chest tightness, subjective wheeze or overt sinus or hb symptoms.   Sleeping  without nocturnal   exacerbation  of respiratory  c/o's or need for noct saba. Also denies any obvious fluctuation of symptoms with weather or environmental changes or other aggravating or alleviating factors except as outlined above   No unusual exposure hx or h/o childhood pna/ asthma or knowledge of premature birth.  Current Allergies, Complete Past Medical History, Past Surgical History, Family History, and Social History were reviewed in [employer]  electronic medical record.  ROS  The following are not active complaints unless bolded Hoarseness, sore throat, dysphagia, dental problems, itching, sneezing,  nasal congestion or discharge of excess mucus or purulent secretions, ear ache,   fever, chills, sweats, unintended wt loss or wt gain, classically pleuritic or exertional cp,  orthopnea pnd or arm/hand swelling  or leg swelling, presyncope, palpitations, abdominal pain, anorexia, nausea, vomiting, diarrhea  or change in bowel habits or change in bladder habits, change in stools or change in urine, dysuria, hematuria,  rash, arthralgias, visual complaints, headache, numbness, weakness or ataxia or problems with walking or coordination,  change in mood or  memory.        Current Meds  Medication Sig   acetaminophen (TYLENOL) 500 MG tablet Take 1,000 mg  by mouth every 6 (six) hours as needed for moderate pain or headache.    albuterol (PROVENTIL HFA;VENTOLIN HFA) 108 (90 BASE) MCG/ACT inhaler Inhale 2 puffs into the lungs every 6 (six) hours as needed for wheezing.   alum & mag hydroxide-simeth (MAALOX/MYLANTA) 200-200-20 MG/5ML suspension Take 15 mLs by mouth every 6 (six) hours as needed for indigestion or heartburn.   apixaban (ELIQUIS) 5 MG TABS tablet Take 1 tablet (5 mg total) by mouth 2 (two) times daily.   bisoprolol (ZEBETA) 5 MG tablet TAKE 1/2 TABLET BY MOUTH EVERY DAY   cetirizine (ZYRTEC) 10 MG tablet Take 10 mg by mouth daily as needed for allergies.   FEROSUL 325 (65 Fe) MG tablet Take 325 mg by mouth 3 (three) times daily.   Fluticasone-Umeclidin-Vilant (TRELEGY ELLIPTA) 100-62.5-25 MCG/INH AEPB Inhale 1 puff into the lungs daily.   furosemide (LASIX) 40 MG tablet TAKE ONE TO TWO TABLETS BY MOUTH EVERY MORNING AS NEEDED AND TAKE 1 TABLET BY MOUTH EVERY EVENING   losartan (COZAAR) 25 MG tablet TAKE TWO TABLETS BY MOUTH EVERY DAY   nitroGLYCERIN (NITROSTAT) 0.4 MG SL tablet Place 1 tablet (0.4 mg total) under the tongue every 5 (five) minutes x 3 doses as needed for chest pain.   OXYGEN-HELIUM IN Inhale into the lungs. Using 4 liters nasal cannula ,continuous   pantoprazole (PROTONIX) 40 MG tablet Take 40 mg by mouth daily.   potassium chloride (KLOR-CON) 10 MEQ tablet TAKE 1 TABLET BY MOUTH EVERY DAY   predniSONE (DELTASONE) 5 MG tablet Take 5 mg by mouth daily with breakfast.   rosuvastatin (CRESTOR) 40 MG tablet TAKE 1 TABLET BY MOUTH EVERY DAY   sodium chloride (OCEAN) 0.65 % SOLN nasal spray Place 1 spray into both nostrils as needed for congestion.              Past Medical History:  Diagnosis Date   Anxiety    Asthma    Atrial fibrillation with RVR - peri-MI; recurrent after DCCV 11/19/2012   CAD S/P percutaneous coronary angioplasty  11/18/2012   - PCI mid RCA - 5.0 mm x 24 mm VeriFlex BMS (5.6 mm)   Chronic  combined systolic and diastolic heart failure, NYHA class 2 ([place]) 11/17/2012   Colon polyp    Status post polypectomy x3.   COPD (chronic obstructive pulmonary disease) (HCC)    Dyslipidemia- low HDL 11/19/2012   Heart attack ([place])    Heart disease    Heartburn    High cholesterol    Hypertension    Ischemic cardiomyopathy 11/19/2012   EF 40-45% with inferoseptal HK, Gr 2DD - high LVEDP/LAP. --> confirmed by f/u Echo 03/2013.   STEMI of inferior wall  11/18/12- RCA BMS 11/17/2012   Cardiogenic shock - post MI with RV infarction [785.51]   Stomach ulcer    Tobacco abuse    40 Pk-yr (1- 1 1/2 PPD) --> Quit 11/18/2012 when presented with STEMI       Objective:    Wt Readings from Last 3 Encounters:  09/29/20 245 lb 0.6 oz (111.1 kg)  03/11/20 233 lb (105.7 kg)  06/04/19 235 lb (106.6 kg)      Vital signs reviewed  09/29/2020  - Note at rest 02 sats  97% on 4lpm    General appearance:    obese wm chronically ill w/c dep  HEENT : pt wearing mask not removed for exam due to covid -19 concerns.    NECK :  without JVD/Nodes/TM/ nl carotid upstrokes bilaterally   LUNGS: no acc muscle use,  Mod barrel  contour chest wall with bilateral  Distant bs s audible wheeze and  without cough on insp or exp maneuvers and mod  Hyperresonant  to  percussion bilaterally     CV:  RRR  no s3 or murmur or increase in P2, and no edema   ABD:  quite obese but nontender with pos mid insp Hoover's  in the supine position. No bruits or organomegaly appreciated, bowel sounds nl  MS:     ext warm without deformities, calf tenderness, cyanosis or clubbing No obvious joint restrictions   SKIN: warm and dry without lesions    NEURO:  alert, approp, nl sensorium with  no motor or cerebellar deficits apparent.                Assessment

## 2020-09-29 NOTE — Assessment & Plan Note (Signed)
02 dep since 2014 -  HC03  2018  = 32 to 34  - as of 06/04/2019 = 4lpm 24/7  Advised: Make sure you check your oxygen saturation  at your highest level of activity  to be sure it stays over 90% and adjust  02 flow upward to maintain this level if needed but remember to turn it back to previous settings when you stop (to conserve your supply).           Each maintenance medication was reviewed in detail including emphasizing most importantly the difference between maintenance and prns and under what circumstances the prns are to be triggered using an action plan format where appropriate.  Total time for H and P, chart review, counseling, reviewing dpi/hfa/neb/02  device(s) and generating customized AVS unique to this office visit / same day charting = 34 min

## 2020-09-29 NOTE — Patient Instructions (Addendum)
Plan A = Automatic = Always=    Trelegy [id number] one click each am   Plan B = Backup (to supplement plan A, not to replace it) Only use your albuterol inhaler as a rescue medication to be used if you can't catch your breath by resting or doing a relaxed purse lip breathing pattern.  - The less you use it, the better it will work when you need it. - Ok to use the inhaler up to 2 puffs  every 4 hours if you must but call for appointment if use goes up over your usual need - Don't leave home without it !!  (think of it like the spare tire for your car)   Plan C = Crisis (instead of Plan B but only if Plan B stops working) - only use your albuterol nebulizer if you first try Plan B and it fails to help > ok to use the nebulizer up to every 4 hours but if start needing it regularly call for immediate appointment  Depomedrol 120 mg IM today    Ok to try albuterol 15 min before an activity (on alternating days)  that you know would usually make you short of breath and see if it makes any difference and if makes none then don't take albuterol after activity unless you can't catch your breath as this means it's the resting that helps, not the albuterol.  Please schedule a follow up visit in 12 months but call sooner if needed

## 2020-09-29 NOTE — Addendum Note (Signed)
Addended by: Fritzi Mandes D on: 09/29/2020 01:35 PM   Modules accepted: Orders

## 2020-10-10 ENCOUNTER — Encounter: Payer: Self-pay | Admitting: Cardiology

## 2020-10-10 ENCOUNTER — Other Ambulatory Visit: Payer: Self-pay

## 2020-10-10 ENCOUNTER — Ambulatory Visit: Payer: Medicare Other | Admitting: Cardiology

## 2020-10-10 VITALS — BP 130/74 | HR 65 | Ht 71.0 in | Wt 242.0 lb

## 2020-10-10 DIAGNOSIS — E669 Obesity, unspecified: Secondary | ICD-10-CM

## 2020-10-10 DIAGNOSIS — I255 Ischemic cardiomyopathy: Secondary | ICD-10-CM | POA: Diagnosis not present

## 2020-10-10 DIAGNOSIS — E785 Hyperlipidemia, unspecified: Secondary | ICD-10-CM | POA: Diagnosis not present

## 2020-10-10 DIAGNOSIS — J9611 Chronic respiratory failure with hypoxia: Secondary | ICD-10-CM

## 2020-10-10 DIAGNOSIS — J9612 Chronic respiratory failure with hypercapnia: Secondary | ICD-10-CM

## 2020-10-10 DIAGNOSIS — R0600 Dyspnea, unspecified: Secondary | ICD-10-CM

## 2020-10-10 DIAGNOSIS — I480 Paroxysmal atrial fibrillation: Secondary | ICD-10-CM | POA: Diagnosis not present

## 2020-10-10 DIAGNOSIS — I10 Essential (primary) hypertension: Secondary | ICD-10-CM | POA: Diagnosis not present

## 2020-10-10 DIAGNOSIS — Z9861 Coronary angioplasty status: Secondary | ICD-10-CM | POA: Diagnosis not present

## 2020-10-10 DIAGNOSIS — E1169 Type 2 diabetes mellitus with other specified complication: Secondary | ICD-10-CM

## 2020-10-10 DIAGNOSIS — I5032 Chronic diastolic (congestive) heart failure: Secondary | ICD-10-CM

## 2020-10-10 DIAGNOSIS — I2510 Atherosclerotic heart disease of native coronary artery without angina pectoris: Secondary | ICD-10-CM

## 2020-10-10 DIAGNOSIS — R0609 Other forms of dyspnea: Secondary | ICD-10-CM

## 2020-10-10 MED ORDER — LOSARTAN POTASSIUM 50 MG PO TABS: 50.0000 mg | ORAL_TABLET | Freq: Every day | ORAL | 3 refills | Status: DC

## 2020-10-10 NOTE — Progress Notes (Signed)
Primary Care Provider: Redmond School, MD Cardiologist: Glenetta Hew, MD Electrophysiologist: None  Clinic Note: Chief Complaint  Patient presents with   Follow-up    7 months.   Shortness of Breath   ===================================  ASSESSMENT/PLAN   Problem List Items Addressed This Visit       Cardiology Problems   CAD S/P percutaneous coronary angioplasty - PCI mid RCA - 5.0 mm x 24 mm VeriFlex BMS (5.6 mm) - Primary (Chronic)    Very large caliber RCA treated with bare-metal stent dilated up to 5 and half millimeter.  No longer on clopidogrel or statin with being on a DOAC.  Plan: On stable dose of bisoprolol, will increase losartan to 50 mg. For now continue current dose of rosuvastatin.  May need to adjust based on follow-up plans.      Relevant Medications   losartan (COZAAR) 50 MG tablet   Other Relevant Orders   EKG 12-Lead (Completed)   Cardiomyopathy, ischemic - EF 40-45%; now up to 55-60% (Chronic)    He had notable improvement of his EF post PCI.  Most of the CHF symptoms are probably Strader related to diastolic dysfunction.      Relevant Medications   losartan (COZAAR) 50 MG tablet   Chronic diastolic HF (heart failure), NYHA class 2 (HCC) (Chronic)    Chronic HFpEF which exacerbates her COPD.  I do not really think a 70 active HFpEF symptoms for now.  He is on bisoprolol and losartan which have been increased to 50 mg daily.  He is also on furosemide currently taking 80 mg alternating with 40 mg on a daily basis.  Unfortunately keeps gaining weight.  Does not seem to be related to hypovolemia.  For now, continue alternating furosemide 80 mg 1 day 40 mg of the day and take additional 40 mg necessary. Able to tolerate low-dose bisoprolol and losartan, but she is not taking the full 50 mg losartan.  Will increase to full 50 mg.      Relevant Medications   losartan (COZAAR) 50 MG tablet   Other Relevant Orders   EKG 12-Lead (Completed)    Hyperlipidemia associated with type 2 diabetes mellitus (HCC) (Chronic)    Lipids were little better controlled last year they are now.  LDL is up a tad bit.  For now, we will simply continue current dose of Crestor, but low threshold to potentially add something new.      Relevant Medications   losartan (COZAAR) 50 MG tablet   Other Relevant Orders   EKG 12-Lead (Completed)   Paroxysmal Atrial fibrillation with RVR - peri-MI; recurrent after DCCV (Chronic)    Thankfully, he has not had any further breakthrough spells like [place].  I think you will be quite symptomatic with diastolic dysfunction and underlying COPD leading to progression of his HFpEF symptoms which would not be well tolerated.  This patients CHA2DS2-VASc Score and unadjusted Ischemic Stroke Rate (% per year) is equal to 3.2 % stroke rate/year from a score of 3  Above score calculated as 1 point each if present [CHF, HTN, DM, Vascular=MI/PAD/Aortic Plaque, Age if 65-74, or Male] Above score calculated as 2 points each if present [Age > 75, or Stroke/TIA/TE]  Remains on bisoprolol, for rate control along with Eliquis for anticoagulation.       Relevant Medications   losartan (COZAAR) 50 MG tablet   Other Relevant Orders   EKG 12-Lead (Completed)   Essential hypertension (Chronic)   Relevant Medications  losartan (COZAAR) 50 MG tablet   Other Relevant Orders   EKG 12-Lead (Completed)     Other   Obesity (BMI 30.0-34.9) (Chronic)   Relevant Orders   EKG 12-Lead (Completed)   Chronic respiratory failure with hypoxia and hypercapnia (HCC) (Chronic)    I think at this point, he really needs to continue to work with his pulmonologist.  Requiring more oxygen.   To avoid exacerbating factors from HFpEF, I agree with status of Lasix with the quick ability to switch to 80 mg as opposed to 40.  Can also take additional dose in the afternoon.  Continue pulmonary care.      Relevant Orders   EKG 12-Lead (Completed)    Dyspnea on minimal exertion    I think his dyspnea is mostly related to his pulmonary issues.  He is not really having any anginal symptoms to speak of.  Not really having orthopnea or PND symptoms to suggest reduced EF. In the absence of real CHF symptoms, and leery of checking a 2D echo as it would not likely change anything.       ===================================  HPI:    Benjamin [name] is an obese 65 y.o. adult Former Smoker (quit several yrs ago) with Severe COPD (GOLD IV, on Home O2 4L) with Resolved ICM 2/2 Inferior STEMI (Oct 2014 - DES PCI RCA, EF 40-45% - improved to 55-60%), PAF (no recurrence in > 18 months), HTN & HLD, below who presents today for ~ 6 month f/u.  Benjamin [name] was last seen on March 11, 2020 -> was noting that his weight is gone up some with maybe some mild edema mostly just abdominal girth.  He had increased his Lasix to 80 mg a morning and 40 mg in the afternoon.  He denies any real PND orthopnea, just his chronic dyspnea and sleeps in a recliner from his COPD issues.  He had a panic attack that caused a brief exacerbation, but denied any symptoms of A. fib or progressive CHF findings. -> Noted stable baseline exertional dyspnea and perhaps mildly increased edema as well as orthopnea.  Recent Hospitalizations: None  Reviewed  CV studies:    The following studies were reviewed today: (if available, images/films reviewed: From Epic Chart or Care Everywhere) None:  Interval History:   Benjamin [name] returns here today still short of breath, he he has gained about 10 pounds, indicated he is really sedentary and has had a hard time keeping weight off--because just simply cannot exercise.  He notes pain too short of breath with walking around, and 2-week.  He says he tires out so easily that he cannot do much..  Necessarily notice any worsening edema or real abdominal girth.  No real change to his orthopnea and no PND.  He does get short of breath  but no real PND symptoms.  He has pretty frequent morning cough, but has not any recent COPD exacerbations.  He is actually increase his oxygen levels and has had episodes where his oxygen drops to 80s.  Extremities he takes off his oxygen, his oxygen levels dropped to 88, and then after several minutes it dropped below 80.  As usual, is accompanied by his daughter.  CV Review of Symptoms (Summary) Cardiovascular ROS: positive for - dyspnea on exertion, edema, shortness of breath, and these are described above negative for - chest pain, irregular heartbeat, loss of consciousness, palpitations, paroxysmal nocturnal dyspnea, rapid heart rate, or lightheadedness or dizziness except when short  of breath, syncope/near syncope or TIA/amaurosis fugax -> not walking enough to notice claudication  REVIEWED OF SYSTEMS   Review of Systems  Constitutional:  Positive for malaise/fatigue. Negative for weight loss (Actually gained 10 more pounds).  HENT:  Positive for congestion. Negative for nosebleeds.   Eyes:        Vision is getting worse  Respiratory:  Positive for cough, sputum production, shortness of breath (Started a little worse) and wheezing. Negative for hemoptysis.   Cardiovascular:  Negative for claudication (Does not walk enough to notice).  Gastrointestinal:  Positive for abdominal pain (With constipation) and constipation (Off and on.). Negative for blood in stool and melena.  Genitourinary:  Negative for dysuria and hematuria.  Musculoskeletal:  Positive for back pain, joint pain and neck pain. Negative for falls.  Psychiatric/Behavioral:  Positive for memory loss and suicidal ideas. The patient is nervous/anxious and has insomnia.    I have reviewed and (if needed) personally updated the patient's problem list, medications, allergies, past medical and surgical history, social and family history.   PAST MEDICAL HISTORY   Past Medical History:  Diagnosis Date   Anxiety    Asthma     Atrial fibrillation with RVR - peri-MI; recurrent after DCCV 11/19/2012   CAD S/P percutaneous coronary angioplasty  11/18/2012   - PCI mid RCA - 5.0 mm x 24 mm VeriFlex BMS (5.6 mm)   Chronic combined systolic and diastolic heart failure, NYHA class 2 ([place]) 11/17/2012   Colon polyp    Status post polypectomy x3.   COPD (chronic obstructive pulmonary disease) (HCC)    Dyslipidemia- low HDL 11/19/2012   Heart attack ([place])    Heart disease    Heartburn    High cholesterol    Hypertension    Ischemic cardiomyopathy 11/19/2012   EF 40-45% with inferoseptal HK, Gr 2DD - high LVEDP/LAP. --> confirmed by f/u Echo 03/2013.   STEMI of inferior wall 11/18/12- RCA BMS 11/17/2012   Cardiogenic shock - post MI with RV infarction [785.51]   Stomach ulcer    Tobacco abuse    40 Pk-yr (1- 1 1/2 PPD) --> Quit 11/18/2012 when presented with STEMI    PAST SURGICAL HISTORY   Past Surgical History:  Procedure Laterality Date   COLONOSCOPY N/A 11/20/2013   Procedure: COLONOSCOPY;  Surgeon: Jamesetta So, MD;  [facility];  Service: Gastroenterology;  Laterality: N/A;   CORONARY ANGIOPLASTY WITH STENT PLACEMENT Right 11/17/12   Mid RCA aspiration thrombectomy and PCI with VeriFlex BMS 5.0 mm x 24 mm (5.6 mm; also PTCA of distal RPL 4 distal embolization.   Dental extractions     ESOPHAGOGASTRODUODENOSCOPY (EGD) WITH PROPOFOL N/A 03/12/2019   Procedure: ESOPHAGOGASTRODUODENOSCOPY (EGD) WITH PROPOFOL;  Surgeon: Milus Banister, MD;  Location: [facility];  Service: Endoscopy;  Laterality: N/A;   EUS N/A 03/12/2019   Procedure: UPPER ENDOSCOPIC ULTRASOUND (EUS) RADIAL;  Surgeon: Milus Banister, MD;  Location: [facility];  Service: Endoscopy;  Laterality: N/A;   FINE NEEDLE ASPIRATION N/A 03/12/2019   Procedure: FINE NEEDLE ASPIRATION (FNA) LINEAR;  Surgeon: Milus Banister, MD;  Location: [facility];  Service: Endoscopy;  Laterality: N/A;   HERNIA REPAIR  1997, 2010?   LEFT HEART  CATHETERIZATION WITH CORONARY ANGIOGRAM N/A 11/17/2012   Procedure: LEFT HEART CATHETERIZATION WITH CORONARY ANGIOGRAM;  Surgeon: Leonie Man, MD;  [facility] CATH LAB: for Inferior STEMI -> EF 50-55%-mod basal -apical inferior HK. (LCA- Normal) LM -very large -->Tortuous, wraparound  LAD -> Large bifurcating D1. Cx -large, no-dom - large LatOM1, small AVG &2 small OMs.  RCA (Very Large) - m90% subtototal -> RPAV(large PL2) & rPDA mild distal embolization   TOE SURGERY     great toe   TONSILLECTOMY     TRANSTHORACIC ECHOCARDIOGRAM  11/18/2012   Normal LV size, moderate reduced function 40-45% with severe HK-AK of the inferoseptal wall with incoordinate septal motion.   TRANSTHORACIC ECHOCARDIOGRAM  March 15   Moderately reduced LVEF of 40 at 45% with inferoseptal hypokinesis and high filling pressures.   TRANSTHORACIC ECHOCARDIOGRAM  04/2015   EF 55-60%.; Pericardial fat pad versus effusion; no tamponade    Immunization History  Administered Date(s) Administered   Influenza,inj,Quad PF,6+ Mos 10/29/2017   Moderna Sars-Covid-2 Vaccination 04/29/2019, 05/25/2019    MEDICATIONS/ALLERGIES   Current Meds  Medication Sig   acetaminophen (TYLENOL) 500 MG tablet Take 1,000 mg by mouth every 6 (six) hours as needed for moderate pain or headache.    albuterol (PROVENTIL HFA;VENTOLIN HFA) 108 (90 BASE) MCG/ACT inhaler Inhale 2 puffs into the lungs every 6 (six) hours as needed for wheezing.   albuterol (PROVENTIL) (2.5 MG/3ML) 0.083% nebulizer solution Take 3 mLs (2.5 mg total) by nebulization every 4 (four) hours as needed for wheezing or shortness of breath.   apixaban (ELIQUIS) 5 MG TABS tablet Take 1 tablet (5 mg total) by mouth 2 (two) times daily.   bisoprolol (ZEBETA) 5 MG tablet TAKE 1/2 TABLET BY MOUTH EVERY DAY   FEROSUL 325 (65 Fe) MG tablet Take 325 mg by mouth 3 (three) times daily.   Fluticasone-Umeclidin-Vilant (TRELEGY ELLIPTA) 100-62.5-25 MCG/INH AEPB Inhale 1 puff into the lungs daily.    Fluticasone-Umeclidin-Vilant (TRELEGY ELLIPTA) 100-62.5-25 MCG/INH AEPB Inhale 1 puff into the lungs daily for 14 days.   furosemide (LASIX) 40 MG tablet TAKE ONE TO TWO TABLETS BY MOUTH EVERY MORNING AS NEEDED AND TAKE 1 TABLET BY MOUTH EVERY EVENING   losartan (COZAAR) 25 MG tablet TAKE TWO TABLETS BY MOUTH EVERY DAY --> He has only been taking 1 tablet daily.   nitroGLYCERIN (NITROSTAT) 0.4 MG SL tablet Place 1 tablet (0.4 mg total) under the tongue every 5 (five) minutes x 3 doses as needed for chest pain.   OXYGEN-HELIUM IN Inhale into the lungs. Using 4 liters nasal cannula ,continuous   pantoprazole (PROTONIX) 40 MG tablet Take 40 mg by mouth daily.   potassium chloride (KLOR-CON) 10 MEQ tablet TAKE 1 TABLET BY MOUTH EVERY DAY   rosuvastatin (CRESTOR) 40 MG tablet TAKE 1 TABLET BY MOUTH EVERY DAY   sodium chloride (OCEAN) 0.65 % SOLN nasal spray Place 1 spray into both nostrils as needed for congestion.    Allergies  Allergen Reactions   Codeine     Red blotches   Penicillins     Unknown reaction Did it involve swelling of the face/tongue/throat, SOB, or low BP? Unknown Did it involve sudden or severe rash/hives, skin peeling, or any reaction on the inside of your mouth or nose? Unknown Did you need to seek medical attention at a hospital or doctor's office? Unknown When did it last happen?      childhood allergy If all above answers are "NO", may proceed with cephalosporin use.     SOCIAL HISTORY/FAMILY HISTORY   Reviewed in Epic:  Pertinent findings:  Social History   Tobacco Use   Smoking status: Former    Packs/day: 2.00    Years: 40.00    Pack years: 80.00  Types: Cigarettes    Quit date: 03/02/2012    Years since quitting: 8.6   Smokeless tobacco: Never   Tobacco comments:    [place] 10/2102  Substance Use Topics   Alcohol use: No   Drug use: No   Social History   Social History Narrative   Divorced father of 2 (one daughter one  son).   Lives alone, but his daughter who lives nearby is "watching him like a Engineer, drilling ".   He quit smoking during his hospitalization after a 40-pack-year smoking history.      Does not drink alcohol.      Essentially unable to do much of any walking.  Is on home oxygen/oxygen concentrator.  Sleeps in a recliner.  Not exercising.    OBJCTIVE -PE, EKG, labs   Wt Readings from Last 3 Encounters:  10/10/20 242 lb (109.8 kg)  09/29/20 245 lb 0.6 oz (111.1 kg)  03/11/20 233 lb (105.7 kg)    Physical Exam: BP 130/74 (BP Location: Left Arm, Patient Position: Sitting, Cuff Size: Normal)   Pulse 65   Ht '5\' 11"'$  (1.803 m)   Wt 242 lb (109.8 kg)   BMI 33.75 kg/m  Physical Exam Constitutional:      Appearance: She is obese. She is ill-appearing (More chronic than acute; is wearing 4L oxygen nasal concentrator, looks worn out.). She is not toxic-appearing or diaphoretic.     Comments: Weight is clearly up.  HENT:     Head: Normocephalic and atraumatic.  Neck:     Vascular: No carotid bruit.  Cardiovascular:     Rate and Rhythm: Normal rate and regular rhythm. Extrasystoles are present.    Chest Wall: PMI is not displaced.     Pulses: Decreased pulses (Diminished/palpable pedal pulse).     Heart sounds: S1 normal and S2 normal. Heart sounds are distant. No murmur heard.   No friction rub. No gallop.  Pulmonary:     Effort: No respiratory distress.     Breath sounds: Decreased breath sounds (Both bases and midlung fields.), wheezing (Late expiratory) and rhonchi present. No rales.     Comments: Diminished breath sounds throughout with prolonged expiratory wheezing. Accessory muscle use but no respiratory distress. Chest:     Chest wall: No tenderness.  Musculoskeletal:        General: Swelling (Probably 1+ bilateral) present.     Cervical back: Normal range of motion.     Right lower leg: Edema present.     Left lower leg: Edema present.  Skin:    General: Skin is warm and dry.      Coloration: Skin is pale.  Neurological:     General: No focal deficit present.     Mental Status: She is alert and oriented to person, place, and time.  Psychiatric:        Mood and Affect: Mood normal.        Behavior: Behavior normal.     Comments: Somewhat subdued.  Not nearly as focal now.  Defers to his daughter most the time.     Adult ECG Report  Rate: 65 ;  Rhythm: normal sinus rhythm and Low voltage.  Inf MI, age-indeterminate.  Cannot rule out anterior MI, age-indeterminate. ;   Narrative Interpretation: Stable stable  Recent Labs:   06/03/2020: TC 166, TG 03/08/2026, HDL 56 and LDL 73.  Hgb 12.9, Cr 0.91, K+ 4.9 Lab Results  Component Value Date   CHOL 154 06/27/2016  HDL 71 06/27/2016   LDLCALC 61 06/27/2016   TRIG 109 06/27/2016   CHOLHDL 2.2 06/27/2016   Lab Results  Component Value Date   CREATININE 1.00 01/21/2019   BUN 22 06/27/2016   NA 138 06/27/2016   K 4.0 06/27/2016   CL 96 (L) 06/27/2016   CO2 32 (H) 06/27/2016   CBC Latest Ref Rng & Units 03/03/2019 06/03/2015 06/02/2015  WBC 3.8 - 10.8 Thousand/uL 8.8 14.0(H) 22.2(H)  Hemoglobin 13.2 - 17.1 g/dL 12.7(L) 11.1(L) 10.8(L)  Hematocrit 38.5 - 50.0 % 38.6 37.9(L) 36.3(L)  Platelets 140 - 400 Thousand/uL 338 352 416(H)    Lab Results  Component Value Date   HGBA1C 6.9 (H) 06/03/2015   Lab Results  Component Value Date   TSH 2.773 07/14/2013    ==================================================  COVID-19 Education: The signs and symptoms of COVID-19 were discussed with the patient and how to seek care for testing (follow up with PCP or arrange E-visit).    I spent a total of 25 minutes with the patient spent in direct patient consultation.  Additional time spent with chart review  / charting (studies, outside notes, etc): 17 min Total Time: 42  min  Current medicines are reviewed at length with the patient today.  (+/- concerns) none  This visit occurred during the SARS-CoV-2 public health  emergency.  Safety protocols were in place, including screening questions prior to the visit, additional usage of staff PPE, and extensive cleaning of exam room while observing appropriate contact time as indicated for disinfecting solutions.  Notice: This dictation was prepared with Dragon dictation along with smaller phrase technology. Any transcriptional errors that result from this process are unintentional and may not be corrected upon review.  Patient Instructions / Medication Changes & Studies & Tests Ordered   Patient Instructions  Medication Instructions:  Your physician recommends that you continue on your current medications as directed. Please refer to the Current Medication list given to you today.  *We are updating your prescription of Losartan to one 50 mg tablet instead of two '25mg'$  tablets. Please be aware you will take 1 tablet once you pick up your next refill.   *If you need a refill on your cardiac medications before your next appointment, please call your pharmacy*   Lab Work: None  If you have labs (blood work) drawn today and your tests are completely normal, you will receive your results only by: [place] (if you have MyChart) OR A paper copy in the mail If you have any lab test that is abnormal or we need to change your treatment, we will call you to review the results.   Testing/Procedures: none   Follow-Up: At [facility], you and your health needs are our priority.  As part of our continuing mission to provide you with exceptional heart care, we have created designated Provider Care Teams.  These Care Teams include your primary Cardiologist (physician) and Advanced Practice Providers (APPs -  Physician Assistants and Nurse Practitioners) who all work together to provide you with the care you need, when you need it.  We recommend signing up for the patient portal called "MyChart".  Sign up information is provided on this After Visit Summary.   MyChart is used to connect with patients for Virtual Visits (Telemedicine).  Patients are able to view lab/test results, encounter notes, upcoming appointments, etc.  Non-urgent messages can be sent to your provider as well.   To learn more about what you can do with MyChart,  go to [email or web address].    Your next appointment:   12 Months  The format for your next appointment:   In Person  Provider:   Glenetta Hew, MD   Other Instructions none   Studies Ordered:   Orders Placed This Encounter  Procedures   EKG 12-Lead      Glenetta Hew, M.D., M.S. Interventional Cardiologist   Pager # [phone] Phone # [phone] [address] [place], [place]  [address]   Thank you for choosing Heartcare at [facility]!!

## 2020-10-10 NOTE — Patient Instructions (Signed)
Medication Instructions:  Your physician recommends that you continue on your current medications as directed. Please refer to the Current Medication list given to you today.  *We are updating your prescription of Losartan to one 50 mg tablet instead of two '25mg'$  tablets. Please be aware you will take 1 tablet once you pick up your next refill.   *If you need a refill on your cardiac medications before your next appointment, please call your pharmacy*   Lab Work: None  If you have labs (blood work) drawn today and your tests are completely normal, you will receive your results only by: [place] (if you have MyChart) OR A paper copy in the mail If you have any lab test that is abnormal or we need to change your treatment, we will call you to review the results.   Testing/Procedures: none   Follow-Up: At [facility], you and your health needs are our priority.  As part of our continuing mission to provide you with exceptional heart care, we have created designated Provider Care Teams.  These Care Teams include your primary Cardiologist (physician) and Advanced Practice Providers (APPs -  Physician Assistants and Nurse Practitioners) who all work together to provide you with the care you need, when you need it.  We recommend signing up for the patient portal called "MyChart".  Sign up information is provided on this After Visit Summary.  MyChart is used to connect with patients for Virtual Visits (Telemedicine).  Patients are able to view lab/test results, encounter notes, upcoming appointments, etc.  Non-urgent messages can be sent to your provider as well.   To learn more about what you can do with MyChart, go to [email or web address].    Your next appointment:   12 Months  The format for your next appointment:   In Person  Provider:   Glenetta Hew, MD   Other Instructions none

## 2020-10-15 DIAGNOSIS — J449 Chronic obstructive pulmonary disease, unspecified: Secondary | ICD-10-CM | POA: Diagnosis not present

## 2020-10-30 NOTE — Assessment & Plan Note (Signed)
Chronic HFpEF which exacerbates her COPD.  I do not really think a 70 active HFpEF symptoms for now.  He is on bisoprolol and losartan which have been increased to 50 mg daily.  He is also on furosemide currently taking 80 mg alternating with 40 mg on a daily basis.  Unfortunately keeps gaining weight.  Does not seem to be related to hypovolemia.  For now, continue alternating furosemide 80 mg 1 day 40 mg of the day and take additional 40 mg necessary. Able to tolerate low-dose bisoprolol and losartan, but she is not taking the full 50 mg losartan.  Will increase to full 50 mg.

## 2020-10-30 NOTE — Assessment & Plan Note (Signed)
I think at this point, he really needs to continue to work with his pulmonologist.  Requiring more oxygen.   To avoid exacerbating factors from HFpEF, I agree with status of Lasix with the quick ability to switch to 80 mg as opposed to 40.  Can also take additional dose in the afternoon.  Continue pulmonary care.

## 2020-10-30 NOTE — Assessment & Plan Note (Signed)
I think his dyspnea is mostly related to his pulmonary issues.  He is not really having any anginal symptoms to speak of.  Not really having orthopnea or PND symptoms to suggest reduced EF. In the absence of real CHF symptoms, and leery of checking a 2D echo as it would not likely change anything.

## 2020-10-30 NOTE — Assessment & Plan Note (Signed)
He had notable improvement of his EF post PCI.  Most of the CHF symptoms are probably Strader related to diastolic dysfunction.

## 2020-10-30 NOTE — Assessment & Plan Note (Signed)
Very large caliber RCA treated with bare-metal stent dilated up to 5 and half millimeter.  No longer on clopidogrel or statin with being on a DOAC.  Plan: On stable dose of bisoprolol, will increase losartan to 50 mg. For now continue current dose of rosuvastatin.  May need to adjust based on follow-up plans.

## 2020-10-30 NOTE — Assessment & Plan Note (Signed)
Lipids were little better controlled last year they are now.  LDL is up a tad bit.  For now, we will simply continue current dose of Crestor, but low threshold to potentially add something new.

## 2020-10-30 NOTE — Assessment & Plan Note (Signed)
Thankfully, he has not had any further breakthrough spells like Impella.  I think you will be quite symptomatic with diastolic dysfunction and underlying COPD leading to progression of his HFpEF symptoms which would not be well tolerated.  This patients CHA2DS2-VASc Score and unadjusted Ischemic Stroke Rate (% per year) is equal to 3.2 % stroke rate/year from a score of 3  Above score calculated as 1 point each if present [CHF, HTN, DM, Vascular=MI/PAD/Aortic Plaque, Age if 65-74, or Male] Above score calculated as 2 points each if present [Age > 75, or Stroke/TIA/TE]  Remains on bisoprolol, for rate control along with Eliquis for anticoagulation.

## 2020-11-14 DIAGNOSIS — J449 Chronic obstructive pulmonary disease, unspecified: Secondary | ICD-10-CM | POA: Diagnosis not present

## 2020-11-20 ENCOUNTER — Other Ambulatory Visit: Payer: Self-pay | Admitting: Cardiology

## 2020-11-21 NOTE — Telephone Encounter (Signed)
Prescription refill request for Eliquis received. Indication: afib Last office visit: Ellyn Hack, 10/10/2020 Scr: 0.9, 06/23/2020 Age: 65 yo  Weight: 109.8 kg   Refill sent.

## 2020-12-15 DIAGNOSIS — J449 Chronic obstructive pulmonary disease, unspecified: Secondary | ICD-10-CM | POA: Diagnosis not present

## 2021-01-14 DIAGNOSIS — J449 Chronic obstructive pulmonary disease, unspecified: Secondary | ICD-10-CM | POA: Diagnosis not present

## 2021-01-27 DIAGNOSIS — I5032 Chronic diastolic (congestive) heart failure: Secondary | ICD-10-CM | POA: Diagnosis not present

## 2021-01-27 DIAGNOSIS — J449 Chronic obstructive pulmonary disease, unspecified: Secondary | ICD-10-CM | POA: Diagnosis not present

## 2021-01-27 DIAGNOSIS — I480 Paroxysmal atrial fibrillation: Secondary | ICD-10-CM | POA: Diagnosis not present

## 2021-01-27 DIAGNOSIS — I110 Hypertensive heart disease with heart failure: Secondary | ICD-10-CM | POA: Diagnosis not present

## 2021-02-14 DIAGNOSIS — J449 Chronic obstructive pulmonary disease, unspecified: Secondary | ICD-10-CM | POA: Diagnosis not present

## 2021-02-16 ENCOUNTER — Other Ambulatory Visit: Payer: Self-pay | Admitting: Cardiology

## 2021-03-17 DIAGNOSIS — J449 Chronic obstructive pulmonary disease, unspecified: Secondary | ICD-10-CM | POA: Diagnosis not present

## 2021-03-20 ENCOUNTER — Other Ambulatory Visit: Payer: Self-pay | Admitting: Cardiology

## 2021-04-14 DIAGNOSIS — J449 Chronic obstructive pulmonary disease, unspecified: Secondary | ICD-10-CM | POA: Diagnosis not present

## 2021-05-15 DIAGNOSIS — J449 Chronic obstructive pulmonary disease, unspecified: Secondary | ICD-10-CM | POA: Diagnosis not present

## 2021-05-16 ENCOUNTER — Other Ambulatory Visit: Payer: Self-pay | Admitting: Cardiology

## 2021-05-16 DIAGNOSIS — I480 Paroxysmal atrial fibrillation: Secondary | ICD-10-CM

## 2021-05-16 NOTE — Telephone Encounter (Signed)
Prescription refill request for Eliquis received. ?Indication: Afib  ?Last office visit:10/10/20 Ellyn Hack)  ?Scr: 0.91 (06/23/20)  ?Age: 66 ?Weight: 109.8kg ? ?Appropriate dose and refill sent to requested pharmcy.  ?

## 2021-05-28 DIAGNOSIS — I5032 Chronic diastolic (congestive) heart failure: Secondary | ICD-10-CM | POA: Diagnosis not present

## 2021-05-28 DIAGNOSIS — J449 Chronic obstructive pulmonary disease, unspecified: Secondary | ICD-10-CM | POA: Diagnosis not present

## 2021-05-28 DIAGNOSIS — I110 Hypertensive heart disease with heart failure: Secondary | ICD-10-CM | POA: Diagnosis not present

## 2021-05-28 DIAGNOSIS — I480 Paroxysmal atrial fibrillation: Secondary | ICD-10-CM | POA: Diagnosis not present

## 2021-06-14 DIAGNOSIS — J449 Chronic obstructive pulmonary disease, unspecified: Secondary | ICD-10-CM | POA: Diagnosis not present

## 2021-07-15 DIAGNOSIS — J449 Chronic obstructive pulmonary disease, unspecified: Secondary | ICD-10-CM | POA: Diagnosis not present

## 2021-08-14 DIAGNOSIS — J449 Chronic obstructive pulmonary disease, unspecified: Secondary | ICD-10-CM | POA: Diagnosis not present

## 2021-09-14 DIAGNOSIS — J449 Chronic obstructive pulmonary disease, unspecified: Secondary | ICD-10-CM | POA: Diagnosis not present

## 2021-10-09 ENCOUNTER — Encounter: Payer: Self-pay | Admitting: Internal Medicine

## 2021-10-09 ENCOUNTER — Ambulatory Visit (HOSPITAL_COMMUNITY)
Admission: RE | Admit: 2021-10-09 | Discharge: 2021-10-09 | Disposition: A | Discharge status: Home / Self Care | Payer: Medicare Other | Source: Ambulatory Visit | Attending: Internal Medicine | Admitting: Internal Medicine

## 2021-10-09 ENCOUNTER — Ambulatory Visit: Payer: Medicare Other | Admitting: Internal Medicine

## 2021-10-09 ENCOUNTER — Telehealth: Payer: Self-pay | Admitting: Internal Medicine

## 2021-10-09 DIAGNOSIS — R0602 Shortness of breath: Secondary | ICD-10-CM | POA: Diagnosis not present

## 2021-10-09 DIAGNOSIS — J449 Chronic obstructive pulmonary disease, unspecified: Secondary | ICD-10-CM

## 2021-10-09 DIAGNOSIS — J9611 Chronic respiratory failure with hypoxia: Secondary | ICD-10-CM | POA: Diagnosis not present

## 2021-10-09 DIAGNOSIS — J9612 Chronic respiratory failure with hypercapnia: Secondary | ICD-10-CM

## 2021-10-09 MED ORDER — AZITHROMYCIN 250 MG PO TABS: ORAL_TABLET | ORAL | 0 refills | Status: DC

## 2021-10-09 MED ORDER — PREDNISONE 5 MG PO TABS: 5.0000 mg | ORAL_TABLET | Freq: Every day | ORAL | 2 refills | Status: DC

## 2021-10-09 MED ORDER — METHYLPREDNISOLONE ACETATE 80 MG/ML IJ SUSP
80.0000 mg | Freq: Once | INTRAMUSCULAR | Status: DC
  Administered 2021-10-09: 80 mg via INTRAMUSCULAR

## 2021-10-09 MED ORDER — METHYLPREDNISOLONE ACETATE 80 MG/ML IJ SUSP: 80.0000 mg | Freq: Once | INTRAMUSCULAR | Status: AC

## 2021-10-09 NOTE — Telephone Encounter (Signed)
Called and spoke to pharmacy tech. She states per insurance they have to have a days amount for 4 a day until better. She is going to put on prescription 4 a day until better for a week but give patient enough supply for him to take longer if needed. Nothing further needed.

## 2021-10-09 NOTE — Patient Instructions (Addendum)
Plan A = Automatic = Always=    Trelegy 416 one click first thing each am and prednisone 5 mg daily and pantoprazole 40 mg Take 30-60 min before first meal of the day   Plan B = Backup (to supplement plan A, not to replace it) Only use your albuterol inhaler as a rescue medication to be used if you can't catch your breath by resting or doing a relaxed purse lip breathing pattern.  - The less you use it, the better it will work when you need it. - Ok to use the inhaler up to 2 puffs  every 4 hours if you must but call for appointment if use goes up over your usual need - Don't leave home without it !!  (think of it like the spare tire for your car)   Plan C = Crisis (instead of Plan B but only if Plan B stops working) - only use your albuterol nebulizer if you first try Plan B and it fails to help > ok to use the nebulizer up to every 4 hours but if start needing it regularly call for immediate appointment  Plan D  = if ABC not working >> take prednisone 20 mg (4 pills) until better then 2 daily x 5 days then back to [place] to try albuterol 15 min before an activity (on alternating days)  that you know would usually make you short of breath and see if it makes any difference and if makes none then don't take albuterol after activity unless you can't catch your breath as this means it's the resting that helps, not the albuterol.  Please schedule a follow up visit in 3 months but call sooner if needed

## 2021-10-09 NOTE — Progress Notes (Unsigned)
Benjamin [name], adult    DOB: 1955-07-26,     MRN: [id number]   Brief patient profile:  72   yowm  Quit smoking 2014 p onset around 2005 of sob and 02 dep at hs but since 2014 24/7 at 4 lpm and followed previously by Dr Benjamin [name] so referred back  to pulmonary clinic in [facility]  06/04/2019  By Dr Benjamin [name] who has been following for Pancreatic cyst vs mass as pt has turned down lap.   History of Present Illness  06/04/2019  Pulmonary/ 1st office eval/Benjamin [name]  Chief Complaint  Patient presents with   Consult    head and nose congestion.  clear secretions when he blows his nose or coughs.  sob with any exertion.  Pollen has caused congestion.  establish with pulmonary doctor.  Dyspnea:  X  MMRC3 = can't walk 100 yards even at a slow pace at a flat grade s stopping due to sob / can't get groceries in from car s help/ still occ goes to [facility] x 200 ft flat  Cough: dry / lots of nasal congestion  Sleep: lie flat/ 4lpm / one pillow SABA use: proair 2-3 x day mostly p activity never neb  Wt 220 on year prior to OV  Up 15lb since quarantine  02 4lpm 24/7 not checking sats Rec Plan A = Automatic = Always=    Trelegy one click but take two deep drags then rinse and gargle  Plan B = Backup (to supplement plan A, not to replace it) Only use your albuterol inhaler as a rescue medication Work on inhaler technique:  Plan C = Crisis (instead of Plan B but only if Plan B stops working) - only use your albuterol nebulizer if you first try Plan B and it fails to help  Plan D = Deltasone = prednisone = medrol - takea course of medrol if not improving with ABC 02 is 24/7 . Make sure you check your oxygen saturations at highest level of activity to be sure it stays over 90%      09/29/2020  f/u ov/[place] office/Benjamin [name] re: GOLD IV hypercarbic/hypoxemic maint on trelegy   Chief Complaint  Patient presents with   Follow-up    Patient came into the office with shortness of breath. Needed a wheelchair to get to  the exam room. 4LO2 currently in the room, sats are 97. Hasn't noticed anymore coughing than what is normal for him occasionally coughs up clear mucus.Shortness of breath has worsened when he is using exertion and walking.    Dyspnea:  just in house / 50 ft on POC 4lpm no change Cough: none/ hoarse typical for him  Sleeping: flat bed and 2 pillows  SABA use: just using hfa occ/ has neb but no meds for it 02: 4lpm 24/7  Covid status: vax x 3  Rec Plan A = Automatic = Always=    Trelegy 694 one click each am  Plan B = Backup (to supplement plan A, not to replace it) Only use your albuterol inhaler as a rescue medication Plan C = Crisis (instead of Plan B but only if Plan B stops working) - only use your albuterol nebulizer if you first try Plan B and it fails to help  Depomedrol 120 mg IM today  Ok to try albuterol 15 min before an activity (on alternating days)  that you know would usually make you short of breath     10/09/2021  f/u ov/[place] office/Benjamin [name] re: GOLD  4/ hypercarbic hypoxemic maint on trelegy 100 /prednisone '5mg'$   daily x last months   Chief Complaint  Patient presents with   Follow-up    Feels like over the last few weeks his breathing has worsened. Is not able to maintain O2 above 90% on 4LO2 like he usually does.   Dyspnea:  increased  one month Cough: no increase  Sleeping: flat bed 2 pillows on side does fine SABA use: none today  02: 4lpm  Covid status: vax x 3      No obvious day to day or daytime variability or assoc excess/ purulent sputum or mucus plugs or hemoptysis or cp or chest tightness, subjective wheeze or overt sinus or hb symptoms.   *** without nocturnal  or early am exacerbation  of respiratory  c/o's or need for noct saba. Also denies any obvious fluctuation of symptoms with weather or environmental changes or other aggravating or alleviating factors except as outlined above   No unusual exposure hx or h/o childhood pna/ asthma or knowledge of  premature birth.  Current Allergies, Complete Past Medical History, Past Surgical History, Family History, and Social History were reviewed in [employer] record.  ROS  The following are not active complaints unless bolded Hoarseness, sore throat, dysphagia, dental problems, itching, sneezing,  nasal congestion or discharge of excess mucus or purulent secretions, ear ache,   fever, chills, sweats, unintended wt loss or wt gain, classically pleuritic or exertional cp,  orthopnea pnd or arm/hand swelling  or leg swelling, presyncope, palpitations, abdominal pain, anorexia, nausea, vomiting, diarrhea  or change in bowel habits or change in bladder habits, change in stools or change in urine, dysuria, hematuria,  rash, arthralgias, visual complaints, headache, numbness, weakness or ataxia or problems with walking or coordination,  change in mood or  memory.        Current Meds  Medication Sig   acetaminophen (TYLENOL) 500 MG tablet Take 1,000 mg by mouth every 6 (six) hours as needed for moderate pain or headache.    albuterol (PROVENTIL HFA;VENTOLIN HFA) 108 (90 BASE) MCG/ACT inhaler Inhale 2 puffs into the lungs every 6 (six) hours as needed for wheezing.   albuterol (PROVENTIL) (2.5 MG/3ML) 0.083% nebulizer solution Take 3 mLs (2.5 mg total) by nebulization every 4 (four) hours as needed for wheezing or shortness of breath.   alum & mag hydroxide-simeth (MAALOX/MYLANTA) 200-200-20 MG/5ML suspension Take 15 mLs by mouth every 6 (six) hours as needed for indigestion or heartburn.   bisoprolol (ZEBETA) 5 MG tablet TAKE 1/2 TABLET BY MOUTH EVERY DAY   cetirizine (ZYRTEC) 10 MG tablet Take 10 mg by mouth daily as needed for allergies.   ELIQUIS 5 MG TABS tablet TAKE 1 TABLET BY MOUTH TWICE DAILY   FEROSUL 325 (65 Fe) MG tablet Take 325 mg by mouth 3 (three) times daily.   Fluticasone-Umeclidin-Vilant (TRELEGY ELLIPTA) 100-62.5-25 MCG/INH AEPB Inhale 1 puff into the lungs daily.    furosemide (LASIX) 40 MG tablet TAKE ONE TO TWO TABLETS BY MOUTH EVERY MORNING AS NEEDED AND TAKE 1 TABLET BY MOUTH EVERY EVENING   nitroGLYCERIN (NITROSTAT) 0.4 MG SL tablet Place 1 tablet (0.4 mg total) under the tongue every 5 (five) minutes x 3 doses as needed for chest pain.   OXYGEN-HELIUM IN Inhale into the lungs. Using 4 liters nasal cannula ,continuous   pantoprazole (PROTONIX) 40 MG tablet Take 40 mg by mouth daily.   potassium chloride (KLOR-CON M) 10 MEQ tablet TAKE 1  TABLET BY MOUTH EVERY DAY   predniSONE (DELTASONE) 5 MG tablet Take 5 mg by mouth daily with breakfast.   rosuvastatin (CRESTOR) 40 MG tablet TAKE 1 TABLET BY MOUTH EVERY DAY   sodium chloride (OCEAN) 0.65 % SOLN nasal spray Place 1 spray into both nostrils as needed for congestion.   VITAMIN D PO Take by mouth.               Past Medical History:  Diagnosis Date   Anxiety    Asthma    Atrial fibrillation with RVR - peri-MI; recurrent after DCCV 11/19/2012   CAD S/P percutaneous coronary angioplasty  11/18/2012   - PCI mid RCA - 5.0 mm x 24 mm VeriFlex BMS (5.6 mm)   Chronic combined systolic and diastolic heart failure, NYHA class 2 ([place]) 11/17/2012   Colon polyp    Status post polypectomy x3.   COPD (chronic obstructive pulmonary disease) (HCC)    Dyslipidemia- low HDL 11/19/2012   Heart attack ([place])    Heart disease    Heartburn    High cholesterol    Hypertension    Ischemic cardiomyopathy 11/19/2012   EF 40-45% with inferoseptal HK, Gr 2DD - high LVEDP/LAP. --> confirmed by f/u Echo 03/2013.   STEMI of inferior wall 11/18/12- RCA BMS 11/17/2012   Cardiogenic shock - post MI with RV infarction [785.51]   Stomach ulcer    Tobacco abuse    40 Pk-yr (1- 1 1/2 PPD) --> Quit 11/18/2012 when presented with STEMI       Objective:    10/09/2021       ***   09/29/20 245 lb 0.6 oz (111.1 kg)  03/11/20 233 lb (105.7 kg)  06/04/19 235 lb (106.6 kg)    Vital signs reviewed  10/09/2021  - Note at rest 02  sats  ***% on ***   General appearance:    hoarse w/c bound/ edentulous        Mod barr***                  Assessment

## 2021-10-10 ENCOUNTER — Telehealth: Payer: Self-pay | Admitting: Internal Medicine

## 2021-10-10 ENCOUNTER — Encounter: Payer: Self-pay | Admitting: Internal Medicine

## 2021-10-10 NOTE — Assessment & Plan Note (Signed)
Quit smoking 2014 - [employer] [facility]  11/18/12  = 380  Spirometry  11/11/13   FEV1 0.88 (22%)  Ratio 0.38 with classic concave curvature  - 06/04/2019  After extensive coaching inhaler device,  effectiveness =    75% (short Ti)  Refractory copd symptoms on Trelegy 100 each am and  Not using saba as instructed   Group D (now reclassified as E) in terms of symptom/risk and laba/lama/ICS  therefore appropriate rx at this point >>>  Continue trelegy plus zpak to day and modify ABC plan to include approp use of saba and increased pred to 20 mg / day until better then work down to 5 mg daily   Re SABA :  I spent extra time with pt today reviewing appropriate use of albuterol for prn use on exertion with the following points: 1) saba is for relief of sob that does not improve by walking a slower pace or resting but rather if the pt does not improve after trying this first. 2) If the pt is convinced, as many are, that saba helps recover from activity faster then it's easy to tell if this is the case by re-challenging : ie stop, take the inhaler, then p 5 minutes try the exact same activity (intensity of workload) that just caused the symptoms and see if they are substantially diminished or not after saba 3) if there is an activity that reproducibly causes the symptoms, try the saba 15 min before the activity on alternate days   If in fact the saba really does help, then fine to continue to use it prn but advised may need to look closer at the maintenance regimen being used to achieve better control of airways disease with exertion.

## 2021-10-10 NOTE — Assessment & Plan Note (Signed)
02 dep since 2014 -  HC03  2018  = 32 to 34  - as of 06/04/2019 = 4lpm 24/7   Well compensated on present rx  Advised: Make sure you check your oxygen saturation  AT  your highest level of activity (not after you stop)   to be sure it stays over 90% and adjust  02 flow upward to maintain this level if needed but remember to turn it back to previous settings when you stop (to conserve your supply).   F/u q 3 m, sooner if needed          Each maintenance medication was reviewed in detail including emphasizing most importantly the difference between maintenance and prns and under what circumstances the prns are to be triggered using an action plan format where appropriate.  Total time for H and P, chart review, counseling, reviewing hfa/dpi/neb/02  device(s) and generating customized AVS unique to this office visit / same day charting > 30 min for    refractory respiratory  Symptoms

## 2021-10-11 ENCOUNTER — Other Ambulatory Visit: Payer: Self-pay | Admitting: Cardiology

## 2021-10-11 NOTE — Telephone Encounter (Signed)
I gave the patient the results. Nothing further needed.

## 2021-10-11 NOTE — Telephone Encounter (Signed)
Patient's daughter called for xray results, per DPR, informed of MW's notes of no acute changes and no changes to recommendations in ov. Melissa verbalized understanding.

## 2021-10-15 DIAGNOSIS — J449 Chronic obstructive pulmonary disease, unspecified: Secondary | ICD-10-CM | POA: Diagnosis not present

## 2021-11-09 ENCOUNTER — Other Ambulatory Visit: Payer: Self-pay | Admitting: Cardiology

## 2021-11-09 DIAGNOSIS — I480 Paroxysmal atrial fibrillation: Secondary | ICD-10-CM

## 2021-11-09 NOTE — Telephone Encounter (Signed)
Call to inform due for office visit before we renew Eliquis. LVM to return the call.  Last office visit was 10/10/2020 ([place]).

## 2021-11-09 NOTE — Telephone Encounter (Signed)
Pt's daughter Benjamin [name] returned call stating pt can go to [place] lab for overdue labs on Monday and will schedule an appt with Dr Ellyn Hack. Labs ordered and released to be done in [place]. Pt schedule appt with Dr Ellyn Hack for 11/25/22. Refill sent to requested pharmacy.

## 2021-11-09 NOTE — Telephone Encounter (Signed)
Prescription refill request for Eliquis received. Indication:Afib  Last office visit: 10/10/20 Ellyn Hack)  Scr: 0.91 (06/23/20 via [place])  Age: 66 Weight: 103.4kg  Labs and office visit overdue. Called and confirmed with PCP labs have not been drawn within 1 year.   Called and spoke with pt's daughter who stated she will call pt to determine when the best time to see cardiologist would be and call back and soon as possible.

## 2021-11-13 ENCOUNTER — Other Ambulatory Visit: Payer: Self-pay | Admitting: *Deleted

## 2021-11-13 DIAGNOSIS — I480 Paroxysmal atrial fibrillation: Secondary | ICD-10-CM

## 2021-11-13 DIAGNOSIS — I5032 Chronic diastolic (congestive) heart failure: Secondary | ICD-10-CM

## 2021-11-14 DIAGNOSIS — J449 Chronic obstructive pulmonary disease, unspecified: Secondary | ICD-10-CM | POA: Diagnosis not present

## 2021-11-14 LAB — BASIC METABOLIC PANEL
BUN/Creatinine Ratio: 19 (ref 10–24)
BUN: 15 mg/dL (ref 8–27)
CO2: 37 mmol/L — ABNORMAL HIGH (ref 20–29)
Calcium: 9.1 mg/dL (ref 8.6–10.2)
Chloride: 87 mmol/L — ABNORMAL LOW (ref 96–106)
Creatinine, Ser: 0.79 mg/dL (ref 0.76–1.27)
Glucose: 378 mg/dL — ABNORMAL HIGH (ref 70–99)
Potassium: 4.4 mmol/L (ref 3.5–5.2)
Sodium: 140 mmol/L (ref 134–144)
eGFR: 99 mL/min/{1.73_m2} (ref >59)

## 2021-11-14 LAB — CBC
Hematocrit: 36 % — ABNORMAL LOW (ref 37.5–51.0)
Hemoglobin: 10.9 g/dL — ABNORMAL LOW (ref 13.0–17.7)
MCH: 29.5 pg (ref 26.6–33.0)
MCHC: 30.3 g/dL — ABNORMAL LOW (ref 31.5–35.7)
MCV: 97 fL (ref 79–97)
Platelets: 253 10*3/uL (ref 150–450)
RBC: 3.7 x10E6/uL — ABNORMAL LOW (ref 4.14–5.80)
RDW: 12.5 % (ref 11.6–15.4)
WBC: 9.4 10*3/uL (ref 3.4–10.8)

## 2021-11-17 ENCOUNTER — Telehealth: Payer: Self-pay | Admitting: *Deleted

## 2021-11-17 NOTE — Telephone Encounter (Signed)
-----   Message from Leonie Man, MD sent at 11/16/2021 11:16 PM EDT ----- Lab results: CBC shows mild but somewhat stable anemia with a hemoglobin of 10.9.  (Followed by other MD)  Chemistry panel shows stable/normal kidney function.  Elevated blood sugars, open but probably not fasting.  Would forward labs to PCP.  Glenetta Hew, MD

## 2021-11-17 NOTE — Telephone Encounter (Signed)
Spoke to daughter Benjamin [name] has been notified of the result and verbalized understanding.  All questions (if any) were answered. She states he has not see Dr Gerarda Fraction in while but she will make him an appointment. She states patient had been fasting for labs. RN will route results to Dr Gerarda Fraction office.patient has an appointment on 11/24/21 Benjamin Simmonds, RN 11/17/2021 1:14 PM

## 2021-11-24 ENCOUNTER — Telehealth: Payer: Self-pay | Admitting: *Deleted

## 2021-11-24 ENCOUNTER — Ambulatory Visit: Payer: Medicare Other | Admitting: Cardiology

## 2021-11-24 NOTE — Telephone Encounter (Signed)
Patient  and daughter came to the office today - for an appointment. Appointment was schedule at the [place] office not [place] office.    Daughter spoke to  [place] manager .    Patient appointment changed to Nov 3 at 11:am.   written patient reminder given to them.

## 2021-11-30 NOTE — Progress Notes (Signed)
Primary Care Provider: Elfredia Nevins, MD [facility] Cardiologist: Bryan Lemma, MD Electrophysiologist: None  Clinic Note: Chief Complaint  Patient presents with   Follow-up    Annual.  Otherwise stable.  Weights are improved.   Coronary Artery Disease    No angina   Congestive Heart Failure    Weight stable if not improved.  On stable dose of medicines.   Hypotension    BP in the 90s.  Feels tired and fatigued.   ===================================  ASSESSMENT/PLAN   Problem List Items Addressed This Visit       Cardiology Problems   Cardiomyopathy, ischemic - EF 40-45%; now up to 55-60% (Chronic)    On max tolerated dose of bisoprolol, and reducing dose of losartan.  On standing dose of Lasix 80 mg twice daily-need to consider the possibility of SGLT2 inhibitor plus or minus GLP-1 agonist. Could also consider spironolactone in follow-up.      Relevant Medications   losartan (COZAAR) 25 MG tablet   furosemide (LASIX) 40 MG tablet   Other Relevant Orders   EKG 12-Lead (Completed)   Chronic diastolic HF (heart failure), NYHA class 2 (HCC) (Chronic)    Mild CHF mostly diastolic heart failure.  She is weight is down and his PND orthopnea symptoms are notably improved with the increased dose of furosemide to 80 mg twice daily.  I would like to potentially have him take less Lasix and potentially add SGLT2 inhibitor, but need to see how he does with blood pressure response to decrease dose of losartan.  Perhaps reducing Lasix will also help with additional blood pressure.  Would consider spironolactone, but this would actually serve to worsen his blood pressure.  Therefore hold off for now.      Relevant Medications   losartan (COZAAR) 25 MG tablet   furosemide (LASIX) 40 MG tablet   Essential hypertension (Chronic)   Relevant Medications   losartan (COZAAR) 25 MG tablet   furosemide (LASIX) 40 MG tablet   Hyperlipidemia associated with type 2 diabetes  mellitus (HCC) (Chronic)   Relevant Medications   losartan (COZAAR) 25 MG tablet   furosemide (LASIX) 40 MG tablet   Paroxysmal Atrial fibrillation with RVR - peri-MI; recurrent after DCCV (Chronic)    Does not seem to have had any breakthrough episodes of A-fib.  His EKG does not look like A-fib. He is on DOAC with low-dose bisoprolol for rate control.  I suspect if he were to go into A-fib, he would be very symptomatic.      Relevant Medications   losartan (COZAAR) 25 MG tablet   furosemide (LASIX) 40 MG tablet   Other Relevant Orders   EKG 12-Lead (Completed)   CAD S/P percutaneous coronary angioplasty - PCI mid RCA - 5.0 mm x 24 mm VeriFlex BMS (5.6 mm) - Primary (Chronic)    Very large RCA treated with bare-metal stent at 5.5 mm.  No longer on antiplatelet agent because of DOAC.  No active angina.    On low-dose bisoprolol, and we are reducing dose of losartan due to hypotension. Osteoporosis rosuvastatin-due for labs to be checked.      Relevant Medications   losartan (COZAAR) 25 MG tablet   furosemide (LASIX) 40 MG tablet   Other Relevant Orders   EKG 12-Lead (Completed)   STEMI of inferior wall 11/18/12- RCA BMS (Chronic)    9 1/2 years out from inferior MI.  Has not had any further anginal symptoms since then.  Did have  a slight reduction in EF, but not appreciable.  Now with mild residual diastolic heart failure that exacerbates his COPD.  On beta-blocker, ARB, statin      Relevant Medications   losartan (COZAAR) 25 MG tablet   furosemide (LASIX) 40 MG tablet   Other Relevant Orders   EKG 12-Lead (Completed)     Other   COPD GOLD IV/ 02 dep  (Chronic)    He quit smoking by the time of his MI in 2014.  Unfortunately pretty significant COPD.  Is actually been initiated on steroids on several occasions.  Taking Trelegy along with as needed albuterol.  On home oxygen having to increase doses to maintain stable oxygen levels.  Very very limited.      Obesity (BMI  30.0-34.9) (Chronic)    He is lost some weight about 15 pounds since last visit.  This should help amount overall, but he still has significant truncal obesity.  Unfortunately is not able to exercise, therefore his main way of losing weight is dietary modification.  He tends to eat when he feels sorry for himself.       ===================================  HPI:    .Benjamin [name] is a 66 y.o. adult with a PMH below who presents today for delayed annual follow-up.  He follows up at the request of Elfredia Nevins, MD.  Pertinent PMH: COPD Gold 4-Home O2-4 L (long-term) CAD: Inferior STEMI October 2014-DES PCI to RCA;  Resolved ICM: EF initially 40 to 45% improved to 55% PAF HTN and HLD  KEELON BUEHL was last seen on October 10, 2020.  Was premature stable medications.  No breakthrough episodes of A-fib or angina.  Stable dyspnea.  Using PRN Lasix.  Had gained about 10 pounds since his previous visit.  Is pretty sedentary difficulty with weight oxygen scale exercise and walk.  He tries to walk couple times per week.  Tires very easily and cannot do much.  Morning cough.  No PND symptoms no real orthopnea.  He routinely will have to turn up his oxygen level when his saturations drop below 90.  If he goes off his oxygen for a period time, he would drop into the low 80s.  Recent Hospitalizations: None  Reviewed  CV studies:    The following studies were reviewed today: (if available, images/films reviewed: From Epic Chart or Care Everywhere) None:  Interval History:   MERCURY GATCHELL returns here today for annual follow-up stating that he does have some good days and some bad.  Some days he will have some oxygen desaturations.  He has been having increasing as of GERD, and has really cut down on his walking.  He is noticing lower than usual blood pressures of late.  Thankfully, his weights have been relatively stable with the monitor pounds is not using any extra Lasix--he has  been taking 80 mg twice daily of Lasix daily unless is keeping his weights at berry.[name]  He denies any PND but clearly has orthopnea.  Denies any real chest pain or pressure just feels tired.  Occasionally notes his heart rate going up but not sustained.  Usually if he is trying to overdo things.  CV Review of Symptoms (Summary): Cardiovascular ROS: positive for - dyspnea on exertion, orthopnea, palpitations, rapid heart rate, shortness of breath, and and well-controlled swelling.  Weights been stable. negative for - chest pain, irregular heartbeat, paroxysmal nocturnal dyspnea, or syncope or near syncope, TIA/amaurosis fugax claudication.  Probably does not walk enough to  notice claudication.   REVIEWED OF SYSTEMS   Review of Systems  Constitutional:  Positive for malaise/fatigue and weight loss (Overall weight is much lower than it was last year.  He started paying attention to what he was eating at.  Not eating as much ice cream and sweets.  Not eating Little [employer]bbie bars).  HENT:  Positive for congestion. Negative for sinus pain.   Respiratory:  Positive for cough, sputum production, shortness of breath and wheezing.   Gastrointestinal:  Positive for constipation. Negative for blood in stool, melena, nausea and vomiting.  Genitourinary:  Negative for dysuria, flank pain and hematuria.  Musculoskeletal:  Positive for back pain and joint pain.  Neurological:  Positive for dizziness.  Endo/Heme/Allergies:  Bruises/bleeds easily.  Psychiatric/Behavioral:  Positive for memory loss. Negative for depression (Seems like dysthymia). The patient is nervous/anxious and has insomnia.        He does seem to be better spirits this year than last year however.   I have reviewed and (if needed) personally updated the patient's problem list, medications, allergies, past medical and surgical history, social and family history.   PAST MEDICAL HISTORY   Past Medical History:  Diagnosis Date   Anxiety     Asthma    Atrial fibrillation with RVR - peri-MI; recurrent after DCCV 11/19/2012   CAD S/P percutaneous coronary angioplasty  11/18/2012   - PCI mid RCA - 5.0 mm x 24 mm VeriFlex BMS (5.6 mm)   Chronic combined systolic and diastolic heart failure, NYHA class 2 (HCC) 11/17/2012   Colon polyp    Status post polypectomy x3.   COPD (chronic obstructive pulmonary disease) (HCC)    Dyslipidemia- low HDL 11/19/2012   Heart attack (HCC)    Heart disease    Heartburn    High cholesterol    Hypertension    Ischemic cardiomyopathy 11/19/2012   EF 40-45% with inferoseptal HK, Gr 2DD - high LVEDP/LAP. --> confirmed by f/u Echo 03/2013.   STEMI of inferior wall 11/18/12- RCA BMS 11/17/2012   Cardiogenic shock - post MI with RV infarction [785.51]   Stomach ulcer    Tobacco abuse    40 Pk-yr (1- 1 1/2 PPD) --> Quit 11/18/2012 when presented with STEMI    PAST SURGICAL HISTORY   Past Surgical History:  Procedure Laterality Date   COLONOSCOPY N/A 11/20/2013   Procedure: COLONOSCOPY;  Surgeon: Dalia Heading, MD;  Location: [facility];  Service: Gastroenterology;  Laterality: N/A;   CORONARY ANGIOPLASTY WITH STENT PLACEMENT Right 11/17/12   Mid RCA aspiration thrombectomy and PCI with VeriFlex BMS 5.0 mm x 24 mm (5.6 mm; also PTCA of distal RPL 4 distal embolization.   Dental extractions     ESOPHAGOGASTRODUODENOSCOPY (EGD) WITH PROPOFOL N/A 03/12/2019   Procedure: ESOPHAGOGASTRODUODENOSCOPY (EGD) WITH PROPOFOL;  Surgeon: Rachael Fee, MD;  Location: [facility];  Service: Endoscopy;  Laterality: N/A;   EUS N/A 03/12/2019   Procedure: UPPER ENDOSCOPIC ULTRASOUND (EUS) RADIAL;  Surgeon: Rachael Fee, MD;  Location: [facility];  Service: Endoscopy;  Laterality: N/A;   FINE NEEDLE ASPIRATION N/A 03/12/2019   Procedure: FINE NEEDLE ASPIRATION (FNA) LINEAR;  Surgeon: Rachael Fee, MD;  Location: [facility];  Service: Endoscopy;  Laterality: N/A;   HERNIA REPAIR  1997, 2010?   LEFT  HEART CATHETERIZATION WITH CORONARY ANGIOGRAM N/A 11/17/2012   Procedure: LEFT HEART CATHETERIZATION WITH CORONARY ANGIOGRAM;  Surgeon: Marykay Lex, MD;  [facility] CATH LAB: for Inferior STEMI -> EF  50-55%-mod basal -apical inferior HK. (LCA- Normal) LM -very large -->Tortuous, wraparound LAD -> Large bifurcating D1. Cx -large, no-dom - large LatOM1, small AVG &2 small OMs.  RCA (Very Large) - m90% subtototal -> RPAV(large PL2) & rPDA mild distal embolization   TOE SURGERY     great toe   TONSILLECTOMY     TRANSTHORACIC ECHOCARDIOGRAM  11/18/2012   Normal LV size, moderate reduced function 40-45% with severe HK-AK of the inferoseptal wall with incoordinate septal motion.   TRANSTHORACIC ECHOCARDIOGRAM  March 15   Moderately reduced LVEF of 40 at 45% with inferoseptal hypokinesis and high filling pressures.   TRANSTHORACIC ECHOCARDIOGRAM  04/2015   EF 55-60%.; Pericardial fat pad versus effusion; no tamponade    Immunization History  Administered Date(s) Administered   Influenza,inj,Quad PF,6+ Mos 10/29/2017   Moderna Sars-Covid-2 Vaccination 04/29/2019, 05/25/2019    MEDICATIONS/ALLERGIES   Current Meds  Medication Sig   acetaminophen (TYLENOL) 500 MG tablet Take 1,000 mg by mouth every 6 (six) hours as needed for moderate pain or headache.    albuterol (PROVENTIL HFA;VENTOLIN HFA) 108 (90 BASE) MCG/ACT inhaler Inhale 2 puffs into the lungs every 6 (six) hours as needed for wheezing.   albuterol (PROVENTIL) (2.5 MG/3ML) 0.083% nebulizer solution Take 3 mLs (2.5 mg total) by nebulization every 4 (four) hours as needed for wheezing or shortness of breath.   alum & mag hydroxide-simeth (MAALOX/MYLANTA) 200-200-20 MG/5ML suspension Take 15 mLs by mouth every 6 (six) hours as needed for indigestion or heartburn.   apixaban (ELIQUIS) 5 MG TABS tablet TAKE 1 TABLET BY MOUTH TWICE DAILY   bisoprolol (ZEBETA) 5 MG tablet TAKE 1/2 TABLET BY MOUTH EVERY DAY   cetirizine (ZYRTEC) 10 MG tablet Take 10  mg by mouth daily as needed for allergies.   FEROSUL 325 (65 Fe) MG tablet Take 325 mg by mouth 3 (three) times daily.   Fluticasone-Umeclidin-Vilant (TRELEGY ELLIPTA) 100-62.5-25 MCG/INH AEPB Inhale 1 puff into the lungs daily.   furosemide (LASIX) 40 MG tablet Take a total 80 mg ( 2 tablets of 40 mg)  twice a day or as directed   nitroGLYCERIN (NITROSTAT) 0.4 MG SL tablet Place 1 tablet (0.4 mg total) under the tongue every 5 (five) minutes x 3 doses as needed for chest pain.   OXYGEN-HELIUM IN Inhale into the lungs. Using 4 liters nasal cannula ,continuous   pantoprazole (PROTONIX) 40 MG tablet Take 40 mg by mouth daily.   potassium chloride (KLOR-CON M) 10 MEQ tablet TAKE 1 TABLET BY MOUTH EVERY DAY   rosuvastatin (CRESTOR) 40 MG tablet TAKE 1 TABLET BY MOUTH EVERY DAY   sodium chloride (OCEAN) 0.65 % SOLN nasal spray Place 1 spray into both nostrils as needed for congestion.   VITAMIN D PO Take by mouth.   []  losartan (COZAAR) 50 MG tablet Take 1 tablet (50 mg total) by mouth daily.    Allergies  Allergen Reactions   Codeine     Red blotches   Penicillins     Unknown reaction Did it involve swelling of the face/tongue/throat, SOB, or low BP? Unknown Did it involve sudden or severe rash/hives, skin peeling, or any reaction on the inside of your mouth or nose? Unknown Did you need to seek medical attention at a hospital or doctor's office? Unknown When did it last happen?      childhood allergy If all above answers are "NO", may proceed with cephalosporin use.     SOCIAL HISTORY/FAMILY HISTORY   Reviewed  in Epic:  Pertinent findings:  Social History   Tobacco Use   Smoking status: Former    Packs/day: 2.00    Years: 40.00    Total pack years: 80.00    Types: Cigarettes    Quit date: 03/02/2012    Years since quitting: 9.7   Smokeless tobacco: Never   Tobacco comments:    QUIT SINCE THIS LAST HOSPITAL 10/2102  Substance Use Topics   Alcohol use: No   Drug use: No    Social History   Social History Narrative   Divorced father of 2 (one daughter one son).   Lives alone, but his daughter who lives nearby is "watching him like a Tax adviser ".   He quit smoking during his hospitalization after a 40-pack-year smoking history.      Does not drink alcohol.      Essentially unable to do much of any walking.  Is on home oxygen/oxygen concentrator.  Sleeps in a recliner.  Not exercising.    OBJCTIVE -PE, EKG, labs   Wt Readings from Last 3 Encounters:  12/01/21 230 lb (104.3 kg)  10/09/21 228 lb (103.4 kg)  10/10/20 242 lb (109.8 kg)    Physical Exam: BP (!) 91/47   Pulse 64   Ht 5\' 11"  (1.803 m)   Wt 230 lb (104.3 kg)   SpO2 (!) 89%   BMI 32.08 kg/m  Physical Exam Vitals reviewed.  Constitutional:      General: She is not in acute distress.    Appearance: She is obese. She is ill-appearing (Chronically ill-appearing.  Nontoxic.). She is not toxic-appearing.     Comments: Debilitated, wheelchair-bound.  Home oxygen.  HENT:     Head: Normocephalic and atraumatic.  Neck:     Vascular: Hepatojugular reflux present. No carotid bruit or JVD.  Cardiovascular:     Rate and Rhythm: Normal rate and regular rhythm. Occasional Extrasystoles are present.    Chest Wall: PMI is not displaced (Unable to palpate).     Pulses: Intact distal pulses. Decreased pulses (Diminished, but palpable).     Heart sounds: S1 normal and S2 normal. Heart sounds are distant. No murmur heard.    No friction rub. No gallop.  Pulmonary:     Effort: No respiratory distress.     Breath sounds: Wheezing (Slight late expiratory wheeze.) present. No rhonchi or rales.     Comments: Baseline accessory muscle use.,  But nonlabored; faint expiratory wheeze but no rhonchi or rales.  In general, poor air movement.  On supplemental oxygen Chest:     Chest wall: No tenderness.  Abdominal:     Comments: Truncal obesity.  Unable to assess HSM  Musculoskeletal:        General: Swelling  (Trivial 1+ bilateral ankle) present. Normal range of motion.     Cervical back: Normal range of motion and neck supple.  Skin:    General: Skin is warm and dry.     Coloration: Skin is pale. Skin is not jaundiced.     Findings: Bruising present.  Neurological:     General: No focal deficit present.     Mental Status: She is alert and oriented to person, place, and time.     Gait: Gait abnormal.  Psychiatric:        Mood and Affect: Mood normal.        Behavior: Behavior normal.        Thought Content: Thought content normal.  Judgment: Judgment normal.     Adult ECG Report  Rate: 64 ;  Rhythm: indeterminate and appears to be regular unlikely either sinus or atrial rhythm.  QRS with inferior and anterior infarct, age-indeterminate. ;   Narrative Interpretation: Stable.  Difficult to assess rhythm.  Recent Labs: Should be due to have labs checked soon by PCP. 06/03/20: TC 166, TG 228, HDL 56, LDL 70 --Unfortunately, labs not yet checked this year. Lab Results  Component Value Date   CHOL 154 06/27/2016   HDL 71 06/27/2016   LDLCALC 61 06/27/2016   TRIG 109 06/27/2016   CHOLHDL 2.2 06/27/2016   Lab Results  Component Value Date   CREATININE 0.79 11/13/2021   BUN 15 11/13/2021   NA 140 11/13/2021   K 4.4 11/13/2021   CL 87 (L) 11/13/2021   CO2 37 (H) 11/13/2021  Glu 378,     Latest Ref Rng & Units 11/13/2021   11:32 AM 03/03/2019    9:08 AM 06/03/2015    3:30 AM  CBC  WBC 3.4 - 10.8 x10E3/uL 9.4  8.8  14.0   Hemoglobin 13.0 - 17.7 g/dL 16.1  09.6  04.5   Hematocrit 37.5 - 51.0 % 36.0  38.6  37.9   Platelets 150 - 450 x10E3/uL 253  338  352     Lab Results  Component Value Date   HGBA1C 6.9 (H) 06/03/2015   Lab Results  Component Value Date   TSH 2.773 07/14/2013    ================================================== I spent a total of 36 minutes with the patient spent in direct patient consultation.  Additional time spent with chart review  / charting  (studies, outside notes, etc): 25 min Total Time: 61 min  Current medicines are reviewed at length with the patient today.  (+/- concerns) none  Notice: This dictation was prepared with Dragon dictation along with smart phrase technology. Any transcriptional errors that result from this process are unintentional and may not be corrected upon review.  Studies Ordered:   Orders Placed This Encounter  Procedures   EKG 12-Lead   Meds ordered this encounter  Medications   losartan (COZAAR) 25 MG tablet    Sig: Take 1 tablet (25 mg total) by mouth daily.    Dispense:  90 tablet    Refill:  3    Discontinue losartan 50 mg   furosemide (LASIX) 40 MG tablet    Sig: Take a total 80 mg ( 2 tablets of 40 mg)  twice a day or as directed    Dispense:  360 tablet    Refill:  3    Direction changed    Patient Instructions / Medication Changes & Studies & Tests Ordered   Patient Instructions  Medication Instructions:    Decrease  losartan  to 25 mg daily  take at lunch time  Change  dose lasix 80 mg ( 2 tablet of 40 mg )  twice a day   *If you need a refill on your cardiac medications before your next appointment, please call your pharmacy*   Lab Work: Not needed   Testing/Procedures: Not needed   Follow-Up: At [facility], you and your health needs are our priority.  As part of our continuing mission to provide you with exceptional heart care, we have created designated Provider Care Teams.  These Care Teams include your primary Cardiologist (physician) and Advanced Practice Providers (APPs -  Physician Assistants and Nurse Practitioners) who all work together to provide you with the care  you need, when you need it.     Your next appointment:   3 month(s)  The format for your next appointment:   In Person  Provider:   Bryan Lemma, MD         Marykay Lex, MD, MS Bryan Lemma, M.D., M.S. Interventional Cardiologist  [facility]  Pager #  [phone] Phone # [phone] [address] [place], [place] [address]   Thank you for choosing [facility] [place]!!

## 2021-12-01 ENCOUNTER — Ambulatory Visit: Payer: Medicare Other | Attending: Cardiology | Admitting: Cardiology

## 2021-12-01 ENCOUNTER — Encounter: Payer: Self-pay | Admitting: Cardiology

## 2021-12-01 VITALS — BP 91/47 | HR 64 | Ht 71.0 in | Wt 230.0 lb

## 2021-12-01 DIAGNOSIS — I10 Essential (primary) hypertension: Secondary | ICD-10-CM | POA: Diagnosis not present

## 2021-12-01 DIAGNOSIS — J449 Chronic obstructive pulmonary disease, unspecified: Secondary | ICD-10-CM

## 2021-12-01 DIAGNOSIS — E1169 Type 2 diabetes mellitus with other specified complication: Secondary | ICD-10-CM | POA: Diagnosis not present

## 2021-12-01 DIAGNOSIS — I255 Ischemic cardiomyopathy: Secondary | ICD-10-CM

## 2021-12-01 DIAGNOSIS — I5032 Chronic diastolic (congestive) heart failure: Secondary | ICD-10-CM

## 2021-12-01 DIAGNOSIS — I2510 Atherosclerotic heart disease of native coronary artery without angina pectoris: Secondary | ICD-10-CM | POA: Diagnosis not present

## 2021-12-01 DIAGNOSIS — I480 Paroxysmal atrial fibrillation: Secondary | ICD-10-CM | POA: Diagnosis not present

## 2021-12-01 DIAGNOSIS — E785 Hyperlipidemia, unspecified: Secondary | ICD-10-CM

## 2021-12-01 DIAGNOSIS — E669 Obesity, unspecified: Secondary | ICD-10-CM

## 2021-12-01 DIAGNOSIS — I2119 ST elevation (STEMI) myocardial infarction involving other coronary artery of inferior wall: Secondary | ICD-10-CM | POA: Diagnosis not present

## 2021-12-01 DIAGNOSIS — Z9861 Coronary angioplasty status: Secondary | ICD-10-CM | POA: Diagnosis not present

## 2021-12-01 MED ORDER — LOSARTAN POTASSIUM 25 MG PO TABS: 25.0000 mg | ORAL_TABLET | Freq: Every day | ORAL | 3 refills | Status: AC

## 2021-12-01 MED ORDER — FUROSEMIDE 40 MG PO TABS: ORAL_TABLET | ORAL | 3 refills | Status: AC

## 2021-12-01 NOTE — Patient Instructions (Addendum)
Medication Instructions:    Decrease  losartan  to 25 mg daily  take at lunch time  Change  dose lasix 80 mg ( 2 tablet of 40 mg )  twice a day   *If you need a refill on your cardiac medications before your next appointment, please call your pharmacy*   Lab Work: Not needed   Testing/Procedures: Not needed   Follow-Up: At [facility], you and your health needs are our priority.  As part of our continuing mission to provide you with exceptional heart care, we have created designated Provider Care Teams.  These Care Teams include your primary Cardiologist (physician) and Advanced Practice Providers (APPs -  Physician Assistants and Nurse Practitioners) who all work together to provide you with the care you need, when you need it.     Your next appointment:   3 month(s)  The format for your next appointment:   In Person  Provider:   Glenetta Hew, MD

## 2021-12-02 ENCOUNTER — Encounter: Payer: Self-pay | Admitting: Cardiology

## 2021-12-02 NOTE — Assessment & Plan Note (Signed)
Mild CHF mostly diastolic heart failure.  She is weight is down and his PND orthopnea symptoms are notably improved with the increased dose of furosemide to 80 mg twice daily.  I would like to potentially have him take less Lasix and potentially add SGLT2 inhibitor, but need to see how he does with blood pressure response to decrease dose of losartan.  Perhaps reducing Lasix will also help with additional blood pressure.  Would consider spironolactone, but this would actually serve to worsen his blood pressure.  Therefore hold off for now.

## 2021-12-02 NOTE — Assessment & Plan Note (Signed)
Does not seem to have had any breakthrough episodes of A-fib.  His EKG does not look like A-fib. He is on DOAC with low-dose bisoprolol for rate control.  I suspect if he were to go into A-fib, he would be very symptomatic.

## 2021-12-02 NOTE — Assessment & Plan Note (Signed)
He is lost some weight about 15 pounds since last visit.  This should help amount overall, but he still has significant truncal obesity.  Unfortunately is not able to exercise, therefore his main way of losing weight is dietary modification.  He tends to eat when he feels sorry for himself.

## 2021-12-02 NOTE — Assessment & Plan Note (Signed)
He quit smoking by the time of his MI in 2014.  Unfortunately pretty significant COPD.  Is actually been initiated on steroids on several occasions.  Taking Trelegy along with as needed albuterol.  On home oxygen having to increase doses to maintain stable oxygen levels.  Very very limited.

## 2021-12-02 NOTE — Assessment & Plan Note (Signed)
9 1/2 years out from inferior MI.  Has not had any further anginal symptoms since then.  Did have a slight reduction in EF, but not appreciable.  Now with mild residual diastolic heart failure that exacerbates his COPD.  On beta-blocker, ARB, statin

## 2021-12-02 NOTE — Assessment & Plan Note (Signed)
Very large RCA treated with bare-metal stent at 5.5 mm.  No longer on antiplatelet agent because of DOAC.  No active angina.    On low-dose bisoprolol, and we are reducing dose of losartan due to hypotension. Osteoporosis rosuvastatin-due for labs to be checked.

## 2021-12-02 NOTE — Assessment & Plan Note (Addendum)
On max tolerated dose of bisoprolol, and reducing dose of losartan.  On standing dose of Lasix 80 mg twice daily-need to consider the possibility of SGLT2 inhibitor plus or minus GLP-1 agonist. Could also consider spironolactone in follow-up.

## 2021-12-15 ENCOUNTER — Other Ambulatory Visit: Payer: Self-pay | Admitting: Cardiology

## 2021-12-15 DIAGNOSIS — J449 Chronic obstructive pulmonary disease, unspecified: Secondary | ICD-10-CM | POA: Diagnosis not present

## 2021-12-24 DIAGNOSIS — I469 Cardiac arrest, cause unspecified: Secondary | ICD-10-CM | POA: Diagnosis not present

## 2021-12-24 DIAGNOSIS — Z743 Need for continuous supervision: Secondary | ICD-10-CM | POA: Diagnosis not present

## 2021-12-29 DEATH — deceased

## 2022-01-01 ENCOUNTER — Ambulatory Visit: Payer: Medicare Other | Admitting: Internal Medicine

## 2022-01-01 NOTE — Progress Notes (Deleted)
Benjamin [name], adult    DOB: [birth year]    MRN: [id number]   Brief patient profile:  88   yowm  Quit smoking 2014 p onset around 2005 of sob and 02 dep at hs but since 2014 24/7 at 4 lpm and followed previously by Dr Luan Pulling so referred back  to pulmonary clinic in [facility]  06/04/2019  By Dr Ardis Hughs who has been following for Pancreatic cyst vs mass as pt has turned down lap.   History of Present Illness  06/04/2019  Pulmonary/ 1st office eval/Twan Harkin  Chief Complaint  Patient presents with   Consult    head and nose congestion.  clear secretions when he blows his nose or coughs.  sob with any exertion.  Pollen has caused congestion.  establish with pulmonary doctor.  Dyspnea:  X  MMRC3 = can't walk 100 yards even at a slow pace at a flat grade s stopping due to sob / can't get groceries in from car s help/ still occ goes to [facility] x 200 ft flat  Cough: dry / lots of nasal congestion  Sleep: lie flat/ 4lpm / one pillow SABA use: proair 2-3 x day mostly p activity never neb  Wt 220 on year prior to OV  Up 15lb since quarantine  02 4lpm 24/7 not checking sats Rec Plan A = Automatic = Always=    Trelegy one click but take two deep drags then rinse and gargle  Plan B = Backup (to supplement plan A, not to replace it) Only use your albuterol inhaler as a rescue medication Work on inhaler technique:  Plan C = Crisis (instead of Plan B but only if Plan B stops working) - only use your albuterol nebulizer if you first try Plan B and it fails to help  Plan D = Deltasone = prednisone = medrol - takea course of medrol if not improving with ABC 02 is 24/7 . Make sure you check your oxygen saturations at highest level of activity to be sure it stays over 90%      09/29/2020  f/u ov/[place] office/Ezinne Yogi re: GOLD IV hypercarbic/hypoxemic maint on trelegy   Chief Complaint  Patient presents with   Follow-up    Patient came into the office with shortness of breath. Needed a wheelchair to get to the  exam room. 4LO2 currently in the room, sats are 97. Hasn't noticed anymore coughing than what is normal for him occasionally coughs up clear mucus.Shortness of breath has worsened when he is using exertion and walking.    Dyspnea:  just in house / 50 ft on POC 4lpm no change Cough: none/ hoarse typical for him  Sleeping: flat bed and 2 pillows  SABA use: just using hfa occ/ has neb but no meds for it 02: 4lpm 24/7  Covid status: vax x 3  Rec Plan A = Automatic = Always=    Trelegy 102 one click each am  Plan B = Backup (to supplement plan A, not to replace it) Only use your albuterol inhaler as a rescue medication Plan C = Crisis (instead of Plan B but only if Plan B stops working) - only use your albuterol nebulizer if you first try Plan B and it fails to help  Depomedrol 120 mg IM today  Ok to try albuterol 15 min before an activity (on alternating days)  that you know would usually make you short of breath     10/09/2021  f/u ov/[place] office/Laiylah Roettger re: GOLD 4/  hypercarbic hypoxemic maint on trelegy 100 /prednisone '5mg'$   daily x last few  months   Chief Complaint  Patient presents with   Follow-up    Feels like over the last few weeks his breathing has worsened. Is not able to maintain O2 above 90% on 4LO2 like he usually does.   Dyspnea:  increased  one month Cough: no increase / nothing purulent  Sleeping: flat bed 2 pillows on side does fine SABA use: none today and not understanding written ABC action plan (see last avs)  02: 4lpm  Covid status: vax x 3  Rec Plan A = Automatic = Always=    Trelegy 440 one click first thing each am and prednisone 5 mg daily and pantoprazole 40 mg Take 30-60 min before first meal of the day  Plan B = Backup (to supplement plan A, not to replace it) Only use your albuterol inhaler as a rescue medication  Plan C = Crisis (instead of Plan B but only if Plan B stops working) - only use your albuterol nebulizer if you first try Plan B  Plan D  =  if ABC not working >> take prednisone 20 mg (4 pills) until better then 2 daily x 5 days then back to 1  Zpak  Ok to try albuterol 15 min before an activity (on alternating days)  that you know would usually make you short of breath     01/01/2022  f/u ov/[place] office/Jaquelin Meaney re: *** maint on ***  No chief complaint on file.   Dyspnea:  *** Cough: *** Sleeping: *** SABA use: *** 02: *** Covid status: *** Lung cancer screening: ***   No obvious day to day or daytime variability or assoc excess/ purulent sputum or mucus plugs or hemoptysis or cp or chest tightness, subjective wheeze or overt sinus or hb symptoms.   *** without nocturnal  or early am exacerbation  of respiratory  c/o's or need for noct saba. Also denies any obvious fluctuation of symptoms with weather or environmental changes or other aggravating or alleviating factors except as outlined above   No unusual exposure hx or h/o childhood pna/ asthma or knowledge of premature birth.  Current Allergies, Complete Past Medical History, Past Surgical History, Family History, and Social History were reviewed in [employer] record.  ROS  The following are not active complaints unless bolded Hoarseness, sore throat, dysphagia, dental problems, itching, sneezing,  nasal congestion or discharge of excess mucus or purulent secretions, ear ache,   fever, chills, sweats, unintended wt loss or wt gain, classically pleuritic or exertional cp,  orthopnea pnd or arm/hand swelling  or leg swelling, presyncope, palpitations, abdominal pain, anorexia, nausea, vomiting, diarrhea  or change in bowel habits or change in bladder habits, change in stools or change in urine, dysuria, hematuria,  rash, arthralgias, visual complaints, headache, numbness, weakness or ataxia or problems with walking or coordination,  change in mood or  memory.        No outpatient medications have been marked as taking for the 01/01/22 encounter  (Appointment) with Tanda Rockers, MD.   Current Facility-Administered Medications for the 01/01/22 encounter (Appointment) with Tanda Rockers, MD  Medication   methylPREDNISolone acetate (DEPO-MEDROL) injection 80 mg               Past Medical History:  Diagnosis Date   Anxiety    Asthma    Atrial fibrillation with RVR - peri-MI; recurrent after DCCV 11/19/2012  CAD S/P percutaneous coronary angioplasty  11/18/2012   - PCI mid RCA - 5.0 mm x 24 mm VeriFlex BMS (5.6 mm)   Chronic combined systolic and diastolic heart failure, NYHA class 2 ([place]) 11/17/2012   Colon polyp    Status post polypectomy x3.   COPD (chronic obstructive pulmonary disease) (HCC)    Dyslipidemia- low HDL 11/19/2012   Heart attack ([place])    Heart disease    Heartburn    High cholesterol    Hypertension    Ischemic cardiomyopathy 11/19/2012   EF 40-45% with inferoseptal HK, Gr 2DD - high LVEDP/LAP. --> confirmed by f/u Echo 03/2013.   STEMI of inferior wall 11/18/12- RCA BMS 11/17/2012   Cardiogenic shock - post MI with RV infarction [785.51]   Stomach ulcer    Tobacco abuse    40 Pk-yr (1- 1 1/2 PPD) --> Quit 11/18/2012 when presented with STEMI       Objective:   Wts  01/01/2022       ***   10/09/2021       228   09/29/20 245 lb 0.6 oz (111.1 kg)  03/11/20 233 lb (105.7 kg)  06/04/19 235 lb (106.6 kg)    Vital signs reviewed  01/01/2022  - Note at rest 02 sats  ***% on ***   General appearance:    ***  Mod bar***               Assessment

## 2022-04-18 ENCOUNTER — Ambulatory Visit: Payer: Medicare Other | Admitting: Cardiology
